# Patient Record
Sex: Male | Born: 1960 | Race: White | Hispanic: No | Smoking: Current every day smoker
Health system: Southern US, Community
[De-identification: ages and names within clinical notes are randomized; demographics above are authoritative.]

## PROBLEM LIST (undated history)

## (undated) DIAGNOSIS — Z923 Personal history of irradiation: Secondary | ICD-10-CM

## (undated) DIAGNOSIS — S62609A Fracture of unspecified phalanx of unspecified finger, initial encounter for closed fracture: Secondary | ICD-10-CM

## (undated) DIAGNOSIS — E785 Hyperlipidemia, unspecified: Secondary | ICD-10-CM

## (undated) DIAGNOSIS — F209 Schizophrenia, unspecified: Secondary | ICD-10-CM

## (undated) DIAGNOSIS — R569 Unspecified convulsions: Secondary | ICD-10-CM

## (undated) DIAGNOSIS — F319 Bipolar disorder, unspecified: Secondary | ICD-10-CM

## (undated) DIAGNOSIS — I2510 Atherosclerotic heart disease of native coronary artery without angina pectoris: Secondary | ICD-10-CM

## (undated) DIAGNOSIS — I10 Essential (primary) hypertension: Secondary | ICD-10-CM

## (undated) DIAGNOSIS — F329 Major depressive disorder, single episode, unspecified: Secondary | ICD-10-CM

## (undated) DIAGNOSIS — F1011 Alcohol abuse, in remission: Secondary | ICD-10-CM

## (undated) DIAGNOSIS — F419 Anxiety disorder, unspecified: Secondary | ICD-10-CM

## (undated) DIAGNOSIS — B1920 Unspecified viral hepatitis C without hepatic coma: Secondary | ICD-10-CM

## (undated) DIAGNOSIS — Z720 Tobacco use: Secondary | ICD-10-CM

## (undated) DIAGNOSIS — I255 Ischemic cardiomyopathy: Secondary | ICD-10-CM

## (undated) DIAGNOSIS — F32A Depression, unspecified: Secondary | ICD-10-CM

## (undated) DIAGNOSIS — I739 Peripheral vascular disease, unspecified: Secondary | ICD-10-CM

## (undated) DIAGNOSIS — R350 Frequency of micturition: Secondary | ICD-10-CM

## (undated) DIAGNOSIS — I219 Acute myocardial infarction, unspecified: Secondary | ICD-10-CM

## (undated) DIAGNOSIS — K219 Gastro-esophageal reflux disease without esophagitis: Secondary | ICD-10-CM

## (undated) HISTORY — PX: BACK SURGERY: SHX140

## (undated) HISTORY — DX: Ischemic cardiomyopathy: I25.5

## (undated) HISTORY — DX: Hyperlipidemia, unspecified: E78.5

## (undated) HISTORY — DX: Atherosclerotic heart disease of native coronary artery without angina pectoris: I25.10

## (undated) HISTORY — PX: CARDIAC CATHETERIZATION: SHX172

## (undated) HISTORY — DX: Fracture of unspecified phalanx of unspecified finger, initial encounter for closed fracture: S62.609A

## (undated) HISTORY — DX: Peripheral vascular disease, unspecified: I73.9

## (undated) HISTORY — DX: Essential (primary) hypertension: I10

## (undated) HISTORY — PX: SUBDURAL HEMATOMA EVACUATION VIA CRANIOTOMY: SUR319

## (undated) HISTORY — PX: HERNIA REPAIR: SHX51

## (undated) NOTE — *Deleted (*Deleted)
[name] presents for follow up of radiation to his Larynx completed on 01/24/2018  Pain issues, if any: *** Using a feeding tube?: N/A Weight changes, if any:  **3 weight** Swallowing issues, if any: *** Smoking or chewing tobacco? *** Using fluoride trays daily? *** Last ENT visit was on: 02/02/2020 Saw Dr. Osborn Coho: "Impression & Plans:  Patient presents for follow-up evaluation and laryngoscopy. 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 Patient has been stable. Flexible laryngoscopy performed today shows normal bilateral vocal cord mobility, no evidence of nodule, mass or tumor. Mild diffuse erythema of the larynx. No evidence of recurrent carcinoma. Patient stable, 2 years out from radiation therapy. Recommend 1 year follow-up for routine recheck. We reviewed symptoms and findings associated with recurrent cancer and the patient will monitor clinically. Plan follow-up with Dr. Basilio Cairo as scheduled."  Other notable issues, if any: ***  **vitals**

---

## 1987-12-05 HISTORY — PX: BRAIN SURGERY: SHX531

## 2004-01-24 ENCOUNTER — Other Ambulatory Visit: Payer: Self-pay

## 2004-02-01 ENCOUNTER — Other Ambulatory Visit: Payer: Self-pay

## 2004-02-13 ENCOUNTER — Other Ambulatory Visit: Payer: Self-pay

## 2004-05-01 ENCOUNTER — Other Ambulatory Visit: Payer: Self-pay

## 2004-05-14 ENCOUNTER — Other Ambulatory Visit: Payer: Self-pay

## 2004-05-23 ENCOUNTER — Other Ambulatory Visit: Payer: Self-pay

## 2004-06-13 ENCOUNTER — Other Ambulatory Visit: Payer: Self-pay

## 2004-07-18 ENCOUNTER — Other Ambulatory Visit: Payer: Self-pay

## 2004-07-29 ENCOUNTER — Other Ambulatory Visit: Payer: Self-pay

## 2004-07-30 ENCOUNTER — Other Ambulatory Visit: Payer: Self-pay

## 2004-07-31 ENCOUNTER — Other Ambulatory Visit: Payer: Self-pay

## 2004-08-01 ENCOUNTER — Other Ambulatory Visit: Payer: Self-pay

## 2006-08-10 ENCOUNTER — Inpatient Hospital Stay (HOSPITAL_COMMUNITY): Admission: RE | Admit: 2006-08-10 | Discharge: 2006-08-14 | Payer: Self-pay | Admitting: Cardiology

## 2006-09-25 ENCOUNTER — Other Ambulatory Visit: Payer: Self-pay

## 2006-09-26 ENCOUNTER — Observation Stay: Payer: Self-pay | Admitting: Internal Medicine

## 2006-11-04 ENCOUNTER — Inpatient Hospital Stay: Payer: Self-pay | Admitting: Internal Medicine

## 2006-11-04 ENCOUNTER — Other Ambulatory Visit: Payer: Self-pay

## 2006-11-18 ENCOUNTER — Other Ambulatory Visit: Payer: Self-pay

## 2006-11-19 ENCOUNTER — Inpatient Hospital Stay: Payer: Self-pay | Admitting: Internal Medicine

## 2007-03-05 ENCOUNTER — Inpatient Hospital Stay (HOSPITAL_COMMUNITY): Admission: RE | Admit: 2007-03-05 | Discharge: 2007-03-06 | Payer: Self-pay | Admitting: Cardiovascular Disease

## 2007-03-05 HISTORY — PX: CORONARY ANGIOPLASTY WITH STENT PLACEMENT: SHX49

## 2007-04-27 ENCOUNTER — Other Ambulatory Visit: Payer: Self-pay

## 2007-04-27 ENCOUNTER — Inpatient Hospital Stay: Payer: Self-pay | Admitting: Internal Medicine

## 2007-04-28 ENCOUNTER — Other Ambulatory Visit: Payer: Self-pay

## 2007-07-17 ENCOUNTER — Ambulatory Visit: Payer: Self-pay

## 2007-07-31 ENCOUNTER — Other Ambulatory Visit: Payer: Self-pay

## 2007-08-01 ENCOUNTER — Observation Stay: Payer: Self-pay | Admitting: Internal Medicine

## 2007-08-16 ENCOUNTER — Ambulatory Visit: Payer: Self-pay | Admitting: Family Medicine

## 2007-09-19 ENCOUNTER — Ambulatory Visit (HOSPITAL_COMMUNITY): Admission: RE | Admit: 2007-09-19 | Discharge: 2007-09-19 | Payer: Self-pay | Admitting: Cardiovascular Disease

## 2007-09-24 ENCOUNTER — Observation Stay (HOSPITAL_COMMUNITY): Admission: RE | Admit: 2007-09-24 | Discharge: 2007-09-25 | Payer: Self-pay | Admitting: Cardiovascular Disease

## 2007-10-18 ENCOUNTER — Emergency Department: Payer: Self-pay | Admitting: Emergency Medicine

## 2007-10-18 ENCOUNTER — Other Ambulatory Visit: Payer: Self-pay

## 2008-01-02 ENCOUNTER — Other Ambulatory Visit: Payer: Self-pay

## 2008-01-02 ENCOUNTER — Emergency Department: Payer: Self-pay | Admitting: Emergency Medicine

## 2008-04-15 ENCOUNTER — Other Ambulatory Visit: Payer: Self-pay

## 2008-04-15 ENCOUNTER — Emergency Department: Payer: Self-pay | Admitting: Emergency Medicine

## 2009-04-14 ENCOUNTER — Encounter: Admission: RE | Admit: 2009-04-14 | Discharge: 2009-04-14 | Payer: Self-pay | Admitting: Cardiovascular Disease

## 2009-04-20 ENCOUNTER — Ambulatory Visit (HOSPITAL_COMMUNITY): Admission: RE | Admit: 2009-04-20 | Discharge: 2009-04-21 | Payer: Self-pay | Admitting: Cardiovascular Disease

## 2009-11-22 ENCOUNTER — Encounter: Payer: Self-pay | Admitting: Internal Medicine

## 2009-12-04 ENCOUNTER — Encounter: Payer: Self-pay | Admitting: Internal Medicine

## 2009-12-13 ENCOUNTER — Encounter: Payer: Self-pay | Admitting: Cardiovascular Disease

## 2010-01-04 ENCOUNTER — Encounter: Payer: Self-pay | Admitting: Internal Medicine

## 2010-03-18 ENCOUNTER — Encounter: Payer: Self-pay | Admitting: Cardiovascular Disease

## 2010-03-28 ENCOUNTER — Encounter: Payer: Self-pay | Admitting: Cardiovascular Disease

## 2010-05-06 ENCOUNTER — Ambulatory Visit: Payer: Self-pay | Admitting: Cardiovascular Disease

## 2010-05-06 ENCOUNTER — Ambulatory Visit: Payer: Self-pay | Admitting: Internal Medicine

## 2010-05-06 DIAGNOSIS — I2510 Atherosclerotic heart disease of native coronary artery without angina pectoris: Secondary | ICD-10-CM | POA: Insufficient documentation

## 2010-05-06 DIAGNOSIS — I658 Occlusion and stenosis of other precerebral arteries: Secondary | ICD-10-CM | POA: Insufficient documentation

## 2010-05-06 DIAGNOSIS — F17200 Nicotine dependence, unspecified, uncomplicated: Secondary | ICD-10-CM | POA: Insufficient documentation

## 2010-05-06 DIAGNOSIS — E785 Hyperlipidemia, unspecified: Secondary | ICD-10-CM | POA: Insufficient documentation

## 2010-05-09 ENCOUNTER — Encounter: Payer: Self-pay | Admitting: Cardiovascular Disease

## 2010-05-11 ENCOUNTER — Ambulatory Visit: Payer: Self-pay | Admitting: Gastroenterology

## 2010-05-13 ENCOUNTER — Ambulatory Visit: Payer: Self-pay | Admitting: Internal Medicine

## 2010-06-03 ENCOUNTER — Ambulatory Visit (HOSPITAL_COMMUNITY): Admission: RE | Admit: 2010-06-03 | Discharge: 2010-06-04 | Payer: Self-pay | Admitting: Neurosurgery

## 2010-06-15 ENCOUNTER — Ambulatory Visit: Payer: Self-pay | Admitting: Cardiovascular Disease

## 2010-06-15 DIAGNOSIS — R0602 Shortness of breath: Secondary | ICD-10-CM | POA: Insufficient documentation

## 2010-06-16 ENCOUNTER — Ambulatory Visit: Payer: Self-pay | Admitting: Vascular Surgery

## 2010-06-16 ENCOUNTER — Encounter: Payer: Self-pay | Admitting: Cardiovascular Disease

## 2010-08-10 ENCOUNTER — Ambulatory Visit: Payer: Self-pay | Admitting: Gastroenterology

## 2010-10-13 ENCOUNTER — Ambulatory Visit: Payer: Self-pay | Admitting: Neurosurgery

## 2010-11-24 ENCOUNTER — Ambulatory Visit: Payer: Self-pay | Admitting: Neurosurgery

## 2010-12-04 HISTORY — PX: POSTERIOR FUSION CERVICAL SPINE: SUR628

## 2010-12-19 ENCOUNTER — Encounter: Payer: Self-pay | Admitting: Cardiovascular Disease

## 2010-12-19 ENCOUNTER — Ambulatory Visit
Admission: RE | Admit: 2010-12-19 | Discharge: 2010-12-19 | Payer: Self-pay | Source: Home / Self Care | Attending: Cardiovascular Disease | Admitting: Cardiovascular Disease

## 2010-12-19 DIAGNOSIS — R0989 Other specified symptoms and signs involving the circulatory and respiratory systems: Secondary | ICD-10-CM | POA: Insufficient documentation

## 2010-12-21 ENCOUNTER — Ambulatory Visit (HOSPITAL_COMMUNITY)
Admission: RE | Admit: 2010-12-21 | Discharge: 2010-12-22 | Payer: Self-pay | Source: Home / Self Care | Attending: Neurosurgery | Admitting: Neurosurgery

## 2010-12-21 LAB — CBC
HCT: 46.4 % (ref 39.0–52.0)
Hemoglobin: 15.8 g/dL (ref 13.0–17.0)
MCH: 31.6 pg (ref 26.0–34.0)
MCHC: 34.1 g/dL (ref 30.0–36.0)
MCV: 92.8 fL (ref 78.0–100.0)
Platelets: 161 10*3/uL (ref 150–400)
RBC: 5 MIL/uL (ref 4.22–5.81)
RDW: 13.8 % (ref 11.5–15.5)
WBC: 8.4 10*3/uL (ref 4.0–10.5)

## 2010-12-21 LAB — COMPREHENSIVE METABOLIC PANEL
ALT: 33 U/L (ref 0–53)
AST: 29 U/L (ref 0–37)
Albumin: 4 g/dL (ref 3.5–5.2)
Alkaline Phosphatase: 53 U/L (ref 39–117)
BUN: 10 mg/dL (ref 6–23)
CO2: 29 mEq/L (ref 19–32)
Calcium: 9.7 mg/dL (ref 8.4–10.5)
Chloride: 105 mEq/L (ref 96–112)
Creatinine, Ser: 0.99 mg/dL (ref 0.4–1.5)
GFR calc Af Amer: >60 mL/min (ref >60)
GFR calc non Af Amer: >60 mL/min (ref >60)
Glucose, Bld: 84 mg/dL (ref 70–99)
Potassium: 4.5 mEq/L (ref 3.5–5.1)
Sodium: 141 mEq/L (ref 135–145)
Total Bilirubin: 0.8 mg/dL (ref 0.3–1.2)
Total Protein: 6.9 g/dL (ref 6.0–8.3)

## 2010-12-21 LAB — SURGICAL PCR SCREEN
MRSA, PCR: NEGATIVE
Staphylococcus aureus: NEGATIVE

## 2010-12-26 LAB — APTT: aPTT: 28 seconds (ref 24–37)

## 2010-12-26 LAB — PROTIME-INR
INR: 0.91 (ref 0.00–1.49)
Prothrombin Time: 12.5 s (ref 11.6–15.2)

## 2011-01-04 HISTORY — PX: ANTERIOR CERVICAL DECOMP/DISCECTOMY FUSION: SHX1161

## 2011-01-05 NOTE — Assessment & Plan Note (Signed)
Summary: F/U 6 MONTH   Visit Type:  Follow-up Primary Provider:  Dr. Darrick Huntsman  CC:  C/o chest pain and SOB. pt having back surgery tomorrow at [facility].Marland Kitchen  History of Present Illness: 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   he reports that he is doing well and his scheduled to have back surgery this week. He has already stopped his aspirin and Plavix. He denies any claudication type symptoms in his legs. He does have rare episodes of chest discomfort that have been chronic, lasting 20 minutes to 30 minutes, 4/10 in intensity, sometimes takes nitroglycerin. There's been no change over the past 2 years in his chest discomfort which has been chronic. Previous stress test showed region of scar but no ischemia.  Notes from  January 2011 suggest ABIs of 0.9 bilaterally suggesting patent stents of his iliac arteries.  EKG shows normal sinus rhythm with a rate 57 beats per minute, T-wave abnormality in V4 through V6, one and aVL  Stress test January 2010 shows moderate to severe perfusion defect consistent with scar with mild peri-infarct ischemia in the inferior wall as well as inferolateral region. This was unchanged from previous stress test in October 2008  Cardiac catheterization May 2010 showing occluded left circumflex at the ostium, right coronary artery was dominant with 30% in-stent restenosis of the mid RCA stent. LAD was normal.  Current Medications (verified): 1)  Depakote 500 Mg Tbec (Divalproex Sodium) .... Take 1 Tablet By Mouth 4 Times A Daily 2)  Lithium Carbonate 450 Mg Cr-Tabs (Lithium  Carbonate) .... Take 1 Tablet By Mouth Two Times A Day 3)  Alprazolam 1 Mg Tabs (Alprazolam) .... Take 1 Tablet By Mouth Four Times Daily 4)  Celexa 40 Mg Tabs (Citalopram Hydrobromide) .... Take 1 Tablet By Mouth Once Daily 5)  Plavix 75 Mg Tabs (Clopidogrel Bisulfate) .... Take 1 Tablet By Mouth Once Daily 6)  Amlodipine Besylate 5 Mg Tabs (Amlodipine Besylate) .... Take 1 Tablet By Mouth Once Daily 7)  Protonix 40 Mg Tbec (Pantoprazole Sodium) .... Take 1 Tablet By Mouth Once Daily 8)  Adderall 30 Mg Tabs (Amphetamine-Dextroamphetamine) .... Once Daily 9)  Crestor 10 Mg Tabs (Rosuvastatin Calcium) .... Take One Tablet By Mouth Daily. 10)  Seroquel 400 Mg Tabs (Quetiapine Fumarate) .Marland Kitchen.. 1 Tablet At Bedtime  Allergies (verified): No Known Drug Allergies  Past History:  Past Medical History: Last updated: 05/06/2010 CAD PAD HTN Hyperlipidemia  Past Surgical History: Last updated: 05/06/2010 stent--12 subdural hematoma  Family History: Last updated: 05/06/2010 emphysema-Father Family History of Alcoholism:  Family History of Diabetes:  Family History of Hyperlipidemia:  Family History of Hypertension:  Family History of Seizure Disorder:   Social History: Last updated: 05/06/2010 Disabled --17 years Bipolar/ADHD Divorced -- Tobacco Use - Yes.  Alcohol Use - no Regular Exercise - yes Drug Use - no  Risk Factors: Exercise: yes (05/06/2010)  Risk Factors: Smoking Status: current (05/06/2010)  Review of Systems       The patient complains of chest pain.  The patient denies fever, weight loss, weight gain, vision loss, decreased hearing, hoarseness, syncope, dyspnea on exertion, peripheral edema,  prolonged cough, abdominal pain, incontinence, muscle weakness, depression, and enlarged lymph nodes.    Vital Signs:  Patient profile:   50 year old male Height:      67 inches Weight:      167 pounds BMI:     26.25 Pulse rate:   57 / minute BP sitting:   134 / 84   (left arm) Cuff size:   regular  Vitals Entered By: Lysbeth Galas CMA (December 19, 2010 11:11 AM)  Physical Exam  General:  Well developed, well nourished, in no acute distress. Head:  normocephalic and atraumatic Neck:  Neck supple, no JVD. No masses, thyromegaly or abnormal cervical nodes. Lungs:  mildly decreased breath sounds throughout bilaterally Heart:  Non-displaced PMI, chest non-tender; regular rate and rhythm, S1, S2 without murmurs, rubs or gallops. Carotid upstroke normal, no bruit.  Pedals pulses were nonpalpable. No edema, no varicosities. Abdomen:  Bowel sounds positive; abdomen soft and non-tender without masses Msk:  Back normal, normal gait. Muscle strength and tone normal. Pulses:  decreased  pulses of the lower extremities, DP and PT Extremities:  No clubbing or cyanosis. Neurologic:  Alert and oriented x 3. Skin:  Intact without lesions or rashes. Psych:  Normal affect. appears to be somewhat shaky.    Impression & Recommendations:  Problem # 1:  CAD, NATIVE VESSEL (ICD-414.01) severe native coronary artery disease with chronic chest pain. We'll continue to treat medically at this time. We've asked him to contact us if his chest pain gets more frequent or more intense.  The following medications were removed from the medication list:    Aspirin 325 Mg Tabs (Aspirin) .Marland Kitchen... Take 1 tablet by mouth once daily His updated medication list for this problem includes:    Plavix 75 Mg Tabs (Clopidogrel bisulfate) .Marland Kitchen... Take 1 tablet by mouth once daily    Amlodipine Besylate 5 Mg Tabs (Amlodipine besylate) .Marland Kitchen... Take 1 tablet by mouth once daily    Aspirin 81 Mg Tbec (Aspirin) .Marland Kitchen... Take one tablet by mouth daily  Orders: EKG w/ Interpretation (93000)  Problem # 2:  TOBACCO USER (ICD-305.1) Continues to smoke one pack per day. We have counseled him on smoking cessation. He'll continue to try  Problem # 3:  HYPERLIPIDEMIA-MIXED (ICD-272.4) continue Crestor 10 mg  daily. We will check his cholesterol after his back surgery in March.  His updated medication list for this problem includes:    Crestor 10 Mg Tabs (Rosuvastatin calcium) .Marland Kitchen... Take one tablet by mouth daily.  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 We'll set him up for a ultrasound to evaluate his stent patency, has moderate renal arterial disease, carotid bruit. Continue aspirin Plavix and cholesterol management. Smoking cessation.  The following medications were removed from the medication list:    Aspirin 325 Mg Tabs (Aspirin) .Marland Kitchen... Take 1 tablet by mouth once daily His updated medication list for this problem includes:    Plavix 75 Mg Tabs (Clopidogrel bisulfate) .Marland Kitchen... Take 1 tablet by mouth once daily    Aspirin 81 Mg Tbec (Aspirin) .Marland Kitchen... Take one tablet by mouth daily  Other Orders: Carotid Duplex (Carotid Duplex) Arterial Duplex Lower Extremity (Arterial Duplex Low) Renal Artery Duplex (Renal Artery Duplex)  Patient Instructions: 1)  Your physician recommends that you schedule a follow-up appointment in: 6 months 2)  Your physician has recommended you make the following change in your medication: START Aspirin 81mg  once  daily. 3)  To be scheduled 02/2011: Your physician has requested that you have a carotid duplex. This test is an ultrasound of the carotid arteries in your neck. It looks at blood flow through these arteries that supply the brain with blood. Allow one hour for this exam. There are no restrictions or special instructions. 4)  To be scheduled 02/2011:  Your physician has requested that you have a lower arterial duplex.  This test is an ultrasound of the arteries in the legs or arms.  It looks at arterial blood flow in the legs and arms.  Allow one hour for Lower and Upper Arterial scans. There are no restrictions or special instructions. 5)  To be scheduled 02/2011:   Your physician has requested that you have a renal artery duplex. During this test, an ultrasound is used to evaluate blood flow to the kidneys. Allow one hour for this exam. Do not eat after midnight the day before and avoid carbonated beverages. Take your medications as you usually do. Prescriptions: PROTONIX 40 MG TBEC (PANTOPRAZOLE SODIUM) Take 1 tablet by mouth once daily  #90 x 3   Entered by:   Lanny Hurst RN   Authorized by:   Dossie Arbour MD   Signed by:   Lanny Hurst RN on 12/19/2010   Method used:   Electronically to        [facility] [phone]* (retail)       [address]       [place], [place]  [address]       Ph: [phone]       Fax: [phone]   RxID:   [id number]

## 2011-01-05 NOTE — Assessment & Plan Note (Signed)
Summary: NP6   Visit Type:  Initial Consult   History of Present Illness: [name] is a 50 year old gentleman with a history of coronary artery disease, stent to his RCA in April 2008 with occluded circumflex, bilateral iliac stenting with last stent to his left iliac in May 2010, mild to moderate renal artery stenosis on the left, history of hepatitis C, long alcohol history currently in remission, lipoma disorder who continues to smoke, hypertension and hyperlipidemia who presents to establish care.  He states that he has had significant right hip and right low back discomfort. It hurts more when he walks. He is worried about a blockage in his leg. Notes from Dr. Allyson Sabal January 2011 suggest ABIs of 0.9 bilaterally suggesting patent stents of his iliac arteries.  EKG dated January 03, 2010 shows normal sinus rhythm, rate 72 beats per minute, nonspecific T wave abnormality in V5, V6.  Stress test January 2010 shows moderate to severe perfusion defect consistent with scar with mild peri-infarct ischemia in the inferior wall as well as inferolateral region. This was unchanged from previous stress test in October 2008  Cardiac catheterization May 2010 showing occluded left circumflex at the ostium, right coronary artery was dominant with 30% in-stent restenosis of the mid RCA stent. LAD was normal.  Preventive Screening-Counseling & Management  Alcohol-Tobacco     Smoking Status: current  Caffeine-Diet-Exercise     Does Patient Exercise: yes      Drug Use:  no.    Current Medications (verified): 1)  Depakote 500 Mg Tbec (Divalproex Sodium) .... Take 1 Tablet By Mouth 4 Times A Daily 2)  Lithium Carbonate 450 Mg Cr-Tabs (Lithium Carbonate) .... Take 1 Tablet By Mouth Two Times A Day 3)  Ambien 10 Mg Tabs (Zolpidem Tartrate) .... Take 1 Tablet By Mouth At Bedtime As Needed 4)  Alprazolam 1 Mg Tabs (Alprazolam) .... Take 1 Tablet By Mouth Four Times Daily 5)  Celexa 40 Mg Tabs  (Citalopram Hydrobromide) .... Take 1 Tablet By Mouth Once Daily 6)  K-Tabs 10 Meq Cr-Tabs (Potassium Chloride) .... Take 1 Tablet By Mouth Once Daily 7)  Plavix 75 Mg Tabs (Clopidogrel Bisulfate) .... Take 1 Tablet By Mouth Once Daily 8)  Aspirin 325 Mg Tabs (Aspirin) .... Take 1 Tablet By Mouth Once Daily 9)  Amlodipine Besylate 5 Mg Tabs (Amlodipine Besylate) .... Take 1 Tablet By Mouth Once Daily 10)  Protonix 40 Mg Tbec (Pantoprazole Sodium) .... Take 1 Tablet By Mouth Once Daily 11)  Adderall 30 Mg Tabs (Amphetamine-Dextroamphetamine) .... Once Daily 12)  Fish Oil 500 Mg Caps (Omega-3 Fatty Acids) .... Three Times A Day 13)  Vitamin D 1000 Unit  Tabs (Cholecalciferol) 14)  Crestor 10 Mg Tabs (Rosuvastatin Calcium) .... Take One Tablet By Mouth Daily.  Allergies (verified): No Known Drug Allergies  Past History:  Past Medical History: CAD PAD HTN Hyperlipidemia  Past Surgical History: stent--12 subdural hematoma  Family History: emphysema-Father Family History of Alcoholism:  Family History of Diabetes:  Family History of Hyperlipidemia:  Family History of Hypertension:  Family History of Seizure Disorder:   Social History: Disabled --17 years Bipolar/ADHD Divorced -- Tobacco Use - Yes.  Alcohol Use - no Regular Exercise - yes Drug Use - no Smoking Status:  current Does Patient Exercise:  yes Drug Use:  no  Review of Systems       The patient complains of difficulty walking.  The patient denies fever, weight loss, weight gain, vision loss, decreased hearing, hoarseness,  chest pain, syncope, dyspnea on exertion, peripheral edema, prolonged cough, abdominal pain, incontinence, muscle weakness, depression, and enlarged lymph nodes.         Right hip and low back pain  Vital Signs:  Patient profile:   50 year old male Height:      67 inches Weight:      161 pounds BMI:     25.31 Pulse rate:   60 / minute BP sitting:   122 / 79  (left arm) Cuff size:    regular  Vitals Entered By: Bishop Dublin, CMA (May 06, 2010 2:42 PM)  Physical Exam  General:  Well developed, well nourished, in no acute distress. Head:  normocephalic and atraumatic Neck:  Neck supple, no JVD. No masses, thyromegaly or abnormal cervical nodes. Chest Wall:  no deformities or breast masses noted Lungs:  Clear bilaterally to auscultation and percussion. Heart:  Non-displaced PMI, chest non-tender; regular rate and rhythm, S1, S2 without murmurs, rubs or gallops. Carotid upstroke normal, no bruit. Normal abdominal aortic size, no bruits. Femorals normal pulses, no bruits. Pedals normal pulses. No edema, no varicosities. Abdomen:  Bowel sounds positive; abdomen soft and non-tender without masses, organomegaly, or hernias noted. No hepatosplenomegaly. Msk:  Back normal, normal gait. Muscle strength and tone normal. Pulses:  pulses normal in all 4 extremities Extremities:  No clubbing or cyanosis. Neurologic:  Alert and oriented x 3. Skin:  Intact without lesions or rashes. Psych:  Normal affect.   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 He also has mild to moderate renal artery stenosis. He is back on Crestor for lipid management as well as aspirin and Plavix.  He does have pain in his right hip though this seems more consistent with arthritic pain or sciatica.  His updated medication list for this problem includes:    Plavix 75 Mg Tabs (Clopidogrel bisulfate) .Marland Kitchen... Take 1 tablet by mouth once daily    Aspirin 325 Mg Tabs (Aspirin) .Marland Kitchen... Take 1 tablet by mouth once daily  His updated medication list for this problem includes:    Plavix 75 Mg Tabs (Clopidogrel bisulfate) .Marland Kitchen... Take 1 tablet by mouth once daily    Aspirin 325 Mg Tabs (Aspirin) .Marland Kitchen... Take 1 tablet by  mouth once daily  Problem # 2:  TOBACCO USER (ICD-305.1) We have counseled him for more than 5 minutes on smoking cessation as this will accelerate his peripheral vascular disease as well as his coronary artery disease. He has tried patches as well as other modalities.  Problem # 3:  CAD, NATIVE VESSEL (ICD-414.01) last stress test in 2010 showing no significant ischemia. He denies any significant chest pain though in the past he has had chronic episodes of chest discomfort.  His updated medication list for this problem includes:    Plavix 75 Mg Tabs (Clopidogrel bisulfate) .Marland Kitchen... Take 1 tablet by mouth once daily    Aspirin 325 Mg Tabs (Aspirin) .Marland Kitchen... Take 1 tablet by mouth once daily    Amlodipine Besylate 5 Mg Tabs (Amlodipine besylate) .Marland Kitchen... Take 1 tablet by mouth once daily  Problem # 4:  HYPERLIPIDEMIA-MIXED (ICD-272.4) He has a history of alcohol abuse, hepatitis C. We'll try to obtain his most recent lipid panel and LFTs. I asked him to talk with Dr. to low about whether he might be a candidate for  treatment for hepatitis C. There is a new medication out this year that he could potentially benefit from with a shorter treatment duration.  His updated medication list for this problem includes:    Crestor 10 Mg Tabs (Rosuvastatin calcium) .Marland Kitchen... Take one tablet by mouth daily.  Patient Instructions: 1)  Your physician wants you to follow-up in:   6 months  You will receive a reminder letter in the mail two months in advance. If you don't receive a letter, please call our office to schedule the follow-up appointment. 2)  Your physician recommends that you continue on your current medications as directed. Please refer to the Current Medication list given to you today.  Appended Document: NP6 labs from April 2011 shows total cholesterol 145, HDL 43, LDL 77 creatinine 1.1

## 2011-01-05 NOTE — Assessment & Plan Note (Signed)
Summary: ROV   Visit Type:  Follow-up Primary Provider:  Dr. Darrick Huntsman  CC:  Had back surg. on July 1 and 2011.  Has a burning sensation in left foot with discoloration in toes. Has some shortness of breath..  History of Present Illness: 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  He reports that 5 days ago, he acutely developed discomfort in 3 of his toes. This was on Saturday. On Sunday, his large toe also developed pain. He noticed discoloration of the distal part of the toe and he believes they felt cold. He also has had some shortness of breath. He had recent neck surgery 12 days ago and he reports that they gave him some IV fluids at the time. Since that time his breathing has been more labored. He has chronic right hip and right low back discomfort. It hurts more when he walks.   Notes from Dr. Allyson Sabal January 2011 suggest ABIs of 0.9 bilaterally suggesting patent stents of his iliac arteries.  EKG dated January 03, 2010 shows normal sinus rhythm, rate 72 beats per minute, nonspecific T wave abnormality in V5, V6.  Stress test January 2010 shows moderate to severe perfusion defect consistent with scar with mild peri-infarct ischemia in the inferior wall as well as inferolateral region. This was unchanged from previous stress test in October 2008  Cardiac catheterization May 2010 showing occluded left circumflex at the ostium, right coronary artery was dominant with 30% in-stent restenosis of the mid RCA stent. LAD was normal.  Current Medications (verified): 1)  Depakote 500 Mg Tbec (Divalproex Sodium)  .... Take 1 Tablet By Mouth 4 Times A Daily 2)  Lithium Carbonate 450 Mg Cr-Tabs (Lithium Carbonate) .... Take 1 Tablet By Mouth Two Times A Day 3)  Ambien 10 Mg Tabs (Zolpidem Tartrate) .... Take 1 Tablet By Mouth At Bedtime As Needed 4)  Alprazolam 1 Mg Tabs (Alprazolam) .... Take 1 Tablet By Mouth Four Times Daily 5)  Celexa 40 Mg Tabs (Citalopram Hydrobromide) .... Take 1 Tablet By Mouth Once Daily 6)  K-Tabs 10 Meq Cr-Tabs (Potassium Chloride) .... Take 1 Tablet By Mouth Once Daily 7)  Plavix 75 Mg Tabs (Clopidogrel Bisulfate) .... Take 1 Tablet By Mouth Once Daily 8)  Aspirin 325 Mg Tabs (Aspirin) .... Take 1 Tablet By Mouth Once Daily 9)  Amlodipine Besylate 5 Mg Tabs (Amlodipine Besylate) .... Take 1 Tablet By Mouth Once Daily 10)  Protonix 40 Mg Tbec (Pantoprazole Sodium) .... Take 1 Tablet By Mouth Once Daily 11)  Adderall 30 Mg Tabs (Amphetamine-Dextroamphetamine) .... Once Daily 12)  Fish Oil 500 Mg Caps (Omega-3 Fatty Acids) .... Three Times A Day 13)  Vitamin D 1000 Unit  Tabs (Cholecalciferol) 14)  Crestor 10 Mg Tabs (Rosuvastatin Calcium) .... Take One Tablet By Mouth Daily. 15)  Valium 5 Mg Tabs (Diazepam) .Marland Kitchen.. 1 Tablet Bid 16)  Hydrocodone-Acetaminophen 7.5-500 Mg Tabs (Hydrocodone-Acetaminophen) .Marland Kitchen.. 1 Tablet Every 4-6 Hours As Needed  Allergies (verified): No Known Drug Allergies  Past History:  Past Medical History: Last updated: 05/06/2010 CAD PAD HTN Hyperlipidemia  Past Surgical History: Last updated: 05/06/2010 stent--12 subdural hematoma  Family History: Last updated: 05/06/2010 emphysema-Father Family History of Alcoholism:  Family History of Diabetes:  Family History of Hyperlipidemia:  Family History of Hypertension:  Family History of Seizure Disorder:   Social History: Last updated: 05/06/2010 Disabled --17 years Bipolar/ADHD Divorced -- Tobacco Use - Yes.  Alcohol Use - no Regular Exercise - yes Drug Use - no  Risk  Factors: Exercise: yes (05/06/2010)  Risk Factors: Smoking Status: current (05/06/2010)  Review of Systems       The patient complains of dyspnea on exertion.  The patient denies fever, weight loss, weight gain, vision loss, decreased hearing, hoarseness, chest pain, syncope, peripheral edema, prolonged cough, abdominal pain, incontinence, muscle weakness, depression, and enlarged lymph nodes.         "purple cold toes on left foot"  Vital Signs:  Patient profile:   50 year old male Height:      67 inches Weight:      157 pounds BMI:     24.68 Pulse rate:   63 / minute BP sitting:   122 / 80  (left arm) Cuff size:   regular  Vitals Entered By: Bishop Dublin, CMA (June 15, 2010 11:28 AM)  Physical Exam  General:  Well developed, well nourished, in no acute distress. Head:  normocephalic and atraumatic Neck:  Neck supple, no JVD. No masses, thyromegaly or abnormal cervical nodes. Lungs:  mildly decreased breath sounds throughout bilaterally Heart:  Non-displaced PMI, chest non-tender; regular rate and rhythm, S1, S2 without murmurs, rubs or gallops. Carotid upstroke normal, no bruit.  Pedals pulses were nonpalpable. No edema, no varicosities. Abdomen:  Bowel sounds positive; abdomen soft and non-tender without masses Msk:  Back normal, normal gait. Muscle strength and tone normal. Pulses:  decreased nonpalpable pulses of the lower extremities, DP and PT Extremities:  No clubbing. cyanosis noted of the first, third fourth fifth toes on the left the distal part of the toes.  Neurologic:  Alert and oriented x 3. Skin:  Intact without lesions or rashes. Psych:  Normal affect.   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 Acute onset 4-5 days ago with worsening pain. He did have neck surgery close to 2 weeks ago by suspect this is unrelated. My  suspicion is he has embolic occlusion of his toes but cannot rule out stent stenosis. He will be seen today at [facility] with an ultrasound followed by evaluation by Dr. Lorretta Harp.  He may require admission to the hospital with IV heparin.  His updated medication list for this problem includes:    Plavix 75 Mg Tabs (Clopidogrel bisulfate) .Marland Kitchen... Take 1 tablet by mouth once daily    Aspirin 325 Mg Tabs (Aspirin) .Marland Kitchen... Take 1 tablet by mouth once daily  Problem # 2:  DYSPNEA (ICD-786.05) I suspect that his shortness of breath is due to some fluid overload from his recent neck surgery. We will start him on Lasix to be taken p.r.n. until his bleeding improves.  His updated medication list for this problem includes:    Aspirin 325 Mg Tabs (Aspirin) .Marland Kitchen... Take 1 tablet by mouth once daily    Amlodipine Besylate 5 Mg Tabs (Amlodipine besylate) .Marland Kitchen... Take 1 tablet by mouth once daily  Problem # 3:  HYPERLIPIDEMIA-MIXED (ICD-272.4) We will continue him on the Crestor, aspirin, plavix  His updated medication list for this  problem includes:    Crestor 10 Mg Tabs (Rosuvastatin calcium) .Marland Kitchen... Take one tablet by mouth daily.  Other Orders: EKG w/ Interpretation (93000)  Patient Instructions: 1)  You are being referred to [facility] today. 2)  Your physician wants you to follow-up in: 6 months.   You will receive a reminder letter in the mail two months in advance. If you don't receive a letter, please call our office to schedule the follow-up appointment.  Appended Document: ROV EKG shows normal sinus rhythm with rate 63 beats per minute, nonspecific T-wave changes in V4 through V6, 2, 3 aVF

## 2011-01-05 NOTE — Miscellaneous (Signed)
Summary: Furosemide  Clinical Lists Changes  Medications: Added new medication of FUROSEMIDE 20 MG TABS (FUROSEMIDE) Take one tablet by mouth daily. - Signed Rx of FUROSEMIDE 20 MG TABS (FUROSEMIDE) Take one tablet by mouth daily.;  #30 x 1;  Signed;  Entered by: Sherri Rad, RN, BSN;  Authorized by: Dossie Arbour MD;  Method used: Electronically to [id number] [address] [place], [place], [place]  [address], Ph: [phone], Fax: [phone]    Prescriptions: FUROSEMIDE 20 MG TABS (FUROSEMIDE) Take one tablet by mouth daily.  #30 x 1   Entered by:   Sherri Rad, RN, BSN   Authorized by:   Dossie Arbour MD   Signed by:   Sherri Rad, RN, BSN on 06/15/2010   Method used:   Electronically to        [facility] [phone]* (retail)       [address]       [place], [place]  [address]       Ph: [phone]       Fax: [phone]   RxID:   [id number]

## 2011-01-05 NOTE — Letter (Signed)
Summary: Internal Correspondence  Internal Correspondence   Imported By: West Carbo 05/09/2010 12:19:34  _____________________________________________________________________  External Attachment:    Type:   Image     Comment:   External Document

## 2011-01-05 NOTE — Letter (Signed)
Summary: Medical Record Release  Medical Record Release   Imported By: Harlon Flor 04/13/2010 14:42:43  _____________________________________________________________________  External Attachment:    Type:   Image     Comment:   External Document

## 2011-01-05 NOTE — Op Note (Signed)
Summary: Operative Report  Operative Report   Imported By: West Carbo 06/20/2010 08:16:31  _____________________________________________________________________  External Attachment:    Type:   Image     Comment:   External Document

## 2011-01-05 NOTE — Letter (Signed)
Summary: MR RELEASE  MR RELEASE   Imported By: Frazier Butt Chriscoe 05/09/2010 09:48:27  _____________________________________________________________________  External Attachment:    Type:   Image     Comment:   External Document

## 2011-01-27 NOTE — Op Note (Signed)
  NAMEMONTELL, LEOPARD             ACCOUNT NO.:  [id number]  MEDICAL RECORD NO.:  [id number]          PATIENT TYPE:  OIB  LOCATION:  3021                         FACILITY:  [facility]  PHYSICIAN:  Coletta Memos, M.D.     DATE OF BIRTH:  1961-08-29  DATE OF PROCEDURE:  12/21/2010 DATE OF DISCHARGE:  12/22/2010                              OPERATIVE REPORT   PREOPERATIVE DIAGNOSES: 1. Cervical spondylosis, C6-7. 2. Left C7 radiculopathy.  POSTOPERATIVE DIAGNOSES: 1. Cervical spondylosis, C6-7. 2. Left C7 radiculopathy.  PROCEDURE:  Left C6-7 medial facetectomy and foraminotomy with microdissection.  COMPLICATIONS:  None.  SURGEON:  Coletta Memos, MD  INDICATIONS:  Ms. Kimball is an individual whom I performed an anterior cervical decompression and arthrodesis at C6-7.  He persisted with left upper extremity pain after that operation.  CT shows there is still some narrowing of the foramen on the left side.  I therefore offered and he agreed to undergo operative decompression.  OPERATIVE NOTE:  I took Ms. Maddy to the operating room.  He was positioned after anesthesia was obtained in a three-pin [place] head holder.  His neck was placed in a military tuck position.  I shaved the back of his head and neck, and then he was prepped and draped in sterile fashion.  I opened the skin with a #10 blade and took this down through the deep fascia to the deep cervical fascia to expose the spinous processes of two lamina.  I was able to confirm that that was the C6-7 space.  I then on the left side used a drill to perform a medial facetectomy.  I used also Kerrison punches and some curettes.  I removed the ligamentum flavum.  I exposed the nerve root and then completed a foraminotomy by removing bone overlying the nerve root until I felt that it was free of any and all pressure.  I then irrigated the wound.  I closed the wound in layered fashion using Vicryl sutures.  I used Dermabond  for sterile dressing.          ______________________________ Coletta Memos, M.D.     KC/MEDQ  D:  12/29/2010  T:  12/30/2010  Job:  [id number]  Electronically Signed by Coletta Memos M.D. on 01/27/2011 09:19:34 AM

## 2011-02-19 LAB — CBC
HCT: 47.6 % (ref 39.0–52.0)
Hemoglobin: 16.1 g/dL (ref 13.0–17.0)
MCH: 33.5 pg (ref 26.0–34.0)
MCHC: 33.9 g/dL (ref 30.0–36.0)
MCV: 98.8 fL (ref 78.0–100.0)
Platelets: 130 10*3/uL — ABNORMAL LOW (ref 150–400)
RBC: 4.82 MIL/uL (ref 4.22–5.81)
RDW: 14.9 % (ref 11.5–15.5)
WBC: 8.1 10*3/uL (ref 4.0–10.5)

## 2011-02-19 LAB — HEPATIC FUNCTION PANEL
ALT: 37 U/L (ref 0–53)
AST: 38 U/L — ABNORMAL HIGH (ref 0–37)
Albumin: 3.6 g/dL (ref 3.5–5.2)
Alkaline Phosphatase: 43 U/L (ref 39–117)
Bilirubin, Direct: 0.3 mg/dL (ref 0.0–0.3)
Indirect Bilirubin: 0.5 mg/dL (ref 0.3–0.9)
Total Bilirubin: 0.8 mg/dL (ref 0.3–1.2)
Total Protein: 6.5 g/dL (ref 6.0–8.3)

## 2011-02-19 LAB — PLATELET FUNCTION ASSAY: Collagen / Epinephrine: 171 seconds (ref 0–184)

## 2011-02-19 LAB — BASIC METABOLIC PANEL
BUN: 7 mg/dL (ref 6–23)
CO2: 29 mEq/L (ref 19–32)
Calcium: 9.6 mg/dL (ref 8.4–10.5)
Chloride: 103 mEq/L (ref 96–112)
Creatinine, Ser: 0.93 mg/dL (ref 0.4–1.5)
GFR calc Af Amer: >60 mL/min (ref >60)
GFR calc non Af Amer: >60 mL/min (ref >60)
Glucose, Bld: 103 mg/dL — ABNORMAL HIGH (ref 70–99)
Potassium: 4.5 mEq/L (ref 3.5–5.1)
Sodium: 136 mEq/L (ref 135–145)

## 2011-02-19 LAB — PLATELET INHIBITION P2Y12
P2Y12 % Inhibition: 16 %
Platelet Function  P2Y12: 221 [PRU] (ref 194–418)
Platelet Function Baseline: 262 [PRU] (ref 194–418)

## 2011-02-19 LAB — PROTIME-INR
INR: 0.97 (ref 0.00–1.49)
INR: 0.92 (ref 0.00–1.49)
Prothrombin Time: 12.8 seconds (ref 11.6–15.2)
Prothrombin Time: 12.3 seconds (ref 11.6–15.2)

## 2011-02-19 LAB — DIFFERENTIAL
Basophils Absolute: 0 10*3/uL (ref 0.0–0.1)
Basophils Relative: 0 % (ref 0–1)
Eosinophils Absolute: 0 10*3/uL (ref 0.0–0.7)
Eosinophils Relative: 1 % (ref 0–5)
Lymphocytes Relative: 22 % (ref 12–46)
Lymphs Abs: 1.8 10*3/uL (ref 0.7–4.0)
Monocytes Absolute: 0.6 10*3/uL (ref 0.1–1.0)
Monocytes Relative: 8 % (ref 3–12)
Neutro Abs: 5.6 10*3/uL (ref 1.7–7.7)
Neutrophils Relative %: 69 % (ref 43–77)

## 2011-02-19 LAB — SURGICAL PCR SCREEN
MRSA, PCR: NEGATIVE
Staphylococcus aureus: NEGATIVE

## 2011-02-19 LAB — APTT
aPTT: 28 seconds (ref 24–37)
aPTT: 26 seconds (ref 24–37)

## 2011-03-14 LAB — CBC
HCT: 42.7 % (ref 39.0–52.0)
Hemoglobin: 14.9 g/dL (ref 13.0–17.0)
MCHC: 34.9 g/dL (ref 30.0–36.0)
MCV: 100.8 fL — ABNORMAL HIGH (ref 78.0–100.0)
Platelets: 117 10*3/uL — ABNORMAL LOW (ref 150–400)
RBC: 4.23 MIL/uL (ref 4.22–5.81)
RDW: 13.4 % (ref 11.5–15.5)
WBC: 6.6 10*3/uL (ref 4.0–10.5)

## 2011-03-14 LAB — BASIC METABOLIC PANEL
BUN: 8 mg/dL (ref 6–23)
CO2: 27 mEq/L (ref 19–32)
Calcium: 9.3 mg/dL (ref 8.4–10.5)
Chloride: 109 mEq/L (ref 96–112)
Creatinine, Ser: 0.83 mg/dL (ref 0.4–1.5)
GFR calc Af Amer: >60 mL/min (ref >60)
GFR calc non Af Amer: >60 mL/min (ref >60)
Glucose, Bld: 102 mg/dL — ABNORMAL HIGH (ref 70–99)
Potassium: 4.2 mEq/L (ref 3.5–5.1)
Sodium: 141 mEq/L (ref 135–145)

## 2011-03-20 ENCOUNTER — Other Ambulatory Visit: Payer: Self-pay | Admitting: Emergency Medicine

## 2011-03-20 MED ORDER — CLOPIDOGREL BISULFATE 75 MG PO TABS: 75.0000 mg | ORAL_TABLET | Freq: Every day | ORAL | Status: DC

## 2011-04-11 ENCOUNTER — Encounter: Payer: Self-pay | Admitting: *Deleted

## 2011-04-18 NOTE — Discharge Summary (Signed)
NAMETAYTE, MCWHERTER             ACCOUNT NO.:  [id number]   MEDICAL RECORD NO.:  [id number]          PATIENT TYPE:  INP   LOCATION:  2508                         FACILITY:  [facility]   PHYSICIAN:  Nanetta Batty, M.D.   DATE OF BIRTH:  06-01-1961   DATE OF ADMISSION:  04/20/2009  DATE OF DISCHARGE:  04/21/2009                               DISCHARGE SUMMARY   DISCHARGE DIAGNOSES:  1. Lifestyle-limiting claudication.  2. Peripheral vascular disease with percutaneous transluminal      angioplasty and stent to the left common iliac artery and left      superficial femoral artery.  3. Thrombocytopenia with drop in platelets from 141 prior to procedure      to 117.  4. Sinus bradycardia with heart rate 49-50 at times, questionable      secondary to his psychiatric medications.  5. 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   DISCHARGE CONDITION:  Improved.   PROCEDURES:  PV angiogram by Dr. Nanetta Batty.   PTA and stent to the left CIA and left SFA on Apr 20, 2009.   Also on Apr 20, 2009 right and left heart cath by Dr. Nanetta Batty.   DISCHARGE MEDICATIONS:  1. Amlodipine 10 mg daily.  2. Ambien/zolpidem 10 mg 2 tablets at bedtime.  3. Lithium 450 mg 2 daily.  4. Plavix 75 mg daily.  5. Aspirin 325 mg daily.  6. Crestor 10 mg at night.  7. Seroquel XR 400 mg 2 tablets daily.  8. Stavzor 500 mg 4 tablets daily.  9. Xanax ER 3 mg daily.  10.Citalopram 30 mg daily.  11.Focalin 30 mg daily.   DISCHARGE INSTRUCTIONS:  1. Low-sodium heart-healthy diet.  2. Wash cath site with soap and water.  Call if any bleeding, swelling      or drainage.  3. Increase activity slowly.  May shower.  No lifting for 2 days.  No      driving for 2 days.  4. Follow with Dr. Allyson Sabal May 25, 2009 at 11:30 a.m.  5. Have lab work done Monday Apr 26, 2009.  6. Lower extremity arterial Dopplers at [facility] on May 20, 2009 at 4 p.m.   HISTORY OF PRESENT ILLNESS:  A 50 year old male with history of coronary  artery disease and peripheral vascular disease.  He has had MIs in the  past and stents at [facility] in 1997.  He also has bilateral iliac disease  and status post stent in the past with re-stenting of his left lower  extremity 2 years ago by Dr. Allyson Sabal.  Also at that time Dr. Allyson Sabal re-  stented his mid RCA.  He has a known occluded circumflex.  EF was 50%.  He does have inferior hypokinesia.   Over the last 6 months, the patient had increasing claudication.  He  continues with tobacco, has hypertension and hyperlipidemia.  His recent  Dopplers revealed increased velocities in the left common iliac from  361, now 468 and ABI that  went down from 0.86-0.73.  He also has  moderate left renal artery stenosis as well with renal aortic ratio of  3.6 on the left.   ALLERGIES:  LIPITOR.   Dr. Allyson Sabal brought him in and did a combined left heart cath which  revealed 30% in-stent restenosis in mid RCA and a 30% proximal stenosis.  His circumflex as we knew was occluded otherwise stable and coronary  anatomy with normal EF.  He proceeded on to do PV angiogram and the  patient had stenosis and received PTA and stent to the left CIA and left  SFA.  The patient tolerated the procedure well.   The next morning, platelets were down to 117, questionably due to  heparin during the procedure.  We will follow this as an outpatient.  His blood pressures borderline 93/55 to 116/61, pulse as well as  borderline at 57, at discharge it had dropped to 49 during the night. H  he had occasional PVCs, respirations 20, temperature 98.1, oxygen  saturation was 93% room air.  Heart regular rate and rhythm.  Lungs were  clear and groin was stable without hematoma.   The patient ambulated without problems.   We were unable to add a beta blocker for his coronary artery disease nor  ACE inhibitor due to his hypotension as well as his  sinus bradycardia.  We will have his cardiologist, Dr. Mariah Milling address that on office visit  perhaps to adjust the Norvasc to make way for  ACE inhibitor as well as  we will check liver studies as he had been an alcohol abuser in the past  to ensure that he does not have a liver cirrhosis or other issue causing  a drop in platelets.      Darcella Gasman. Annie Paras, N.P.      Nanetta Batty, M.D.  Electronically Signed    LRI/MEDQ  D:  04/21/2009  T:  04/22/2009  Job:  [id number]   cc:   [name], MD

## 2011-04-18 NOTE — Cardiovascular Report (Signed)
NAMERYNELL, CIOTTI NO.:  [id number]   MEDICAL RECORD NO.:  [id number]          PATIENT TYPE:  OBV   LOCATION:  6533                         FACILITY:  [facility]   PHYSICIAN:  Nanetta Batty, M.D.   DATE OF BIRTH:  1961-05-15   DATE OF PROCEDURE:  09/24/2007  DATE OF DISCHARGE:                            CARDIAC CATHETERIZATION   [name] is a 50 year old gentleman with a history of tobacco  abuse, hypertension and hyperlipidemia.  He has had circumflex and RCA  stenting in the past as well as bilateral iliac stenting.  He has  recently complained of increasing shortness of breath and chest pain and  had a positive Myoview.  He has also complained of increasing left lower  extremity claudication and follow-up Doppler suggested in-stent  restenosis within the left iliac stent.  He underwent a cardiac  catheterization this morning and presents now for peripheral angiography  and potential intervention.   DESCRIPTION OF PROCEDURE:  A 6-French pigtail catheter was used for  midstream and distal abdominal aortography.  Visipaque dye was used  entirety of the case.  Retrograde aortic pressure was monitored during  the case.   ANGIOGRAPHIC RESULTS:  1. Abdominal aorta      a.     Renal arteries:      b.     Infrarenal abdominal aorta:  Normal.  2. Left lower extremity:  A 70% in-stent restenosis within the      proximal portion of the left iliac stent with a 40 mm trans-      stenotic gradient noted after pullback was performed and intra-      arterial nitroglycerin was administered.  3. Right lower extremity:  The right iliac stent was widely patent.   The existing 6-French sheath was exchanged for a long Cordis bright-tip  6-French sheath over the wire.  The patient received 2500 units of  heparin intravenously.  A 7 x 18 Genesis on Opta balloon stent pre-mount  was then used to stent the proximal left common iliac artery in-stent  restenotic region.  This  was deployed at 8-10 atmospheres, resulting in  reduction of 70% lesion to 0% residual.  The patient tolerated the  procedure well.  The guidewire was removed.  The sheath was sewn  securely in place.  The patient left the lab in stable condition.  The  sheath will be  removed once the ACT falls below 200.  The patient was gently hydrated,  discharged home in the morning, and will get follow-up Dopplers and  ABIs, after which he will be seen back at the office in follow-up.  He  left the lab in stable condition.      Nanetta Batty, M.D.  Electronically Signed     JB/MEDQ  D:  09/24/2007  T:  09/24/2007  Job:  [id number]   cc:   Second Floor PV Angiographic Suite  [facility]  [facility]

## 2011-04-18 NOTE — Discharge Summary (Signed)
[name], [name]             ACCOUNT NO.:  [id number]   MEDICAL RECORD NO.:  [id number]          PATIENT TYPE:  OBV   LOCATION:  6533                         FACILITY:  [facility]   PHYSICIAN:  [name], MD   DATE OF BIRTH:  January 18, 1961   DATE OF ADMISSION:  09/24/2007  DATE OF DISCHARGE:  09/25/2007                               DISCHARGE SUMMARY   DISCHARGE DIAGNOSES:  1. Coronary disease with chest pain, patent right coronary artery      stent placed from September of 2007 and known occluded circumflex      with normal left anterior descending at catheterization, plan is to      continue medical therapy.  2. Claudication with peripheral vascular disease history.  Patient has      had bilateral iliac stents in September of 2007 with in-stent      restenosis on the left in April of 2008 and again this admission      treated with angioplasty.  3. Left ventricular dysfunction with ejection fraction of 35 to 40%.  4. Chronic obstructive pulmonary disease and smoking.  5. Bipolar disorder, patient is on lithium and Seroquel.  6. Treated hypertension.  7. Treated dyslipidemia.   HOSPITAL COURSE:  Patient is a 50 year old male with coronary disease  and vascular disease.  He has had several interventions in the past.  His last catheterization was in April of 2004.  At that time, he had  left iliac in-stent restenosis with PTA.  He presented to the office in  [place] with chest pain, dyspnea and claudication.  He was setup for  outpatient catheterization and peripheral angiogram.  This was done  September 24, 2007 by Dr. Allyson Sabal.  Catheterization revealed normal left  main, normal LAD, a large normal OM with totaled circumflex which is  old, the RCA had a 30% proximal narrowing and a patent mid-RCA stent.  EF was 35 to 40%.  Peripheral angiogram revealed a patent right iliac  stent and 70% in-stent restenosis on the left.  This was treated with  PTA and stent.  He tolerated this  well.  We feel he can be discharged  September 25, 2007.  At discharge, the patient did complain of recent  problems with erectile dysfunction.  We changed his Toprol to Bystolic.  He is to come to the office to get samples.   DISCHARGE MEDICATIONS:  1. Dilantin 400 mg in the morning.  2. Omeprazole 40 mg a day.  3. Lithium 300 mg three times a day.  4. Plavix 75 mg a day.  5. Metoprolol ER 50 mg a day has been stopped, he will now take      Bystolic 5 mg a day.  6. Nitroglycerin sublingual p.r.n.  7. Benicar 20 mg a day.  8. Seroquel 200 mg h.s.  9. Crestor 10 mg a day.  10.Bystolic 10mg  po qd   LABS:  Sodium 138, potassium 4.0, BUN 7, creatinine 0.7, white count  5.6, hemoglobin 11.2, hematocrit 35.1, platelets 164.   DISPOSITION:  Patient is discharged in stable condition.  He will follow  up  with Dr. Dossie Arbour in a couple weeks in [place].      [name], P.A.      [name], MD  Electronically Signed    LKK/MEDQ  D:  09/25/2007  T:  09/25/2007  Job:  [id number]

## 2011-04-18 NOTE — Cardiovascular Report (Signed)
NAMEDEMARUS, [name] NO.:  [id number]   MEDICAL RECORD NO.:  [id number]          PATIENT TYPE:  INP   LOCATION:  2508                         FACILITY:  [facility]   PHYSICIAN:  Nanetta Batty, M.D.   DATE OF BIRTH:  [birth year]   DATE OF PROCEDURE:  04/20/2009  DATE OF DISCHARGE:                            CARDIAC CATHETERIZATION   [name] is a 50 year old thin-appearing Caucasian male, history of  CAD and PVOD.  He has had MIs in the past and stents at [facility] in 1997.  He has bilateral iliac artery stenting in the past as well as restenting  in his lower extremity 2 years ago at which time I restented his mid  RCA.  He has a known occluded circumflex with a 50% EF and inferior  hypokinesia.  He has had progressively worsening left lower extremity  claudication as well as continued tobacco abuse with smoking 3 packs a  day down to 1 pack a day.  He has hypertension and hyperlipidemia as  well.  Dopplers performed recently showed decrease in his left ABI from  0.86-0.73 with increase in his velocities.  He has also complained of  left neck and shoulder pain similar to his prior heart symptoms.  He  presents now for diagnostic coronary arteriography with staged lower  extremity intervention.   DESCRIPTION OF PROCEDURE:  The patient was brought to the second floor  [name] lab in postabsorptive state.  He was  premedicated with p.o. Valium, IV Versed and fentanyl.  His left groin  was prepped and shaved in the usual sterile fashion.  Xylocaine 1% was  used for local anesthesia.  A 6-French sheath was inserted into the left  femoral artery using standard Seldinger technique.  A 6-French left  Judkins diagnostic catheter was used for selective cholangiography.  Left ventriculography was not performed.  Visipaque dye was used for the  entirety of the case.  Retrograde aortic pressures were monitored during  the case.   ANGIOGRAPHIC RESULTS:  1. Left  main normal.  2. LAD normal.  3. Left circumflex occluded at the ostium.  4. Right coronary artery was dominant with at most 30% in-stent      restenosis within the mid RCA stent.   IMPRESSION:  [name] has patent right coronary artery stent with  anatomy essentially unchanged for what has been described before.  I do  not think that his shoulder pain is related to his heart, especially  given recent Myoview performed 4 months ago that showed inferolateral  scar with very mild peri-infarct ischemia.  Continued medical therapy  will be recommended.      Nanetta Batty, M.D.  Electronically Signed    JB/MEDQ  D:  04/20/2009  T:  04/20/2009  Job:  [id number]   cc:   Second Floor [facility]  [facility]  [name], MD  [name], MD

## 2011-04-18 NOTE — Cardiovascular Report (Signed)
[name], [name]             ACCOUNT NO.:  [id number]   MEDICAL RECORD NO.:  [id number]          PATIENT TYPE:  AMB   LOCATION:  SDS                          FACILITY:  [facility]   PHYSICIAN:  Nanetta Batty, M.D.   DATE OF BIRTH:  09-24-1961   DATE OF PROCEDURE:  04/20/2009  DATE OF DISCHARGE:                            CARDIAC CATHETERIZATION   [name] is a 50 year old thin-appearing Caucasian male, history of  CAD and PVOD.  He has had the MIs in the past and stenting at [facility] in  1997.  He also had bilateral iliac artery stenting with restenting 2  years ago for in-stent restenosis.  I have also restented his RCA in  the past.  He has ongoing tobacco abuse, hypertension, and  hyperlipidemia.  He has had progressive left lower extremity  claudication with decrease in his left ABI and increase in his  velocities across the previously placed stents.  He presents now for  angiography and potential intervention.   DESCRIPTION OF PROCEDURE:  The patient was brought to the second floor  [facility] in postabsorptive state.  He was  premedicated with p.o. Valium, IV Versed and fentanyl.  His left groin  was prepped and shaved in the usual sterile fashion.  Xylocaine 1% was  used for local anesthesia.  A long 6-French sheath was inserted into  left femoral artery using standard Seldinger technique.  The patient had  already undergone coronary angiography.  A 5-French Tennis Racquet  catheter was used for midstream and distal abdominal aortography.  Visipaque dye was used for the entirety of the case.  Retrograde aortic  pressure was monitored during the case.   ANGIOGRAPHIC RESULTS:  1. Abdominal aorta:      a.     Renal artery - 40% left renal artery stenosis.      b.     Infrarenal abdominal aorta - normal.  2. Left lower extremity:      a.     70% in-stent restenosis within the left common iliac       artery stent with a significant pullback gradient  after       administration of intra-arterial nitroglycerin.      b.     60% left common femoral artery stenosis just after the       previously stented segment.  3. Right lower extremity.      a.     Patent right common and external iliac artery stents with at       most 40-50% in-stent restenosis at the junction with no pullback       gradient.   DESCRIPTION OF PROCEDURE:  The patient received 2500 units of heparin  intravenously.  The in-stent restenosis within the left common iliac  artery stent was stented with a 7 x 23 Genesis and OPTA premount at 10  atmospheres resulting reduction of 70% stenosis to 0% residual.  The  left common femoral artery was then stented with an 8 x 3 Absolute stent  and postdilated with 6 x 3 balloon resulting reduction of 60% stenosis  to  0% residual.  The patient tolerated the procedure well.  ACT was  measured and sheath was removed.  Pressure was held on the groin to  achieve hemostasis.  The patient left the lab in stable condition.   IMPRESSION:  Successful left common iliac and common femoral artery  stenting for functional-limiting claudication with hemodynamically  significant lesions.  The patient will be treated with aspirin and  Plavix, hydrated  overnight, and discharged home in the morning.  He will get followup  Dopplers and ABIs.  We will see him back in the office after that for  further evaluation.  Cardiac risk factor modification was stressed  including smoking cessation.      Nanetta Batty, M.D.  Electronically Signed     JB/MEDQ  D:  04/20/2009  T:  04/20/2009  Job:  [id number]   cc:   Second Floor [facility]  [facility]  [name], MD  [name], MD

## 2011-04-18 NOTE — Cardiovascular Report (Signed)
NAMEDOMANIK, RAINVILLE NO.:  [id number]   MEDICAL RECORD NO.:  [id number]          PATIENT TYPE:  OBV   LOCATION:  6533                         FACILITY:  [facility]   PHYSICIAN:  Nanetta Batty, M.D.   DATE OF BIRTH:  [birth year]   DATE OF PROCEDURE:  09/24/2007  DATE OF DISCHARGE:                            CARDIAC CATHETERIZATION   [name] is a 50 year old gentleman with history of tobacco abuse,  hypertension, hyperlipidemia.  He has CAD status post circumflex  stenting in the past as well as RCA stenting.  He also has PVOD with  stenting of both iliacs.  He was seen in the office on October 14  complaining of shortness of breath, chest pain and claudication.  A  Myoview stress test showed a small area of inferior septal ischemia with  moderate-size fixed defect in the basal and mid inferior walls as well  as inferolateral region.  He presents now for cardiac catheterization to  define his anatomy and rule out ischemic etiology.   DESCRIPTION OF PROCEDURE:  The patient was brought to the second floor  [facility] cardiac catheterization lab in the postabsorptive state.  He  was premedicated with p.o. Valium, IV Versed and fentanyl.  His left  groin was prepped and draped in the usual sterile fashion; 1% Xylocaine  was used for local anesthesia.  A 6-French sheath was inserted into the  left femoral artery using standard Seldinger technique.  The 6-French  right and left Judkins diagnostic catheters as well as French pigtail  catheter were used for selective cholangiography, left ventriculography,  respectively.  Visipaque dye was used for the entirety of the case.  Retrograde aorta, left ventricular, and pullback pressures were  recorded.   HEMODYNAMIC RESULTS:  1. Aortic systolic pressure 141, diastolic pressure 81.  2. Left ventricular systolic pressure 149, end-diastolic pressure 18.   SELECTIVE CHOLANGIOGRAPHY:  1. Left main normal.  2. LAD normal.  3. Left circumflex: The left circumflex was occluded proximally after      the first marginal branch at the site of stenting.  This was      similar to findings back in April.  4. Right coronary artery:  The right coronary artery is a dominant      vessel was 30% smooth proximal stenosis.  The stented segment in      the mid to distal portion was widely patent with diffuse mild (20%)      in-stent restenosis.  5. Ventriculography:  RAO left ventriculogram was performed using 20      mL of Visipaque dye at 10 mL per second.  Overall LVEF estimated      between 35 and 40% with moderate to severe inferobasal hypokinesia.   IMPRESSION:  [name] has unchanged anatomy since his previous  angiogram 6 months ago. I am not sure of the etiology of his chest pain  and shortness of breath, although his ejection fraction has diminished  form low-normal in the 50% range to 35-40% for unclear reasons.  We will  plan on continuing medical therapy.      Nanetta Batty,  M.D.  Electronically Signed     JB/MEDQ  D:  09/24/2007  T:  09/24/2007  Job:  [id number]   cc:   [facility]  [facility]  Dr. Hazle Coca office in [place]

## 2011-04-21 NOTE — Discharge Summary (Signed)
[name], [name]             ACCOUNT NO.:  [id number]   MEDICAL RECORD NO.:  [id number]          PATIENT TYPE:  INP   LOCATION:  6525                         FACILITY:  [facility]   PHYSICIAN:  Madaline Savage, M.D.DATE OF BIRTH:  10/08/1961   DATE OF ADMISSION:  08/10/2006  DATE OF DISCHARGE:  08/14/2006                                 DISCHARGE SUMMARY   DISCHARGE DIAGNOSIS:  1. The unstable angina.  2. Claudication.  3. Coronary disease, previous circumflex stenting at [facility] September 2005      with cardiac catheterization this admission and staged intervention to      the right iliac followed by left iliac and the right coronary artery      with Cypher stents.  4. Moderate left ventricular dysfunction with ejection fraction of 40-45%  5. Depression, recovering alcoholic  6. Dyslipidemia with an LDL of 148 and with a history of statin      intolerance.  7. History of smoking.  8. History of seizure disorder   HOSPITAL COURSE:  The patient is a 50 year old male followed by Dr. Lelon Frohlich in  [place].  He has history of coronary disease and previous circumflex  stenting as noted.  He presented to Dr. Elsie Lincoln on August 09, 2006, with  complaints of chest pain and bilateral lower extremity pain consistent with  claudication.  He was admitted for outpatient catheterization.  This was  done August 10, 2006.  The patient did have an essentially totally  occluded right iliac which was dilated and stented by Dr. Jacinto Halim.  He had a  residual 70-80% left iliac, 60% left renal artery stenosis with 48 mm  gradient, normal LAD, normal ramus intermedius and occluded circumflex with  the previous stent, and a 70-80% proximal RCA stenosis and a 70% in-stent  RCA stenosis.  EF was 40-45%.  The patient tolerated the procedure well.  He  did have some recurrent chest pain, and it was decided to proceed with an  intervention to the RCA during this admission.  This was done on August 13, 2006, by Dr. Allyson Sabal.  Dr. Allyson Sabal also went ahead and did an angioplasty  and stent to the left common iliac artery.  The patient tolerated this  procedure well.  He will need lower extremity arterial Dopplers and a  followup with Dr. Elsie Lincoln in the [place] office.  We feel he can be  discharged August 14, 2006.   LABORATORY DATA:  White count 6.8, hemoglobin 10.7, hematocrit 33.1,  platelets 198.  Sodium 138, potassium 4.5, BUN 3, creatinine 0.9.   An EKG shows sinus rhythm with inferior Qs, incomplete right bundle branch  block.   DISCHARGE MEDICATIONS:  1. Dilantin 400 mg before bed.  2. Neurontin 300 mg 3 times a day.  3. Omeprazole 20 mg 1-2 tablets a day/  4. Lithium 300 mg 3 times a day.  5. Plavix 75 mg a day.  6. Metoprolol XL 50 mg a day.  7. BuSpar 15 mg 1/2 tablet twice a day.  8. Coated aspirin 325 mg a day.  9. Nitroglycerin sublingual p.r.n.  10.Will try him on Chantix as directed.   DISPOSITION:  The patient is discharged in stable condition.  He will follow  up with Dr. Elsie Lincoln in [place] after his Dopplers.      [name], P.A.    ______________________________  Madaline Savage, M.D.    [name]  D:  08/14/2006  T:  08/14/2006  Job:  [id number]   cc:   Kathlee Nations

## 2011-04-21 NOTE — Cardiovascular Report (Signed)
NAMEAMAIR, SHROUT NO.:  [id number]   MEDICAL RECORD NO.:  [id number]          PATIENT TYPE:  INP   LOCATION:  3737                         FACILITY:  [facility]   PHYSICIAN:  Nanetta Batty, M.D.   DATE OF BIRTH:  [birth year]   DATE OF PROCEDURE:  08/13/2006  DATE OF DISCHARGE:                              CARDIAC CATHETERIZATION   [name] is a 50 year old gentleman admitted with unstable angina on  August 10, 2006.  He was catheterized by Dr. Jacinto Halim and found to have a  total circumflex and high-grade segmental RCA disease with in-stent  restenosis.  He has a totally occluded right common iliac which was stented,  and high-grade symptomatic left common iliac artery stenosis.  He presents  now for intervention of his right coronary artery and his left iliac.   PROCEDURE DESCRIPTION:  The patient was brought to the second floor [facility] cardiac catheterization laboratory in the postabsorptive state.  He was  premedicated with p.o. Valium.  His left groin was prepped and shaved in the  usual sterile fashion.  Xylocaine 1% was used for local anesthesia.  A 6-  French sheath was inserted in the left femoral artery using standard  Seldinger technique.  A 6-French sheath was inserted into the left femoral  vein.  The patient was on aspirin and Plavix received Angiomax bolus with an  ACT documented at 312.  Visipaque dye was used for the entirety of the case.  Retrograde aortic pressures monitored during the case.  The patient did  receive 200 mcg of intracoronary nitroglycerin.   Using a 6-French JR-4 guide catheter with side holes, an 0.014/190 Asahi  Soft wire, and a 2.5/10 cutting balloon, atherectomy was performed on the  mid RCA stent in the lesion just proximal to this at nominal pressures using  overlapping inflations.  Following this, a 2.5/13 Cypher stent was then  deployed in overlapping fashion in the proximal part of the RCA and the  entire stented  segment was then postdilated with 2.75/20 Quantum Maverick at  16 atmospheres (2.81 mm).  The patient tolerated the procedure well without  hemodynamic loss or cardiographic sequelae.   IMPRESSION:  Successful right coronary artery percutaneous coronary  intervention and stenting using cutting balloon atherectomy and drug-eluting  stent along with Angiomax.   Following this, the 6-French sheath in the left femoral artery was exchanged  for a long 6-French sheath and the patient underwent direct PTA and stenting  of a 75-80% left common iliac artery stenosis which had a 40 mm  transstenotic gradient after administration of 200 mcg of intra-arterial  nitroglycerin with a 7 x 24 Genesis __________ premount at nominal pressures  resulting in a 0% residual stenosis.   IMPRESSION:  Successful percutaneous transluminal angioplasty and stenting  of left common iliac artery.   The guidewire and catheter were removed.  The sheath were sutured securely  in place.  The patient left the laboratory in stable condition.  The sheaths  will be removed in 2 hours.  The patient will be treated with aspirin and  Plavix, hydrated overnight, and  discharged home in the morning if he remains  clinically stable.  He left the laboratory in stable condition.  He will  need lower extremity Dopplers and ABIs after discharge prior to followup.      Nanetta Batty, M.D.  Electronically Signed     JB/MEDQ  D:  08/13/2006  T:  08/14/2006  Job:  [id number]   cc:   Second Floor [facility]

## 2011-04-21 NOTE — Cardiovascular Report (Signed)
NAMECAMILLO, QUADROS NO.:  [id number]   MEDICAL RECORD NO.:  [id number]          PATIENT TYPE:  OIB   LOCATION:  6531                         FACILITY:  [facility]   PHYSICIAN:  Nanetta Batty, M.D.   DATE OF BIRTH:  09-25-1961   DATE OF PROCEDURE:  03/05/2007  DATE OF DISCHARGE:                            CARDIAC CATHETERIZATION   INDICATIONS:  Mr. Dambrosia is a 50 year old gentleman who has had PTA  and stenting of occluded right common iliac artery as well as stenting  of left common iliac artery.  He has no coronary disease.  He has had  return of claudication and presents now for angiography and potential  intervention for lifestyle limiting claudication.   A 6-French sheath was inserted into the right femoral artery using  standard Seldinger technique.  A long 7-French, right tip Cordis sheath  was then inserted into the left femoral artery.  A 6-French pigtail  catheter was used for midstream and distal abdominal aortography with  bifemoral runoff using bolus chase digital subtraction step-table  technique.  Visipaque dye was used throughout the entirety of the case.  Retrograde aortic pressures monitored at end of case.   ANGIOGRAPHIC RESULTS:  1. Abdominal aorta.      a.     Renal arteries 40%, left renal artery stenosis.      b.     Infrarenal abdominal aorta normal.  2. Left lower extremity.      a.     Mild in-stent restenosis within the distal left common iliac       artery stent to be 80% lesion within the proximal left external       iliac artery and 56% within the distal left external iliac artery.      b.     Normal SFA with three-vessel runoff.  3. Right lower extremity.      a.     Widely patent right common and external iliac artery stent.      b.     Normal SFA with three-vessel runoff.   PROCEDURE:  The patient was already on Angiomax and received aspirin and  Plavix.  The in-stent restenosis and the 80% external iliac artery  lesion  were dilated with a Powerflex.  The entire distal common and  external iliac artery were then stented with an 8 x 6 Smart stent and  post dilated with a 7 x 6 Powerflex resulting in reduction of 80% and  60% lesions with 0% residual with excellent flow and no dissection.  The  patient tolerated the procedure well.  The long sheath was then  exchanged for a short seven and the sheath was sewn secure in place.  The patient  was in stable condition.  We will have his sheath removed in 2 hours.  He will remain recumbent for 6 hours.  He will be discharged home in the  morning.  Will get followup lower extremity Dopplers and ABIs after  which time I will see him back in the office.  He left the lab in stable  condition.      Nanetta Batty,  M.D.  Electronically Signed     JB/MEDQ  D:  03/05/2007  T:  03/05/2007  Job:  [id number]   cc:   [facility]  Rudi Coco, P.A.-C  Leary Roca, M.D.

## 2011-04-21 NOTE — Discharge Summary (Signed)
NAMESERAPHIM, TROW             ACCOUNT NO.:  [id number]   MEDICAL RECORD NO.:  [id number]          PATIENT TYPE:  OIB   LOCATION:  6531                         FACILITY:  [facility]   PHYSICIAN:  Nanetta Batty, M.D.   DATE OF BIRTH:  February 08, 1961   DATE OF ADMISSION:  03/05/2007  DATE OF DISCHARGE:  03/06/2007                               DISCHARGE SUMMARY   DISCHARGE DIAGNOSES:  1. Angina.  2. Claudication.  3. Coronary artery disease with percutaneous transluminal coronary      angioplasty and stent deployment to the right coronary artery      secondary to in-stent restenosis.  4. Residual disease of the circumflex.  5. Peripheral vascular disease with 80% proximal, undergoing      percutaneous transluminal angioplasty and stent to the left,      reducing 80% and 60% stenosis to 0%.  6. History of stent to the right that is patent.  7. Tobacco abuse.  Counseled on stopping.  8. Previous history of coronary disease with multiple catheterizations      over the years at [facility]  9. Depression, possible bipolar disorder, on lithium.  10.Hypertension.  11.Hyperlipidemia.   DISCHARGE CONDITION:  Improved.   PROCEDURES:  1. March 05, 2007, left heart catheterization by Dr. Nanetta Batty.  2. March 05, 2007, PTCA and stent deployment after use of IVUS into      the right coronary artery by Dr. Allyson Sabal.  3. March 05, 2007, PV angiogram with PTA and stent to the left      __________ for claudication.   DISCHARGE MEDICATIONS:  1. Aspirin 325 daily.  2. Omeprazole 20 mg two daily.  3. Dilantin 100 mg four at bedtime.  4. Neurontin 300 mg one three times a day.  5. Lithium 300 mg three times a day.  6. Plavix 75 mg daily.  7. Metoprolol ER 50 mg daily.  8. BuSpar 15 mg half a tab twice a day.  9. Exforge 5/160 one daily.  10.Crestor 10 mg daily.  11.Nitroglycerin sublingual as needed for chest pain.   DISCHARGE INSTRUCTIONS:  1. Increase activity slowly.  May shower and bathe.   No lifting for      two days.  No driving for two days.  2. Low sodium heart healthy diet.  3. Wash cath site with soap and water.  Call if any bleeding,      swelling, or drainage.  4. Stop smoking.  5. Follow with Dr. Allyson Sabal in [facility] March 22, 2007, at 10:15 a.m.  6. He is scheduled for lower extremity arterial Dopplers in [place]      at [place], [address].  They will call you with the date      and time.   HISTORY OF PRESENT ILLNESS:  This 50 year old gentleman was seen in the  office by Dr. Allyson Sabal secondary to angina, as well as claudication.  He  has a history of tobacco abuse, coronary disease, peripheral vascular  disease.  He, at times, is noncompliant with medications.  History of  cardiac catheterization in December of 2007 with a patent right coronary  artery stent and 100% stenosed circumflex, which is old.  He had been  having chest pain for minutes to hours at a time and claudication with  his left hip, left thigh, and cramping when he walks any distance at  all.  He also has a history of stent to his right common and external  iliac.  The patient has a previous history of alcohol abuse, but has not  had any alcohol in two years.   PAST MEDICAL HISTORY:  1. Tobacco use.  2. Alcohol use.  3. Coronary disease.  4. Chronic angina, as well as unstable angina.  5. Depression, possible bipolar disease.   FAMILY HISTORY, SOCIAL HISTORY, REVIEW OF SYMPTOMS:  See H&P.   ALLERGIES:  LIPITOR INTOLERANCE AND CONTRAST DYE ALLERGY.   PHYSICAL EXAM AT DISCHARGE:  Blood pressure 116/48, sinus rhythm.  Sinus  bradycardia with heart rate of 55.  Both groins are stable.   Dr. Allyson Sabal saw him and discharged him home.  He will follow up as an  outpatient.      Darcella Gasman. Annie Paras, N.P.      Nanetta Batty, M.D.  Electronically Signed    LRI/MEDQ  D:  03/06/2007  T:  03/06/2007  Job:  [id number]   cc:   Tim Lair, MD

## 2011-04-21 NOTE — Cardiovascular Report (Signed)
NAMEALOYSUIS, RIBAUDO NO.:  [id number]   MEDICAL RECORD NO.:  [id number]          PATIENT TYPE:  OIB   LOCATION:  6531                         FACILITY:  [facility]   PHYSICIAN:  Nanetta Batty, M.D.   DATE OF BIRTH:  1961/10/26   DATE OF PROCEDURE:  03/05/2007  DATE OF DISCHARGE:                            CARDIAC CATHETERIZATION   INDICATIONS:  Mr. Eichenberger is a 50 year old gentleman with multiple  cardiac risk factors who has undergone circumflex and RCA staged  intervention back in September along with PTA stenting of an occluded  right common iliac as well as stenting of a highly stenosed left common  iliac.  He does continue to smoke.  He was seen by Rudi Coco, P.A.-C  on March 31, with recurrent chest pain and claudication and was set up  for cardiac catheterization and peripheral angiography.   DESCRIPTION OF PROCEDURE:  The patient was brought to the second floor  [facility].  He was premedicated with p.o.  Valium.  His right groin and left groins were prepped and shaved in  usual sterile fashion.  Xylocaine 1% was used for local anesthesia.  A 6-  French sheath was inserted into the right femoral artery using standard  Seldinger technique.  A 6-French, right and left Judkins diagnostic  catheter as well as [place] pigtail catheter was used for selective  coronary angiography and left ventriculography respectively.  Visipaque  dye was used for entirety of the case.  Aortic, ventricular and pulmonic  pressures were recorded.   HEMODYNAMICS:  Aortic systolic pressure was 143, diastolic blood  pressure 74.  Left ventricular systolic pressure 132, end-diastolic  pressure 18.  There was no obvious pullback gradient noted.   SELECTIVE CHOLANGIOGRAPHY:  1. Left main normal.  2. LAD normal.  3. Left circumflex was occluded proximally just distal to a large OM      branch which was in the ramus distribution.  4. Right coronary  artery was dominant with 30-40% segmental proximal      followed by 40-50% in-stent restenosis within the mid RCA stent.   VENTRICULOGRAPHY:  RAO left ventriculogram was performed using 25 mL of  Visipaque dye at 12 mL per second.  There was mild inferobasal  hypokinesia.  EF was estimated visually at about 50%.   IMPRESSION:  Mr. Teel has moderate in-stent restenosis, but he does  have daily angina.  We will proceed with intravascular ultrasound-guided  percutaneous coronary intervention stenting using drug-eluting stent and  Angiomax.   The procedures were performed via the 7-French sheath in the left  femoral artery.  He received four baby aspirin this morning and was on  75 mg of Plavix.  He received an additional 300 mg of Plavix at the end  of the case.  He received Angiomax bolus with an ACT of 268.   Using a 7-French, JR-4 guide catheter with sideholes with Asahi soft  wire and a 3.2 French Atlantis IVUS catheter intravascular  ultrasound  was performed.  This segment measured 2.8 mm.  IVUS within the distal  stented segment revealed a lumen  of 1.9 x 2.2 (3.43 sq mm).  Because of  this, stenting was performed with a Cypher stent, the partial stent  sandwich, descending beyond the distal edge of the stent.  This was  deployed at 16 atmospheres.  The entire stented segment was post dilated  with a Quantum Maverick at 18 atmospheres (2.84 mm) resulting in 0%  residual without dissection with excellent flow.   IMPRESSION:  Successful intravascular ultrasound-guided percutaneous  coronary intervention stenting of native right coronary artery for in-  stent restenosis and recurrent angina with Cypher drug-eluting stent.  The patient will be treated with aspirin and Plavix.  Risk modification  including smoking cessation will be urged.      Nanetta Batty, M.D.  Electronically Signed     JB/MEDQ  D:  03/05/2007  T:  03/05/2007  Job:  [id number]   cc:   [facility]  [facility]  Rudi Coco, P.A.-C  Leary Roca, M.D.

## 2011-04-21 NOTE — Cardiovascular Report (Signed)
[name], [name]             ACCOUNT NO.:  [id number]   MEDICAL RECORD NO.:  [id number]          PATIENT TYPE:  OIB   LOCATION:  6526                         FACILITY:  [facility]   PHYSICIAN:  Cristy Hilts. Jacinto Halim, MD       DATE OF BIRTH:  1961/01/17   DATE OF PROCEDURE:  08/10/2006  DATE OF DISCHARGE:                              CARDIAC CATHETERIZATION   .   REFERRING PHYSICIAN:  [name], M.D.  [name], M.D.   OPERATION/PROCEDURE:  Left heart catheterization including:  1. Left ventriculography.  2. Selective right and left coronary arteriography.  3. Augmentation of 4 mcg Natrecor and nitroglycerin.Marland Kitchen  4. Abdominal aortogram.  5. Selective left renal arteriography.  6. Right iliac arteriography.  7. Percutaneous transluminal angioplasty of the right iliac artery.  8. Stenting of the right common iliac and external iliac artery.   INDICATIONS:  [name] is a 50 year old gentleman with history of  smoking, history of known coronary artery disease, who had been complaining  of chest pain suggestive of angina pectoris.  He had previously undergone  stent implantation x2 to the circumflex with Cypher stents at [facility] and also a nondrug-eluting stent to the right  coronary artery sometime in the past.  Because of his classic symptoms of  angina pectoris, he was admitted to the hospital for evaluation of his  coronary anatomy.  Abdominal aortogram was performed because of his severe  symptoms of claudication and abdominal physical exam.  He had severe right  hip and right leg claudication and also left hip and left leg claudication.  This claudication was worse on the right than on the left.  He is undergoing  abdominal aortogram and iliac arteriogram.  The left renal arteriography is  performed because of presence of stenosis on the left renal artery.  Because  of acute onset of pain in his right leg during cardiac catheterization, we  decided that we should proceed with __________ intervention to his right leg  to establish flow in this completely occluded common iliac and external  iliac artery.   HEMODYNAMIC DATA:  Left ventricular pressure was 102/2 with an end diastolic  pressure was 8 mmHg.  The aortic pressure was 110/56  with a mean of 78  mmHg.  There was no pressure gradient across the aorta.   ANGIOGRAPHIC DATA:  Left ventricle:  Left ventricular systolic function was  mildly depressed with ejection fraction of 45% with inferior, inferolateral  and lateral walls akinesis.  There was no significant mitral regurgitation.   Right coronary artery:  The right coronary artery is a moderate to large  caliber vessel.  This side has 70 to 80% stenosis proximal to the previously  placed stent.  The stent also in the mid RCA shows a in-stent 70% stenosis.  Otherwise the vessel is smooth.   Left main:  The left main is a large vessel.  It is smooth and normal.   Left circumflex:  Circumflex is occluded in its proximal segment.  A small  nipple is noted at its origin.  But  there is no flow, no collateralization  of the circumflex.   Ramus:  The ramus is a small vessel which bifurcates.  It is smooth and  normal.   Left anterior descending:  The LAD is a large caliber vessel.  Gives origin  to large diagonal 1 and several small diagonals.  It has mild luminal  irregularity.   PERIPHERAL ANGIOGRAPHY:  Abdominal aortogram reveals the presence two renal  arteries, one on either side.  The left renal artery appeared to have a 60-  70% stenosis.  The right renal artery was normal.   Selective left renal arteriography revealed a 60% stenosis of the left renal  artery.  There was a 40 mm pressure gradient across the stenotic distal  renal artery to the proximal renal artery.  This stenosis is complex which  involves two large bifurcation vessels of the left renal artery.   Distal abdominal aortogram revealed completely  occluded right iliac artery.  The left iliac artery showed a 70-80% calcific stenosis, both involving the  common iliac and external iliac artery.   Temporal pattern in the cardiac catheterization laboratory:  I had right  femoral arterial access and I was able to cross the completely occluded  right iliac artery and able to place catheters in and out without any  complications.  However, I felt that the secluded coronary arteries were  coming out.  We will go  go ahead and  fix the iliac arterial system.  I did  have a 7-French A1 guide with side holes into the right coronary artery and  giving  nitroglycerin and taking angiography.  However, the patient  complained of pain in his right leg although his legs were very warm and  without any acute tenderness.  Because of the presence of pain, I decided to  gently dilate the right common and external iliac arteries with a 6 mm  balloon.  We established increasing flow.  The duration of this intervention  will follow.   PERIPHERAL ANGIOGRAPHY AND ANGIOPLASTY:  After I felt that the leg pain, although may not be ischemia related because  of complete occlusion of the right common iliac artery, he had collaterals  to the distal flow.  I still felt that because I could be occlusive with the  catheter, I decided to gently dilate with a 6 x 10 mm Perflex long balloon.  I performed the balloon angioplasty at six atmospheres of pressure for 30  seconds having used Angiomax for anticoagulation.  After I performed this, I  noted that in the external iliac artery, there was a haziness with  appearance of a thrombus.  Hence, I decided that it was prudent to tackle  the iliac arterial system first before doing anything to the coronaries as  the coronaries appeared to be stable.   FINAL INTERVENTION DATA:  On the right external and common iliac arteries, I was able to successfully stent with a 9 x 60 mm Smart self-expanding stent  to the right common  iliac artery, extending to the next iliac artery .  A  second Smart stent, 8 x 80 mm Smart stent was overlapped through the  previously placed stent and deployed just above the common femoral artery.  This stent was post dilated with an 8 x 80 mm Perflex balloon for five  atmospheres of pressure for 45 seconds, four atmospheres of pressure for 40  seconds and then three atmospheres of pressure for 51 seconds.  Post balloon  angioplasty,  excellent, brisk flow was noted.   Femoral follow-through:  Right iliac arteriogram with femoral follow-through  revealed excellent, brisk flow throughout the right common femoral artery up  to the knee.  Below the knee, I was unable to visualize because of inability  to swig the the catheterization table below the knee.   Post procedure he had bounding pulses in the lower extremities without any  evidence of any ischemic changes or any ischemic compromise.   RECOMMENDATIONS:  1. The patient will need elective PCI of the right coronary artery.  He      also needs PTA of the left iliac artery.  He will also need a formal      evaluation of his lower extremities in the form of lower extremity      arterial run-off.  2. The patient will be admitted to the hospital and observed.  I suspect      the circumflex is completely occluded.  We could potentially try to      reopen this vessel.  However, I am not sure of long term benefits of      the same. However, the right coronary artery does need stent      implantation and I will also try to obtain the previous catheterization      reports.   TECHNIQUE OF PROCEDURE:  Diagnostic cardiac catheterization in the usual  sterile prep, was using a 6-French, right femoral artery access, I attempted  to pass a 0.07 [place] J-wire through the iliac artery.  I performed right  femoral angiography and visualized the iliac artery was completely occluded.  However, I was able to manipulate the wire into a prolapse position  and I  was able to pass through the occluded right common iliac and external iliac  artery and I was able to position the J-wire distally to the distal  abdominal aorta.  Then I used a 6-French multipurpose A2 catheter to advance  into the ascending aorta with 0.07 [place] JR.Marland Kitchen  The catheter was gently  advanced to the left ventricle and hemodynamisc  were monitored.  Hand  contrast injection of left ventricle was performed, both in LAO and RAO .  Right coronary artery was The right coronary artery was selectively engaged,  and angiography was performed.  Then the catheter was pulled back into the  abdominal aorta and the abdominal aortogram was performed.  Selective left  renal arteriography was performed.  The catheter was pulled back into the  distal abdominal aorta and distal abdominal aortogram was performed.   TECHNIQUE OF INTERVENTION:  Initially I attempted to do coronary intervention and a 6-French sheath in the right femoral artery was exchanged  for a 7-French sheath.  I used a 35 mm long sheath and placed the distal end  of the sheath into the distal abdominal aorta to avoid trauma to the common  and external iliac artery which is occluded.  At this time, I took a 48-  [place] JR-1 with side holes catheter to engage the right coronary artery.  Intra arterial nitroglycerin was adminsitered and angiography was performed.  In the interim, the patient developed lower extremity pain which I feel post  procedure that it was probably a musculoskeletal pain which was very focal  on the lateral aspect of his leg.   TECHNIQUE OF PERIPHERAL ANGIOGRAPHY:  The long sheath was pulled back into  the common femoral artery tracing, leaving a 0.03 fine J-wire into the  abdominal aorta to maintain  the access of the occluded common and external  iliac artery on the right.  Then using a 6 x 10 mm Powerflex balloon,  balloon angioplasty was performed for 30 seconds at six atmospheres of  pressure.   Post balloon angioplasty I noted there was presence of thrombus  in the external iliac artery.  Hence, I decided that we should proceed with  complete revascularization of his iliac arterial system with self-expanding  stent implantation.  Hence, I proceeded with implantation of a 9 mm x 60 mm  self-expanding stent into the right common iliac artery ostium and the  external iliac artery was stented with a 8 x 80 mm Smart stent overlapping  each other.  Post stent implantation excellent, brisk flow was established.  Then I used an 8 x 60 mm Perflex balloon, multiple inflations at common and  external iliac artery was performed for 45  seconds.  Post balloon angioplasty, angiography was repeated which showed  excellent brisk flow into the common iliac artery, external iliac artery,  all the way up to the superficial femoral artery distally.  The patient  tolerated the procedure.  Post procedure, brisk, bounding pulses were  evidence.  No immediate complications.      Cristy Hilts. Jacinto Halim, MD  Electronically Signed     JRG/MEDQ  D:  08/10/2006  T:  08/11/2006  Job:  [id number]   cc:   [name], M.D.  [name]

## 2011-05-08 ENCOUNTER — Other Ambulatory Visit: Payer: Self-pay | Admitting: Cardiovascular Disease

## 2011-05-08 ENCOUNTER — Other Ambulatory Visit: Payer: Self-pay | Admitting: Cardiology

## 2011-05-08 DIAGNOSIS — R0989 Other specified symptoms and signs involving the circulatory and respiratory systems: Secondary | ICD-10-CM

## 2011-05-08 DIAGNOSIS — I658 Occlusion and stenosis of other precerebral arteries: Secondary | ICD-10-CM

## 2011-05-09 ENCOUNTER — Encounter: Payer: Self-pay | Admitting: *Deleted

## 2011-05-09 ENCOUNTER — Ambulatory Visit (INDEPENDENT_AMBULATORY_CARE_PROVIDER_SITE_OTHER): Payer: Medicaid Other | Admitting: *Deleted

## 2011-05-09 ENCOUNTER — Encounter (INDEPENDENT_AMBULATORY_CARE_PROVIDER_SITE_OTHER): Payer: Medicaid Other | Admitting: *Deleted

## 2011-05-09 DIAGNOSIS — I6529 Occlusion and stenosis of unspecified carotid artery: Secondary | ICD-10-CM

## 2011-05-09 DIAGNOSIS — I658 Occlusion and stenosis of other precerebral arteries: Secondary | ICD-10-CM

## 2011-05-09 DIAGNOSIS — R0989 Other specified symptoms and signs involving the circulatory and respiratory systems: Secondary | ICD-10-CM

## 2011-05-09 DIAGNOSIS — R079 Chest pain, unspecified: Secondary | ICD-10-CM

## 2011-05-09 DIAGNOSIS — I739 Peripheral vascular disease, unspecified: Secondary | ICD-10-CM

## 2011-05-09 DIAGNOSIS — I2510 Atherosclerotic heart disease of native coronary artery without angina pectoris: Secondary | ICD-10-CM

## 2011-05-09 DIAGNOSIS — I700 Atherosclerosis of aorta: Secondary | ICD-10-CM

## 2011-05-09 DIAGNOSIS — I701 Atherosclerosis of renal artery: Secondary | ICD-10-CM

## 2011-05-09 MED ORDER — RANOLAZINE ER 500 MG PO TB12: ORAL_TABLET | ORAL | Status: DC

## 2011-05-09 NOTE — Patient Instructions (Addendum)
Start on Ranexa 500 mg take one tablet twice a day for one week then increase to 1000 mg one tablet twice a day for one week. Call the office with how the medication is working.

## 2011-05-12 ENCOUNTER — Encounter: Payer: Self-pay | Admitting: Cardiovascular Disease

## 2011-05-22 ENCOUNTER — Encounter: Payer: Self-pay | Admitting: Cardiovascular Disease

## 2011-06-20 ENCOUNTER — Encounter: Payer: Self-pay | Admitting: Cardiovascular Disease

## 2011-09-13 LAB — BASIC METABOLIC PANEL
BUN: 7
CO2: 29
Calcium: 8.7
Chloride: 102
Creatinine, Ser: 0.7
GFR calc Af Amer: >60
GFR calc non Af Amer: >60
Glucose, Bld: 92
Potassium: 4
Sodium: 138

## 2011-09-13 LAB — CBC
HCT: 35.1 — ABNORMAL LOW
Hemoglobin: 11.2 — ABNORMAL LOW
MCHC: 32
MCV: 88.1
Platelets: 164
RBC: 3.98 — ABNORMAL LOW
RDW: 17.3 — ABNORMAL HIGH
WBC: 5.6

## 2011-09-26 ENCOUNTER — Encounter: Payer: Self-pay | Admitting: *Deleted

## 2011-09-26 ENCOUNTER — Ambulatory Visit (INDEPENDENT_AMBULATORY_CARE_PROVIDER_SITE_OTHER): Payer: Medicaid Other | Admitting: *Deleted

## 2011-09-26 VITALS — BP 160/98 | Ht 67.0 in | Wt 167.0 lb

## 2011-09-26 DIAGNOSIS — I10 Essential (primary) hypertension: Secondary | ICD-10-CM

## 2011-09-26 NOTE — Progress Notes (Signed)
Pt was at psychiatrist's office today and they recommended pt go to cardiologist's office since BP was 146/100 and rechecked at 170/102 in their office today. In our office BP of 160/98. Pt last in office 12/2010 and has not had f/u since. Discussed with Dr. Mariah Milling, he knows pt well from [place], advised he incr amlodpine 5mg  to BID and monitor BP. Pt will f/u in 1-2 weeks.

## 2011-09-26 NOTE — Patient Instructions (Signed)
You may want to get a BP cuff for home if you don't already have one: Your physician has requested that you regularly monitor and record your blood pressure readings at home. Please use the same machine at the same time of day to check your readings and record them to bring to your follow-up visit.  Your physician has recommended you make the following change in your medication: Change Amlodipine 5mg  to TWICE daily. One tablet in AM, and one in PM.  Your physician recommends that you schedule a follow-up appointment in: 1-2 weeks with Dr. Mariah Milling

## 2011-09-30 ENCOUNTER — Encounter: Payer: Self-pay | Admitting: *Deleted

## 2011-10-05 ENCOUNTER — Encounter: Payer: Self-pay | Admitting: Cardiovascular Disease

## 2011-10-09 ENCOUNTER — Ambulatory Visit (INDEPENDENT_AMBULATORY_CARE_PROVIDER_SITE_OTHER): Payer: Medicaid Other | Admitting: Cardiovascular Disease

## 2011-10-09 ENCOUNTER — Encounter: Payer: Self-pay | Admitting: Cardiovascular Disease

## 2011-10-09 VITALS — BP 110/82 | HR 58 | Ht 67.0 in | Wt 164.0 lb

## 2011-10-09 DIAGNOSIS — F17200 Nicotine dependence, unspecified, uncomplicated: Secondary | ICD-10-CM

## 2011-10-09 DIAGNOSIS — I2510 Atherosclerotic heart disease of native coronary artery without angina pectoris: Secondary | ICD-10-CM

## 2011-10-09 DIAGNOSIS — I10 Essential (primary) hypertension: Secondary | ICD-10-CM

## 2011-10-09 DIAGNOSIS — I658 Occlusion and stenosis of other precerebral arteries: Secondary | ICD-10-CM

## 2011-10-09 DIAGNOSIS — E785 Hyperlipidemia, unspecified: Secondary | ICD-10-CM

## 2011-10-09 NOTE — Patient Instructions (Addendum)
You are doing well. No medication changes were made.  Please call us if you are taking amlodipine twice a day We have you as taking this once a day based on your visit today  Please call us if you have new issues that need to be addressed before your next appt.  The office will contact you for a follow up Appt. In 6 months

## 2011-10-09 NOTE — Assessment & Plan Note (Signed)
Blood pressure today is well controlled. He is uncertain as to whether and how much amlodipine he is taking. He believes he takes this once a day not b.i.d.. I've asked him to look at his medication at home and call our office if he is taking this b.i.d.. No further changes were made.

## 2011-10-09 NOTE — Assessment & Plan Note (Signed)
Currently with no symptoms of angina. No further workup at this time. Continue current medication regimen. 

## 2011-10-09 NOTE — Progress Notes (Signed)
Patient ID: [name], male    DOB: 03-Apr-1961, 50 y.o.   MRN: [id number]  HPI Comments: Mr. 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    Notes indicate that he has recently been seen by psychiatry and was noted to have elevated blood pressure. His amlodipine was increased to 5 mg b.i.d. Today he does not know if he is taking this medication at all. On further questioning, he believes he might be taking this once a day despite previous instructions that he take this b.i.d.Marland Kitchen  Blood pressure today is adequate. He has not been checking his blood pressure at home. He denies any claudication type symptoms in his legs. He denies any significant chest pain.     Notes from  June 2012 shows mild carotid arterial disease, patent stents in the lower extremities   Stress test January 2010 shows moderate to severe perfusion defect consistent with scar with mild peri-infarct ischemia in the inferior wall as well as inferolateral region. This was unchanged from previous stress test in October 2008   Cardiac catheterization May 2010 showing occluded left circumflex at the ostium, right coronary artery was dominant with 30% in-stent restenosis of the mid RCA stent. LAD was normal.  EKG shows normal sinus rhythm with a rate 58 beats per minute, T-wave abnormality in  V6, one and aVL (old changes)   Outpatient Encounter Prescriptions as of 10/09/2011  Medication Sig Dispense Refill  . ALPRAZolam (XANAX) 1 MG tablet Take 1 mg by mouth. Take one tablet by mouth four times a day as needed for anxiety.       Marland Kitchen amLODipine  (NORVASC) 5 MG tablet Take 5 mg by mouth daily.   30 tablet  6  . amphetamine-dextroamphetamine (ADDERALL) 20 MG tablet Take 20 mg by mouth daily.        Marland Kitchen aspirin 81 MG EC tablet Take 81 mg by mouth daily.        Marland Kitchen buPROPion (ZYBAN) 150 MG 12 hr tablet Take 150 mg by mouth daily.        . citalopram (CELEXA) 40 MG tablet Take 40 mg by mouth daily.        . clopidogrel (PLAVIX) 75 MG tablet Take 1 tablet (75 mg total) by mouth daily.  90 tablet  3  . divalproex (DEPAKOTE) 500 MG EC tablet 500 mg. Take four tablets by mouth daily.       Marland Kitchen lithium 300 MG tablet Take 300 mg by mouth 2 (two) times daily.        . pantoprazole (PROTONIX) 40 MG tablet Take 40 mg by mouth daily.        . QUEtiapine (SEROQUEL) 400 MG tablet Take 400 mg by mouth at bedtime.        . tadalafil (CIALIS) 10 MG tablet Take 10 mg by mouth daily as needed.        . zolpidem (AMBIEN) 10 MG tablet Take 10 mg by mouth at bedtime as needed.        Marland Kitchen DISCONTD: rosuvastatin (CRESTOR) 10 MG tablet Take 10 mg by mouth daily.  Review of Systems  Constitutional: Negative.   HENT: Negative.   Eyes: Negative.   Respiratory: Negative.   Cardiovascular: Negative.   Gastrointestinal: Negative.   Musculoskeletal: Negative.   Skin: Negative.   Neurological: Negative.   Hematological: Negative.   Psychiatric/Behavioral: Negative.   All other systems reviewed and are negative.    BP 110/82  Pulse 58  Ht 5\' 7"  (1.702 m)  Wt 164 lb (74.39 kg)  BMI 25.69 kg/m2 Blood pressure was confirmed by myself  Physical Exam  Nursing note and vitals reviewed. Constitutional: He is oriented to person, place, and time. He appears well-developed and well-nourished.  HENT:  Head: Normocephalic.  Nose: Nose normal.  Mouth/Throat: Oropharynx is clear and moist.  Eyes: Conjunctivae are normal. Pupils are equal, round, and reactive to light.  Neck: Normal range of motion. Neck supple. No JVD present.  Cardiovascular: Normal rate,  regular rhythm, S1 normal, S2 normal, normal heart sounds and intact distal pulses.  Exam reveals no gallop and no friction rub.   No murmur heard. Pulmonary/Chest: Effort normal and breath sounds normal. No respiratory distress. He has no wheezes. He has no rales. He exhibits no tenderness.  Abdominal: Soft. Bowel sounds are normal. He exhibits no distension. There is no tenderness.  Musculoskeletal: Normal range of motion. He exhibits no edema and no tenderness.  Lymphadenopathy:    He has no cervical adenopathy.  Neurological: He is alert and oriented to person, place, and time. Coordination normal.  Skin: Skin is warm and dry. No rash noted. No erythema.  Psychiatric: He has a normal mood and affect. His behavior is normal.           Assessment and Plan

## 2011-10-09 NOTE — Assessment & Plan Note (Signed)
No claudication type symptoms at this time. No further testing.

## 2011-10-09 NOTE — Assessment & Plan Note (Signed)
He was previously on Crestor 10 mg daily. He believes that this was discontinued but does not know why. He wonders if Dr. Darrick Huntsman stop this. We'll try to find out if this can be restarted.

## 2011-10-09 NOTE — Assessment & Plan Note (Signed)
He continues to smoke. We have counseled him on smoking cessation.

## 2011-10-17 ENCOUNTER — Telehealth: Payer: Self-pay

## 2011-10-17 MED ORDER — AMLODIPINE BESYLATE 5 MG PO TABS: 5.0000 mg | ORAL_TABLET | Freq: Every day | ORAL | Status: DC

## 2011-10-17 NOTE — Telephone Encounter (Signed)
Refill sent for amlodipine 5 mg take one tablet daily.  

## 2011-11-27 ENCOUNTER — Telehealth: Payer: Self-pay

## 2011-11-27 MED ORDER — PANTOPRAZOLE SODIUM 40 MG PO TBEC: 40.0000 mg | DELAYED_RELEASE_TABLET | Freq: Every day | ORAL | Status: DC

## 2011-11-27 NOTE — Telephone Encounter (Signed)
Refill sent for pantoprazole.  

## 2012-01-19 ENCOUNTER — Other Ambulatory Visit: Payer: Self-pay | Admitting: Cardiovascular Disease

## 2012-01-19 MED ORDER — PANTOPRAZOLE SODIUM 40 MG PO TBEC: 40.0000 mg | DELAYED_RELEASE_TABLET | Freq: Every day | ORAL | Status: DC

## 2012-01-19 NOTE — Telephone Encounter (Signed)
Refill sent for protonix

## 2012-03-20 ENCOUNTER — Other Ambulatory Visit: Payer: Self-pay | Admitting: Neurosurgery

## 2012-03-20 DIAGNOSIS — M5020 Other cervical disc displacement, unspecified cervical region: Secondary | ICD-10-CM

## 2012-03-23 ENCOUNTER — Inpatient Hospital Stay: Admission: RE | Admit: 2012-03-23 | Payer: Medicaid Other | Source: Ambulatory Visit

## 2012-04-04 ENCOUNTER — Other Ambulatory Visit: Payer: Self-pay | Admitting: Cardiovascular Disease

## 2012-04-05 ENCOUNTER — Telehealth: Payer: Self-pay

## 2012-04-05 NOTE — Telephone Encounter (Signed)
LMOM to have patient call regarding an appointment with Dr. Mariah Milling.

## 2012-04-09 ENCOUNTER — Encounter: Payer: Self-pay | Admitting: Cardiovascular Disease

## 2012-04-09 ENCOUNTER — Ambulatory Visit (INDEPENDENT_AMBULATORY_CARE_PROVIDER_SITE_OTHER): Payer: Medicaid Other | Admitting: Cardiovascular Disease

## 2012-04-09 VITALS — BP 138/88 | HR 79 | Ht 67.0 in | Wt 163.0 lb

## 2012-04-09 DIAGNOSIS — I10 Essential (primary) hypertension: Secondary | ICD-10-CM

## 2012-04-09 DIAGNOSIS — Z9889 Other specified postprocedural states: Secondary | ICD-10-CM

## 2012-04-09 DIAGNOSIS — F17200 Nicotine dependence, unspecified, uncomplicated: Secondary | ICD-10-CM

## 2012-04-09 DIAGNOSIS — R0989 Other specified symptoms and signs involving the circulatory and respiratory systems: Secondary | ICD-10-CM

## 2012-04-09 DIAGNOSIS — I658 Occlusion and stenosis of other precerebral arteries: Secondary | ICD-10-CM

## 2012-04-09 DIAGNOSIS — Z95828 Presence of other vascular implants and grafts: Secondary | ICD-10-CM

## 2012-04-09 DIAGNOSIS — I701 Atherosclerosis of renal artery: Secondary | ICD-10-CM

## 2012-04-09 DIAGNOSIS — R0602 Shortness of breath: Secondary | ICD-10-CM

## 2012-04-09 DIAGNOSIS — I2510 Atherosclerotic heart disease of native coronary artery without angina pectoris: Secondary | ICD-10-CM

## 2012-04-09 DIAGNOSIS — E785 Hyperlipidemia, unspecified: Secondary | ICD-10-CM

## 2012-04-09 MED ORDER — ROSUVASTATIN CALCIUM 5 MG PO TABS: 5.0000 mg | ORAL_TABLET | Freq: Every day | ORAL | Status: DC

## 2012-04-09 NOTE — Assessment & Plan Note (Signed)
Chest symptoms are vague. We have suggested he call us if symptoms get worse particularly with exertion. We will repeat a stress test, possibly a catheterization.

## 2012-04-09 NOTE — Assessment & Plan Note (Signed)
We will restart Crestor 5 mg daily. Recheck lab work in several months time.

## 2012-04-09 NOTE — Assessment & Plan Note (Signed)
We have encouraged him to continue to work on weaning his cigarettes and smoking cessation. He will continue to work on this and does not want any assistance with chantix.  

## 2012-04-09 NOTE — Progress Notes (Signed)
Patient ID: [name], male    DOB: Sep 16, 1961, 51 y.o.   MRN: [id number]  HPI Comments: [name] is a 51 year old gentleman with a history of coronary artery disease, stent to his RCA in April 2008 with occluded circumflex, bilateral iliac stenting with last stent to his left iliac in May 2010, mild to moderate renal artery stenosis on the left, history of hepatitis C, long alcohol history currently in remission, long history of smoking who continues to smoke, hypertension and hyperlipidemia who presents for routine follow up.   seen by psychiatry  He reports having pains in his legs when he walks. Occasional chest pain though he was able to walk around [employer] and do most of his ADLs. He does have a history of chronic pain in his legs and chest.   Previous ultrasound of his lower extremities 2012 showed moderate disease of the stent in the left iliac. Aorta showed mild disease. No significant renal artery stenosis. He was taken off Crestor 10 mg over one year ago. He has not been on a statin since that time. He has not had followup with Dr. Darrick Huntsman.  Stress test January 2010 shows moderate to severe perfusion defect consistent with scar with mild peri-infarct ischemia in the inferior wall as well as inferolateral region. This was unchanged from previous stress test in October 2008   Cardiac catheterization May 2010 showing occluded left circumflex at the ostium, right coronary artery was dominant with 30% in-stent restenosis of the mid RCA stent. LAD was normal.  EKG shows normal sinus rhythm with a rate 79 beats per minute, T-wave abnormality in  V6, one and aVL (old changes)   Outpatient Encounter Prescriptions as of 04/09/2012  Medication Sig Dispense Refill  . ALPRAZolam (XANAX) 1 MG tablet Take 1 mg by mouth. Take one tablet by mouth four times a day as needed for anxiety.       Marland Kitchen amLODipine (NORVASC) 5 MG tablet Take 1 tablet (5 mg total) by mouth daily.  30 tablet  6  .  amphetamine-dextroamphetamine (ADDERALL) 20 MG tablet Take 20 mg by mouth daily.        Marland Kitchen aspirin 81 MG EC tablet Take 81 mg by mouth daily.        Marland Kitchen buPROPion (ZYBAN) 150 MG 12 hr tablet Take 150 mg by mouth daily.        . citalopram (CELEXA) 40 MG tablet Take 40 mg by mouth daily.        . clopidogrel (PLAVIX) 75 MG tablet TAKE 1 TABLET (75 MG TOTAL) BY MOUTH DAILY.  90 tablet  2  . diazepam (VALIUM) 5 MG tablet Take 5 mg by mouth 2 (two) times daily.      . divalproex (DEPAKOTE) 500 MG EC tablet 500 mg. Take four tablets by mouth daily.       Marland Kitchen lithium 300 MG tablet Take 300 mg by mouth 2 (two) times daily.        . pantoprazole (PROTONIX) 40 MG tablet Take 1 tablet (40 mg total) by mouth daily.  90 tablet  3  . promethazine (PHENERGAN) 25 MG tablet Take 12.5 mg by mouth 2 (two) times daily.      . tadalafil (CIALIS) 10 MG tablet Take 10 mg by mouth daily as needed.        . zolpidem (AMBIEN) 10 MG tablet Take 10 mg by mouth at bedtime as needed.        . rosuvastatin (CRESTOR)  5 MG tablet Take 1 tablet (5 mg total) by mouth at bedtime.  30 tablet  11  . DISCONTD: QUEtiapine (SEROQUEL) 400 MG tablet Take 400 mg by mouth at bedtime.           Review of Systems  Constitutional: Negative.   HENT: Negative.   Eyes: Negative.   Respiratory: Positive for chest tightness.   Cardiovascular: Negative.   Gastrointestinal: Negative.   Musculoskeletal: Positive for myalgias.       Leg pain with walking  Skin: Negative.   Neurological: Negative.   Hematological: Negative.   Psychiatric/Behavioral: Negative.   All other systems reviewed and are negative.    BP 138/88  Pulse 79  Ht 5\' 7"  (1.702 m)  Wt 163 lb (73.936 kg)  BMI 25.53 kg/m2  Physical Exam  Nursing note and vitals reviewed. Constitutional: He is oriented to person, place, and time. He appears well-developed and well-nourished.  HENT:  Head: Normocephalic.  Nose: Nose normal.  Mouth/Throat: Oropharynx is clear and moist.   Eyes: Conjunctivae are normal. Pupils are equal, round, and reactive to light.  Neck: Normal range of motion. Neck supple. No JVD present.  Cardiovascular: Normal rate, regular rhythm, S1 normal, S2 normal and normal heart sounds.  Exam reveals no gallop and no friction rub.   No murmur heard. Pulses:      Carotid pulses are 2+ on the right side, and 2+ on the left side.      Radial pulses are 2+ on the right side, and 2+ on the left side.       Dorsalis pedis pulses are 0 on the right side, and 0 on the left side.       Posterior tibial pulses are 0 on the right side, and 0 on the left side.  Pulmonary/Chest: Effort normal and breath sounds normal. No respiratory distress. He has no wheezes. He has no rales. He exhibits no tenderness.  Abdominal: Soft. Bowel sounds are normal. He exhibits no distension. There is no tenderness.  Musculoskeletal: Normal range of motion. He exhibits no edema and no tenderness.  Lymphadenopathy:    He has no cervical adenopathy.  Neurological: He is alert and oriented to person, place, and time. Coordination normal.  Skin: Skin is warm and dry. No rash noted. No erythema.  Psychiatric: He has a normal mood and affect. His behavior is normal. Judgment and thought content normal.           Assessment and Plan

## 2012-04-09 NOTE — Patient Instructions (Addendum)
You are doing well. Please restart crestor 5 mg daily for cholesterol  We will schedule you for an ultrasound of the legs to look for a reason for your leg pain  Please call us if you have new issues that need to be addressed before your next appt.  Your physician wants you to follow-up in: 6 months.  You will receive a reminder letter in the mail two months in advance. If you don't receive a letter, please call our office to schedule the follow-up appointment.

## 2012-04-09 NOTE — Assessment & Plan Note (Signed)
Repeat ultrasound of the lower extremities ordered. Moderate left iliac stent disease seen last year.

## 2012-04-10 ENCOUNTER — Other Ambulatory Visit: Payer: Self-pay | Admitting: *Deleted

## 2012-04-10 MED ORDER — ATORVASTATIN CALCIUM 10 MG PO TABS: 10.0000 mg | ORAL_TABLET | Freq: Every day | ORAL | Status: DC

## 2012-04-10 NOTE — Telephone Encounter (Signed)
Prior Authorization needed for Crestor was denied pt would need to be on two or more statins before Crestor is approved. Dr. Mariah Milling requested pt start on atorvastatin 10mg  1 tablet daily at bedtime.

## 2012-05-02 ENCOUNTER — Encounter (INDEPENDENT_AMBULATORY_CARE_PROVIDER_SITE_OTHER): Payer: Medicaid Other

## 2012-05-02 DIAGNOSIS — Z95828 Presence of other vascular implants and grafts: Secondary | ICD-10-CM

## 2012-05-02 DIAGNOSIS — I739 Peripheral vascular disease, unspecified: Secondary | ICD-10-CM

## 2012-05-15 ENCOUNTER — Other Ambulatory Visit: Payer: Self-pay | Admitting: Cardiology

## 2012-05-15 DIAGNOSIS — I700 Atherosclerosis of aorta: Secondary | ICD-10-CM

## 2012-05-16 ENCOUNTER — Encounter (INDEPENDENT_AMBULATORY_CARE_PROVIDER_SITE_OTHER): Payer: Medicaid Other

## 2012-05-16 DIAGNOSIS — I739 Peripheral vascular disease, unspecified: Secondary | ICD-10-CM

## 2012-05-16 DIAGNOSIS — I771 Stricture of artery: Secondary | ICD-10-CM

## 2012-05-16 DIAGNOSIS — I700 Atherosclerosis of aorta: Secondary | ICD-10-CM

## 2012-05-23 ENCOUNTER — Ambulatory Visit (INDEPENDENT_AMBULATORY_CARE_PROVIDER_SITE_OTHER): Payer: Medicaid Other | Admitting: Cardiovascular Disease

## 2012-05-23 ENCOUNTER — Encounter: Payer: Self-pay | Admitting: Cardiovascular Disease

## 2012-05-23 ENCOUNTER — Other Ambulatory Visit: Payer: Medicaid Other

## 2012-05-23 VITALS — BP 162/98 | HR 76 | Ht 67.0 in | Wt 163.0 lb

## 2012-05-23 DIAGNOSIS — F17200 Nicotine dependence, unspecified, uncomplicated: Secondary | ICD-10-CM

## 2012-05-23 DIAGNOSIS — Z79899 Other long term (current) drug therapy: Secondary | ICD-10-CM

## 2012-05-23 DIAGNOSIS — I2510 Atherosclerotic heart disease of native coronary artery without angina pectoris: Secondary | ICD-10-CM

## 2012-05-23 DIAGNOSIS — I739 Peripheral vascular disease, unspecified: Secondary | ICD-10-CM

## 2012-05-23 NOTE — Assessment & Plan Note (Signed)
The patient has severe lifestyle limiting claudication on the left side. I could not feel his left femoral artery pulse. He had an aortoiliac duplex which showed significant stenosis in the left external iliac artery. No significant obstructive disease noted on the right side. I suspect that he has a total or subtotal occlusion.  Due to severity of his symptoms, I recommend proceeding with abdominal aortogram, lower extremity runoff and possible angioplasty. He is to continue aspirin and Plavix I strongly recommended to him smoking cessation but he does not seem to be able to do that at this time.

## 2012-05-23 NOTE — Progress Notes (Signed)
  HPI  [name] is a 51-year-old gentleman who was referred by Dr. Gollan for evaluation and management of peripheral arterial disease. He has a history of coronary artery disease, stent to his RCA in April 2008 with occluded circumflex, bilateral iliac stenting with last stent to his left iliac in May 2010, mild to moderate renal artery stenosis on the left, history of hepatitis C, long alcohol history currently in remission, long history of smoking who continues to smoke, hypertension and hyperlipidemia .  The patient reports significant left lower extremity claudication with minimal activities. It's mainly in the left thigh in the buttock area. It happens after walking a few steps and forces him to stop. Not much symptoms on the right side. He also reports frequent substernal chest discomfort at rest and with activities. Stress test January 2010 shows moderate to severe perfusion defect consistent with scar with mild peri-infarct ischemia in the inferior wall as well as inferolateral region. This was unchanged from previous stress test in October 2008  Cardiac catheterization May 2010 showing occluded left circumflex at the ostium, right coronary artery was dominant with 30% in-stent restenosis of the mid RCA stent. LAD was normal.     No Known Allergies   Current Outpatient Prescriptions on File Prior to Visit  Medication Sig Dispense Refill  . ALPRAZolam (XANAX) 1 MG tablet Take 1 mg by mouth. Take one tablet by mouth four times a day as needed for anxiety.       . amLODipine (NORVASC) 5 MG tablet Take 1 tablet (5 mg total) by mouth daily.  30 tablet  6  . aspirin 81 MG EC tablet Take 81 mg by mouth daily.        . atorvastatin (LIPITOR) 10 MG tablet Take 1 tablet (10 mg total) by mouth daily.  30 tablet  3  . buPROPion (ZYBAN) 150 MG 12 hr tablet Take 150 mg by mouth daily.        . clopidogrel (PLAVIX) 75 MG tablet TAKE 1 TABLET (75 MG TOTAL) BY MOUTH DAILY.  90 tablet  2  .  diazepam (VALIUM) 5 MG tablet Take 5 mg by mouth 2 (two) times daily.      . divalproex (DEPAKOTE) 500 MG EC tablet 500 mg. Takes 3 tablets daily.      . lithium 300 MG tablet Take 300 mg by mouth 2 (two) times daily.        . pantoprazole (PROTONIX) 40 MG tablet Take 1 tablet (40 mg total) by mouth daily.  90 tablet  3  . promethazine (PHENERGAN) 25 MG tablet Take 25 mg by mouth daily.       . zolpidem (AMBIEN) 10 MG tablet Take 10 mg by mouth at bedtime as needed.           Past Medical History  Diagnosis Date  . PAD (peripheral artery disease)   . Hypertension   . Hyperlipidemia   . Coronary artery disease      Past Surgical History  Procedure Date  . Subdural hematoma evacuation via craniotomy   . Neck surgery 01/2011    plate & screw placement   . Cardiac catheterization     Stent - 11     Family History  Problem Relation Age of Onset  . Emphysema Father   . Diabetes Other   . Alcohol abuse Other   . Hypertension Other   . Hyperlipidemia Other   . Seizures Other        History   Social History  . Marital Status: Divorced    Spouse Name: N/A    Number of Children: N/A  . Years of Education: N/A   Occupational History  . Disabled    Social History Main Topics  . Smoking status: Current Everyday Smoker -- 1.0 packs/day    Types: Cigarettes  . Smokeless tobacco: Not on file  . Alcohol Use: No  . Drug Use: No  . Sexually Active: Not on file   Other Topics Concern  . Not on file   Social History Narrative   17 years Bipolar/ADHD.Pt gets regular exercise.     ROS Constitutional: Negative for fever, chills, diaphoresis, activity change, appetite change and fatigue.  HENT: Negative for hearing loss, nosebleeds, congestion, sore throat, facial swelling, drooling, trouble swallowing, neck pain, voice change, sinus pressure and tinnitus.  Eyes: Negative for photophobia, pain, discharge and visual disturbance.  Respiratory: Negative for apnea, cough and  wheezing.  Cardiovascular: Negative for  palpitations and leg swelling.  Gastrointestinal: Negative for nausea, vomiting, abdominal pain, diarrhea, constipation, blood in stool and abdominal distention.  Genitourinary: Negative for dysuria, urgency, frequency, hematuria and decreased urine volume.  Skin: Negative for color change, pallor, rash and wound.  Neurological: Negative for dizziness, tremors, seizures, syncope, speech difficulty, weakness, light-headedness, numbness and headaches.      PHYSICAL EXAM   BP 162/98  Pulse 76  Ht 5' 7" (1.702 m)  Wt 163 lb (73.936 kg)  BMI 25.53 kg/m2 Constitutional: He is oriented to person, place, and time. He appears well-developed and well-nourished. No distress.  HENT: No nasal discharge.  Head: Normocephalic and atraumatic.  Eyes: Pupils are equal and round. Right eye exhibits no discharge. Left eye exhibits no discharge.  Neck: Normal range of motion. Neck supple. No JVD present. No thyromegaly present.  Cardiovascular: Normal rate, regular rhythm, normal heart sounds. Exam reveals no gallop and no friction rub. No murmur heard.  Pulmonary/Chest: Effort normal and breath sounds normal. No stridor. No respiratory distress. He has no wheezes. He has no rales. He exhibits no tenderness.  Abdominal: Soft. Bowel sounds are normal. He exhibits no distension. There is no tenderness. There is no rebound and no guarding.  Musculoskeletal: Normal range of motion. He exhibits no edema and no tenderness.  Neurological: He is alert and oriented to person, place, and time. Coordination normal.  Skin: Skin is warm and dry. No rash noted. He is not diaphoretic. No erythema. No pallor.  Psychiatric: He has a normal mood and affect. His behavior is normal. Judgment and thought content normal.   Vascular: Femoral pulse: Slightly diminished on the right side and absent on the left side with a faint bruit. . PT/DP: Faint on the right side and absent on the left  side.      ASSESSMENT AND PLAN   

## 2012-05-23 NOTE — Assessment & Plan Note (Signed)
I strongly advised him to quit smoking. 

## 2012-05-23 NOTE — Patient Instructions (Addendum)
Your physician has requested that you have a cardiac catheterization. Cardiac catheterization is used to diagnose and/or treat various heart conditions. Doctors may recommend this procedure for a number of different reasons. The most common reason is to evaluate chest pain. Chest pain can be a symptom of coronary artery disease (CAD), and cardiac catheterization can show whether plaque is narrowing or blocking your heart's arteries. This procedure is also used to evaluate the valves, as well as measure the blood flow and oxygen levels in different parts of your heart. For further information please visit [email or web address] Please follow instruction sheet, as given.  Your physician has requested that you have a peripheral vascular angiogram. This exam is performed at the hospital. During this exam IV contrast is used to look at arterial blood flow. Please review the information sheet given for details.

## 2012-05-23 NOTE — Assessment & Plan Note (Signed)
The patient has known history of coronary artery disease and continues to have exertional and nonexertional chest pain. I recommend cardiac catheterization at the same time of his PV angiography.

## 2012-05-24 LAB — PROTIME-INR: INR: 1 (ref 0.9–1.1)

## 2012-05-27 ENCOUNTER — Other Ambulatory Visit: Payer: Self-pay | Admitting: Cardiovascular Disease

## 2012-05-27 DIAGNOSIS — R079 Chest pain, unspecified: Secondary | ICD-10-CM

## 2012-05-27 DIAGNOSIS — I739 Peripheral vascular disease, unspecified: Secondary | ICD-10-CM

## 2012-05-29 ENCOUNTER — Encounter (HOSPITAL_BASED_OUTPATIENT_CLINIC_OR_DEPARTMENT_OTHER): Payer: Self-pay

## 2012-05-29 ENCOUNTER — Ambulatory Visit (HOSPITAL_COMMUNITY)
Admission: RE | Admit: 2012-05-29 | Discharge: 2012-05-29 | Disposition: A | Discharge status: Home / Self Care | Payer: Medicaid Other | Source: Ambulatory Visit | Attending: Cardiovascular Disease | Admitting: Cardiovascular Disease

## 2012-05-29 ENCOUNTER — Inpatient Hospital Stay (HOSPITAL_BASED_OUTPATIENT_CLINIC_OR_DEPARTMENT_OTHER): Admit: 2012-05-29 | Payer: Medicaid Other | Admitting: Cardiovascular Disease

## 2012-05-29 ENCOUNTER — Encounter (HOSPITAL_COMMUNITY)
Admission: RE | Disposition: A | Discharge status: Home / Self Care | Payer: Self-pay | Source: Ambulatory Visit | Attending: Cardiovascular Disease

## 2012-05-29 DIAGNOSIS — I70219 Atherosclerosis of native arteries of extremities with intermittent claudication, unspecified extremity: Secondary | ICD-10-CM | POA: Insufficient documentation

## 2012-05-29 DIAGNOSIS — I10 Essential (primary) hypertension: Secondary | ICD-10-CM | POA: Insufficient documentation

## 2012-05-29 DIAGNOSIS — I2510 Atherosclerotic heart disease of native coronary artery without angina pectoris: Secondary | ICD-10-CM

## 2012-05-29 DIAGNOSIS — F17200 Nicotine dependence, unspecified, uncomplicated: Secondary | ICD-10-CM | POA: Insufficient documentation

## 2012-05-29 DIAGNOSIS — I739 Peripheral vascular disease, unspecified: Secondary | ICD-10-CM

## 2012-05-29 DIAGNOSIS — I209 Angina pectoris, unspecified: Secondary | ICD-10-CM | POA: Insufficient documentation

## 2012-05-29 DIAGNOSIS — R079 Chest pain, unspecified: Secondary | ICD-10-CM

## 2012-05-29 DIAGNOSIS — E785 Hyperlipidemia, unspecified: Secondary | ICD-10-CM | POA: Insufficient documentation

## 2012-05-29 HISTORY — PX: PERCUTANEOUS STENT INTERVENTION: SHX5500

## 2012-05-29 HISTORY — PX: LEFT HEART CATHETERIZATION WITH CORONARY ANGIOGRAM: SHX5451

## 2012-05-29 HISTORY — PX: LOWER EXTREMITY ANGIOGRAM: SHX5508

## 2012-05-29 LAB — POCT ACTIVATED CLOTTING TIME
Activated Clotting Time: 334 seconds
Activated Clotting Time: 194 seconds

## 2012-05-29 LAB — CBC
HCT: 46.9 % (ref 39.0–52.0)
Hemoglobin: 16.2 g/dL (ref 13.0–17.0)
MCH: 33.3 pg (ref 26.0–34.0)
MCHC: 34.5 g/dL (ref 30.0–36.0)
MCV: 96.3 fL (ref 78.0–100.0)
Platelets: 128 10*3/uL — ABNORMAL LOW (ref 150–400)
RBC: 4.87 MIL/uL (ref 4.22–5.81)
RDW: 13 % (ref 11.5–15.5)
WBC: 6.6 10*3/uL (ref 4.0–10.5)

## 2012-05-29 LAB — PROTIME-INR
INR: 1.02 (ref 0.00–1.49)
Prothrombin Time: 13.6 seconds (ref 11.6–15.2)

## 2012-05-29 SURGERY — LEFT HEART CATHETERIZATION WITH CORONARY ANGIOGRAM
Anesthesia: LOCAL

## 2012-05-29 SURGERY — JV LEFT HEART CATHETERIZATION WITH CORONARY ANGIOGRAM
Anesthesia: Moderate Sedation

## 2012-05-29 MED ORDER — ALPRAZOLAM 0.25 MG PO TABS
ORAL_TABLET | ORAL | Status: AC
  Filled 2012-05-29: qty 4

## 2012-05-29 MED ORDER — HEPARIN SODIUM (PORCINE) 1000 UNIT/ML IJ SOLN
INTRAMUSCULAR | Status: AC
  Filled 2012-05-29: qty 1

## 2012-05-29 MED ORDER — SODIUM CHLORIDE 0.9 % IV SOLN: INTRAVENOUS | Status: DC

## 2012-05-29 MED ORDER — ASPIRIN 81 MG PO CHEW
324.0000 mg | CHEWABLE_TABLET | ORAL | Status: AC
  Administered 2012-05-29: 324 mg via ORAL
  Filled 2012-05-29: qty 4

## 2012-05-29 MED ORDER — MORPHINE SULFATE 4 MG/ML IJ SOLN: 2.0000 mg | INTRAMUSCULAR | Status: DC | PRN

## 2012-05-29 MED ORDER — SODIUM CHLORIDE 0.9 % IV SOLN: 250.0000 mL | INTRAVENOUS | Status: DC | PRN

## 2012-05-29 MED ORDER — SODIUM CHLORIDE 0.9 % IV SOLN: Freq: Once | INTRAVENOUS | Status: DC

## 2012-05-29 MED ORDER — SODIUM CHLORIDE 0.9 % IJ SOLN: 3.0000 mL | INTRAMUSCULAR | Status: DC | PRN

## 2012-05-29 MED ORDER — ACETAMINOPHEN 325 MG PO TABS: 650.0000 mg | ORAL_TABLET | ORAL | Status: DC | PRN

## 2012-05-29 MED ORDER — NITROGLYCERIN 0.2 MG/ML ON CALL CATH LAB
INTRAVENOUS | Status: AC
  Filled 2012-05-29: qty 1

## 2012-05-29 MED ORDER — LIDOCAINE HCL (PF) 1 % IJ SOLN
INTRAMUSCULAR | Status: AC
  Filled 2012-05-29: qty 30

## 2012-05-29 MED ORDER — FENTANYL CITRATE 0.05 MG/ML IJ SOLN
INTRAMUSCULAR | Status: AC
  Filled 2012-05-29: qty 2

## 2012-05-29 MED ORDER — HEPARIN (PORCINE) IN NACL 2-0.9 UNIT/ML-% IJ SOLN
INTRAMUSCULAR | Status: AC
  Filled 2012-05-29: qty 1000

## 2012-05-29 MED ORDER — ALPRAZOLAM 0.25 MG PO TABS
1.0000 mg | ORAL_TABLET | Freq: Once | ORAL | Status: AC
  Administered 2012-05-29: 1 mg via ORAL

## 2012-05-29 MED ORDER — SODIUM CHLORIDE 0.9 % IJ SOLN: 3.0000 mL | Freq: Two times a day (BID) | INTRAMUSCULAR | Status: DC

## 2012-05-29 MED ORDER — ONDANSETRON HCL 4 MG/2ML IJ SOLN: 4.0000 mg | Freq: Four times a day (QID) | INTRAMUSCULAR | Status: DC | PRN

## 2012-05-29 MED ORDER — MIDAZOLAM HCL 2 MG/2ML IJ SOLN
INTRAMUSCULAR | Status: AC
  Filled 2012-05-29: qty 2

## 2012-05-29 NOTE — CV Procedure (Signed)
Cardiac Catheterization Procedure Note  Name: [name] MRN: [id number] DOB: [birth year]  Procedure: Left Heart Cath, Selective Coronary Angiography, LV angiography Abdominal aortogram, lower extremity runoff, selective left iliac angiography, angioplasty of the distal left common iliac artery and self-expanding stent placement.  Indication: Angina with known history of coronary artery disease. Lifestyle limiting claudication with known history of PAD.   Medications:  Sedation:  2 mg IV Versed, 75 mcg IV Fentanyl  Contrast:  170 mL Omnipaque  Cardiac cath Procedural details: The right groin was prepped, draped, and anesthetized with 1% lidocaine. There was no peripheral venous access. Thus, the right common femoral vein was accessed and a 5 French sheath was placed. Using modified Seldinger technique, a 5 French sheath was introduced into the right femoral artery. Standard Judkins catheters were used for coronary angiography and left ventriculography. Catheter exchanges were performed over a guidewire. There were no immediate procedural complications.    Procedural Findings:  Hemodynamics: AO:  161/91   mmHg LV:  166/15    mmHg LVEDP: 22  mmHg  Coronary angiography: Coronary dominance: Right   Left Main:  Normal in size and free of significant disease.  Left Anterior Descending (LAD):  Normal in size and mildly tortuous throughout its course. It has minor irregularities without evidence of obstructive disease.  1st diagonal (D1):  Normal in size and free of significant disease.  2nd diagonal (D2):  Medium size and free of significant disease.  3rd diagonal (D3):  Medium size and free of significant disease.  Circumflex (LCx):  The vessel is occluded proximally right after giving 2 medium size OM branches. The occlusion appears to be just before the previously placed stent.  1st obtuse marginal:  Normal in size and free of significant disease.  2nd obtuse marginal:   Normal in size with no significant disease.    Right Coronary Artery: Normal in size. There is a 20% tubular stenosis proximally. The stent is noted in the midsegment with 30-40% in-stent restenosis. The rest of the vessel is free of significant disease.  posterior descending artery: Normal in size with minor irregularities.  posterior lateral branch:  Small size branch with no significant disease.  Left ventriculography: Left ventricular systolic function is moderately reduced , LVEF is estimated at 35 %, there is no significant mitral regurgitation   Final Conclusions:  1. Significant 2 vessel coronary artery disease with known occluded left circumflex. Patent stent in the RCA with mild instent restenosis. No significant change in anatomy since most recent cardiac catheterization. 2. Moderately reduced LV systolic function with an estimated ejection fraction of 35%.  Recommendations:  Continue medical therapy for coronary artery disease.    PV Procedural details: The pigtail catheter was withdrawn to the abdominal aorta just above the renal arteries. A power injection of 72ml/sec contrast over 1 sec was performed for Abdominal Aortic Angiography.  The catheter was then pulled back to a level just above the Aortic bifurcation, and a second power injection was performed to evaluate bi-ileiofemoral arteries with runnoff.  Interventional PV Procedure:  I then decided to proceed with treatment of the distal left common iliac artery. The pigtail catheter was removed. A crossover catheter was advanced which engage the left common iliac artery. The woolly wire was then advanced. The 5 French sheath was removed and a 6 [place] crossover destination sheath was advanced carefully and placed in the proximal right left common iliac. Angiography was then performed of the left common iliac artery. Unfractionated  heparin was given with therapeutic ACT. The site of stenosis was then predilated with an 8 x 30  mm Fox cross balloon to 8 atmospheres. The initial plan was to try to achieve reasonable results with balloon angioplasty given that all the stenosis was in-stent. However, after the inflation, angiography showed plaque protrusion into the artery with risk of distal embolization. Thus, I decided to proceed with self-expanding stent placement to cover that area. And 9 x 40 Smart self-expanding stent was used to cover the whole area. This was post dilated with an 8 x 30 mm balloon with excellent results. The sheath was then pulled back over the dilator and the wire. The sheath was then removed and the site was closed with a Perclose device. There was no immediate complications.     Findings:  Abdominal aorta: Mild atherosclerosis without significant disease. No evidence of aneurysm.  Left renal artery: Patent with mild ostial stenosis.  Right renal artery: Normal  Celiac artery: Patent without significant disease.  Superior mesenteric artery: No significant disease.  Right common iliac artery: Patent stent with minimal restenosis  Right internal iliac artery: Occluded  Right external iliac artery: Patent stent with minimal restenosis.  Right common femoral artery: Normal  Right profunda femoral artery: Normal  Right superficial femoral artery: Normal  Right popliteal artery: Normal  Right tibial peroneal trunk: Minor irregularities.  Right anterior tibial artery: Patent  Right peroneal artery: Patent  Right posterior tibial artery: Patent  Left common iliac artery:  Patent stent proximally. However, distally at the junction of the left external iliac there is an 85% in-stent restenosis.  Left internal iliac artery: Occluded.  Left external iliac artery: Patent stent with minimal restenosis.  Left common femoral artery: Normal  Left profunda femoral artery: Normal  Left superficial femoral artery:  Minor irregularities.  Left popliteal artery: Normal  Left tibial  peroneal trunk: Minor irregularities  Left anterior tibial artery: Minor irregularities  Left peroneal artery: Minor irregularities  Left posterior tibial artery: Minor irregularities.  Conclusions: 1. Severe in-stent restenosis in the distal left common iliac artery. Patent stents on the right side with no significant infrainguinal disease. 2. Successful angioplasty and self-expanding stent placement to the distal left common iliac artery extending into the left external iliac artery.  Recommendations:  Continue aggressive medical therapy. smoking cessation is strongly advised.   Lorine Bears, MD, [facility] 05/29/2012 1:41 PM

## 2012-05-29 NOTE — Interval H&P Note (Signed)
History and Physical Interval Note:  05/29/2012 12:01 PM  [name]  has presented today for surgery, with the diagnosis of chest pain  The various methods of treatment have been discussed with the patient and family. After consideration of risks, benefits and other options for treatment, the patient has consented to  Procedure(s) (LRB): LEFT HEART CATHETERIZATION WITH CORONARY ANGIOGRAM (N/A) LOWER EXTREMITY ANGIOGRAM (N/A) as a surgical intervention .  The patient's history has been reviewed, patient examined, no change in status, stable for surgery.  I have reviewed the patients' chart and labs.  Questions were answered to the patient's satisfaction.     Lorine Bears

## 2012-05-29 NOTE — H&P (View-Only) (Signed)
HPI  [name] is a 51 year old gentleman who was referred by Dr. Mariah Milling for evaluation and management of peripheral arterial disease. He has a history of coronary artery disease, stent to his RCA in April 2008 with occluded circumflex, bilateral iliac stenting with last stent to his left iliac in May 2010, mild to moderate renal artery stenosis on the left, history of hepatitis C, long alcohol history currently in remission, long history of smoking who continues to smoke, hypertension and hyperlipidemia .  The patient reports significant left lower extremity claudication with minimal activities. It's mainly in the left thigh in the buttock area. It happens after walking a few steps and forces him to stop. Not much symptoms on the right side. He also reports frequent substernal chest discomfort at rest and with activities. Stress test January 2010 shows moderate to severe perfusion defect consistent with scar with mild peri-infarct ischemia in the inferior wall as well as inferolateral region. This was unchanged from previous stress test in October 2008  Cardiac catheterization May 2010 showing occluded left circumflex at the ostium, right coronary artery was dominant with 30% in-stent restenosis of the mid RCA stent. LAD was normal.     No Known Allergies   Current Outpatient Prescriptions on File Prior to Visit  Medication Sig Dispense Refill  . ALPRAZolam (XANAX) 1 MG tablet Take 1 mg by mouth. Take one tablet by mouth four times a day as needed for anxiety.       Marland Kitchen amLODipine (NORVASC) 5 MG tablet Take 1 tablet (5 mg total) by mouth daily.  30 tablet  6  . aspirin 81 MG EC tablet Take 81 mg by mouth daily.        Marland Kitchen atorvastatin (LIPITOR) 10 MG tablet Take 1 tablet (10 mg total) by mouth daily.  30 tablet  3  . buPROPion (ZYBAN) 150 MG 12 hr tablet Take 150 mg by mouth daily.        . clopidogrel (PLAVIX) 75 MG tablet TAKE 1 TABLET (75 MG TOTAL) BY MOUTH DAILY.  90 tablet  2  .  diazepam (VALIUM) 5 MG tablet Take 5 mg by mouth 2 (two) times daily.      . divalproex (DEPAKOTE) 500 MG EC tablet 500 mg. Takes 3 tablets daily.      Marland Kitchen lithium 300 MG tablet Take 300 mg by mouth 2 (two) times daily.        . pantoprazole (PROTONIX) 40 MG tablet Take 1 tablet (40 mg total) by mouth daily.  90 tablet  3  . promethazine (PHENERGAN) 25 MG tablet Take 25 mg by mouth daily.       Marland Kitchen zolpidem (AMBIEN) 10 MG tablet Take 10 mg by mouth at bedtime as needed.           Past Medical History  Diagnosis Date  . PAD (peripheral artery disease)   . Hypertension   . Hyperlipidemia   . Coronary artery disease      Past Surgical History  Procedure Date  . Subdural hematoma evacuation via craniotomy   . Neck surgery 01/2011    plate & screw placement   . Cardiac catheterization     Stent - 11     Family History  Problem Relation Age of Onset  . Emphysema Father   . Diabetes Other   . Alcohol abuse Other   . Hypertension Other   . Hyperlipidemia Other   . Seizures Other  History   Social History  . Marital Status: Divorced    Spouse Name: N/A    Number of Children: N/A  . Years of Education: N/A   Occupational History  . Disabled    Social History Main Topics  . Smoking status: Current Everyday Smoker -- 1.0 packs/day    Types: Cigarettes  . Smokeless tobacco: Not on file  . Alcohol Use: No  . Drug Use: No  . Sexually Active: Not on file   Other Topics Concern  . Not on file   Social History Narrative   17 years Bipolar/ADHD.Pt gets regular exercise.     ROS Constitutional: Negative for fever, chills, diaphoresis, activity change, appetite change and fatigue.  HENT: Negative for hearing loss, nosebleeds, congestion, sore throat, facial swelling, drooling, trouble swallowing, neck pain, voice change, sinus pressure and tinnitus.  Eyes: Negative for photophobia, pain, discharge and visual disturbance.  Respiratory: Negative for apnea, cough and  wheezing.  Cardiovascular: Negative for  palpitations and leg swelling.  Gastrointestinal: Negative for nausea, vomiting, abdominal pain, diarrhea, constipation, blood in stool and abdominal distention.  Genitourinary: Negative for dysuria, urgency, frequency, hematuria and decreased urine volume.  Skin: Negative for color change, pallor, rash and wound.  Neurological: Negative for dizziness, tremors, seizures, syncope, speech difficulty, weakness, light-headedness, numbness and headaches.      PHYSICAL EXAM   BP 162/98  Pulse 76  Ht 5\' 7"  (1.702 m)  Wt 163 lb (73.936 kg)  BMI 25.53 kg/m2 Constitutional: He is oriented to person, place, and time. He appears well-developed and well-nourished. No distress.  HENT: No nasal discharge.  Head: Normocephalic and atraumatic.  Eyes: Pupils are equal and round. Right eye exhibits no discharge. Left eye exhibits no discharge.  Neck: Normal range of motion. Neck supple. No JVD present. No thyromegaly present.  Cardiovascular: Normal rate, regular rhythm, normal heart sounds. Exam reveals no gallop and no friction rub. No murmur heard.  Pulmonary/Chest: Effort normal and breath sounds normal. No stridor. No respiratory distress. He has no wheezes. He has no rales. He exhibits no tenderness.  Abdominal: Soft. Bowel sounds are normal. He exhibits no distension. There is no tenderness. There is no rebound and no guarding.  Musculoskeletal: Normal range of motion. He exhibits no edema and no tenderness.  Neurological: He is alert and oriented to person, place, and time. Coordination normal.  Skin: Skin is warm and dry. No rash noted. He is not diaphoretic. No erythema. No pallor.  Psychiatric: He has a normal mood and affect. His behavior is normal. Judgment and thought content normal.   Vascular: Femoral pulse: Slightly diminished on the right side and absent on the left side with a faint bruit. Marland Kitchen PT/DP: Faint on the right side and absent on the left  side.      ASSESSMENT AND PLAN

## 2012-05-29 NOTE — Discharge Instructions (Signed)
Peripheral Vascular Disease Peripheral Vascular Disease (PVD), also called Peripheral Arterial Disease (PAD), is a circulation problem caused by cholesterol (atherosclerotic plaque) deposits in the arteries. PVD commonly occurs in the lower extremities (legs) but it can occur in other areas of the body, such as your arms. The cholesterol buildup in the arteries reduces blood flow which can cause pain and other serious problems. The presence of PVD can place a person at risk for Coronary Artery Disease (CAD).  CAUSES  Causes of PVD can be many. It is usually associated with more than one risk factor such as:   High Cholesterol.   Smoking.   Diabetes.   Lack of exercise or inactivity.   High blood pressure (hypertension).   Obesity.   Family history.  SYMPTOMS   When the lower extremities are affected, patients with PVD may experience:   Leg pain with exertion or physical activity. This is called INTERMITTENT CLAUDICATION. This may present as cramping or numbness with physical activity. The location of the pain is associated with the level of blockage. For example, blockage at the abdominal level (distal abdominal aorta) may result in buttock or hip pain. Lower leg arterial blockage may result in calf pain.   As PVD becomes more severe, pain can develop with less physical activity.   In people with severe PVD, leg pain may occur at rest.   Other PVD signs and symptoms:   Leg numbness or weakness.   Coldness in the affected leg or foot, especially when compared to the other leg.   A change in leg color.   Patients with significant PVD are more prone to ulcers or sores on toes, feet or legs. These may take longer to heal or may reoccur. The ulcers or sores can become infected.   If signs and symptoms of PVD are ignored, gangrene may occur. This can result in the loss of toes or loss of an entire limb.   Not all leg pain is related to PVD. Other medical conditions can cause leg  pain such as:   Blood clots (embolism) or Deep Vein Thrombosis.   Inflammation of the blood vessels (vasculitis).   Spinal stenosis.  DIAGNOSIS  Diagnosis of PVD can involve several different types of tests. These can include:  Pulse Volume Recording Method (PVR). This test is simple, painless and does not involve the use of X-rays. PVR involves measuring and comparing the blood pressure in the arms and legs. An ABI (Ankle-Brachial Index) is calculated. The normal ratio of blood pressures is 1. As this number becomes smaller, it indicates more severe disease.   < 0.95 - indicates significant narrowing in one or more leg vessels.   <0.8 - there will usually be pain in the foot, leg or buttock with exercise.   <0.4 - will usually have pain in the legs at rest.   <0.25 - usually indicates limb threatening PVD.   Doppler detection of pulses in the legs. This test is painless and checks to see if you have a pulses in your legs/feet.   A dye or contrast material (a substance that highlights the blood vessels so they show up on x-ray) may be given to help your caregiver better see the arteries for the following tests. The dye is eliminated from your body by the kidney's. Your caregiver may order blood work to check your kidney function and other laboratory values before the following tests are performed:   Magnetic Resonance Angiography (MRA). An MRA is a  picture study of the blood vessels and arteries. The MRA machine uses a large magnet to produce images of the blood vessels.   Computed Tomography Angiography (CTA). A CTA is a specialized x-ray that looks at how the blood flows in your blood vessels. An IV may be inserted into your arm so contrast dye can be injected.   Angiogram. Is a procedure that uses x-rays to look at your blood vessels. This procedure is minimally invasive, meaning a small incision (cut) is made in your groin. A small tube (catheter) is then inserted into the artery of  your groin. The catheter is guided to the blood vessel or artery your caregiver wants to examine. Contrast dye is injected into the catheter. X-rays are then taken of the blood vessel or artery. After the images are obtained, the catheter is taken out.  TREATMENT  Treatment of PVD involves many interventions which may include:  Lifestyle changes:   Quitting smoking.   Exercise.   Following a low fat, low cholesterol diet.   Control of diabetes.   Foot care is very important to the PVD patient. Good foot care can help prevent infection.   Medication:   Cholesterol-lowering medicine.   Blood pressure medicine.   Anti-platelet drugs.   Certain medicines may reduce symptoms of Intermittent Claudication.   Interventional/Surgical options:   Angioplasty. An Angioplasty is a procedure that inflates a balloon in the blocked artery. This opens the blocked artery to improve blood flow.   Stent Implant. A wire mesh tube (stent) is placed in the artery. The stent expands and stays in place, allowing the artery to remain open.   Peripheral Bypass Surgery. This is a surgical procedure that reroutes the blood around a blocked artery to help improve blood flow. This type of procedure may be performed if Angioplasty or stent implants are not an option.  SEEK IMMEDIATE MEDICAL CARE IF:   You develop pain or numbness in your arms or legs.   Your arm or leg turns cold, becomes blue in color.   You develop redness, warmth, swelling and pain in your arms or legs.  MAKE SURE YOU:   Understand these instructions.   Will watch your condition.   Will get help right away if you are not doing well or get worse.  Document Released: 12/28/2004 Document Revised: 11/09/2011 Document Reviewed: 11/24/2008 [facility] Patient Information 2012 [place], [place].  Groin Site Care Refer to this sheet in the next few weeks. These instructions provide you with information on caring for yourself after your  procedure. Your caregiver may also give you more specific instructions. Your treatment has been planned according to current medical practices, but problems sometimes occur. Call your caregiver if you have any problems or questions after your procedure. HOME CARE INSTRUCTIONS  You may shower 24 hours after the procedure. Remove the bandage (dressing) and gently wash the site with plain soap and water. Gently pat the site dry.   Do not apply powder or lotion to the site.   Do not sit in a bathtub, swimming pool, or whirlpool for 5 to 7 days.   No bending, squatting, or lifting anything over 10 pounds (4.5 kg) as directed by your caregiver.   Inspect the site at least twice daily.   Do not drive home if you are discharged the same day of the procedure. Have someone else drive you.   You may drive 24 hours after the procedure unless otherwise instructed by your caregiver.  What to  expect:  Any bruising will usually fade within 1 to 2 weeks.   Blood that collects in the tissue (hematoma) may be painful to the touch. It should usually decrease in size and tenderness within 1 to 2 weeks.  SEEK IMMEDIATE MEDICAL CARE IF:  You have unusual pain at the groin site or down the affected leg.   You have redness, warmth, swelling, or pain at the groin site.   You have drainage (other than a small amount of blood on the dressing).   You have chills.   You have a fever or persistent symptoms for more than 72 hours.   You have a fever and your symptoms suddenly get worse.   Your leg becomes pale, cool, tingly, or numb.   You have heavy bleeding from the site. Hold pressure on the site.  Document Released: 12/23/2010 Document Revised: 11/09/2011 Document Reviewed: 12/23/2010 [facility] Patient Information 2012 [place], [place].

## 2012-05-29 NOTE — Progress Notes (Signed)
Dr. Kirke Corin placed post PCI orders in for a post PV intervention. Talked with Dr. Kirke Corin and will d/c non-applicable orders per his verbal order.

## 2012-06-03 HISTORY — PX: ILIAC ARTERY STENT: SHX1786

## 2012-06-13 ENCOUNTER — Other Ambulatory Visit: Payer: Self-pay | Admitting: Cardiology

## 2012-06-13 ENCOUNTER — Encounter (INDEPENDENT_AMBULATORY_CARE_PROVIDER_SITE_OTHER): Payer: Medicaid Other

## 2012-06-13 DIAGNOSIS — I739 Peripheral vascular disease, unspecified: Secondary | ICD-10-CM

## 2012-06-14 ENCOUNTER — Encounter: Payer: Self-pay | Admitting: Cardiovascular Disease

## 2012-06-14 ENCOUNTER — Ambulatory Visit (INDEPENDENT_AMBULATORY_CARE_PROVIDER_SITE_OTHER): Payer: Medicaid Other | Admitting: Cardiovascular Disease

## 2012-06-14 VITALS — BP 124/72 | HR 68 | Ht 67.0 in | Wt 169.0 lb

## 2012-06-14 DIAGNOSIS — I2510 Atherosclerotic heart disease of native coronary artery without angina pectoris: Secondary | ICD-10-CM

## 2012-06-14 DIAGNOSIS — I739 Peripheral vascular disease, unspecified: Secondary | ICD-10-CM

## 2012-06-14 NOTE — Assessment & Plan Note (Signed)
His cardiac catheterization showed no change in coronary anatomy since most recent cardiac catheterization. Continue aggressive medical therapy. His ejection fraction was noted to be low at 35%. He would likely benefit from treatment with an ACE inhibitor and beta blocker as well as a small dose diuretic.

## 2012-06-14 NOTE — Assessment & Plan Note (Addendum)
The patient's claudication has resolved completely after recent angioplasty and stent placement to the left common iliac artery. He had an ABI performed which was greater than 1 on the right side and 0.96 on the left side which is in the normal range. I had a prolonged discussion with him about the importance of smoking cessation. Obviously, the patient had multiple interventions in that area and he is at very high risk for restenosis and future limb loss if he continues to smoke. He is on long-term dual antiplatelet therapy. I will obtain an aortoiliac duplex in 6 months from now with followup with me at that time.

## 2012-06-14 NOTE — Patient Instructions (Addendum)
Schedule aortoiliac Duplex in 6 months.  Continue same medications.  Follow up in 6 months after Duplex.

## 2012-06-14 NOTE — Progress Notes (Signed)
HPI  [name] is a 51 year old gentleman who is here today for a followup visit.  He was seen for peripheral arterial disease. He has a history of coronary artery disease, stent to his RCA in April 2008 with occluded circumflex, bilateral iliac stenting with last stent to his left iliac in May 2010, mild to moderate renal artery stenosis on the left, history of hepatitis C, long alcohol history currently in remission, long history of smoking who continues to smoke, hypertension and hyperlipidemia .  He had significant claudication in left lower extremity with evidence of high-grade restenosis in the common iliac arteries with an ABI of 0.76. He also complained of increased exertional dyspnea. Thus, I performed simultaneous cardiac catheterization as well as aortoiliac and lower extremity angiography. Cardiac catheterization showed known occluded left circumflex with patent stent in the RCA with only mild restenosis. The LAD had no significant disease. Ejection fraction was 35% and left ventricular end-diastolic pressure was 22 mm mercury. He was found to have severe in-stent restenosis in the left common iliac artery without significant infrainguinal disease. He underwent successful angioplasty and self-expanding stent placement without complications and was discharged home the same day. His internal iliac arteries were noted to be occluded. The patient reports complete resolution of his claudication. His dyspnea is unchanged. He denies chest pain.  No Known Allergies   Current Outpatient Prescriptions on File Prior to Visit  Medication Sig Dispense Refill  . ALPRAZolam (XANAX) 1 MG tablet Take 1 mg by mouth. Take one tablet by mouth four times a day as needed for anxiety.       Marland Kitchen amLODipine (NORVASC) 5 MG tablet Take 1 tablet (5 mg total) by mouth daily.  30 tablet  6  . amphetamine-dextroamphetamine (ADDERALL XR) 30 MG 24 hr capsule Take 30 mg by mouth every morning.      Marland Kitchen aspirin 81 MG  EC tablet Take 81 mg by mouth daily.        Marland Kitchen atorvastatin (LIPITOR) 10 MG tablet Take 1 tablet (10 mg total) by mouth daily.  30 tablet  3  . buPROPion (WELLBUTRIN XL) 150 MG 24 hr tablet Take 150 mg by mouth daily.      . clopidogrel (PLAVIX) 75 MG tablet Take 75 mg by mouth daily.      . diazepam (VALIUM) 5 MG tablet Take 5 mg by mouth 2 (two) times daily.      . divalproex (DEPAKOTE) 500 MG EC tablet 500 mg. Takes 3 tablets daily.      Marland Kitchen lithium 300 MG tablet Take 300 mg by mouth 2 (two) times daily.        . pantoprazole (PROTONIX) 40 MG tablet Take 1 tablet (40 mg total) by mouth daily.  90 tablet  3  . promethazine (PHENERGAN) 25 MG tablet Take 25 mg by mouth daily.          Past Medical History  Diagnosis Date  . PAD (peripheral artery disease)   . Hypertension   . Hyperlipidemia   . Coronary artery disease      Past Surgical History  Procedure Date  . Subdural hematoma evacuation via craniotomy   . Neck surgery 01/2011    plate & screw placement   . Cardiac catheterization     Stent - 11     Family History  Problem Relation Age of Onset  . Emphysema Father   . Diabetes Other   . Alcohol abuse Other   . Hypertension  Other   . Hyperlipidemia Other   . Seizures Other      History   Social History  . Marital Status: Divorced    Spouse Name: N/A    Number of Children: N/A  . Years of Education: N/A   Occupational History  . Disabled    Social History Main Topics  . Smoking status: Current Everyday Smoker -- 1.0 packs/day    Types: Cigarettes  . Smokeless tobacco: Not on file  . Alcohol Use: No  . Drug Use: No  . Sexually Active: Not on file   Other Topics Concern  . Not on file   Social History Narrative   16 years Bipolar/ADHD.Pt gets regular exercise.     PHYSICAL EXAM   BP 124/72  Pulse 68  Ht 5\' 7"  (1.702 m)  Wt 169 lb (76.658 kg)  BMI 26.47 kg/m2  Constitutional: He is oriented to person, place, and time. He appears well-developed  and well-nourished. No distress.  HENT: No nasal discharge.  Head: Normocephalic and atraumatic.  Eyes: Pupils are equal and round. Right eye exhibits no discharge. Left eye exhibits no discharge.  Neck: Normal range of motion. Neck supple. No JVD present. No thyromegaly present.  Cardiovascular: Normal rate, regular rhythm, normal heart sounds and. Exam reveals no gallop and no friction rub. No murmur heard.  Pulmonary/Chest: Effort normal and breath sounds normal. No stridor. No respiratory distress. He has no wheezes. He has no rales. He exhibits no tenderness.  Abdominal: Soft. Bowel sounds are normal. He exhibits no distension. There is no tenderness. There is no rebound and no guarding.  Musculoskeletal: Normal range of motion. He exhibits no edema and no tenderness.  Neurological: He is alert and oriented to person, place, and time. Coordination normal.  Skin: Skin is warm and dry. No rash noted. He is not diaphoretic. No erythema. No pallor.  Psychiatric: He has a normal mood and affect. His behavior is normal. Judgment and thought content normal.  Vascular: Femoral pulses is only slightly diminished bilaterally. It was not palpable before on the left side. Distal pulses are palpable also on both sides.  No groin hematoma.      ASSESSMENT AND PLAN

## 2012-06-18 ENCOUNTER — Other Ambulatory Visit: Payer: Self-pay | Admitting: Cardiovascular Disease

## 2012-06-18 MED ORDER — AMLODIPINE BESYLATE 5 MG PO TABS: 5.0000 mg | ORAL_TABLET | Freq: Every day | ORAL | Status: DC

## 2012-06-18 NOTE — Telephone Encounter (Signed)
Refilled Amlodipine.

## 2012-06-28 ENCOUNTER — Telehealth: Payer: Self-pay | Admitting: Cardiovascular Disease

## 2012-06-28 MED ORDER — ATORVASTATIN CALCIUM 10 MG PO TABS: 10.0000 mg | ORAL_TABLET | Freq: Every day | ORAL | Status: DC

## 2012-06-28 NOTE — Telephone Encounter (Signed)
Can we make sure pt is taking his atorvastatin?

## 2012-06-28 NOTE — Telephone Encounter (Signed)
Spoke with pt and he mentioned that he has not been taking the crestor or atorvastatin due to not having any refills. He had not requested a refill and did not know that he needed to be taking the medications. Sent in refill for Atorvastatin.

## 2012-06-28 NOTE — Telephone Encounter (Signed)
Pt called stating that when he was here in May that according to his ppwk that Dr Mariah Milling wanted him to restart Crestor but he has not been taking it that he didn't know he was to take it. If he does want him to take it again please call to [facility] [address] [name]

## 2012-10-04 HISTORY — PX: LIVER BIOPSY: SHX301

## 2012-10-08 ENCOUNTER — Ambulatory Visit: Payer: Medicaid Other | Admitting: Cardiovascular Disease

## 2012-10-09 ENCOUNTER — Ambulatory Visit: Payer: Self-pay | Admitting: Specialist

## 2012-10-09 LAB — PLATELET COUNT: Platelet: 149 10*3/uL — ABNORMAL LOW (ref 150–440)

## 2012-10-09 LAB — PROTIME-INR
INR: 0.9
Prothrombin Time: 12.5 secs (ref 11.5–14.7)

## 2012-10-09 LAB — APTT: Activated PTT: 31.5 secs (ref 23.6–35.9)

## 2012-10-10 LAB — PATHOLOGY REPORT

## 2012-10-14 ENCOUNTER — Encounter: Payer: Self-pay | Admitting: Cardiovascular Disease

## 2012-10-14 ENCOUNTER — Ambulatory Visit (INDEPENDENT_AMBULATORY_CARE_PROVIDER_SITE_OTHER): Payer: Medicaid Other | Admitting: Cardiovascular Disease

## 2012-10-14 VITALS — BP 138/90 | HR 83 | Ht 67.0 in | Wt 170.8 lb

## 2012-10-14 DIAGNOSIS — I739 Peripheral vascular disease, unspecified: Secondary | ICD-10-CM

## 2012-10-14 DIAGNOSIS — I10 Essential (primary) hypertension: Secondary | ICD-10-CM

## 2012-10-14 DIAGNOSIS — I2510 Atherosclerotic heart disease of native coronary artery without angina pectoris: Secondary | ICD-10-CM

## 2012-10-14 NOTE — Progress Notes (Signed)
HPI  [name] is a 51 year old gentleman who is here today for urgent evaluation due to recurrent L LE caludication.   He has a history of coronary artery disease, stent to his RCA in April 2008 with occluded circumflex, bilateral iliac stenting with last stent to his left iliac in July of this year due to severe restenosis,  mild to moderate renal artery stenosis on the left, history of hepatitis C, long alcohol history currently in remission, long history of smoking who continues to smoke, hypertension and hyperlipidemia .  He has recurrent claudication in left lower extremity similar to prior symptoms. He stopped Plavix recently for a week for a liver biopsy.   No Known Allergies   Current Outpatient Prescriptions on File Prior to Visit  Medication Sig Dispense Refill  . amLODipine (NORVASC) 5 MG tablet Take 1 tablet (5 mg total) by mouth daily.  30 tablet  5  . aspirin 81 MG EC tablet Take 81 mg by mouth daily.        Marland Kitchen atorvastatin (LIPITOR) 10 MG tablet Take 1 tablet (10 mg total) by mouth daily.  90 tablet  3  . buPROPion (WELLBUTRIN XL) 150 MG 24 hr tablet Take 150 mg by mouth daily.      . clopidogrel (PLAVIX) 75 MG tablet Take 75 mg by mouth daily.      . divalproex (DEPAKOTE) 500 MG EC tablet 500 mg. Takes 3 tablets daily.      Marland Kitchen lisdexamfetamine (VYVANSE) 70 MG capsule Take 70 mg by mouth every morning.      . lithium 300 MG tablet Take 300 mg by mouth 2 (two) times daily.        . pantoprazole (PROTONIX) 40 MG tablet Take 1 tablet (40 mg total) by mouth daily.  90 tablet  3     Past Medical History  Diagnosis Date  . PAD (peripheral artery disease)   . Hypertension   . Hyperlipidemia   . Coronary artery disease      Past Surgical History  Procedure Date  . Subdural hematoma evacuation via craniotomy   . Neck surgery 01/2011    plate & screw placement   . Cardiac catheterization     Stent - 11     Family History  Problem Relation Age of Onset  .  Emphysema Father   . Diabetes Other   . Alcohol abuse Other   . Hypertension Other   . Hyperlipidemia Other   . Seizures Other      History   Social History  . Marital Status: Divorced    Spouse Name: N/A    Number of Children: N/A  . Years of Education: N/A   Occupational History  . Disabled    Social History Main Topics  . Smoking status: Current Every Day Smoker -- 1.0 packs/day    Types: Cigarettes  . Smokeless tobacco: Not on file  . Alcohol Use: No  . Drug Use: No  . Sexually Active: Not on file   Other Topics Concern  . Not on file   Social History Narrative   17 years Bipolar/ADHD.Pt gets regular exercise.     PHYSICAL EXAM   BP 138/90  Pulse 83  Ht 5\' 7"  (1.702 m)  Wt 170 lb 12 oz (77.452 kg)  BMI 26.74 kg/m2  Constitutional: He is oriented to person, place, and time. He appears well-developed and well-nourished. No distress.  HENT: No nasal discharge.  Head: Normocephalic and atraumatic.  Eyes: Pupils are equal and round. Right eye exhibits no discharge. Left eye exhibits no discharge.  Neck: Normal range of motion. Neck supple. No JVD present. No thyromegaly present.  Cardiovascular: Normal rate, regular rhythm, normal heart sounds and. Exam reveals no gallop and no friction rub. No murmur heard.  Pulmonary/Chest: Effort normal and breath sounds normal. No stridor. No respiratory distress. He has no wheezes. He has no rales. He exhibits no tenderness.  Abdominal: Soft. Bowel sounds are normal. He exhibits no distension. There is no tenderness. There is no rebound and no guarding.  Musculoskeletal: Normal range of motion. He exhibits no edema and no tenderness.  Neurological: He is alert and oriented to person, place, and time. Coordination normal.  Skin: Skin is warm and dry. No rash noted. He is not diaphoretic. No erythema. No pallor.  Psychiatric: He has a normal mood and affect. His behavior is normal. Judgment and thought content normal.    Vascular: Femoral pulses is only slightly diminished on right, not palpable on left.     ASSESSMENT AND PLAN

## 2012-10-14 NOTE — Assessment & Plan Note (Signed)
Recurrent LLE claudication with minimal walking. Unable to feel left femoral pulse. Likely with significant restenosis.  Will get aortoiliac duplex this week and likely proceed with angiography. Continue Aspirin and Plavix.  Will need to consider a covered stent or bypass .

## 2012-10-14 NOTE — Assessment & Plan Note (Signed)
No chest pain. Slightly worsening dyspnea but he continues to smoke.

## 2012-10-14 NOTE — Patient Instructions (Addendum)
Continue same medications.  Keep your appointment for aortoiliac duplex.  I will call you once I review results.

## 2012-10-17 ENCOUNTER — Encounter (INDEPENDENT_AMBULATORY_CARE_PROVIDER_SITE_OTHER): Payer: Medicaid Other

## 2012-10-17 ENCOUNTER — Other Ambulatory Visit: Payer: Self-pay | Admitting: Cardiology

## 2012-10-17 DIAGNOSIS — I739 Peripheral vascular disease, unspecified: Secondary | ICD-10-CM

## 2012-10-17 DIAGNOSIS — I70219 Atherosclerosis of native arteries of extremities with intermittent claudication, unspecified extremity: Secondary | ICD-10-CM

## 2012-10-18 ENCOUNTER — Encounter (HOSPITAL_COMMUNITY): Payer: Self-pay | Admitting: Pharmacy Technician

## 2012-10-18 ENCOUNTER — Telehealth: Payer: Self-pay

## 2012-10-18 ENCOUNTER — Other Ambulatory Visit: Payer: Self-pay

## 2012-10-18 DIAGNOSIS — I739 Peripheral vascular disease, unspecified: Secondary | ICD-10-CM

## 2012-10-18 NOTE — Telephone Encounter (Signed)
Verbal instructions given to pt re: PV procedure scheduled for 10/22/12 Understanding verb Coming in for labs 11/18 at 0900

## 2012-10-18 NOTE — Telephone Encounter (Signed)
Message copied by Marcelle Overlie on Fri Oct 18, 2012  2:02 PM ------      Message from: Lorine Bears A      Created: Fri Oct 18, 2012  1:53 PM      Regarding: RE: PV results       I called the patient and notified him. I scheduled him for Abdominal aortogram and iliac stenting on Tuesday the 19th at 11 am. I already called PV lab.  Please arrange for blood work and give the instructions. No need for chest X ray.             ----- Message -----         From: Marcelle Overlie, RN         Sent: 10/17/2012  10:35 AM           To: Iran Ouch, MD      Subject: FW: PV results                                                       ----- Message -----         From: Milbert Coulter         Sent: 10/17/2012  10:13 AM           To: Iran Ouch, MD, Marcelle Overlie, RN      Subject: PV results                                               Patient has an interval occlusion of left common and external iliac artery stents.  ABI has dropped from .96 to .52.

## 2012-10-21 ENCOUNTER — Ambulatory Visit (INDEPENDENT_AMBULATORY_CARE_PROVIDER_SITE_OTHER): Payer: Medicaid Other

## 2012-10-21 ENCOUNTER — Other Ambulatory Visit: Payer: Self-pay | Admitting: Cardiovascular Disease

## 2012-10-21 DIAGNOSIS — I739 Peripheral vascular disease, unspecified: Secondary | ICD-10-CM

## 2012-10-22 ENCOUNTER — Ambulatory Visit (HOSPITAL_COMMUNITY)
Admission: RE | Admit: 2012-10-22 | Discharge: 2012-10-23 | Disposition: A | Discharge status: Home / Self Care | Payer: Medicaid Other | Source: Ambulatory Visit | Attending: Cardiovascular Disease | Admitting: Cardiovascular Disease

## 2012-10-22 ENCOUNTER — Encounter (HOSPITAL_COMMUNITY)
Admission: RE | Disposition: A | Discharge status: Home / Self Care | Payer: Self-pay | Source: Ambulatory Visit | Attending: Cardiovascular Disease

## 2012-10-22 ENCOUNTER — Encounter (HOSPITAL_COMMUNITY): Payer: Self-pay | Admitting: General Practice

## 2012-10-22 DIAGNOSIS — I70219 Atherosclerosis of native arteries of extremities with intermittent claudication, unspecified extremity: Secondary | ICD-10-CM | POA: Insufficient documentation

## 2012-10-22 DIAGNOSIS — B1920 Unspecified viral hepatitis C without hepatic coma: Secondary | ICD-10-CM | POA: Insufficient documentation

## 2012-10-22 DIAGNOSIS — E785 Hyperlipidemia, unspecified: Secondary | ICD-10-CM | POA: Insufficient documentation

## 2012-10-22 DIAGNOSIS — I743 Embolism and thrombosis of arteries of the lower extremities: Secondary | ICD-10-CM

## 2012-10-22 DIAGNOSIS — I739 Peripheral vascular disease, unspecified: Secondary | ICD-10-CM

## 2012-10-22 DIAGNOSIS — F17200 Nicotine dependence, unspecified, uncomplicated: Secondary | ICD-10-CM | POA: Insufficient documentation

## 2012-10-22 DIAGNOSIS — I745 Embolism and thrombosis of iliac artery: Secondary | ICD-10-CM | POA: Insufficient documentation

## 2012-10-22 DIAGNOSIS — I701 Atherosclerosis of renal artery: Secondary | ICD-10-CM | POA: Insufficient documentation

## 2012-10-22 DIAGNOSIS — I708 Atherosclerosis of other arteries: Secondary | ICD-10-CM | POA: Insufficient documentation

## 2012-10-22 DIAGNOSIS — I2510 Atherosclerotic heart disease of native coronary artery without angina pectoris: Secondary | ICD-10-CM | POA: Insufficient documentation

## 2012-10-22 DIAGNOSIS — I10 Essential (primary) hypertension: Secondary | ICD-10-CM | POA: Insufficient documentation

## 2012-10-22 HISTORY — DX: Anxiety disorder, unspecified: F41.9

## 2012-10-22 HISTORY — DX: Unspecified convulsions: R56.9

## 2012-10-22 HISTORY — DX: Depression, unspecified: F32.A

## 2012-10-22 HISTORY — PX: ILIAC ARTERY STENT: SHX1786

## 2012-10-22 HISTORY — DX: Bipolar disorder, unspecified: F31.9

## 2012-10-22 HISTORY — DX: Alcohol abuse, in remission: F10.11

## 2012-10-22 HISTORY — DX: Acute myocardial infarction, unspecified: I21.9

## 2012-10-22 HISTORY — PX: ABDOMINAL AORTAGRAM: SHX5454

## 2012-10-22 HISTORY — DX: Unspecified viral hepatitis C without hepatic coma: B19.20

## 2012-10-22 HISTORY — DX: Major depressive disorder, single episode, unspecified: F32.9

## 2012-10-22 HISTORY — DX: Schizophrenia, unspecified: F20.9

## 2012-10-22 HISTORY — DX: Tobacco use: Z72.0

## 2012-10-22 LAB — BASIC METABOLIC PANEL
BUN/Creatinine Ratio: 13 (ref 9–20)
BUN: 12 mg/dL (ref 6–24)
CO2: 25 mmol/L (ref 19–28)
Calcium: 9.6 mg/dL (ref 8.7–10.2)
Chloride: 103 mmol/L (ref 97–108)
Creatinine, Ser: 0.92 mg/dL (ref 0.76–1.27)
GFR calc Af Amer: 110 mL/min/{1.73_m2} (ref >59)
GFR calc non Af Amer: 95 mL/min/{1.73_m2} (ref >59)
Glucose: 97 mg/dL (ref 65–99)
Potassium: 5.4 mmol/L — ABNORMAL HIGH (ref 3.5–5.2)
Sodium: 140 mmol/L (ref 134–144)

## 2012-10-22 LAB — CBC WITH DIFFERENTIAL
Basophils Absolute: 0 10*3/uL (ref 0.0–0.2)
Basos: 0 % (ref 0–3)
Eos: 3 % (ref 0–5)
Eosinophils Absolute: 0.2 10*3/uL (ref 0.0–0.4)
HCT: 44.4 % (ref 37.5–51.0)
Hemoglobin: 15.4 g/dL (ref 12.6–17.7)
Immature Grans (Abs): 0 10*3/uL (ref 0.0–0.1)
Immature Granulocytes: 0 % (ref 0–2)
Lymphocytes Absolute: 1.9 10*3/uL (ref 0.7–3.1)
Lymphs: 28 % (ref 14–46)
MCH: 32 pg (ref 26.6–33.0)
MCHC: 34.7 g/dL (ref 31.5–35.7)
MCV: 92 fL (ref 79–97)
Monocytes Absolute: 0.9 10*3/uL (ref 0.1–0.9)
Monocytes: 13 % — ABNORMAL HIGH (ref 4–12)
Neutrophils Absolute: 3.8 10*3/uL (ref 1.4–7.0)
Neutrophils Relative %: 56 % (ref 40–74)
Platelets: 233 10*3/uL (ref 155–379)
RBC: 4.82 x10E6/uL (ref 4.14–5.80)
RDW: 13.3 % (ref 12.3–15.4)
WBC: 6.9 10*3/uL (ref 3.4–10.8)

## 2012-10-22 LAB — POCT ACTIVATED CLOTTING TIME
Activated Clotting Time: 234 seconds
Activated Clotting Time: 274 seconds

## 2012-10-22 LAB — PROTIME-INR
INR: 1 (ref 0.8–1.2)
Prothrombin Time: 10.5 s (ref 9.1–12.0)

## 2012-10-22 SURGERY — ABDOMINAL AORTAGRAM
Anesthesia: LOCAL

## 2012-10-22 MED ORDER — SODIUM CHLORIDE 0.9 % IV SOLN
INTRAVENOUS | Status: DC
  Administered 2012-10-22: 100 mL/h via INTRAVENOUS

## 2012-10-22 MED ORDER — SODIUM CHLORIDE 0.9 % IV SOLN: 250.0000 mL | INTRAVENOUS | Status: DC | PRN

## 2012-10-22 MED ORDER — AMLODIPINE BESYLATE 5 MG PO TABS
5.0000 mg | ORAL_TABLET | Freq: Every day | ORAL | Status: DC
  Administered 2012-10-23: 5 mg via ORAL
  Filled 2012-10-22 (×2): qty 1

## 2012-10-22 MED ORDER — VILAZODONE HCL 40 MG PO TABS
40.0000 mg | ORAL_TABLET | Freq: Every day | ORAL | Status: DC
  Administered 2012-10-23: 40 mg via ORAL
  Filled 2012-10-22 (×2): qty 1

## 2012-10-22 MED ORDER — LISDEXAMFETAMINE DIMESYLATE 70 MG PO CAPS: 70.0000 mg | ORAL_CAPSULE | ORAL | Status: DC

## 2012-10-22 MED ORDER — ASPIRIN 81 MG PO CHEW
CHEWABLE_TABLET | ORAL | Status: AC
  Filled 2012-10-22: qty 4

## 2012-10-22 MED ORDER — ATORVASTATIN CALCIUM 10 MG PO TABS
10.0000 mg | ORAL_TABLET | Freq: Every day | ORAL | Status: DC
  Administered 2012-10-22 – 2012-10-23 (×2): 10 mg via ORAL
  Filled 2012-10-22 (×2): qty 1

## 2012-10-22 MED ORDER — SODIUM CHLORIDE 0.9 % IJ SOLN: 3.0000 mL | Freq: Two times a day (BID) | INTRAMUSCULAR | Status: DC

## 2012-10-22 MED ORDER — MORPHINE SULFATE 4 MG/ML IJ SOLN
4.0000 mg | INTRAMUSCULAR | Status: DC | PRN
  Administered 2012-10-22 – 2012-10-23 (×2): 4 mg via INTRAVENOUS
  Filled 2012-10-22 (×2): qty 1

## 2012-10-22 MED ORDER — LITHIUM CARBONATE 300 MG PO CAPS
300.0000 mg | ORAL_CAPSULE | Freq: Two times a day (BID) | ORAL | Status: DC
  Administered 2012-10-22 – 2012-10-23 (×2): 300 mg via ORAL
  Filled 2012-10-22 (×3): qty 1

## 2012-10-22 MED ORDER — LITHIUM CARBONATE 300 MG PO TABS
300.0000 mg | ORAL_TABLET | Freq: Two times a day (BID) | ORAL | Status: DC
  Filled 2012-10-22 (×2): qty 1

## 2012-10-22 MED ORDER — HEPARIN SODIUM (PORCINE) 1000 UNIT/ML IJ SOLN
INTRAMUSCULAR | Status: AC
  Filled 2012-10-22: qty 1

## 2012-10-22 MED ORDER — ONDANSETRON HCL 4 MG/2ML IJ SOLN
4.0000 mg | Freq: Four times a day (QID) | INTRAMUSCULAR | Status: DC | PRN
  Administered 2012-10-22: 4 mg via INTRAVENOUS
  Filled 2012-10-22: qty 2

## 2012-10-22 MED ORDER — SODIUM CHLORIDE 0.9 % IJ SOLN: 3.0000 mL | INTRAMUSCULAR | Status: DC | PRN

## 2012-10-22 MED ORDER — SODIUM CHLORIDE 0.9 % IV SOLN
INTRAVENOUS | Status: AC
  Administered 2012-10-22: 20:00:00 via INTRAVENOUS

## 2012-10-22 MED ORDER — PANTOPRAZOLE SODIUM 40 MG PO TBEC
40.0000 mg | DELAYED_RELEASE_TABLET | Freq: Every day | ORAL | Status: DC
  Administered 2012-10-22 – 2012-10-23 (×2): 40 mg via ORAL
  Filled 2012-10-22 (×2): qty 1

## 2012-10-22 MED ORDER — CLONAZEPAM 0.5 MG PO TABS
0.5000 mg | ORAL_TABLET | Freq: Four times a day (QID) | ORAL | Status: DC
  Administered 2012-10-22 – 2012-10-23 (×3): 0.5 mg via ORAL
  Filled 2012-10-22 (×3): qty 1

## 2012-10-22 MED ORDER — DIVALPROEX SODIUM 500 MG PO DR TAB
500.0000 mg | DELAYED_RELEASE_TABLET | Freq: Three times a day (TID) | ORAL | Status: DC
  Administered 2012-10-22 – 2012-10-23 (×2): 500 mg via ORAL
  Filled 2012-10-22 (×5): qty 1

## 2012-10-22 MED ORDER — ASPIRIN 81 MG PO CHEW: 324.0000 mg | CHEWABLE_TABLET | ORAL | Status: DC

## 2012-10-22 MED ORDER — ASPIRIN EC 81 MG PO TBEC
81.0000 mg | DELAYED_RELEASE_TABLET | Freq: Every day | ORAL | Status: DC
  Administered 2012-10-23: 81 mg via ORAL
  Filled 2012-10-22 (×2): qty 1

## 2012-10-22 MED ORDER — LISDEXAMFETAMINE DIMESYLATE 70 MG PO CAPS
70.0000 mg | ORAL_CAPSULE | Freq: Every day | ORAL | Status: DC
  Administered 2012-10-23: 10:00:00 70 mg via ORAL
  Filled 2012-10-22: qty 1

## 2012-10-22 MED ORDER — CLOPIDOGREL BISULFATE 300 MG PO TABS
ORAL_TABLET | ORAL | Status: AC
  Filled 2012-10-22: qty 2

## 2012-10-22 MED ORDER — LIDOCAINE HCL (PF) 1 % IJ SOLN
INTRAMUSCULAR | Status: AC
  Filled 2012-10-22: qty 30

## 2012-10-22 MED ORDER — MIDAZOLAM HCL 2 MG/2ML IJ SOLN
INTRAMUSCULAR | Status: AC
  Filled 2012-10-22: qty 2

## 2012-10-22 MED ORDER — BUPROPION HCL ER (XL) 150 MG PO TB24
150.0000 mg | ORAL_TABLET | Freq: Every morning | ORAL | Status: DC
  Administered 2012-10-23: 10:00:00 150 mg via ORAL
  Filled 2012-10-22: qty 1

## 2012-10-22 MED ORDER — CLOPIDOGREL BISULFATE 75 MG PO TABS
75.0000 mg | ORAL_TABLET | Freq: Every day | ORAL | Status: DC
  Administered 2012-10-23: 10:00:00 75 mg via ORAL
  Filled 2012-10-22: qty 1

## 2012-10-22 MED ORDER — FENTANYL CITRATE 0.05 MG/ML IJ SOLN
INTRAMUSCULAR | Status: AC
  Filled 2012-10-22: qty 2

## 2012-10-22 MED ORDER — HEPARIN (PORCINE) IN NACL 2-0.9 UNIT/ML-% IJ SOLN
INTRAMUSCULAR | Status: AC
  Filled 2012-10-22: qty 1000

## 2012-10-22 MED ORDER — ACETAMINOPHEN 325 MG PO TABS
650.0000 mg | ORAL_TABLET | ORAL | Status: DC | PRN
  Filled 2012-10-22: qty 2

## 2012-10-22 NOTE — Progress Notes (Signed)
CLIENT C/O CUT ON RIGHT THIGH AND STATES FELL ON GLASS COFFEE TABLE AND DOES NOT KNOW IF WOUND HAS GLASS IN IT OR NOT; DR ARIDA NOTIFIED  AND PER DR ARIDA WILL HAVE PA SEE CLIENT

## 2012-10-22 NOTE — Interval H&P Note (Signed)
History and Physical Interval Note:  10/22/2012 1:31 PM  [name]  has presented today for surgery, with the diagnosis of claudication  The various methods of treatment have been discussed with the patient and family. After consideration of risks, benefits and other options for treatment, the patient has consented to  Procedure(s) (LRB) with comments: ABDOMINAL AORTAGRAM (N/A) as a surgical intervention .  The patient's history has been reviewed, patient examined, no change in status, stable for surgery.  I have reviewed the patient's chart and labs.  Questions were answered to the patient's satisfaction.     Lorine Bears

## 2012-10-22 NOTE — Progress Notes (Signed)
   S: CTSP 2/2 right thigh laceration.  Pt says that he fell onto a glass coffee table about 4 days ago and has had some discomfort since.  He has had no fevers/chills.  He's not sure if he has glass in it or not. O:   Filed Vitals:   10/22/12 0909  BP: 139/88  Pulse: 71  Temp: 96.8 F (36 C)  Resp: 18   Pleasant, nad, aaox3.  Mid Right lateral thigh has a 1/4" x 1" laceration without significant erythema/heat.  Appears to be healing well. No evidence of foreign body upon external evaluation.  A/P: R Thigh laceration:  Appears to be healing well.  No active bleeding, heat, erythema, fevers.  Continue conservative measures.  Will not interfere with scheduled procedure.  Nicolasa Ducking, NP

## 2012-10-22 NOTE — CV Procedure (Signed)
PERIPHERAL VASCULAR PROCEDURE  NAME:  [name]   MRN: [id number] DOB:  12/29/1960   ADMIT DATE: 10/22/2012  Performing Cardiologist: Lorine Bears Primary Physician: Veneda Melter, MD Primary Cardiologist:  Dossie Arbour, MD   Procedures Performed:  Distal Abdominal Aortic Angiogram with bilateral iliac angiography.  Aspiration Thrombectomy of the left common iliac and external iliac arteries.  Covered stent placement to the left common iliac artery.  Self expanding stent placement to the distal left external iliac artery.  Mynx closure device.  Indication(s):   Claudication  This is a 51 year old male with known history of PAD. He had multiple interventions and stent placement to the iliac arteries bilaterally. Most recently in June of 2013 where he was found to have severe in-stent restenosis. He had balloon angioplasty at that time followed by self-expanding stent placement. The patient recently stopped taking aspirin and Plavix for a biopsy. He was supposed to resume after one week but he has not been taking these 2 medications for a few weeks. Around the same time he had recurrent left lower extremity claudication. He came to see me last week. Duplex ultrasound showed occluded left iliac artery with significant drop in on his ABI. He is known to have occluded internal iliac arteries bilaterally.   Consent: The procedure with Risks/Benefits/Alternatives and Indications was reviewed with the patient .  All questions were answered.  Medications:  Sedation:  2 mg IV Versed, 200 mcg IV Fentanyl  Contrast:  140 mL Visipaque  Procedural details: The left groin was prepped, draped, and anesthetized with 1% lidocaine. Using modified Seldinger technique, a micropuncture sheath was used with a micropuncture needle. 5 French sheath was introduced into the left common femoral artery.  A 5 Fr Short Pigtail Catheter was advanced of over a  Versicore wire into the descending  Aorta to a level below the renal arteries. A power injection of 60ml/sec contrast over 1 sec was performed for Abdominal Aortic Angiography and iliac angiography.   This showed occluded left common iliac and external iliac arteries. Flushing the sheath was difficult with suspected thrombosis. Aspiration was performed and the sheath and large amount of thrombus was retrieved.    Interventional Procedure:   the 5 French sheath was exchanged to a 7 [place] sheath. He was given a total of 8000 units of IV heparin with an ACT maintained above 200.  An 014 wire was used to cross the lesion followed by advancement of the Pronto thrombectomy catheter with multiple runs performed . This resulted in retrieval of large amount of dark and then later white thrombus. Angiography showed establishing some flow within the stents. I then used a 6 mm balloon with low-inflation throughout the stents. This was then upgraded to a 7 mm balloon with multiple inflations. Angiography showed significant residual stenosis and at the ostium of the left common iliac artery as well as severe stenosis in the left external iliac artery distal to the previously placed stents. I thus decided to use a covered stent to treat the lesion in the left common iliac artery all the way back to the ostium. I used an 8 x 59 mm i cast covered stent which was deployed to 10 atmosphere. I then used an 8 x 40 mm EPIC self-expanding stent which was placed in the distal left external iliac artery. This was post dilated with a 7 mm balloon. Final angiography showed excellent results.  I did angiography of the TP trunk distally which showed no evidence  of distal embolization. The sheath was then removed and the site was closed with a Mynx device.   Hemodynamics:  Central Aortic Pressure / Mean Aortic Pressure: 122/667   Findings:  Abdominal aorta:  no significant disease in the distal aorta.   Right common iliac artery: Patent with no significant  restenosis.   Right internal iliac artery: Occluded.   Right external iliac artery: Minor restenosis.   Right common femoral artery:  Minor irregularities.  Left common iliac artery:   occluded at the ostium all the way to the external iliac.  Left internal iliac artery:  known chronically occluded  Left external iliac artery:  occluded.  Left common femoral artery: Minor irregularities.   Conclusions:  1. Occluded left common iliac and left external iliac stents with large amount of old organized thrombus. This is at least a month old. The patient stopped due antiplatelet therapy around that time.   2. Successful aspiration thrombectomy of left common iliac and external iliac arteries. 3. Covered stent placement to the left common iliac artery and self-expanding stent placement to the left external iliac artery. No evidence of distal embolization.  Recommendations:  recommend dual antiplatelet therapy indefinitely if possible. If the patient develops recurrent restenosis or thrombosis in this area, he will need surgical bypass due to multiple previous interventions. He will be observed overnight due to length of procedure.    Lorine Bears, MD, [facility] 10/22/2012 3:55 PM

## 2012-10-22 NOTE — H&P (View-Only) (Signed)
  HPI  [name] is a 51-year-old gentleman who is here today for urgent evaluation due to recurrent L LE caludication.   He has a history of coronary artery disease, stent to his RCA in April 2008 with occluded circumflex, bilateral iliac stenting with last stent to his left iliac in July of this year due to severe restenosis,  mild to moderate renal artery stenosis on the left, history of hepatitis C, long alcohol history currently in remission, long history of smoking who continues to smoke, hypertension and hyperlipidemia .  He has recurrent claudication in left lower extremity similar to prior symptoms. He stopped Plavix recently for a week for a liver biopsy.   No Known Allergies   Current Outpatient Prescriptions on File Prior to Visit  Medication Sig Dispense Refill  . amLODipine (NORVASC) 5 MG tablet Take 1 tablet (5 mg total) by mouth daily.  30 tablet  5  . aspirin 81 MG EC tablet Take 81 mg by mouth daily.        . atorvastatin (LIPITOR) 10 MG tablet Take 1 tablet (10 mg total) by mouth daily.  90 tablet  3  . buPROPion (WELLBUTRIN XL) 150 MG 24 hr tablet Take 150 mg by mouth daily.      . clopidogrel (PLAVIX) 75 MG tablet Take 75 mg by mouth daily.      . divalproex (DEPAKOTE) 500 MG EC tablet 500 mg. Takes 3 tablets daily.      . lisdexamfetamine (VYVANSE) 70 MG capsule Take 70 mg by mouth every morning.      . lithium 300 MG tablet Take 300 mg by mouth 2 (two) times daily.        . pantoprazole (PROTONIX) 40 MG tablet Take 1 tablet (40 mg total) by mouth daily.  90 tablet  3     Past Medical History  Diagnosis Date  . PAD (peripheral artery disease)   . Hypertension   . Hyperlipidemia   . Coronary artery disease      Past Surgical History  Procedure Date  . Subdural hematoma evacuation via craniotomy   . Neck surgery 01/2011    plate & screw placement   . Cardiac catheterization     Stent - 12     Family History  Problem Relation Age of Onset  .  Emphysema Father   . Diabetes Other   . Alcohol abuse Other   . Hypertension Other   . Hyperlipidemia Other   . Seizures Other      History   Social History  . Marital Status: Divorced    Spouse Name: N/A    Number of Children: N/A  . Years of Education: N/A   Occupational History  . Disabled    Social History Main Topics  . Smoking status: Current Every Day Smoker -- 1.0 packs/day    Types: Cigarettes  . Smokeless tobacco: Not on file  . Alcohol Use: No  . Drug Use: No  . Sexually Active: Not on file   Other Topics Concern  . Not on file   Social History Narrative   17 years Bipolar/ADHD.Pt gets regular exercise.     PHYSICAL EXAM   BP 138/90  Pulse 83  Ht 5' 7" (1.702 m)  Wt 170 lb 12 oz (77.452 kg)  BMI 26.74 kg/m2  Constitutional: He is oriented to person, place, and time. He appears well-developed and well-nourished. No distress.  HENT: No nasal discharge.  Head: Normocephalic and atraumatic.    Eyes: Pupils are equal and round. Right eye exhibits no discharge. Left eye exhibits no discharge.  Neck: Normal range of motion. Neck supple. No JVD present. No thyromegaly present.  Cardiovascular: Normal rate, regular rhythm, normal heart sounds and. Exam reveals no gallop and no friction rub. No murmur heard.  Pulmonary/Chest: Effort normal and breath sounds normal. No stridor. No respiratory distress. He has no wheezes. He has no rales. He exhibits no tenderness.  Abdominal: Soft. Bowel sounds are normal. He exhibits no distension. There is no tenderness. There is no rebound and no guarding.  Musculoskeletal: Normal range of motion. He exhibits no edema and no tenderness.  Neurological: He is alert and oriented to person, place, and time. Coordination normal.  Skin: Skin is warm and dry. No rash noted. He is not diaphoretic. No erythema. No pallor.  Psychiatric: He has a normal mood and affect. His behavior is normal. Judgment and thought content normal.    Vascular: Femoral pulses is only slightly diminished on right, not palpable on left.     ASSESSMENT AND PLAN   

## 2012-10-23 ENCOUNTER — Encounter (HOSPITAL_COMMUNITY): Payer: Self-pay | Admitting: Cardiology

## 2012-10-23 DIAGNOSIS — I739 Peripheral vascular disease, unspecified: Secondary | ICD-10-CM

## 2012-10-23 LAB — BASIC METABOLIC PANEL
BUN: 11 mg/dL (ref 6–23)
CO2: 27 mEq/L (ref 19–32)
Calcium: 8.9 mg/dL (ref 8.4–10.5)
Chloride: 101 mEq/L (ref 96–112)
Creatinine, Ser: 0.9 mg/dL (ref 0.50–1.35)
GFR calc Af Amer: >90 mL/min (ref >90)
GFR calc non Af Amer: >90 mL/min (ref >90)
Glucose, Bld: 95 mg/dL (ref 70–99)
Potassium: 3.8 mEq/L (ref 3.5–5.1)
Sodium: 134 mEq/L — ABNORMAL LOW (ref 135–145)

## 2012-10-23 MED ORDER — OXYCODONE-ACETAMINOPHEN 5-325 MG PO TABS: 1.0000 | ORAL_TABLET | ORAL | Status: DC | PRN

## 2012-10-23 MED ORDER — OXYCODONE-ACETAMINOPHEN 5-325 MG PO TABS
1.0000 | ORAL_TABLET | Freq: Once | ORAL | Status: AC
  Administered 2012-10-23: 10:00:00 1 via ORAL
  Filled 2012-10-23: qty 1

## 2012-10-23 MED ORDER — CLOPIDOGREL BISULFATE 75 MG PO TABS: 75.0000 mg | ORAL_TABLET | Freq: Every day | ORAL | Status: DC

## 2012-10-23 NOTE — Progress Notes (Signed)
    I have seen and examined the patient. I agree with the above note with the addition of : no groin hematoma. He is able to walk. He is to continue Aspirin and Plavix longterm. Discharge home today.   Lorine Bears MD, [facility] 10/23/2012 5:24 PM

## 2012-10-23 NOTE — Progress Notes (Signed)
Assessed for utilization review 

## 2012-10-23 NOTE — Progress Notes (Signed)
Cardiology Progress Note Patient Name: [name] Date of Encounter: 10/23/2012, 7:51 AM     Subjective  Patient had on episode of emesis last night after dinner. ? R/t meal vs morphine administration. Feels better this morning. Still with soreness to groin site.  Able to ambulate without claudication   Objective   Telemetry: Sinus rhythm 50-70s  Medications: . amLODipine  5 mg Oral Daily  . [COMPLETED] aspirin      . aspirin EC  81 mg Oral Daily  . atorvastatin  10 mg Oral Daily  . buPROPion  150 mg Oral q morning - 10a  . clonazePAM  0.5 mg Oral QID  . [COMPLETED] clopidogrel      . clopidogrel  75 mg Oral Daily  . divalproex  500 mg Oral TID  . [COMPLETED] fentaNYL      . [COMPLETED] fentaNYL      . [COMPLETED] heparin      . [COMPLETED] heparin      . [COMPLETED] lidocaine      . lisdexamfetamine  70 mg Oral Daily  . lithium carbonate  300 mg Oral BID  . [COMPLETED] midazolam      . pantoprazole  40 mg Oral Daily  . Vilazodone HCl  40 mg Oral Daily  . [DISCONTINUED] aspirin  324 mg Oral Pre-Cath  . [DISCONTINUED] lisdexamfetamine  70 mg Oral BH-q7a  . [DISCONTINUED] lithium  300 mg Oral BID  . [DISCONTINUED] sodium chloride  3 mL Intravenous Q12H    Physical Exam: Temp:  [96.8 F (36 C)-97.5 F (36.4 C)] 97.5 F (36.4 C) (11/20 0518) Pulse Rate:  [60-71] 64  (11/20 0518) Resp:  [11-22] 15  (11/20 0518) BP: (101-139)/(60-88) 101/60 mmHg (11/20 0518) SpO2:  [93 %-99 %] 93 % (11/20 0518) Weight:  [166 lb 10.7 oz (75.6 kg)-170 lb (77.111 kg)] 166 lb 10.7 oz (75.6 kg) (11/20 0041)  General: Middle-aged white male, in no acute distress. Head: Normocephalic, atraumatic, sclera non-icteric, nares are without discharge.  Neck: Supple. Negative for carotid bruits or JVD Lungs: Clear bilaterally to auscultation without wheezes, rales, or rhonchi. Breathing is unlabored. Heart: RRR S1 S2 without murmurs, rubs, or gallops.  Abdomen: Soft, non-tender,  non-distended with normoactive bowel sounds. No rebound/guarding. No obvious abdominal masses. Msk:  Strength and tone appear normal for age. Extremities: Left groin without hematoma or bruit. Mild tenderness to palpation. No edema. No clubbing or cyanosis. Distal pedal pulses are intact and equal bilaterally. Neuro: Alert and oriented X 3. Moves all extremities spontaneously. Psych:  Responds to questions appropriately with a flat affect.   Intake/Output Summary (Last 24 hours) at 10/23/12 0751 Last data filed at 10/23/12 0518  Gross per 24 hour  Intake   1040 ml  Output   1275 ml  Net   -235 ml    Labs:  [facility] 10/21/12 0846  NA 140  K 5.4*  CL 103  CO2 25  GLUCOSE 97  BUN 12  CREATININE 0.92  CALCIUM 9.6  MG --  PHOS --   Basename 10/21/12 0846  WBC 6.9  NEUTROABS 3.8  HGB 15.4  HCT 44.4  MCV 92  PLT 233   Radiology/Studies:   10/22/12 - Peripheral Vascular Study Procedures Performed:  Distal Abdominal Aortic Angiogram with bilateral iliac angiography.  Aspiration Thrombectomy of the left common iliac and external iliac arteries.  Covered stent placement to the left common iliac artery.  Self expanding stent placement to the  distal left external iliac artery.  Mynx closure device.  Hemodynamics:  Central Aortic Pressure / Mean Aortic Pressure: 122/667  Findings:  Abdominal aorta: no significant disease in the distal aorta.  Right common iliac artery: Patent with no significant restenosis.  Right internal iliac artery: Occluded.  Right external iliac artery: Minor restenosis.  Right common femoral artery: Minor irregularities.  Left common iliac artery: occluded at the ostium all the way to the external iliac.  Left internal iliac artery: known chronically occluded  Left external iliac artery: occluded.  Left common femoral artery: Minor irregularities.  Conclusions:  1. Occluded left common iliac and left external iliac stents with large amount of old  organized thrombus. This is at least a month old. The patient stopped due antiplatelet therapy around that time.  2. Successful aspiration thrombectomy of left common iliac and external iliac arteries.  3. Covered stent placement to the left common iliac artery and self-expanding stent placement to the left external iliac artery. No evidence of distal embolization.  Recommendations:  recommend dual antiplatelet therapy indefinitely if possible. If the patient develops recurrent restenosis or thrombosis in this area, he will need surgical bypass due to multiple previous interventions. He will be observed overnight due to length of procedure.     Assessment and Plan   1. Peripheral Artery Disease w/ recurrent claudication of LLE due to ISR of left iliac artery after stopping Plavix for a liver biopsy. S/p aspiration thrombectomy of left common iliac and external iliac arteries and covered stent placement to the left common iliac artery and self-expanding stent placement to the left external iliac artery. Recs for lifelong DAPT and tobacco cessation. Groin site stable. Able to ambulate without claudication 2. Coronary Artery Disease: Stable. Cont ASA, Plavix, statin 3. Tobacco Abuse: Educated on importance of cessation 4. Hypertension: BP stable 5. Hyperlipidemia: Cont statin  Disposition: Likely home today pending evaluation by Dr. Kirke Corin   Signed, Tahir Blank PA-C

## 2012-10-23 NOTE — Progress Notes (Signed)
Patient ambulated in hall independently with steady gait. Tolerated activity well with no complaint of buttock or leg pain present prior to intervention. Dr. Kirke Corin aware.

## 2012-10-23 NOTE — Discharge Summary (Signed)
Discharge Summary   Patient ID: [name] MRN: [id number], DOB/AGE: [birth year] 51 y.o.  Primary MD: Veneda Melter, MD Primary Cardiologist: Lorine Bears MD Admit date: 10/22/2012 D/C date:     10/23/2012      Primary Discharge Diagnoses:  1. Peripheral Artery Disease   - PV study showed ISR of left iliac artery; s/p aspiration thrombectomy of left common/external iliac arteries and covered stent placement to the left common iliac artery and self-expanding stent placement to the left external iliac artery  - Recs for lifelong DAPT  - Repeat ABIs in the office in one week  2. Tobacco Abuse   Secondary Discharge Diagnoses:  . Hypertension   . Hyperlipidemia   . Coronary artery disease     s/p PCI to ?artery at [facility]; s/p PCI to RCA '08; Cath 05/2012 showed known occluded LCx, RCA stent w/ mild restenosis, LAD no significant dz, EF 35%   . Myocardial infarction 1996; 1997; 2000's    "total of 3" (10/22/2012)  . Hepatitis C     "diagnosed in the past 2 wk" (10/22/2012)  . Seizures     "when I get anxious" (10/22/2012)  . Anxiety   . Depression   . Bipolar affective   . Schizophrenia   . PAD (peripheral artery disease)     2010 PTA & stent to left CIA & left SFA; 05/2012 PTA & stent to distal LCIA; 10/2012 thrombectomy and stents to LCIA and LEIA  . Renal artery stenosis     mild to moderate renal artery stenosis on the left  . History of ETOH abuse     quit 2007    Allergies No Known Allergies  Diagnostic Studies/Procedures:  10/22/12 - Peripheral Vascular Study  Procedures Performed:  Distal Abdominal Aortic Angiogram with bilateral iliac angiography.  Aspiration Thrombectomy of the left common iliac and external iliac arteries.  Covered stent placement to the left common iliac artery.  Self expanding stent placement to the distal left external iliac artery.  Mynx closure device. Hemodynamics:  Central Aortic Pressure / Mean Aortic Pressure:  122/667  Findings:  Abdominal aorta: no significant disease in the distal aorta.  Right common iliac artery: Patent with no significant restenosis.  Right internal iliac artery: Occluded.  Right external iliac artery: Minor restenosis.  Right common femoral artery: Minor irregularities.  Left common iliac artery: occluded at the ostium all the way to the external iliac.  Left internal iliac artery: known chronically occluded  Left external iliac artery: occluded.  Left common femoral artery: Minor irregularities.  Conclusions:  1. Occluded left common iliac and left external iliac stents with large amount of old organized thrombus. This is at least a month old. The patient stopped due antiplatelet therapy around that time.  2. Successful aspiration thrombectomy of left common iliac and external iliac arteries.  3. Covered stent placement to the left common iliac artery and self-expanding stent placement to the left external iliac artery. No evidence of distal embolization.  Recommendations:  recommend dual antiplatelet therapy indefinitely if possible. If the patient develops recurrent restenosis or thrombosis in this area, he will need surgical bypass due to multiple previous interventions. He will be observed overnight due to length of procedure.    History of Present Illness: 51 y.o. male w/ the above medical problems who presented to [facility] on 10/22/12 for peripheral vascular study due to recurrent claudication of LLE and reduced ABI. He has a history of bilateral iliac stenting with  the last stent to his left iliac in July of this year due to severe restenosis. He stopped Plavix for a week for a liver biopsy and had recurrent claudication in left lower extremity for which ABI was performed and showed ABI dropped from .96 to .52.   Hospital Course: He arrived to [facility] on 10/22/12 in stable condition. He was noted to have a 1/4" x 1" laceration to his right lateral  thigh from falling on a glass table four days prior to admission. It was without significant erythema/heat and appeared to be healing well without evidence of foreign body. He was felt stable to proceed with PV study. PV study with findings as above treated with aspiration thrombectomy of left common/external iliac arteries and covered stent placement to the left common iliac artery and self-expanding stent placement to the left external iliac artery. He tolerated the procedure well without complications. Recommendations were made for lifelong DAPT as well as tobacco cessation. He was able to ambulate without further claudication. Cath site remained stable. He was seen and evaluated by Dr. Kirke Corin who felt he was stable for discharge home with plans for follow up as scheduled below.  Discharge Vitals: Blood pressure 121/73, pulse 73, temperature 97.7 F (36.5 C), temperature source Oral, resp. rate 15, height 5\' 7"  (1.702 m), weight 166 lb 10.7 oz (75.6 kg), SpO2 93.00%.  Labs: Component Value Date   WBC 6.9 10/21/2012   HGB 15.4 10/21/2012   HCT 44.4 10/21/2012   MCV 92 10/21/2012   PLT 233 10/21/2012    Lab 10/23/12 1008  NA 134*  K 3.8  CL 101  CO2 27  BUN 11  CREATININE 0.90  CALCIUM 8.9  GLUCOSE 95     Discharge Medications     Medication List     As of 10/23/2012 11:15 AM    TAKE these medications         amLODipine 5 MG tablet   Commonly known as: NORVASC   Take 1 tablet (5 mg total) by mouth daily.      aspirin 81 MG EC tablet   Take 81 mg by mouth daily.      atorvastatin 10 MG tablet   Commonly known as: LIPITOR   Take 1 tablet (10 mg total) by mouth daily.      buPROPion 150 MG 24 hr tablet   Commonly known as: WELLBUTRIN XL   Take 150 mg by mouth every morning.      clonazePAM 0.5 MG tablet   Commonly known as: KLONOPIN   Take 0.5 mg by mouth 4 (four) times daily.      clopidogrel 75 MG tablet   Commonly known as: PLAVIX   Take 1 tablet (75 mg total)  by mouth daily.      divalproex 500 MG DR tablet   Commonly known as: DEPAKOTE   Take 500 mg by mouth 3 (three) times daily.      lisdexamfetamine 70 MG capsule   Commonly known as: VYVANSE   Take 70 mg by mouth every morning.      lithium 300 MG tablet   Take 300 mg by mouth 2 (two) times daily.      oxyCODONE-acetaminophen 5-325 MG per tablet   Commonly known as: PERCOCET/ROXICET   Take 1 tablet by mouth every 4 (four) hours as needed for pain.      pantoprazole 40 MG tablet   Commonly known as: PROTONIX   Take 1 tablet (40 mg  total) by mouth daily.      VIIBRYD 40 MG Tabs   Generic drug: Vilazodone HCl   Take 40 mg by mouth daily.          Disposition   Discharge Orders    Future Appointments: Provider: Department: Dept Phone: Center:   10/29/2012 8:30 AM [facility] [place] [phone] [facility]   11/07/2012 10:15 AM Iran Ouch, MD [facility] [place] [phone] [facility]     Future Orders Please Complete By Expires   Diet - low sodium heart healthy      Increase activity slowly      Discharge instructions      Comments:   * KEEP GROIN SITE CLEAN AND DRY. Call the office for any signs of bleedings, pus, swelling, increased pain, or any other concerns. * NO HEAVY LIFTING (>10lbs) OR SEXUAL ACTIVITY X 7 DAYS. * NO DRIVING X 3-5 DAYS. * NO SOAKING BATHS, HOT TUBS, POOLS, ETC., X 7 DAYS.  * Keep up the good work with quitting smoking!     Follow-up Information    Follow up with [facility]. (ABIs @ 8:30)    Contact information:   [address] [place] [address] [phone]       Follow up with Lorine Bears, MD. On 11/07/2012. (10:15)    Contact information:    [facility] [address] [place] [address] [phone]           Outstanding Labs/Studies:  1. ABI in office 1 week  Duration of Discharge Encounter: Greater than 30 minutes  including physician and PA time.  Signed, Jahmil Macleod PA-C 10/23/2012, 11:15 AM

## 2012-10-24 ENCOUNTER — Ambulatory Visit: Payer: Medicaid Other | Admitting: Cardiovascular Disease

## 2012-10-28 ENCOUNTER — Other Ambulatory Visit: Payer: Self-pay | Admitting: Cardiology

## 2012-10-28 DIAGNOSIS — I739 Peripheral vascular disease, unspecified: Secondary | ICD-10-CM

## 2012-10-29 ENCOUNTER — Encounter (INDEPENDENT_AMBULATORY_CARE_PROVIDER_SITE_OTHER): Payer: Medicaid Other

## 2012-10-29 DIAGNOSIS — I739 Peripheral vascular disease, unspecified: Secondary | ICD-10-CM

## 2012-11-07 ENCOUNTER — Encounter: Payer: Self-pay | Admitting: Cardiovascular Disease

## 2012-11-07 ENCOUNTER — Ambulatory Visit (INDEPENDENT_AMBULATORY_CARE_PROVIDER_SITE_OTHER): Payer: Medicaid Other | Admitting: Cardiovascular Disease

## 2012-11-07 VITALS — BP 130/80 | HR 68 | Ht 67.0 in | Wt 164.8 lb

## 2012-11-07 DIAGNOSIS — I2510 Atherosclerotic heart disease of native coronary artery without angina pectoris: Secondary | ICD-10-CM

## 2012-11-07 DIAGNOSIS — I10 Essential (primary) hypertension: Secondary | ICD-10-CM

## 2012-11-07 DIAGNOSIS — F17200 Nicotine dependence, unspecified, uncomplicated: Secondary | ICD-10-CM

## 2012-11-07 DIAGNOSIS — I739 Peripheral vascular disease, unspecified: Secondary | ICD-10-CM

## 2012-11-07 NOTE — Assessment & Plan Note (Signed)
I discussed extensively the importance of smoking cessation. He will try to quit with an electronic cigarette. He is already on Wellbutrin

## 2012-11-07 NOTE — Assessment & Plan Note (Signed)
He is doing well after her recent thrombectomy and stent placements to the left common iliac artery and left external iliac artery. The patient had multiple stents in that same area. If he develops significant restenosis or occlusion, his best option might be surgical revascularization. I had a prolonged discussion with him about the importance of keeping him on dual antiplatelet therapy without interruption for at least a year. If interruption is needed, I might elect to admit him to the hospital for anticoagulation. I also had a prolonged discussion with him about the importance of smoking cessation. ABI after the procedure was normal. Followup aortoiliac duplex in 3 months.

## 2012-11-07 NOTE — Progress Notes (Signed)
HPI  [name] is a 51 year old gentleman who is here today for a followup visit regarding PAD.  He has a history of coronary artery disease, stent to his RCA in April 2008 with occluded circumflex, bilateral iliac stenting with last stent to his left iliac in July of this year due to severe restenosis,  mild to moderate renal artery stenosis on the left, history of hepatitis C, long alcohol history currently in remission, long history of smoking who continues to smoke, hypertension and hyperlipidemia .  He was seen recently for recurrent left lower extremity claudication. Carotid duplex ultrasound showed  occluded stents in left common iliac and external iliac arteries. That was in the setting of interruption of dual antiplatelet therapy for a biopsy. He underwent angiography which showed thrombotic occlusion of the left common and external iliac artery stents with extensive old organized thrombus. He underwent successful thrombectomy. There was residual stenosis at the left common iliac artery which was treated with a covered stent. There was also significant disease in the distal left external iliac artery which was treated with self-expanding stent. Since then, he reports resolution of all claudication.  No Known Allergies   Current Outpatient Prescriptions on File Prior to Visit  Medication Sig Dispense Refill  . amLODipine (NORVASC) 5 MG tablet Take 1 tablet (5 mg total) by mouth daily.  30 tablet  5  . aspirin 81 MG EC tablet Take 81 mg by mouth daily.        Marland Kitchen atorvastatin (LIPITOR) 10 MG tablet Take 1 tablet (10 mg total) by mouth daily.  90 tablet  3  . clonazePAM (KLONOPIN) 0.5 MG tablet Take 0.5 mg by mouth 4 (four) times daily.      . clopidogrel (PLAVIX) 75 MG tablet Take 1 tablet (75 mg total) by mouth daily.  30 tablet  11  . divalproex (DEPAKOTE) 500 MG EC tablet Take 500 mg by mouth 3 (three) times daily.       Marland Kitchen lisdexamfetamine (VYVANSE) 70 MG capsule Take 70 mg by mouth  every morning.      . lithium 300 MG tablet Take 300 mg by mouth 2 (two) times daily.        . pantoprazole (PROTONIX) 40 MG tablet Take 1 tablet (40 mg total) by mouth daily.  90 tablet  3  . Vilazodone HCl (VIIBRYD) 40 MG TABS Take 40 mg by mouth daily.         Past Medical History  Diagnosis Date  . Hypertension   . Hyperlipidemia   . Coronary artery disease     s/p PCI to ?artery at [facility]; s/p PCI to RCA '08; Cath 05/2012 showed known occluded LCx, RCA stent w/ mild restenosis, LAD no significant dz, EF 35%   . Myocardial infarction 1996; 1997; 2000's    "total of 3" (10/22/2012)  . Hepatitis C     "diagnosed in the past 2 wk" (10/22/2012)  . Seizures     "when I get anxious" (10/22/2012)  . Anxiety   . Depression   . Bipolar affective   . Schizophrenia   . PAD (peripheral artery disease)     2010 PTA & stent to left CIA & left SFA; 05/2012 PTA & stent to distal LCIA; 10/2012 thrombectomy and stents to LCIA and LEIA  . Renal artery stenosis     mild to moderate renal artery stenosis on the left  . History of ETOH abuse     quit 2007  .  Tobacco abuse      Past Surgical History  Procedure Date  . Subdural hematoma evacuation via craniotomy   . Anterior cervical decomp/discectomy fusion 01/2011    plate & screw placement   . Iliac artery stent 10/22/2012    "2" (10/22/2012)  . Brain surgery   . Posterior fusion cervical spine 2012  . Liver biopsy 10/2012  . Cardiac catheterization     Stent - 12  . Coronary angioplasty with stent placement 03/2007    to RCA  . Iliac artery stent 06/2012    left     Family History  Problem Relation Age of Onset  . Emphysema Father   . Diabetes Other   . Alcohol abuse Other   . Hypertension Other   . Hyperlipidemia Other   . Seizures Other      History   Social History  . Marital Status: Divorced    Spouse Name: N/A    Number of Children: N/A  . Years of Education: N/A   Occupational History  . Disabled     Social History Main Topics  . Smoking status: Current Every Day Smoker -- 1.0 packs/day for 40 years    Types: Cigarettes  . Smokeless tobacco: Never Used  . Alcohol Use: No     Comment: 10/22/2012 "last drink was 5-7 yr ago; used to have a problem w/it"  . Drug Use: No  . Sexually Active: Yes   Other Topics Concern  . Not on file   Social History Narrative   17 years Bipolar/ADHD.Pt gets regular exercise.     PHYSICAL EXAM   BP 130/80  Pulse 68  Ht 5\' 7"  (1.702 m)  Wt 164 lb 12 oz (74.73 kg)  BMI 25.80 kg/m2  Constitutional: He is oriented to person, place, and time. He appears well-developed and well-nourished. No distress.  HENT: No nasal discharge.  Head: Normocephalic and atraumatic.  Eyes: Pupils are equal and round. Right eye exhibits no discharge. Left eye exhibits no discharge.  Neck: Normal range of motion. Neck supple. No JVD present. No thyromegaly present.  Cardiovascular: Normal rate, regular rhythm, normal heart sounds and. Exam reveals no gallop and no friction rub. No murmur heard.  Pulmonary/Chest: Effort normal and breath sounds normal. No stridor. No respiratory distress. He has no wheezes. He has no rales. He exhibits no tenderness.  Abdominal: Soft. Bowel sounds are normal. He exhibits no distension. There is no tenderness. There is no rebound and no guarding.  Musculoskeletal: Normal range of motion. He exhibits no edema and no tenderness.  Neurological: He is alert and oriented to person, place, and time. Coordination normal.  Skin: Skin is warm and dry. No rash noted. He is not diaphoretic. No erythema. No pallor.  Psychiatric: He has a normal mood and affect. His behavior is normal. Judgment and thought content normal.  Vascular: Femoral pulses is palpable on both sides. Distal pulses are palpable.    EKG: Normal sinus rhythm with nonspecific T wave changes.  ASSESSMENT AND PLAN

## 2012-11-07 NOTE — Assessment & Plan Note (Signed)
He seems to be stable overall with no chest pain.

## 2012-11-07 NOTE — Patient Instructions (Addendum)
Schedule Aortoiliac Duplex in 3 months.  Follow up after that.  Try to quit smoking.  Start walking exercise.  Do not stop Aspirin or Plavix.

## 2012-11-07 NOTE — Assessment & Plan Note (Signed)
Blood pressure is well controlled 

## 2012-12-02 ENCOUNTER — Telehealth: Payer: Self-pay | Admitting: Cardiovascular Disease

## 2012-12-02 NOTE — Telephone Encounter (Signed)
Letter faxed to # provided 

## 2012-12-02 NOTE — Telephone Encounter (Signed)
Dr Skeet Simmer wants pt to have a colonoscopy and Upper GI ASAP. Pt needs cardiac clearance for procedure. Phone [phone] fax [phone]

## 2012-12-02 NOTE — Telephone Encounter (Signed)
He can proceed at an overall low cardiac risk. He should continue aspirin 81 mg daily and Plavix daily without interruption.

## 2012-12-02 NOTE — Telephone Encounter (Signed)
Please see below and advise. thanks 

## 2013-01-18 ENCOUNTER — Other Ambulatory Visit: Payer: Self-pay

## 2013-01-22 ENCOUNTER — Ambulatory Visit: Payer: Self-pay | Admitting: Gastroenterology

## 2013-01-27 ENCOUNTER — Emergency Department: Payer: Self-pay | Admitting: Emergency Medicine

## 2013-01-27 LAB — COMPREHENSIVE METABOLIC PANEL
Albumin: 4 g/dL (ref 3.4–5.0)
Alkaline Phosphatase: 72 U/L (ref 50–136)
Anion Gap: 8 (ref 7–16)
BUN: 6 mg/dL — ABNORMAL LOW (ref 7–18)
Bilirubin,Total: 0.6 mg/dL (ref 0.2–1.0)
Calcium, Total: 9.2 mg/dL (ref 8.5–10.1)
Chloride: 106 mmol/L (ref 98–107)
Co2: 26 mmol/L (ref 21–32)
Creatinine: 1.01 mg/dL (ref 0.60–1.30)
EGFR (African American): >60
EGFR (Non-African Amer.): >60
Glucose: 96 mg/dL (ref 65–99)
Osmolality: 277 (ref 275–301)
Potassium: 4.1 mmol/L (ref 3.5–5.1)
SGOT(AST): 46 U/L — ABNORMAL HIGH (ref 15–37)
SGPT (ALT): 46 U/L (ref 12–78)
Sodium: 140 mmol/L (ref 136–145)
Total Protein: 8.5 g/dL — ABNORMAL HIGH (ref 6.4–8.2)

## 2013-01-27 LAB — URINALYSIS, COMPLETE
Bacteria: NONE SEEN
Bilirubin,UR: NEGATIVE
Blood: NEGATIVE
Glucose,UR: NEGATIVE mg/dL (ref 0–75)
Hyaline Cast: 10
Ketone: NEGATIVE
Nitrite: NEGATIVE
Ph: 5 (ref 4.5–8.0)
Protein: NEGATIVE
RBC,UR: NONE SEEN /HPF (ref 0–5)
Specific Gravity: 1.01 (ref 1.003–1.030)
Squamous Epithelial: <1
WBC UR: 4 /HPF (ref 0–5)

## 2013-01-27 LAB — DRUG SCREEN, URINE
Amphetamines, Ur Screen: POSITIVE (ref ?–1000)
Barbiturates, Ur Screen: NEGATIVE (ref ?–200)
Benzodiazepine, Ur Scrn: NEGATIVE (ref ?–200)
Cannabinoid 50 Ng, Ur ~~LOC~~: POSITIVE (ref ?–50)
Cocaine Metabolite,Ur ~~LOC~~: POSITIVE (ref ?–300)
MDMA (Ecstasy)Ur Screen: POSITIVE (ref ?–500)
Methadone, Ur Screen: NEGATIVE (ref ?–300)
Opiate, Ur Screen: NEGATIVE (ref ?–300)
Phencyclidine (PCP) Ur S: NEGATIVE (ref ?–25)
Tricyclic, Ur Screen: NEGATIVE (ref ?–1000)

## 2013-01-27 LAB — CBC
HCT: 52.8 % — ABNORMAL HIGH (ref 40.0–52.0)
HGB: 17.4 g/dL (ref 13.0–18.0)
MCH: 31.8 pg (ref 26.0–34.0)
MCHC: 33 g/dL (ref 32.0–36.0)
MCV: 97 fL (ref 80–100)
Platelet: 227 10*3/uL (ref 150–440)
RBC: 5.47 10*6/uL (ref 4.40–5.90)
RDW: 14.8 % — ABNORMAL HIGH (ref 11.5–14.5)
WBC: 9 10*3/uL (ref 3.8–10.6)

## 2013-01-27 LAB — TROPONIN I
Troponin-I: <0.02 ng/mL
Troponin-I: 0.02 ng/mL
Troponin-I: 0.03 ng/mL

## 2013-01-27 LAB — TSH: Thyroid Stimulating Horm: 3.22 u[IU]/mL

## 2013-01-27 LAB — VALPROIC ACID LEVEL: Valproic Acid: 35 ug/mL — ABNORMAL LOW

## 2013-01-27 LAB — ETHANOL
Ethanol %: 0.177 % — ABNORMAL HIGH (ref 0.000–0.080)
Ethanol: 177 mg/dL

## 2013-01-27 LAB — LITHIUM LEVEL: Lithium: 0.45 mmol/L — ABNORMAL LOW

## 2013-01-27 LAB — SALICYLATE LEVEL: Salicylates, Serum: <1.7 mg/dL

## 2013-01-27 LAB — ACETAMINOPHEN LEVEL: Acetaminophen: <2 ug/mL

## 2013-01-28 ENCOUNTER — Other Ambulatory Visit: Payer: Self-pay

## 2013-01-28 MED ORDER — AMLODIPINE BESYLATE 5 MG PO TABS: 5.0000 mg | ORAL_TABLET | Freq: Every day | ORAL | Status: DC

## 2013-02-06 ENCOUNTER — Encounter (INDEPENDENT_AMBULATORY_CARE_PROVIDER_SITE_OTHER): Payer: Medicaid Other

## 2013-02-06 DIAGNOSIS — I739 Peripheral vascular disease, unspecified: Secondary | ICD-10-CM

## 2013-02-06 DIAGNOSIS — I70219 Atherosclerosis of native arteries of extremities with intermittent claudication, unspecified extremity: Secondary | ICD-10-CM

## 2013-02-10 ENCOUNTER — Encounter: Payer: Self-pay | Admitting: Cardiovascular Disease

## 2013-02-10 ENCOUNTER — Ambulatory Visit (INDEPENDENT_AMBULATORY_CARE_PROVIDER_SITE_OTHER): Payer: Medicaid Other | Admitting: Cardiovascular Disease

## 2013-02-10 VITALS — BP 140/90 | HR 87 | Ht 67.0 in | Wt 165.0 lb

## 2013-02-10 DIAGNOSIS — I739 Peripheral vascular disease, unspecified: Secondary | ICD-10-CM

## 2013-02-10 DIAGNOSIS — F17200 Nicotine dependence, unspecified, uncomplicated: Secondary | ICD-10-CM

## 2013-02-10 DIAGNOSIS — I10 Essential (primary) hypertension: Secondary | ICD-10-CM

## 2013-02-10 DIAGNOSIS — I2510 Atherosclerotic heart disease of native coronary artery without angina pectoris: Secondary | ICD-10-CM

## 2013-02-10 NOTE — Assessment & Plan Note (Signed)
Stable with no angina. Continue medical therapy.

## 2013-02-10 NOTE — Progress Notes (Signed)
HPI  [name] is a 52 year old gentleman who is here today for a followup visit regarding PAD.  He has a history of coronary artery disease, stent to his RCA in April 2008 with occluded circumflex, bilateral iliac stenting ,  mild to moderate renal artery stenosis on the left, history of hepatitis C, long alcohol history currently in remission, long history of smoking who continues to smoke, hypertension and hyperlipidemia .  He was seen in November of last year  for recurrent left lower extremity claudication. Carotid duplex ultrasound showed  occluded stents in left common iliac and external iliac arteries. That was in the setting of interruption of dual antiplatelet therapy for a biopsy. He underwent angiography which showed thrombotic occlusion of the left common and external iliac artery stents with extensive old organized thrombus. He underwent successful thrombectomy. There was residual stenosis at the left common iliac artery which was treated with a covered stent. There was also significant disease in the distal left external iliac artery which was treated with self-expanding stent. He has been doing well since then. Has chronic left buttock and thigh discomfort which could be related to a chronically occluded internal iliac artery. Otherwise he has been doing well.  No Known Allergies   Current Outpatient Prescriptions on File Prior to Visit  Medication Sig Dispense Refill  . amLODipine (NORVASC) 5 MG tablet Take 1 tablet (5 mg total) by mouth daily.  30 tablet  5  . aspirin 81 MG EC tablet Take 81 mg by mouth daily.        Marland Kitchen atorvastatin (LIPITOR) 10 MG tablet Take 1 tablet (10 mg total) by mouth daily.  90 tablet  3  . buPROPion (WELLBUTRIN XL) 300 MG 24 hr tablet Take 300 mg by mouth daily.      . clopidogrel (PLAVIX) 75 MG tablet Take 1 tablet (75 mg total) by mouth daily.  30 tablet  11  . divalproex (DEPAKOTE) 500 MG EC tablet Take 500 mg by mouth 3 (three) times daily.        Marland Kitchen lisdexamfetamine (VYVANSE) 70 MG capsule Take 70 mg by mouth every morning.      . lithium 300 MG tablet Take 300 mg by mouth 2 (two) times daily.        . pantoprazole (PROTONIX) 40 MG tablet Take 1 tablet (40 mg total) by mouth daily.  90 tablet  3  . Vilazodone HCl (VIIBRYD) 40 MG TABS Take 40 mg by mouth daily.       No current facility-administered medications on file prior to visit.     Past Medical History  Diagnosis Date  . Hypertension   . Hyperlipidemia   . Coronary artery disease     s/p PCI to ?artery at [facility]; s/p PCI to RCA '08; Cath 05/2012 showed known occluded LCx, RCA stent w/ mild restenosis, LAD no significant dz, EF 35%   . Myocardial infarction 1996; 1997; 2000's    "total of 3" (10/22/2012)  . Hepatitis C     "diagnosed in the past 2 wk" (10/22/2012)  . Seizures     "when I get anxious" (10/22/2012)  . Anxiety   . Depression   . Bipolar affective   . Schizophrenia   . PAD (peripheral artery disease)     2010 PTA & stent to left CIA & left SFA; 05/2012 PTA & stent to distal LCIA; 10/2012 thrombectomy and stents to LCIA and LEIA  . Renal artery stenosis  mild to moderate renal artery stenosis on the left  . History of ETOH abuse     quit 2007  . Tobacco abuse      Past Surgical History  Procedure Laterality Date  . Subdural hematoma evacuation via craniotomy    . Anterior cervical decomp/discectomy fusion  01/2011    plate & screw placement   . Iliac artery stent  10/22/2012    "2" (10/22/2012)  . Brain surgery    . Posterior fusion cervical spine  2012  . Liver biopsy  10/2012  . Cardiac catheterization      Stent - 12  . Coronary angioplasty with stent placement  03/2007    to RCA  . Iliac artery stent  06/2012    left     Family History  Problem Relation Age of Onset  . Emphysema Father   . Diabetes Other   . Alcohol abuse Other   . Hypertension Other   . Hyperlipidemia Other   . Seizures Other      History   Social  History  . Marital Status: Divorced    Spouse Name: N/A    Number of Children: N/A  . Years of Education: N/A   Occupational History  . Disabled    Social History Main Topics  . Smoking status: Current Every Day Smoker -- 1.00 packs/day for 40 years    Types: Cigarettes  . Smokeless tobacco: Never Used  . Alcohol Use: No     Comment: 10/22/2012 "last drink was 5-7 yr ago; used to have a problem w/it"  . Drug Use: No  . Sexually Active: Yes   Other Topics Concern  . Not on file   Social History Narrative   17 years Bipolar/ADHD.   Pt gets regular exercise.     PHYSICAL EXAM   BP 140/90  Pulse 87  Ht 5\' 7"  (1.702 m)  Wt 165 lb (74.844 kg)  BMI 25.84 kg/m2  Constitutional: He is oriented to person, place, and time. He appears well-developed and well-nourished. No distress.  HENT: No nasal discharge.  Head: Normocephalic and atraumatic.  Eyes: Pupils are equal and round. Right eye exhibits no discharge. Left eye exhibits no discharge.  Neck: Normal range of motion. Neck supple. No JVD present. No thyromegaly present.  Cardiovascular: Normal rate, regular rhythm, normal heart sounds and. Exam reveals no gallop and no friction rub. No murmur heard.  Pulmonary/Chest: Effort normal and breath sounds normal. No stridor. No respiratory distress. He has no wheezes. He has no rales. He exhibits no tenderness.  Abdominal: Soft. Bowel sounds are normal. He exhibits no distension. There is no tenderness. There is no rebound and no guarding.  Musculoskeletal: Normal range of motion. He exhibits no edema and no tenderness.  Neurological: He is alert and oriented to person, place, and time. Coordination normal.  Skin: Skin is warm and dry. No rash noted. He is not diaphoretic. No erythema. No pallor.  Psychiatric: He has a normal mood and affect. His behavior is normal. Judgment and thought content normal.  Vascular: Femoral pulses is palpable on both sides. Distal pulses are palpable.      EKG: Normal sinus rhythm with nonspecific T wave changes.  ASSESSMENT AND PLAN

## 2013-02-10 NOTE — Patient Instructions (Addendum)
Continue same medications.  Schedule ABI and aortoiliac Duplex in 6 months then follow up 1 week after that.

## 2013-02-10 NOTE — Assessment & Plan Note (Signed)
He is overall stable with no worsening claudication. Recent ABI was normal bilaterally. Aortoiliac duplex showed patent stents in the bilateral common and external iliac arteries without significant restenosis. Continue long-term dual antiplatelet therapy. Followup ABI in aortoiliac duplex in 6 months from now.

## 2013-02-10 NOTE — Assessment & Plan Note (Signed)
Unfortunately, he continues to smoke. I discussed with him again the importance of smoking cessation especially with his known history of extensive PAD and CAD.

## 2013-02-19 ENCOUNTER — Ambulatory Visit: Payer: Self-pay | Admitting: Gastroenterology

## 2013-04-10 ENCOUNTER — Ambulatory Visit: Payer: Self-pay | Admitting: Internal Medicine

## 2013-04-30 ENCOUNTER — Ambulatory Visit: Payer: Self-pay | Admitting: Family Medicine

## 2013-05-14 ENCOUNTER — Ambulatory Visit (INDEPENDENT_AMBULATORY_CARE_PROVIDER_SITE_OTHER): Payer: Medicaid Other

## 2013-05-14 ENCOUNTER — Telehealth: Payer: Self-pay

## 2013-05-14 ENCOUNTER — Encounter (INDEPENDENT_AMBULATORY_CARE_PROVIDER_SITE_OTHER): Payer: Medicaid Other

## 2013-05-14 VITALS — BP 120/88 | HR 81 | Ht 67.0 in | Wt 160.0 lb

## 2013-05-14 DIAGNOSIS — R0989 Other specified symptoms and signs involving the circulatory and respiratory systems: Secondary | ICD-10-CM

## 2013-05-14 DIAGNOSIS — I6529 Occlusion and stenosis of unspecified carotid artery: Secondary | ICD-10-CM

## 2013-05-14 DIAGNOSIS — R079 Chest pain, unspecified: Secondary | ICD-10-CM

## 2013-05-14 NOTE — Telephone Encounter (Signed)
Added to schedule 6/13 Will await call back to confirm

## 2013-05-14 NOTE — Progress Notes (Signed)
Pt here for carotid u/s Was c/o intermittent CP and easy bruising on abdomen  I brought him back to clinic C/o chronic intermittent CP x 3 months, associated with SOB and dizziness Also c/o "random bruises" on his abdomen with no recollection of bumping abdomen into anything Denies blood in stools or urine. Also denies coughing up blood. Is on Plavix C/o pain in LE with walking and is relieved with rest Continues to smoke  Is in no acute distress. Denies cp or sob at the present time  EKG obtained.appears unchanged from last EKG I will reassure pt and will let Dr. Kirke Corin know of his complaints.  We will call him with his advice.

## 2013-05-14 NOTE — Telephone Encounter (Signed)
Message copied by [facility], Terrian Ridlon E on Wed May 14, 2013  4:55 PM ------      Message from: Lorine Bears A      Created: Wed May 14, 2013  4:38 PM       Add him to my schedule on Friday.             ----- Message -----         From: Marcelle Overlie, RN         Sent: 05/14/2013   4:23 PM           To: Iran Ouch, MD                   ------

## 2013-05-14 NOTE — Patient Instructions (Addendum)
I will discuss with Dr. Kirke Corin and call you back.   Your EKG appears unchanged from the last one. If you begin to have symptoms of chest pain or shortness of breath again and is not resolved, please call 911/go to ER

## 2013-05-14 NOTE — Telephone Encounter (Signed)
See below

## 2013-05-14 NOTE — Telephone Encounter (Signed)
LMTCB

## 2013-05-15 NOTE — Telephone Encounter (Signed)
lmtcb

## 2013-05-15 NOTE — Telephone Encounter (Signed)
Pt called back to confirm appt with [place] tomm

## 2013-05-16 ENCOUNTER — Ambulatory Visit (INDEPENDENT_AMBULATORY_CARE_PROVIDER_SITE_OTHER): Payer: Medicaid Other | Admitting: Cardiovascular Disease

## 2013-05-16 ENCOUNTER — Encounter: Payer: Self-pay | Admitting: Cardiovascular Disease

## 2013-05-16 VITALS — BP 160/92 | HR 76 | Ht 67.0 in | Wt 162.5 lb

## 2013-05-16 DIAGNOSIS — I2510 Atherosclerotic heart disease of native coronary artery without angina pectoris: Secondary | ICD-10-CM

## 2013-05-16 DIAGNOSIS — R079 Chest pain, unspecified: Secondary | ICD-10-CM

## 2013-05-16 DIAGNOSIS — I739 Peripheral vascular disease, unspecified: Secondary | ICD-10-CM

## 2013-05-16 NOTE — Patient Instructions (Addendum)
Your physician has requested that you have a lexiscan myoview. For further information please visit [email or web address]. Please follow instruction sheet, as given.   

## 2013-05-16 NOTE — Progress Notes (Signed)
HPI  [name] is a 52 year old gentleman who is here today for a followup visit regarding PAD and chest pain.  He was added on urgently to the schedule. He has a history of coronary artery disease, stent to his RCA in April 2008 with occluded circumflex, bilateral iliac stenting ,  mild to moderate renal artery stenosis on the left, history of hepatitis C, long alcohol history currently in remission, long history of smoking who continues to smoke, hypertension, bipolar disorder and hyperlipidemia .  He was seen in November of last year  for recurrent left lower extremity claudication. Duplex ultrasound showed  occluded stents in left common iliac and external iliac arteries. That was in the setting of interruption of dual antiplatelet therapy for a biopsy. He underwent angiography which showed thrombotic occlusion of the left common and external iliac artery stents with extensive old organized thrombus. He underwent successful thrombectomy. There was residual stenosis at the left common iliac artery which was treated with a covered stent. There was also significant disease in the distal left external iliac artery which was treated with self-expanding stent.  Has chronic left buttock and thigh discomfort which could be related to a chronically occluded internal iliac artery.  Over the last few weeks, he has noticed increased episodes of substernal chest tightness mostly at rest while watching TV. This does not necessarily worsen with physical activities but he does have dyspnea with minimal activities. He does not like using sublingual nitroglycerin because it gives him a headache.  No Known Allergies   Current Outpatient Prescriptions on File Prior to Visit  Medication Sig Dispense Refill  . amLODipine (NORVASC) 5 MG tablet Take 1 tablet (5 mg total) by mouth daily.  30 tablet  5  . aspirin 81 MG EC tablet Take 81 mg by mouth daily.        Marland Kitchen atorvastatin (LIPITOR) 10 MG tablet Take 1 tablet  (10 mg total) by mouth daily.  90 tablet  3  . buPROPion (WELLBUTRIN XL) 300 MG 24 hr tablet Take 300 mg by mouth daily.      . clonazePAM (KLONOPIN) 2 MG tablet Take 2 mg by mouth 3 (three) times daily.      . clopidogrel (PLAVIX) 75 MG tablet Take 1 tablet (75 mg total) by mouth daily.  30 tablet  11  . divalproex (DEPAKOTE) 500 MG EC tablet Take 500 mg by mouth 3 (three) times daily.       Marland Kitchen lisdexamfetamine (VYVANSE) 70 MG capsule Take 70 mg by mouth every morning.      . lithium 300 MG tablet Take 300 mg by mouth 2 (two) times daily.        . pantoprazole (PROTONIX) 40 MG tablet Take 1 tablet (40 mg total) by mouth daily.  90 tablet  3  . Vilazodone HCl (VIIBRYD) 40 MG TABS Take 40 mg by mouth daily.       No current facility-administered medications on file prior to visit.     Past Medical History  Diagnosis Date  . Hypertension   . Hyperlipidemia   . Coronary artery disease     s/p PCI to ?artery at [facility]; s/p PCI to RCA '08; Cath 05/2012 showed known occluded LCx, RCA stent w/ mild restenosis, LAD no significant dz, EF 35%   . Myocardial infarction 1996; 1997; 2000's    "total of 3" (10/22/2012)  . Hepatitis C     "diagnosed in the past 2 wk" (10/22/2012)  .  Seizures     "when I get anxious" (10/22/2012)  . Anxiety   . Depression   . Bipolar affective   . Schizophrenia   . PAD (peripheral artery disease)     2010 PTA & stent to left CIA & left SFA; 05/2012 PTA & stent to distal LCIA; 10/2012 thrombectomy and stents to LCIA and LEIA  . Renal artery stenosis     mild to moderate renal artery stenosis on the left  . History of ETOH abuse     quit 2007  . Tobacco abuse   . Broken fingers      Past Surgical History  Procedure Laterality Date  . Subdural hematoma evacuation via craniotomy    . Anterior cervical decomp/discectomy fusion  01/2011    plate & screw placement   . Iliac artery stent  10/22/2012    "2" (10/22/2012)  . Brain surgery    . Posterior fusion  cervical spine  2012  . Liver biopsy  10/2012  . Cardiac catheterization      Stent - 12  . Coronary angioplasty with stent placement  03/2007    to RCA  . Iliac artery stent  06/2012    left     Family History  Problem Relation Age of Onset  . Emphysema Father   . Diabetes Other   . Alcohol abuse Other   . Hypertension Other   . Hyperlipidemia Other   . Seizures Other      History   Social History  . Marital Status: Divorced    Spouse Name: N/A    Number of Children: N/A  . Years of Education: N/A   Occupational History  . Disabled    Social History Main Topics  . Smoking status: Current Every Day Smoker -- 1.00 packs/day for 40 years    Types: Cigarettes  . Smokeless tobacco: Never Used  . Alcohol Use: No     Comment: 10/22/2012 "last drink was 5-7 yr ago; used to have a problem w/it"  . Drug Use: No  . Sexually Active: Yes   Other Topics Concern  . Not on file   Social History Narrative   17 years Bipolar/ADHD.   Pt gets regular exercise.     PHYSICAL EXAM   BP 160/92  Pulse 76  Ht 5\' 7"  (1.702 m)  Wt 162 lb 8 oz (73.71 kg)  BMI 25.45 kg/m2  Constitutional: He is oriented to person, place, and time. He appears well-developed and well-nourished. No distress.  HENT: No nasal discharge.  Head: Normocephalic and atraumatic.  Eyes: Pupils are equal and round. Right eye exhibits no discharge. Left eye exhibits no discharge.  Neck: Normal range of motion. Neck supple. No JVD present. No thyromegaly present.  Cardiovascular: Normal rate, regular rhythm, normal heart sounds and. Exam reveals no gallop and no friction rub. No murmur heard.  Pulmonary/Chest: Effort normal and breath sounds normal. No stridor. No respiratory distress. He has no wheezes. He has no rales. He exhibits no tenderness.  Abdominal: Soft. Bowel sounds are normal. He exhibits no distension. There is no tenderness. There is no rebound and no guarding.  Musculoskeletal: Normal range of  motion. He exhibits no edema and no tenderness.  Neurological: He is alert and oriented to person, place, and time. Coordination normal.  Skin: Skin is warm and dry. No rash noted. He is not diaphoretic. No erythema. No pallor.  Psychiatric: He has a normal mood and affect. His behavior is normal. Judgment and  thought content normal.  Vascular: Femoral pulses is palpable on both sides. Distal pulses are palpable.    EKG: Sinus  Rhythm  -Old inferolateral infarct.   -  Nonspecific T-abnormality.   ABNORMAL   ASSESSMENT AND PLAN

## 2013-05-16 NOTE — Assessment & Plan Note (Signed)
His femoral pulses are palpable bilaterally. Continue dual antiplatelet therapy. Unfortunately, he is still not able to quit smoking.

## 2013-05-16 NOTE — Assessment & Plan Note (Addendum)
His current symptoms of chest pain seem to be happening was that chest with some atypical features. However, he reports that he feels the same way as he felt before he had the stent to the RCA. I recommend evaluation with a pharmacologic nuclear stress test. The patient is not able to exercise on a treadmill due to peripheral arterial disease and significant dyspnea related to COPD. If his stress test does not show significant areas of ischemia, I will plan on adding long-acting nitroglycerin.

## 2013-05-22 ENCOUNTER — Other Ambulatory Visit: Payer: Self-pay

## 2013-05-22 ENCOUNTER — Ambulatory Visit: Payer: Self-pay | Admitting: Cardiovascular Disease

## 2013-05-22 DIAGNOSIS — R079 Chest pain, unspecified: Secondary | ICD-10-CM

## 2013-05-23 ENCOUNTER — Telehealth: Payer: Self-pay | Admitting: *Deleted

## 2013-05-23 MED ORDER — ISOSORBIDE MONONITRATE ER 30 MG PO TB24: 30.0000 mg | ORAL_TABLET | Freq: Every day | ORAL | Status: DC

## 2013-05-23 MED ORDER — METOPROLOL SUCCINATE ER 25 MG PO TB24: 25.0000 mg | ORAL_TABLET | Freq: Every day | ORAL | Status: DC

## 2013-05-23 NOTE — Telephone Encounter (Signed)
Message copied by Antony Odea on Fri May 23, 2013 10:15 AM ------      Message from: Lorine Bears A      Created: Thu May 22, 2013  5:16 PM       Inform patient that  stress test was not suggestive of new blockages. I recommend adding Imdur 30 mg once daily and Toprol 25 mg once daily. Have him follow up in 2 weeks. If symptoms are not better,  A cath might be needed. ------

## 2013-05-23 NOTE — Telephone Encounter (Signed)
New meds reviewed/ results reviewed. Pt agreed to plan and was given a 2 week app. Pt told to call with further questions or concerns.

## 2013-05-26 ENCOUNTER — Other Ambulatory Visit: Payer: Self-pay | Admitting: *Deleted

## 2013-05-26 MED ORDER — TOPROL XL 25 MG PO TB24: 25.0000 mg | ORAL_TABLET | Freq: Every day | ORAL | Status: DC

## 2013-05-26 NOTE — Telephone Encounter (Signed)
Spoke with Vickie from [facility] medicaid due to pharmacy needing prior authorization for metoprolol succinate. Vickie mentioned that the pharmacy needs to bill for Toprol XL (Brand Name) in order for medication to be covered.  Sent in Refill for Toprol XL 25 mg to pharmacy.

## 2013-06-09 ENCOUNTER — Encounter: Payer: Self-pay | Admitting: Cardiovascular Disease

## 2013-06-09 ENCOUNTER — Ambulatory Visit: Payer: Self-pay | Admitting: Cardiovascular Disease

## 2013-06-09 ENCOUNTER — Ambulatory Visit (INDEPENDENT_AMBULATORY_CARE_PROVIDER_SITE_OTHER): Payer: Medicaid Other | Admitting: Cardiovascular Disease

## 2013-06-09 VITALS — BP 147/102 | HR 82 | Ht 67.0 in | Wt 161.0 lb

## 2013-06-09 DIAGNOSIS — I2589 Other forms of chronic ischemic heart disease: Secondary | ICD-10-CM

## 2013-06-09 DIAGNOSIS — R079 Chest pain, unspecified: Secondary | ICD-10-CM

## 2013-06-09 DIAGNOSIS — I255 Ischemic cardiomyopathy: Secondary | ICD-10-CM

## 2013-06-09 DIAGNOSIS — F17200 Nicotine dependence, unspecified, uncomplicated: Secondary | ICD-10-CM

## 2013-06-09 DIAGNOSIS — I739 Peripheral vascular disease, unspecified: Secondary | ICD-10-CM

## 2013-06-09 DIAGNOSIS — R0602 Shortness of breath: Secondary | ICD-10-CM

## 2013-06-09 DIAGNOSIS — I2510 Atherosclerotic heart disease of native coronary artery without angina pectoris: Secondary | ICD-10-CM

## 2013-06-09 MED ORDER — METOPROLOL SUCCINATE ER 25 MG PO TB24: 25.0000 mg | ORAL_TABLET | Freq: Every day | ORAL | Status: DC

## 2013-06-09 NOTE — Progress Notes (Signed)
HPI  [name] is a 52 year old gentleman who is here today for a followup visit regarding PAD and chest pain. He has a history of coronary artery disease, stent to his RCA in April 2008 with occluded circumflex, bilateral iliac stenting ,  mild to moderate renal artery stenosis on the left, history of hepatitis C, long alcohol history currently in remission, long history of smoking who continues to smoke, hypertension, bipolar disorder and hyperlipidemia .  He has chronic left buttock and thigh discomfort which could be related to a chronically occluded internal iliac artery.  He was seen recently for worsening exertional dyspnea and chest pain. He underwent a pharmacologic nuclear stress test which showed evidence of large infarct involving the left circumflex distribution without significant ischemia. Ejection fraction visually was 40% (calculated was 31%). I added Imdur and Toprol. However, he reports having headache and stop taking both medications. He is still feeling the same. He feels very limited with current symptoms.  No Known Allergies   Current Outpatient Prescriptions on File Prior to Visit  Medication Sig Dispense Refill  . amLODipine (NORVASC) 5 MG tablet Take 1 tablet (5 mg total) by mouth daily.  30 tablet  5  . aspirin 81 MG EC tablet Take 81 mg by mouth daily.        Marland Kitchen atorvastatin (LIPITOR) 10 MG tablet Take 1 tablet (10 mg total) by mouth daily.  90 tablet  3  . buPROPion (WELLBUTRIN XL) 300 MG 24 hr tablet Take 300 mg by mouth daily.      . clonazePAM (KLONOPIN) 2 MG tablet Take 2 mg by mouth 3 (three) times daily.      . clopidogrel (PLAVIX) 75 MG tablet Take 1 tablet (75 mg total) by mouth daily.  30 tablet  11  . divalproex (DEPAKOTE) 500 MG EC tablet Take 500 mg by mouth 3 (three) times daily.       Marland Kitchen lisdexamfetamine (VYVANSE) 70 MG capsule Take 70 mg by mouth every morning.      Marland Kitchen lisinopril (PRINIVIL,ZESTRIL) 2.5 MG tablet Take 2.5 mg by mouth daily.      Marland Kitchen  lithium 300 MG tablet Take 300 mg by mouth 2 (two) times daily.        . pantoprazole (PROTONIX) 40 MG tablet Take 1 tablet (40 mg total) by mouth daily.  90 tablet  3  . Vilazodone HCl (VIIBRYD) 40 MG TABS Take 40 mg by mouth daily.       No current facility-administered medications on file prior to visit.     Past Medical History  Diagnosis Date  . Hypertension   . Hyperlipidemia   . Coronary artery disease     s/p PCI to ?artery at [facility]; s/p PCI to RCA '08; Cath 05/2012 showed known occluded LCx, RCA stent w/ mild restenosis, LAD no significant dz, EF 35%   . Myocardial infarction 1996; 1997; 2000's    "total of 3" (10/22/2012)  . Hepatitis C     "diagnosed in the past 2 wk" (10/22/2012)  . Seizures     "when I get anxious" (10/22/2012)  . Anxiety   . Depression   . Bipolar affective   . Schizophrenia   . PAD (peripheral artery disease)     2010 PTA & stent to left CIA & left SFA; 05/2012 PTA & stent to distal LCIA; 10/2012 thrombectomy and stents to LCIA and LEIA  . Renal artery stenosis     mild to moderate  renal artery stenosis on the left  . History of ETOH abuse     quit 2007  . Tobacco abuse   . Broken fingers      Past Surgical History  Procedure Laterality Date  . Subdural hematoma evacuation via craniotomy    . Anterior cervical decomp/discectomy fusion  01/2011    plate & screw placement   . Iliac artery stent  10/22/2012    "2" (10/22/2012)  . Brain surgery    . Posterior fusion cervical spine  2012  . Liver biopsy  10/2012  . Cardiac catheterization      Stent - 12  . Coronary angioplasty with stent placement  03/2007    to RCA  . Iliac artery stent  06/2012    left     Family History  Problem Relation Age of Onset  . Emphysema Father   . Diabetes Other   . Alcohol abuse Other   . Hypertension Other   . Hyperlipidemia Other   . Seizures Other      History   Social History  . Marital Status: Divorced    Spouse Name: N/A    Number of  Children: N/A  . Years of Education: N/A   Occupational History  . Disabled    Social History Main Topics  . Smoking status: Current Every Day Smoker -- 1.00 packs/day for 40 years    Types: Cigarettes  . Smokeless tobacco: Never Used  . Alcohol Use: No     Comment: 10/22/2012 "last drink was 5-7 yr ago; used to have a problem w/it"  . Drug Use: No  . Sexually Active: Yes   Other Topics Concern  . Not on file   Social History Narrative   17 years Bipolar/ADHD.   Pt gets regular exercise.     PHYSICAL EXAM   BP 147/102  Pulse 82  Ht 5\' 7"  (1.702 m)  Wt 161 lb (73.029 kg)  BMI 25.21 kg/m2  Constitutional: He is oriented to person, place, and time. He appears well-developed and well-nourished. No distress.  HENT: No nasal discharge.  Head: Normocephalic and atraumatic.  Eyes: Pupils are equal and round. Right eye exhibits no discharge. Left eye exhibits no discharge.  Neck: Normal range of motion. Neck supple. No JVD present. No thyromegaly present.  Cardiovascular: Normal rate, regular rhythm, normal heart sounds and. Exam reveals no gallop and no friction rub. No murmur heard.  Pulmonary/Chest: Effort normal and breath sounds normal. No stridor. No respiratory distress. He has no wheezes. He has no rales. He exhibits no tenderness.  Abdominal: Soft. Bowel sounds are normal. He exhibits no distension. There is no tenderness. There is no rebound and no guarding.  Musculoskeletal: Normal range of motion. He exhibits no edema and no tenderness.  Neurological: He is alert and oriented to person, place, and time. Coordination normal.  Skin: Skin is warm and dry. No rash noted. He is not diaphoretic. No erythema. No pallor.  Psychiatric: He has a normal mood and affect. His behavior is normal. Judgment and thought content normal.  Vascular: Femoral pulses is palpable on both sides. Distal pulses are palpable.    EKG: Sinus  Rhythm  -  T-abnormality  -Possible  Anterolateral  and inferior  ischemia  -consider inferior apical infarct (age undetermined).   ABNORMAL    ASSESSMENT AND PLAN

## 2013-06-09 NOTE — Assessment & Plan Note (Signed)
He is not able to quit in spite of multiple recommendations.

## 2013-06-09 NOTE — Assessment & Plan Note (Signed)
I will start small dose Toprol today and gradually up titrate as tolerated. If he is not able to tolerate this, I will consider carvedilol. He is early on a small dose ACE inhibitor.

## 2013-06-09 NOTE — Assessment & Plan Note (Signed)
He continues to have significant exertional dyspnea and chest pain in spite of medical therapy. Unfortunately, he did not tolerate long-acting nitroglycerin due to severe headache. He is already on amlodipine. I will try to introduce a small dose metoprolol extended release 25 mg once daily. His stress test showed evidence of prior large infarct without significant ischemia. However, given that his symptoms are worsening, I recommend proceeding with cardiac catheterization for a definitive diagnosis. Risks, benefits and alternatives were discussed with him.

## 2013-06-09 NOTE — Patient Instructions (Addendum)
Start Metoprolol ER 25 mg once daily.   Your physician has requested that you have a cardiac catheterization. Cardiac catheterization is used to diagnose and/or treat various heart conditions. Doctors may recommend this procedure for a number of different reasons. The most common reason is to evaluate chest pain. Chest pain can be a symptom of coronary artery disease (CAD), and cardiac catheterization can show whether plaque is narrowing or blocking your heart's arteries. This procedure is also used to evaluate the valves, as well as measure the blood flow and oxygen levels in different parts of your heart. For further information please visit [email or web address] Please follow instruction sheet, as given.

## 2013-06-09 NOTE — Assessment & Plan Note (Signed)
Status post extensive bilateral iliac stenting. Stable claudication due to occluded internal iliac arteries with normal ABI. Continue indefinite treatment with dual antiplatelet therapy.

## 2013-06-10 ENCOUNTER — Other Ambulatory Visit: Payer: Self-pay

## 2013-06-10 DIAGNOSIS — R0602 Shortness of breath: Secondary | ICD-10-CM

## 2013-06-10 DIAGNOSIS — R079 Chest pain, unspecified: Secondary | ICD-10-CM

## 2013-06-10 LAB — CBC WITH DIFFERENTIAL
Basophils Absolute: 0 10*3/uL (ref 0.0–0.2)
Basos: 0 % (ref 0–3)
Eos: 2 % (ref 0–5)
Eosinophils Absolute: 0.2 10*3/uL (ref 0.0–0.4)
HCT: 45.8 % (ref 37.5–51.0)
Hemoglobin: 16.3 g/dL (ref 12.6–17.7)
Immature Grans (Abs): 0 10*3/uL (ref 0.0–0.1)
Immature Granulocytes: 0 % (ref 0–2)
Lymphocytes Absolute: 2.1 10*3/uL (ref 0.7–3.1)
Lymphs: 28 % (ref 14–46)
MCH: 34.6 pg — ABNORMAL HIGH (ref 26.6–33.0)
MCHC: 35.6 g/dL (ref 31.5–35.7)
MCV: 97 fL (ref 79–97)
Monocytes Absolute: 0.7 10*3/uL (ref 0.1–0.9)
Monocytes: 9 % (ref 4–12)
Neutrophils Absolute: 4.6 10*3/uL (ref 1.4–7.0)
Neutrophils Relative %: 61 % (ref 40–74)
Platelets: 164 10*3/uL (ref 150–379)
RBC: 4.71 x10E6/uL (ref 4.14–5.80)
RDW: 13.9 % (ref 12.3–15.4)
WBC: 7.5 10*3/uL (ref 3.4–10.8)

## 2013-06-10 LAB — BASIC METABOLIC PANEL
BUN/Creatinine Ratio: 11 (ref 9–20)
BUN: 13 mg/dL (ref 6–24)
CO2: 26 mmol/L (ref 18–29)
Calcium: 9.5 mg/dL (ref 8.7–10.2)
Chloride: 102 mmol/L (ref 97–108)
Creatinine, Ser: 1.15 mg/dL (ref 0.76–1.27)
GFR calc Af Amer: 84 mL/min/{1.73_m2} (ref >59)
GFR calc non Af Amer: 72 mL/min/{1.73_m2} (ref >59)
Glucose: 80 mg/dL (ref 65–99)
Potassium: 4.9 mmol/L (ref 3.5–5.2)
Sodium: 138 mmol/L (ref 134–144)

## 2013-06-10 LAB — PROTIME-INR
INR: 1 (ref 0.8–1.2)
Prothrombin Time: 10.6 s (ref 9.1–12.0)

## 2013-06-12 ENCOUNTER — Ambulatory Visit: Payer: Self-pay | Admitting: Cardiovascular Disease

## 2013-06-12 DIAGNOSIS — I70219 Atherosclerosis of native arteries of extremities with intermittent claudication, unspecified extremity: Secondary | ICD-10-CM

## 2013-06-12 DIAGNOSIS — I2510 Atherosclerotic heart disease of native coronary artery without angina pectoris: Secondary | ICD-10-CM

## 2013-06-13 ENCOUNTER — Encounter: Payer: Self-pay | Admitting: Cardiovascular Disease

## 2013-07-22 ENCOUNTER — Other Ambulatory Visit: Payer: Self-pay | Admitting: Cardiovascular Disease

## 2013-08-19 ENCOUNTER — Encounter (INDEPENDENT_AMBULATORY_CARE_PROVIDER_SITE_OTHER): Payer: Medicaid Other

## 2013-08-19 DIAGNOSIS — I700 Atherosclerosis of aorta: Secondary | ICD-10-CM

## 2013-08-19 DIAGNOSIS — I739 Peripheral vascular disease, unspecified: Secondary | ICD-10-CM

## 2013-08-20 ENCOUNTER — Telehealth: Payer: Self-pay

## 2013-08-20 NOTE — Telephone Encounter (Signed)
Message copied by Marilynne Halsted on Wed Aug 20, 2013  4:52 PM ------      Message from: Lorine Bears A      Created: Wed Aug 20, 2013  4:43 PM       Inform patient that illiac stents look good. ------

## 2013-08-20 NOTE — Telephone Encounter (Signed)
Left detailed message on pt's voicemail with results and instructions to call us with any questions or concerns.

## 2013-09-12 ENCOUNTER — Encounter: Payer: Self-pay | Admitting: Cardiovascular Disease

## 2013-09-12 ENCOUNTER — Ambulatory Visit (INDEPENDENT_AMBULATORY_CARE_PROVIDER_SITE_OTHER): Payer: Medicaid Other | Admitting: Cardiovascular Disease

## 2013-09-12 VITALS — BP 122/81 | HR 54 | Ht 67.0 in | Wt 163.0 lb

## 2013-09-12 DIAGNOSIS — I10 Essential (primary) hypertension: Secondary | ICD-10-CM

## 2013-09-12 DIAGNOSIS — I2510 Atherosclerotic heart disease of native coronary artery without angina pectoris: Secondary | ICD-10-CM

## 2013-09-12 DIAGNOSIS — I255 Ischemic cardiomyopathy: Secondary | ICD-10-CM

## 2013-09-12 DIAGNOSIS — E785 Hyperlipidemia, unspecified: Secondary | ICD-10-CM

## 2013-09-12 DIAGNOSIS — I739 Peripheral vascular disease, unspecified: Secondary | ICD-10-CM

## 2013-09-12 DIAGNOSIS — I2589 Other forms of chronic ischemic heart disease: Secondary | ICD-10-CM

## 2013-09-12 DIAGNOSIS — B182 Chronic viral hepatitis C: Secondary | ICD-10-CM

## 2013-09-12 DIAGNOSIS — R0989 Other specified symptoms and signs involving the circulatory and respiratory systems: Secondary | ICD-10-CM

## 2013-09-12 NOTE — Assessment & Plan Note (Signed)
He is doing well with no recurrent chest pain. Cardiac catheterization in July showed patent RCA stent with chronically occluded left circumflex. Ejection fraction was 40%. Continue medical therapy.

## 2013-09-12 NOTE — Assessment & Plan Note (Signed)
Continue treatment with atorvastatin. I will check fasting lipid and liver profile today.

## 2013-09-12 NOTE — Progress Notes (Signed)
HPI  [name] is a 52 year old gentleman who is here today for a followup visit regarding PAD and chest pain. He has a history of coronary artery disease, stent to his RCA in April 2008 with occluded circumflex, bilateral iliac stenting ,  mild to moderate renal artery stenosis on the left, history of hepatitis C, long alcohol history currently in remission, long history of smoking who continues to smoke, hypertension, bipolar disorder and hyperlipidemia .  He has chronic left buttock and thigh discomfort which could be related to a chronically occluded internal iliac artery.  He was seen recently for worsening exertional dyspnea and chest pain. He underwent a pharmacologic nuclear stress test which showed evidence of large infarct involving the left circumflex distribution without significant ischemia. Ejection fraction visually was 40% (calculated was 31%). I added Imdur and Toprol but did not tolerate these 2 medications due to headache . He continued to have chest pain and I proceeded with cardiac catheterization which showed no significant change in anatomy. He has been doing well since then with stable exertional dyspnea.   No Known Allergies   Current Outpatient Prescriptions on File Prior to Visit  Medication Sig Dispense Refill  . amLODipine (NORVASC) 5 MG tablet TAKE 1 TABLET BY MOUTH EVERY DAY  30 tablet  5  . aspirin 81 MG EC tablet Take 81 mg by mouth daily.        Marland Kitchen atorvastatin (LIPITOR) 10 MG tablet Take 1 tablet (10 mg total) by mouth daily.  90 tablet  3  . buPROPion (WELLBUTRIN XL) 300 MG 24 hr tablet Take 300 mg by mouth daily.      . clonazePAM (KLONOPIN) 2 MG tablet Take 2 mg by mouth 3 (three) times daily.      . clopidogrel (PLAVIX) 75 MG tablet Take 1 tablet (75 mg total) by mouth daily.  30 tablet  11  . divalproex (DEPAKOTE) 500 MG EC tablet Take 500 mg by mouth 3 (three) times daily.       Marland Kitchen lisdexamfetamine (VYVANSE) 70 MG capsule Take 70 mg by mouth every  morning.      Marland Kitchen lisinopril (PRINIVIL,ZESTRIL) 2.5 MG tablet Take 2.5 mg by mouth daily.      Marland Kitchen lithium 300 MG tablet Take 300 mg by mouth 2 (two) times daily.        . metoprolol succinate (TOPROL XL) 25 MG 24 hr tablet Take 1 tablet (25 mg total) by mouth daily.  30 tablet  3  . pantoprazole (PROTONIX) 40 MG tablet Take 1 tablet (40 mg total) by mouth daily.  90 tablet  3  . Vilazodone HCl (VIIBRYD) 40 MG TABS Take 40 mg by mouth daily.       No current facility-administered medications on file prior to visit.     Past Medical History  Diagnosis Date  . Hypertension   . Hyperlipidemia   . Coronary artery disease     s/p PCI to ?artery at [facility]; s/p PCI to RCA '08; Cath 05/2012 showed known occluded LCx, RCA stent w/ mild restenosis, LAD no significant dz, EF 35%   . Myocardial infarction 1996; 1997; 2000's    "total of 3" (10/22/2012)  . Hepatitis C     "diagnosed in the past 2 wk" (10/22/2012)  . Seizures     "when I get anxious" (10/22/2012)  . Anxiety   . Depression   . Bipolar affective   . Schizophrenia   . PAD (peripheral  artery disease)     2010 PTA & stent to left CIA & left SFA; 05/2012 PTA & stent to distal LCIA; 10/2012 thrombectomy and stents to LCIA and LEIA  . Renal artery stenosis     mild to moderate renal artery stenosis on the left  . History of ETOH abuse     quit 2007  . Tobacco abuse   . Broken fingers      Past Surgical History  Procedure Laterality Date  . Subdural hematoma evacuation via craniotomy    . Anterior cervical decomp/discectomy fusion  01/2011    plate & screw placement   . Iliac artery stent  10/22/2012    "2" (10/22/2012)  . Brain surgery    . Posterior fusion cervical spine  2012  . Liver biopsy  10/2012  . Cardiac catheterization      Stent - 12  . Coronary angioplasty with stent placement  03/2007    to RCA  . Iliac artery stent  06/2012    left     Family History  Problem Relation Age of Onset  . Emphysema Father   .  Diabetes Other   . Alcohol abuse Other   . Hypertension Other   . Hyperlipidemia Other   . Seizures Other      History   Social History  . Marital Status: Divorced    Spouse Name: N/A    Number of Children: N/A  . Years of Education: N/A   Occupational History  . Disabled    Social History Main Topics  . Smoking status: Current Every Day Smoker -- 1.00 packs/day for 40 years    Types: Cigarettes  . Smokeless tobacco: Never Used  . Alcohol Use: No     Comment: 10/22/2012 "last drink was 5-7 yr ago; used to have a problem w/it"  . Drug Use: No  . Sexual Activity: Yes   Other Topics Concern  . Not on file   Social History Narrative   17 years Bipolar/ADHD.   Pt gets regular exercise.     PHYSICAL EXAM   There were no vitals taken for this visit.  Constitutional: He is oriented to person, place, and time. He appears well-developed and well-nourished. No distress.  HENT: No nasal discharge.  Head: Normocephalic and atraumatic.  Eyes: Pupils are equal and round. Right eye exhibits no discharge. Left eye exhibits no discharge.  Neck: Normal range of motion. Neck supple. No JVD present. No thyromegaly present.  Cardiovascular: Normal rate, regular rhythm, normal heart sounds and. Exam reveals no gallop and no friction rub. No murmur heard.  Pulmonary/Chest: Effort normal and breath sounds normal. No stridor. No respiratory distress. He has no wheezes. He has no rales. He exhibits no tenderness.  Abdominal: Soft. Bowel sounds are normal. He exhibits no distension. There is no tenderness. There is no rebound and no guarding.  Musculoskeletal: Normal range of motion. He exhibits no edema and no tenderness.  Neurological: He is alert and oriented to person, place, and time. Coordination normal.  Skin: Skin is warm and dry. No rash noted. He is not diaphoretic. No erythema. No pallor.  Psychiatric: He has a normal mood and affect. His behavior is normal. Judgment and thought  content normal.  Vascular: Femoral pulses is palpable on both sides. Distal pulses are palpable.    EKG: Sinus bradycardia with left anterior fascicular block. Previous inferior infarct.  ASSESSMENT AND PLAN

## 2013-09-12 NOTE — Assessment & Plan Note (Signed)
The patient used to see Dr. Leavy Cella who is no longer here. He did not receive treatment. I am not aware of his previous workup. Thus, referring him to Dr. Servando Snare for evaluation and management.

## 2013-09-12 NOTE — Assessment & Plan Note (Signed)
Recent aortoiliac duplex showed patent stents. He has residual symptoms likely related to occluded internal iliac arteries.

## 2013-09-12 NOTE — Patient Instructions (Addendum)
Labs today.  Continue same medications.   Refer to Dr. Servando Snare (GI) for treatment of hepatitis C, the office will call you with an appointment  Your physician wants you to follow-up in: 6 months.  You will receive a reminder letter in the mail two months in advance. If you don't receive a letter, please call our office to schedule the follow-up appointment.

## 2013-09-12 NOTE — Assessment & Plan Note (Signed)
Continue treatment with small dose Toprol and lisinopril. He appears to be euvolemic.

## 2013-09-16 ENCOUNTER — Telehealth: Payer: Self-pay

## 2013-09-16 LAB — HEPATIC FUNCTION PANEL
ALT: 36 IU/L (ref 0–44)
AST: 23 IU/L (ref 0–40)
Albumin: 4.7 g/dL (ref 3.5–5.5)
Alkaline Phosphatase: 58 IU/L (ref 39–117)
Bilirubin, Direct: 0.18 mg/dL (ref 0.00–0.40)
Total Bilirubin: 0.7 mg/dL (ref 0.0–1.2)
Total Protein: 7 g/dL (ref 6.0–8.5)

## 2013-09-16 LAB — LIPID PANEL
Chol/HDL Ratio: 2.9 ratio units (ref 0.0–5.0)
Cholesterol, Total: 154 mg/dL (ref 100–199)
HDL: 54 mg/dL (ref >39)
LDL Calculated: 82 mg/dL (ref 0–99)
Triglycerides: 92 mg/dL (ref 0–149)
VLDL Cholesterol Cal: 18 mg/dL (ref 5–40)

## 2013-09-16 NOTE — Telephone Encounter (Signed)
Message copied by Marilynne Halsted on Tue Sep 16, 2013  8:16 AM ------      Message from: [name]      Created: Tue Sep 16, 2013  8:01 AM       Inform patient that labs were normal. Cholesterol was good. ------

## 2013-09-16 NOTE — Telephone Encounter (Signed)
Spoke w/ pt.  He is aware of results.  

## 2013-10-09 ENCOUNTER — Other Ambulatory Visit: Payer: Self-pay

## 2013-12-05 ENCOUNTER — Other Ambulatory Visit (HOSPITAL_COMMUNITY): Payer: Self-pay | Admitting: Cardiology

## 2013-12-18 ENCOUNTER — Emergency Department: Payer: Self-pay | Admitting: Emergency Medicine

## 2013-12-18 LAB — CBC WITH DIFFERENTIAL/PLATELET
Basophil #: 0.1 10*3/uL (ref 0.0–0.1)
Basophil %: 0.7 %
Eosinophil #: 0.3 10*3/uL (ref 0.0–0.7)
Eosinophil %: 3.4 %
HCT: 49.4 % (ref 40.0–52.0)
HGB: 16.9 g/dL (ref 13.0–18.0)
Lymphocyte #: 1.7 10*3/uL (ref 1.0–3.6)
Lymphocyte %: 18.7 %
MCH: 34 pg (ref 26.0–34.0)
MCHC: 34.2 g/dL (ref 32.0–36.0)
MCV: 100 fL (ref 80–100)
Monocyte #: 0.9 x10 3/mm (ref 0.2–1.0)
Monocyte %: 10.1 %
Neutrophil #: 6.1 10*3/uL (ref 1.4–6.5)
Neutrophil %: 67.1 %
Platelet: 196 10*3/uL (ref 150–440)
RBC: 4.96 10*6/uL (ref 4.40–5.90)
RDW: 14 % (ref 11.5–14.5)
WBC: 9 10*3/uL (ref 3.8–10.6)

## 2013-12-18 LAB — COMPREHENSIVE METABOLIC PANEL
Albumin: 3.8 g/dL (ref 3.4–5.0)
Alkaline Phosphatase: 50 U/L
Anion Gap: 7 (ref 7–16)
BUN: 13 mg/dL (ref 7–18)
Bilirubin,Total: 0.5 mg/dL (ref 0.2–1.0)
Calcium, Total: 9.9 mg/dL (ref 8.5–10.1)
Chloride: 103 mmol/L (ref 98–107)
Co2: 21 mmol/L (ref 21–32)
Creatinine: 1.18 mg/dL (ref 0.60–1.30)
EGFR (African American): >60
EGFR (Non-African Amer.): >60
Glucose: 123 mg/dL — ABNORMAL HIGH (ref 65–99)
Osmolality: 264 (ref 275–301)
Potassium: 4.9 mmol/L (ref 3.5–5.1)
SGOT(AST): 48 U/L — ABNORMAL HIGH (ref 15–37)
SGPT (ALT): 35 U/L (ref 12–78)
Sodium: 131 mmol/L — ABNORMAL LOW (ref 136–145)
Total Protein: 7.9 g/dL (ref 6.4–8.2)

## 2013-12-18 LAB — LITHIUM LEVEL: Lithium: 0.75 mmol/L

## 2013-12-18 LAB — DRUG SCREEN, URINE
Amphetamines, Ur Screen: NEGATIVE (ref ?–1000)
Barbiturates, Ur Screen: NEGATIVE (ref ?–200)
Benzodiazepine, Ur Scrn: POSITIVE (ref ?–200)
Cannabinoid 50 Ng, Ur ~~LOC~~: NEGATIVE (ref ?–50)
Cocaine Metabolite,Ur ~~LOC~~: NEGATIVE (ref ?–300)
MDMA (Ecstasy)Ur Screen: POSITIVE (ref ?–500)
Methadone, Ur Screen: NEGATIVE (ref ?–300)
Opiate, Ur Screen: NEGATIVE (ref ?–300)
Phencyclidine (PCP) Ur S: NEGATIVE (ref ?–25)
Tricyclic, Ur Screen: NEGATIVE (ref ?–1000)

## 2013-12-18 LAB — URINALYSIS, COMPLETE
Bacteria: NONE SEEN
Bilirubin,UR: NEGATIVE
Glucose,UR: NEGATIVE mg/dL (ref 0–75)
Hyaline Cast: 100
Leukocyte Esterase: NEGATIVE
Nitrite: NEGATIVE
Ph: 5 (ref 4.5–8.0)
Protein: 30
RBC,UR: 2 /HPF (ref 0–5)
Specific Gravity: 1.015 (ref 1.003–1.030)
Squamous Epithelial: NONE SEEN
WBC UR: 16 /HPF (ref 0–5)

## 2013-12-18 LAB — TSH: Thyroid Stimulating Horm: 1.65 u[IU]/mL

## 2013-12-18 LAB — PHENYTOIN LEVEL, TOTAL: Dilantin: 1.4 ug/mL — ABNORMAL LOW (ref 10.0–20.0)

## 2013-12-18 LAB — VALPROIC ACID LEVEL: Valproic Acid: 99 ug/mL

## 2013-12-18 LAB — ETHANOL
Ethanol %: <0.003 % (ref 0.000–0.080)
Ethanol: <3 mg/dL

## 2013-12-18 LAB — MAGNESIUM: Magnesium: 2.1 mg/dL

## 2013-12-18 LAB — LIPASE, BLOOD: Lipase: 133 U/L (ref 73–393)

## 2014-01-09 ENCOUNTER — Emergency Department: Payer: Self-pay | Admitting: Emergency Medicine

## 2014-01-09 LAB — COMPREHENSIVE METABOLIC PANEL
Albumin: 4.1 g/dL (ref 3.4–5.0)
Alkaline Phosphatase: 64 U/L
Anion Gap: 18 — ABNORMAL HIGH (ref 7–16)
BUN: 11 mg/dL (ref 7–18)
Bilirubin,Total: 0.4 mg/dL (ref 0.2–1.0)
Calcium, Total: 10.1 mg/dL (ref 8.5–10.1)
Chloride: 103 mmol/L (ref 98–107)
Co2: 15 mmol/L — ABNORMAL LOW (ref 21–32)
Creatinine: 1.36 mg/dL — ABNORMAL HIGH (ref 0.60–1.30)
EGFR (African American): >60
EGFR (Non-African Amer.): 59 — ABNORMAL LOW
Glucose: 92 mg/dL (ref 65–99)
Osmolality: 271 (ref 275–301)
Potassium: 4.4 mmol/L (ref 3.5–5.1)
SGOT(AST): 29 U/L (ref 15–37)
SGPT (ALT): 45 U/L (ref 12–78)
Sodium: 136 mmol/L (ref 136–145)
Total Protein: 8.5 g/dL — ABNORMAL HIGH (ref 6.4–8.2)

## 2014-01-09 LAB — CBC WITH DIFFERENTIAL/PLATELET
Basophil #: 0.1 10*3/uL (ref 0.0–0.1)
Basophil %: 1 %
Eosinophil #: 0.3 10*3/uL (ref 0.0–0.7)
Eosinophil %: 2.5 %
HCT: 53.4 % — ABNORMAL HIGH (ref 40.0–52.0)
HGB: 17.7 g/dL (ref 13.0–18.0)
Lymphocyte #: 4.1 10*3/uL — ABNORMAL HIGH (ref 1.0–3.6)
Lymphocyte %: 31.4 %
MCH: 33.6 pg (ref 26.0–34.0)
MCHC: 33.1 g/dL (ref 32.0–36.0)
MCV: 101 fL — ABNORMAL HIGH (ref 80–100)
Monocyte #: 1.2 x10 3/mm — ABNORMAL HIGH (ref 0.2–1.0)
Monocyte %: 9.5 %
Neutrophil #: 7.2 10*3/uL — ABNORMAL HIGH (ref 1.4–6.5)
Neutrophil %: 55.6 %
Platelet: 284 10*3/uL (ref 150–440)
RBC: 5.27 10*6/uL (ref 4.40–5.90)
RDW: 13.5 % (ref 11.5–14.5)
WBC: 13 10*3/uL — ABNORMAL HIGH (ref 3.8–10.6)

## 2014-01-09 LAB — VALPROIC ACID LEVEL: Valproic Acid: 80 ug/mL

## 2014-01-09 LAB — URINALYSIS, COMPLETE
Bilirubin,UR: NEGATIVE
Blood: NEGATIVE
Glucose,UR: NEGATIVE mg/dL (ref 0–75)
Ketone: NEGATIVE
Leukocyte Esterase: NEGATIVE
Nitrite: NEGATIVE
Ph: 5 (ref 4.5–8.0)
Protein: NEGATIVE
Specific Gravity: 1.014 (ref 1.003–1.030)

## 2014-01-09 LAB — TROPONIN I: Troponin-I: <0.02 ng/mL

## 2014-03-13 ENCOUNTER — Ambulatory Visit (INDEPENDENT_AMBULATORY_CARE_PROVIDER_SITE_OTHER): Payer: Medicaid Other | Admitting: Cardiovascular Disease

## 2014-03-13 ENCOUNTER — Encounter: Payer: Self-pay | Admitting: Cardiovascular Disease

## 2014-03-13 VITALS — BP 118/70 | HR 64 | Ht 67.0 in | Wt 165.0 lb

## 2014-03-13 DIAGNOSIS — I2510 Atherosclerotic heart disease of native coronary artery without angina pectoris: Secondary | ICD-10-CM

## 2014-03-13 DIAGNOSIS — I739 Peripheral vascular disease, unspecified: Secondary | ICD-10-CM

## 2014-03-13 DIAGNOSIS — F17200 Nicotine dependence, unspecified, uncomplicated: Secondary | ICD-10-CM

## 2014-03-13 DIAGNOSIS — E785 Hyperlipidemia, unspecified: Secondary | ICD-10-CM

## 2014-03-13 DIAGNOSIS — I10 Essential (primary) hypertension: Secondary | ICD-10-CM

## 2014-03-13 NOTE — Assessment & Plan Note (Signed)
He is doing well with no recurrent chest pain. Continue medical therapy.

## 2014-03-13 NOTE — Progress Notes (Signed)
HPI  [name] is a 53 year old gentleman who is here today for a followup visit regarding PAD and chest pain. He has a history of coronary artery disease, stent to his RCA in April 2008 with occluded circumflex, bilateral iliac stenting ,  mild to moderate renal artery stenosis on the left, history of hepatitis C, long alcohol history currently in remission, long history of smoking who continues to smoke, hypertension, bipolar disorder and hyperlipidemia .  He has chronic left buttock and thigh discomfort which could be related to a chronically occluded internal iliac artery.  Most recent cardiac catheterization in July 2014 showed patent RCA stent with chronically occluded left circumflex artery. Ejection fraction of 40%. He has been doing very well and denies any chest pain. No significant claudication. He has been compliant with his medications but not able to quit smoking.   No Known Allergies   Current Outpatient Prescriptions on File Prior to Visit  Medication Sig Dispense Refill  . amLODipine (NORVASC) 5 MG tablet TAKE 1 TABLET BY MOUTH EVERY DAY  30 tablet  5  . aspirin 81 MG EC tablet Take 81 mg by mouth daily.        Marland Kitchen buPROPion (WELLBUTRIN XL) 300 MG 24 hr tablet Take 300 mg by mouth daily.      . clonazePAM (KLONOPIN) 2 MG tablet Take 2 mg by mouth 3 (three) times daily.      . clopidogrel (PLAVIX) 75 MG tablet TAKE 1 TABLET (75 MG TOTAL) BY MOUTH DAILY.  30 tablet  6  . divalproex (DEPAKOTE) 500 MG EC tablet Take 500 mg by mouth 3 (three) times daily.       Marland Kitchen lisdexamfetamine (VYVANSE) 70 MG capsule Take 70 mg by mouth every morning.      Marland Kitchen lisinopril (PRINIVIL,ZESTRIL) 2.5 MG tablet Take 2.5 mg by mouth daily.      Marland Kitchen lithium 300 MG tablet Take 450 mg by mouth daily.       . pantoprazole (PROTONIX) 40 MG tablet Take 1 tablet (40 mg total) by mouth daily.  90 tablet  3  . atorvastatin (LIPITOR) 10 MG tablet Take 1 tablet (10 mg total) by mouth daily.  90 tablet  3    No current facility-administered medications on file prior to visit.     Past Medical History  Diagnosis Date  . Hypertension   . Hyperlipidemia   . Myocardial infarction 1996; 1997; 2000's    "total of 3" (10/22/2012)  . Hepatitis C     "diagnosed in the past 2 wk" (10/22/2012)  . Seizures     "when I get anxious" (10/22/2012)  . Anxiety   . Depression   . Bipolar affective   . Schizophrenia   . PAD (peripheral artery disease)     2010 PTA & stent to left CIA & left SFA; 05/2012 PTA & stent to distal LCIA; 10/2012 thrombectomy and stents to [place]  . Renal artery stenosis     mild to moderate renal artery stenosis on the left  . History of ETOH abuse     quit 2007  . Tobacco abuse   . Broken fingers   . Coronary artery disease     s/p PCI to RCA at [facility]; s/p PCI to RCA '08; Cath 05/2012 showed known occluded LCx, RCA stent w/ mild restenosis, LAD no significant dz, EF 35% . Cardiac cath in July of 2014 showed no significant change.  Past Surgical History  Procedure Laterality Date  . Subdural hematoma evacuation via craniotomy    . Anterior cervical decomp/discectomy fusion  01/2011    plate & screw placement   . Iliac artery stent  10/22/2012    "2" (10/22/2012)  . Brain surgery    . Posterior fusion cervical spine  2012  . Liver biopsy  10/2012  . Cardiac catheterization      Stent - 12  . Coronary angioplasty with stent placement  03/2007    to RCA  . Iliac artery stent  06/2012    left     Family History  Problem Relation Age of Onset  . Emphysema Father   . Diabetes Other   . Alcohol abuse Other   . Hypertension Other   . Hyperlipidemia Other   . Seizures Other      History   Social History  . Marital Status: Divorced    Spouse Name: N/A    Number of Children: N/A  . Years of Education: N/A   Occupational History  . Disabled    Social History Main Topics  . Smoking status: Current Every Day Smoker -- 1.00 packs/day for 40  years    Types: Cigarettes  . Smokeless tobacco: Never Used  . Alcohol Use: No     Comment: 10/22/2012 "last drink was 5-7 yr ago; used to have a problem w/it"  . Drug Use: No  . Sexual Activity: Yes   Other Topics Concern  . Not on file   Social History Narrative   17 years Bipolar/ADHD.   Pt gets regular exercise.     PHYSICAL EXAM   BP 118/70  Pulse 64  Ht 5\' 7"  (1.702 m)  Wt 165 lb (74.844 kg)  BMI 25.84 kg/m2  Constitutional: He is oriented to person, place, and time. He appears well-developed and well-nourished. No distress.  HENT: No nasal discharge.  Head: Normocephalic and atraumatic.  Eyes: Pupils are equal and round. Right eye exhibits no discharge. Left eye exhibits no discharge.  Neck: Normal range of motion. Neck supple. No JVD present. No thyromegaly present.  Cardiovascular: Normal rate, regular rhythm, normal heart sounds and. Exam reveals no gallop and no friction rub. No murmur heard.  Pulmonary/Chest: Effort normal and breath sounds normal. No stridor. No respiratory distress. He has no wheezes. He has no rales. He exhibits no tenderness.  Abdominal: Soft. Bowel sounds are normal. He exhibits no distension. There is no tenderness. There is no rebound and no guarding.  Musculoskeletal: Normal range of motion. He exhibits no edema and no tenderness.  Neurological: He is alert and oriented to person, place, and time. Coordination normal.  Skin: Skin is warm and dry. No rash noted. He is not diaphoretic. No erythema. No pallor.  Psychiatric: He has a normal mood and affect. His behavior is normal. Judgment and thought content normal.  Vascular: Femoral pulses is palpable on both sides. Distal pulses are palpable.    EKG: Normal sinus rhythm with left anterior fascicular block.  ASSESSMENT AND PLAN

## 2014-03-13 NOTE — Assessment & Plan Note (Signed)
I strongly advised him to quit smoking. 

## 2014-03-13 NOTE — Assessment & Plan Note (Signed)
Continue treatment with atorvastatin. 

## 2014-03-13 NOTE — Assessment & Plan Note (Signed)
No recurrent claudication. He is due for an aortoiliac duplex in 6 months. Continue lifelong dual antiplatelet therapy due to multiple iliac stents and previous  thrombosis.

## 2014-03-13 NOTE — Patient Instructions (Addendum)
Your physician has requested that you have an aorto-iliac duplex in 6 months. During this test, an ultrasound is used to evaluate the aorta. Allow 30 minutes for this exam. Do not eat after midnight the day before and avoid carbonated beverages  Your physician wants you to follow-up in: 6 months same day as test. You will receive a reminder letter in the mail two months in advance. If you don't receive a letter, please call our office to schedule the follow-up appointment.  Your physician recommends that you continue on your current medications as directed. Please refer to the Current Medication list given to you today.

## 2014-03-13 NOTE — Assessment & Plan Note (Signed)
Blood pressure is well controlled on current medications. 

## 2014-03-28 ENCOUNTER — Other Ambulatory Visit: Payer: Self-pay | Admitting: Cardiovascular Disease

## 2014-05-01 ENCOUNTER — Emergency Department: Payer: Self-pay | Admitting: Emergency Medicine

## 2014-05-01 LAB — CBC WITH DIFFERENTIAL/PLATELET
Basophil #: 0.1 10*3/uL (ref 0.0–0.1)
Basophil %: 1.4 %
Eosinophil #: 0.3 10*3/uL (ref 0.0–0.7)
Eosinophil %: 4.2 %
HCT: 44.5 % (ref 40.0–52.0)
HGB: 14.9 g/dL (ref 13.0–18.0)
Lymphocyte #: 1.8 10*3/uL (ref 1.0–3.6)
Lymphocyte %: 25.9 %
MCH: 32.9 pg (ref 26.0–34.0)
MCHC: 33.5 g/dL (ref 32.0–36.0)
MCV: 98 fL (ref 80–100)
Monocyte #: 0.6 x10 3/mm (ref 0.2–1.0)
Monocyte %: 9.2 %
Neutrophil #: 4 10*3/uL (ref 1.4–6.5)
Neutrophil %: 59.3 %
Platelet: 173 10*3/uL (ref 150–440)
RBC: 4.54 10*6/uL (ref 4.40–5.90)
RDW: 12.7 % (ref 11.5–14.5)
WBC: 6.8 10*3/uL (ref 3.8–10.6)

## 2014-05-01 LAB — COMPREHENSIVE METABOLIC PANEL
Albumin: 3.6 g/dL (ref 3.4–5.0)
Alkaline Phosphatase: 62 U/L
Anion Gap: 4 — ABNORMAL LOW (ref 7–16)
BUN: 9 mg/dL (ref 7–18)
Bilirubin,Total: 1.2 mg/dL — ABNORMAL HIGH (ref 0.2–1.0)
Calcium, Total: 9.3 mg/dL (ref 8.5–10.1)
Chloride: 108 mmol/L — ABNORMAL HIGH (ref 98–107)
Co2: 27 mmol/L (ref 21–32)
Creatinine: 1.05 mg/dL (ref 0.60–1.30)
EGFR (African American): >60
EGFR (Non-African Amer.): >60
Glucose: 127 mg/dL — ABNORMAL HIGH (ref 65–99)
Osmolality: 278 (ref 275–301)
Potassium: 4.2 mmol/L (ref 3.5–5.1)
SGOT(AST): 38 U/L — ABNORMAL HIGH (ref 15–37)
SGPT (ALT): 67 U/L (ref 12–78)
Sodium: 139 mmol/L (ref 136–145)
Total Protein: 7.6 g/dL (ref 6.4–8.2)

## 2014-05-01 LAB — LIPASE, BLOOD: Lipase: 147 U/L (ref 73–393)

## 2014-05-02 LAB — PROTIME-INR
INR: 0.9
Prothrombin Time: 12.1 secs (ref 11.5–14.7)

## 2014-05-02 LAB — TROPONIN I: Troponin-I: <0.02 ng/mL

## 2014-06-23 ENCOUNTER — Other Ambulatory Visit (HOSPITAL_COMMUNITY): Payer: Self-pay | Admitting: *Deleted

## 2014-06-23 DIAGNOSIS — I6529 Occlusion and stenosis of unspecified carotid artery: Secondary | ICD-10-CM

## 2014-06-30 ENCOUNTER — Encounter (INDEPENDENT_AMBULATORY_CARE_PROVIDER_SITE_OTHER): Payer: Medicaid Other

## 2014-06-30 DIAGNOSIS — I6529 Occlusion and stenosis of unspecified carotid artery: Secondary | ICD-10-CM

## 2014-10-07 ENCOUNTER — Telehealth: Payer: Self-pay | Admitting: *Deleted

## 2014-10-07 NOTE — Telephone Encounter (Signed)
Lmom to call our office. Needs to schedule  Abd Aorta + ABI (1 yr f/u).

## 2014-10-09 ENCOUNTER — Other Ambulatory Visit (HOSPITAL_COMMUNITY): Payer: Self-pay | Admitting: *Deleted

## 2014-10-09 DIAGNOSIS — I739 Peripheral vascular disease, unspecified: Secondary | ICD-10-CM

## 2014-10-26 ENCOUNTER — Telehealth: Payer: Self-pay

## 2014-10-26 DIAGNOSIS — R0789 Other chest pain: Secondary | ICD-10-CM

## 2014-10-26 NOTE — Telephone Encounter (Signed)
L mom to return the call. See note below

## 2014-10-26 NOTE — Telephone Encounter (Signed)
-----   Message from Benancio Deeds sent at 10/26/2014 10:55 AM EST ----- Needs OV within 30 days of studies.  Can we reschedule his tests until after he sees Dr. Fletcher Anon?  Thank you!!

## 2014-11-10 ENCOUNTER — Encounter: Payer: Self-pay | Admitting: Cardiovascular Disease

## 2014-11-10 ENCOUNTER — Encounter: Payer: Self-pay | Admitting: *Deleted

## 2014-11-10 ENCOUNTER — Ambulatory Visit (INDEPENDENT_AMBULATORY_CARE_PROVIDER_SITE_OTHER): Payer: Medicaid Other | Admitting: Cardiovascular Disease

## 2014-11-10 VITALS — BP 138/96 | HR 87 | Ht 67.0 in | Wt 159.0 lb

## 2014-11-10 DIAGNOSIS — I10 Essential (primary) hypertension: Secondary | ICD-10-CM

## 2014-11-10 DIAGNOSIS — I658 Occlusion and stenosis of other precerebral arteries: Secondary | ICD-10-CM

## 2014-11-10 DIAGNOSIS — I255 Ischemic cardiomyopathy: Secondary | ICD-10-CM

## 2014-11-10 DIAGNOSIS — I25118 Atherosclerotic heart disease of native coronary artery with other forms of angina pectoris: Secondary | ICD-10-CM

## 2014-11-10 DIAGNOSIS — I739 Peripheral vascular disease, unspecified: Secondary | ICD-10-CM

## 2014-11-10 MED ORDER — CLOPIDOGREL BISULFATE 75 MG PO TABS: ORAL_TABLET | ORAL | Status: DC

## 2014-11-10 NOTE — Patient Instructions (Addendum)
Your physician has recommended you make the following change in your medication:  Restart Plavix tonight   Your physician has requested that you have a peripheral vascular angiogram. This exam is performed at the hospital. During this exam IV contrast is used to look at arterial blood flow. Please review the information sheet given for details.

## 2014-11-10 NOTE — Progress Notes (Signed)
HPI  [name] is a 53 year old gentleman who is here today for a followup visit regarding PAD and chest pain. He has a history of coronary artery disease, stent to his RCA in April 2008 with occluded circumflex, bilateral iliac stenting ,  mild to moderate renal artery stenosis on the left, history of hepatitis C, long alcohol history currently in remission, long history of smoking , hypertension, bipolar disorder and hyperlipidemia .  He has chronic left buttock and thigh discomfort which could be related to a chronically occluded internal iliac artery.  Most recent cardiac catheterization in July 2014 showed patent RCA stent with chronically occluded left circumflex artery. Ejection fraction of 40%. He stopped taking Plavix on his own 2 months ago without informing us. 2 days ago while he was working in the yard, he had sudden onset of left leg pain starting from the hip area or the way down to the thigh. Since then, he has not been able to walk more than half a block without significant pain.   No Known Allergies   Current Outpatient Prescriptions on File Prior to Visit  Medication Sig Dispense Refill  . alprazolam (XANAX) 2 MG tablet Take 2 mg by mouth at bedtime as needed for sleep.    Marland Kitchen amLODipine (NORVASC) 5 MG tablet TAKE 1 TABLET BY MOUTH EVERY DAY 30 tablet 5  . ARIPiprazole (ABILIFY) 10 MG tablet Take 10 mg by mouth daily.    Marland Kitchen aspirin 81 MG EC tablet Take 81 mg by mouth daily.      Marland Kitchen atorvastatin (LIPITOR) 10 MG tablet Take 1 tablet (10 mg total) by mouth daily. 90 tablet 3  . buPROPion (WELLBUTRIN XL) 300 MG 24 hr tablet Take 300 mg by mouth daily.    . clonazePAM (KLONOPIN) 2 MG tablet Take 2 mg by mouth 3 (three) times daily.    . divalproex (DEPAKOTE) 500 MG EC tablet Take 500 mg by mouth 3 (three) times daily.     Marland Kitchen lisdexamfetamine (VYVANSE) 70 MG capsule Take 70 mg by mouth every morning.    Marland Kitchen lisinopril (PRINIVIL,ZESTRIL) 2.5 MG tablet Take 2.5 mg by mouth  daily.    Marland Kitchen lithium 300 MG tablet Take 450 mg by mouth daily.     . pantoprazole (PROTONIX) 40 MG tablet Take 1 tablet (40 mg total) by mouth daily. 90 tablet 3  . clopidogrel (PLAVIX) 75 MG tablet TAKE 1 TABLET (75 MG TOTAL) BY MOUTH DAILY. (Patient not taking: Reported on 11/10/2014) 30 tablet 6   No current facility-administered medications on file prior to visit.     Past Medical History  Diagnosis Date  . Hypertension   . Hyperlipidemia   . Myocardial infarction 1996; 1997; 2000's    "total of 3" (10/22/2012)  . Hepatitis C     "diagnosed in the past 2 wk" (10/22/2012)  . Seizures     "when I get anxious" (10/22/2012)  . Anxiety   . Depression   . Bipolar affective   . Schizophrenia   . PAD (peripheral artery disease)     2010 PTA & stent to left CIA & left SFA; 05/2012 PTA & stent to distal LCIA; 10/2012 thrombectomy and stents to [place]  . Renal artery stenosis     mild to moderate renal artery stenosis on the left  . History of ETOH abuse     quit 2007  . Tobacco abuse   . Broken fingers   . Coronary  artery disease     s/p PCI to RCA at [facility]; s/p PCI to RCA '08; Cath 05/2012 showed known occluded LCx, RCA stent w/ mild restenosis, LAD no significant dz, EF 35% . Cardiac cath in July of 2014 showed no significant change.      Past Surgical History  Procedure Laterality Date  . Subdural hematoma evacuation via craniotomy    . Anterior cervical decomp/discectomy fusion  01/2011    plate & screw placement   . Iliac artery stent  10/22/2012    "2" (10/22/2012)  . Brain surgery    . Posterior fusion cervical spine  2012  . Liver biopsy  10/2012  . Cardiac catheterization      Stent - 12  . Coronary angioplasty with stent placement  03/2007    to RCA  . Iliac artery stent  06/2012    left     Family History  Problem Relation Age of Onset  . Emphysema Father   . Diabetes Other   . Alcohol abuse Other   . Hypertension Other   . Hyperlipidemia Other     . Seizures Other      History   Social History  . Marital Status: Divorced    Spouse Name: N/A    Number of Children: N/A  . Years of Education: N/A   Occupational History  . Disabled    Social History Main Topics  . Smoking status: Current Every Day Smoker -- 1.00 packs/day for 40 years    Types: Cigarettes  . Smokeless tobacco: Never Used  . Alcohol Use: No     Comment: 10/22/2012 "last drink was 5-7 yr ago; used to have a problem w/it"  . Drug Use: No  . Sexual Activity: Yes   Other Topics Concern  . Not on file   Social History Narrative   17 years Bipolar/ADHD.   Pt gets regular exercise.     PHYSICAL EXAM   BP 138/96 mmHg  Pulse 87  Ht 5\' 7"  (1.702 m)  Wt 159 lb (72.122 kg)  BMI 24.90 kg/m2  Constitutional: He is oriented to person, place, and time. He appears well-developed and well-nourished. No distress.  HENT: No nasal discharge.  Head: Normocephalic and atraumatic.  Eyes: Pupils are equal and round. Right eye exhibits no discharge. Left eye exhibits no discharge.  Neck: Normal range of motion. Neck supple. No JVD present. No thyromegaly present.  Cardiovascular: Normal rate, regular rhythm, normal heart sounds and. Exam reveals no gallop and no friction rub. No murmur heard.  Pulmonary/Chest: Effort normal and breath sounds normal. No stridor. No respiratory distress. He has no wheezes. He has no rales. He exhibits no tenderness.  Abdominal: Soft. Bowel sounds are normal. He exhibits no distension. There is no tenderness. There is no rebound and no guarding.  Musculoskeletal: Normal range of motion. He exhibits no edema and no tenderness.  Neurological: He is alert and oriented to person, place, and time. Coordination normal.  Skin: Skin is warm and dry. No rash noted. He is not diaphoretic. No erythema. No pallor.  Psychiatric: He has a normal mood and affect. His behavior is normal. Judgment and thought content normal.  Vascular: Femoral pulses +2  on the right side. Absent on the left side. Dorsalis pedis: +2 on the right side and absent on the left side      ASSESSMENT AND PLAN

## 2014-11-10 NOTE — Assessment & Plan Note (Signed)
It appears that the patient acutely occluded his left iliac stents. He stopped taking Plavix on his own about 2 months ago. There is currently no rest pain.  I recommend proceeding with urgent abdominal aortogram, iliac angiography and possible endovascular intervention. This would be scheduled for tomorrow. I resumed treatment with Plavix.

## 2014-11-10 NOTE — Assessment & Plan Note (Signed)
Blood pressure is mildly elevated. Continue to monitor for now.

## 2014-11-10 NOTE — Assessment & Plan Note (Signed)
He reports one brief episode of chest pain over the weekend. Otherwise, he has been stable.

## 2014-11-11 ENCOUNTER — Ambulatory Visit (HOSPITAL_COMMUNITY)
Admission: RE | Admit: 2014-11-11 | Discharge: 2014-11-11 | Disposition: A | Discharge status: Home / Self Care | Payer: Medicaid Other | Source: Ambulatory Visit | Attending: Cardiovascular Disease | Admitting: Cardiovascular Disease

## 2014-11-11 ENCOUNTER — Encounter (HOSPITAL_COMMUNITY)
Admission: RE | Disposition: A | Discharge status: Home / Self Care | Payer: Self-pay | Source: Ambulatory Visit | Attending: Cardiovascular Disease

## 2014-11-11 ENCOUNTER — Encounter: Payer: Self-pay | Admitting: Vascular Surgery

## 2014-11-11 DIAGNOSIS — I10 Essential (primary) hypertension: Secondary | ICD-10-CM | POA: Insufficient documentation

## 2014-11-11 DIAGNOSIS — F319 Bipolar disorder, unspecified: Secondary | ICD-10-CM | POA: Diagnosis not present

## 2014-11-11 DIAGNOSIS — F329 Major depressive disorder, single episode, unspecified: Secondary | ICD-10-CM | POA: Insufficient documentation

## 2014-11-11 DIAGNOSIS — I252 Old myocardial infarction: Secondary | ICD-10-CM | POA: Insufficient documentation

## 2014-11-11 DIAGNOSIS — F209 Schizophrenia, unspecified: Secondary | ICD-10-CM | POA: Insufficient documentation

## 2014-11-11 DIAGNOSIS — I739 Peripheral vascular disease, unspecified: Secondary | ICD-10-CM | POA: Insufficient documentation

## 2014-11-11 DIAGNOSIS — F17210 Nicotine dependence, cigarettes, uncomplicated: Secondary | ICD-10-CM | POA: Diagnosis not present

## 2014-11-11 DIAGNOSIS — Z9861 Coronary angioplasty status: Secondary | ICD-10-CM | POA: Diagnosis not present

## 2014-11-11 DIAGNOSIS — E785 Hyperlipidemia, unspecified: Secondary | ICD-10-CM | POA: Insufficient documentation

## 2014-11-11 DIAGNOSIS — I2510 Atherosclerotic heart disease of native coronary artery without angina pectoris: Secondary | ICD-10-CM | POA: Insufficient documentation

## 2014-11-11 DIAGNOSIS — F419 Anxiety disorder, unspecified: Secondary | ICD-10-CM | POA: Insufficient documentation

## 2014-11-11 DIAGNOSIS — I70213 Atherosclerosis of native arteries of extremities with intermittent claudication, bilateral legs: Secondary | ICD-10-CM

## 2014-11-11 DIAGNOSIS — I701 Atherosclerosis of renal artery: Secondary | ICD-10-CM | POA: Diagnosis not present

## 2014-11-11 DIAGNOSIS — Z7982 Long term (current) use of aspirin: Secondary | ICD-10-CM | POA: Diagnosis not present

## 2014-11-11 DIAGNOSIS — I70412 Atherosclerosis of autologous vein bypass graft(s) of the extremities with intermittent claudication, left leg: Secondary | ICD-10-CM

## 2014-11-11 HISTORY — PX: ABDOMINAL AORTAGRAM: SHX5454

## 2014-11-11 LAB — CBC WITH DIFFERENTIAL

## 2014-11-11 LAB — CBC
HCT: 47.8 % (ref 39.0–52.0)
Hemoglobin: 16.6 g/dL (ref 13.0–17.0)
MCH: 34.1 pg — ABNORMAL HIGH (ref 26.0–34.0)
MCHC: 34.7 g/dL (ref 30.0–36.0)
MCV: 98.2 fL (ref 78.0–100.0)
Platelets: 175 10*3/uL (ref 150–400)
RBC: 4.87 MIL/uL (ref 4.22–5.81)
RDW: 13.4 % (ref 11.5–15.5)
WBC: 10.3 10*3/uL (ref 4.0–10.5)

## 2014-11-11 LAB — BASIC METABOLIC PANEL
BUN/Creatinine Ratio: 11 (ref 9–20)
BUN: 12 mg/dL (ref 6–24)
CO2: 23 mmol/L (ref 18–29)
Calcium: 10.2 mg/dL (ref 8.7–10.2)
Chloride: 99 mmol/L (ref 97–108)
Creatinine, Ser: 1.08 mg/dL (ref 0.76–1.27)
GFR calc Af Amer: 89 mL/min/{1.73_m2} (ref >59)
GFR calc non Af Amer: 77 mL/min/{1.73_m2} (ref >59)
Glucose: 97 mg/dL (ref 65–99)
Potassium: 5.1 mmol/L (ref 3.5–5.2)
Sodium: 137 mmol/L (ref 134–144)

## 2014-11-11 LAB — PROTIME-INR
INR: 1.1 (ref 0.8–1.2)
Prothrombin Time: 11.4 s (ref 9.1–12.0)

## 2014-11-11 SURGERY — ABDOMINAL AORTAGRAM
Anesthesia: LOCAL

## 2014-11-11 MED ORDER — LIDOCAINE HCL (PF) 1 % IJ SOLN
INTRAMUSCULAR | Status: AC
  Filled 2014-11-11: qty 30

## 2014-11-11 MED ORDER — OXYCODONE-ACETAMINOPHEN 5-325 MG PO TABS
1.0000 | ORAL_TABLET | Freq: Four times a day (QID) | ORAL | Status: DC
  Administered 2014-11-11: 1 via ORAL

## 2014-11-11 MED ORDER — SODIUM CHLORIDE 0.9 % IJ SOLN: 3.0000 mL | INTRAMUSCULAR | Status: DC | PRN

## 2014-11-11 MED ORDER — FENTANYL CITRATE 0.05 MG/ML IJ SOLN
INTRAMUSCULAR | Status: AC
  Filled 2014-11-11: qty 2

## 2014-11-11 MED ORDER — HEPARIN (PORCINE) IN NACL 2-0.9 UNIT/ML-% IJ SOLN
INTRAMUSCULAR | Status: AC
  Filled 2014-11-11: qty 1000

## 2014-11-11 MED ORDER — ASPIRIN 81 MG PO CHEW: 81.0000 mg | CHEWABLE_TABLET | ORAL | Status: DC

## 2014-11-11 MED ORDER — MIDAZOLAM HCL 2 MG/2ML IJ SOLN
INTRAMUSCULAR | Status: AC
  Filled 2014-11-11: qty 2

## 2014-11-11 MED ORDER — SODIUM CHLORIDE 0.9 % IV SOLN: 250.0000 mL | INTRAVENOUS | Status: DC | PRN

## 2014-11-11 MED ORDER — SODIUM CHLORIDE 0.9 % IV SOLN: INTRAVENOUS | Status: DC

## 2014-11-11 MED ORDER — OXYCODONE-ACETAMINOPHEN 5-325 MG PO TABS
ORAL_TABLET | ORAL | Status: AC
  Filled 2014-11-11: qty 1

## 2014-11-11 MED ORDER — SODIUM CHLORIDE 0.9 % IV SOLN
INTRAVENOUS | Status: DC
  Administered 2014-11-11: 09:00:00 via INTRAVENOUS

## 2014-11-11 MED ORDER — OXYCODONE-ACETAMINOPHEN 5-325 MG PO TABS: 1.0000 | ORAL_TABLET | ORAL | Status: DC | PRN

## 2014-11-11 MED ORDER — SODIUM CHLORIDE 0.9 % IJ SOLN: 3.0000 mL | Freq: Two times a day (BID) | INTRAMUSCULAR | Status: DC

## 2014-11-11 NOTE — H&P (View-Only) (Signed)
HPI  [name] is a 53 year old gentleman who is here today for a followup visit regarding PAD and chest pain. He has a history of coronary artery disease, stent to his RCA in April 2008 with occluded circumflex, bilateral iliac stenting ,  mild to moderate renal artery stenosis on the left, history of hepatitis C, long alcohol history currently in remission, long history of smoking , hypertension, bipolar disorder and hyperlipidemia .  He has chronic left buttock and thigh discomfort which could be related to a chronically occluded internal iliac artery.  Most recent cardiac catheterization in July 2014 showed patent RCA stent with chronically occluded left circumflex artery. Ejection fraction of 40%. He stopped taking Plavix on his own 2 months ago without informing us. 2 days ago while he was working in the yard, he had sudden onset of left leg pain starting from the hip area or the way down to the thigh. Since then, he has not been able to walk more than half a block without significant pain.   No Known Allergies   Current Outpatient Prescriptions on File Prior to Visit  Medication Sig Dispense Refill  . alprazolam (XANAX) 2 MG tablet Take 2 mg by mouth at bedtime as needed for sleep.    Marland Kitchen amLODipine (NORVASC) 5 MG tablet TAKE 1 TABLET BY MOUTH EVERY DAY 30 tablet 5  . ARIPiprazole (ABILIFY) 10 MG tablet Take 10 mg by mouth daily.    Marland Kitchen aspirin 81 MG EC tablet Take 81 mg by mouth daily.      Marland Kitchen atorvastatin (LIPITOR) 10 MG tablet Take 1 tablet (10 mg total) by mouth daily. 90 tablet 3  . buPROPion (WELLBUTRIN XL) 300 MG 24 hr tablet Take 300 mg by mouth daily.    . clonazePAM (KLONOPIN) 2 MG tablet Take 2 mg by mouth 3 (three) times daily.    . divalproex (DEPAKOTE) 500 MG EC tablet Take 500 mg by mouth 3 (three) times daily.     Marland Kitchen lisdexamfetamine (VYVANSE) 70 MG capsule Take 70 mg by mouth every morning.    Marland Kitchen lisinopril (PRINIVIL,ZESTRIL) 2.5 MG tablet Take 2.5 mg by mouth  daily.    Marland Kitchen lithium 300 MG tablet Take 450 mg by mouth daily.     . pantoprazole (PROTONIX) 40 MG tablet Take 1 tablet (40 mg total) by mouth daily. 90 tablet 3  . clopidogrel (PLAVIX) 75 MG tablet TAKE 1 TABLET (75 MG TOTAL) BY MOUTH DAILY. (Patient not taking: Reported on 11/10/2014) 30 tablet 6   No current facility-administered medications on file prior to visit.     Past Medical History  Diagnosis Date  . Hypertension   . Hyperlipidemia   . Myocardial infarction 1996; 1997; 2000's    "total of 3" (10/22/2012)  . Hepatitis C     "diagnosed in the past 2 wk" (10/22/2012)  . Seizures     "when I get anxious" (10/22/2012)  . Anxiety   . Depression   . Bipolar affective   . Schizophrenia   . PAD (peripheral artery disease)     2010 PTA & stent to left CIA & left SFA; 05/2012 PTA & stent to distal LCIA; 10/2012 thrombectomy and stents to [place]  . Renal artery stenosis     mild to moderate renal artery stenosis on the left  . History of ETOH abuse     quit 2007  . Tobacco abuse   . Broken fingers   . Coronary  artery disease     s/p PCI to RCA at [facility]; s/p PCI to RCA '08; Cath 05/2012 showed known occluded LCx, RCA stent w/ mild restenosis, LAD no significant dz, EF 35% . Cardiac cath in July of 2014 showed no significant change.      Past Surgical History  Procedure Laterality Date  . Subdural hematoma evacuation via craniotomy    . Anterior cervical decomp/discectomy fusion  01/2011    plate & screw placement   . Iliac artery stent  10/22/2012    "2" (10/22/2012)  . Brain surgery    . Posterior fusion cervical spine  2012  . Liver biopsy  10/2012  . Cardiac catheterization      Stent - 12  . Coronary angioplasty with stent placement  03/2007    to RCA  . Iliac artery stent  06/2012    left     Family History  Problem Relation Age of Onset  . Emphysema Father   . Diabetes Other   . Alcohol abuse Other   . Hypertension Other   . Hyperlipidemia Other     . Seizures Other      History   Social History  . Marital Status: Divorced    Spouse Name: N/A    Number of Children: N/A  . Years of Education: N/A   Occupational History  . Disabled    Social History Main Topics  . Smoking status: Current Every Day Smoker -- 1.00 packs/day for 40 years    Types: Cigarettes  . Smokeless tobacco: Never Used  . Alcohol Use: No     Comment: 10/22/2012 "last drink was 5-7 yr ago; used to have a problem w/it"  . Drug Use: No  . Sexual Activity: Yes   Other Topics Concern  . Not on file   Social History Narrative   17 years Bipolar/ADHD.   Pt gets regular exercise.     PHYSICAL EXAM   BP 138/96 mmHg  Pulse 87  Ht 5\' 7"  (1.702 m)  Wt 159 lb (72.122 kg)  BMI 24.90 kg/m2  Constitutional: He is oriented to person, place, and time. He appears well-developed and well-nourished. No distress.  HENT: No nasal discharge.  Head: Normocephalic and atraumatic.  Eyes: Pupils are equal and round. Right eye exhibits no discharge. Left eye exhibits no discharge.  Neck: Normal range of motion. Neck supple. No JVD present. No thyromegaly present.  Cardiovascular: Normal rate, regular rhythm, normal heart sounds and. Exam reveals no gallop and no friction rub. No murmur heard.  Pulmonary/Chest: Effort normal and breath sounds normal. No stridor. No respiratory distress. He has no wheezes. He has no rales. He exhibits no tenderness.  Abdominal: Soft. Bowel sounds are normal. He exhibits no distension. There is no tenderness. There is no rebound and no guarding.  Musculoskeletal: Normal range of motion. He exhibits no edema and no tenderness.  Neurological: He is alert and oriented to person, place, and time. Coordination normal.  Skin: Skin is warm and dry. No rash noted. He is not diaphoretic. No erythema. No pallor.  Psychiatric: He has a normal mood and affect. His behavior is normal. Judgment and thought content normal.  Vascular: Femoral pulses +2  on the right side. Absent on the left side. Dorsalis pedis: +2 on the right side and absent on the left side      ASSESSMENT AND PLAN

## 2014-11-11 NOTE — Progress Notes (Signed)
Site area: RFA Site Prior to Removal:  Level 0 Pressure Applied For:32min Manual:   y Patient Status During Pull:  stable Post Pull Site:  Level 0 Post Pull Instructions Given:  y Post Pull Pulses Present: palpable Dressing Applied:  clear Bedrest begins @ 1500 Comments:

## 2014-11-11 NOTE — CV Procedure (Signed)
    PERIPHERAL VASCULAR PROCEDURE  NAME:  [name]   MRN: [id number] DOB:  02/27/1961   ADMIT DATE: 11/11/2014  Performing Cardiologist: Kathlyn Sacramento Primary Physician: Gayland Curry, MD  Procedures Performed:  Abdominal Aortic Angiogram with Bi-Iliofemoral Runoff  Bilateral Lower Extremity Angiography (1st Order)    Indication(s):   Severe Claudication    Consent: The procedure with Risks/Benefits/Alternatives and Indications was reviewed with the patient .  All questions were answered.  Medications:  Sedation:  1 mg IV Versed, 50 mcg IV Fentanyl  Contrast:  131 ml  Visipaque   Procedural details: The right groin was prepped, draped, and anesthetized with 1% lidocaine. Using modified Seldinger technique, a 5 French sheath was introduced into the right common femoral artery. A 5 Fr Short Pigtail Catheter was advanced of over a  Versicore wire into the descending Aorta to a level just above the renal arteries. A power injection of 49ml/sec contrast over 1 sec was performed for Abdominal Aortic Angiography.  The catheter was then pulled back to a level just above the Aortic bifurcation, and a second power injection was performed to evaluate the iliac arteries.   Bilateral lower extremity arterial run off was then performed via power injection of 7 ml / sec contrast for a total of 77 ml.   The patient tolerated the procedure well with no immediate complications.    Hemodynamics:  Central Aortic Pressure / Mean Aortic Pressure: 120/70  Findings:  Abdominal aorta: Normal in size with no evidence of aneurysm. There is mild atherosclerosis.  Left renal artery: No significant disease.  Right renal artery: No significant disease.  Celiac artery: Not well visualized  Superior mesenteric artery: Patent  Right common iliac artery: Multiple overlapped stents are noted which are patent with no significant restenosis.  Right internal iliac artery: Occluded  Right  external iliac artery: Multiple overlapped stent which are patent with minor restenosis.  Right common femoral artery: 20% stenosis.  Right profunda femoral artery: Normal  Right superficial femoral artery: Normal  Right popliteal artery: Normal  Three-vessel runoff below the knee.  Left common iliac artery:  Multiple overlapped stents are noted all the way to the ostium. The vessel is occluded at the ostium with reconstitution distally via lumbar branches into the common femoral.  Left internal iliac artery: Known to be occluded  Left external iliac artery: Occluded  Left common femoral artery: Underfilling but seems to be normal.  Left profunda femoral artery: Normal  Left superficial femoral artery:  Normal  Left popliteal artery: Normal  Three-vessel runoff below the knee.   Conclusions: 1. Patent right iliac artery stents. 2. Occluded left common and external iliac artery stents. 3. No significant infrainguinal disease.  Recommendations:  The patient had multiple endovascular interventions and stent placements to bilateral iliac arteries. He is now again occluded on the left side. I recommend an aortobifemoral bypass. I will evaluate his cardiac status with a nuclear stress test. If not a candidate for an aortobifem bypass, then a right to left femorofemoral bypass would be a reasonable alternative. I consulted Dr. Kellie Simmering.   Kathlyn Sacramento, MD, [facility] 11/11/2014 2:13 PM

## 2014-11-11 NOTE — Interval H&P Note (Signed)
History and Physical Interval Note:  11/11/2014 1:25 PM  [name]  has presented today for surgery, with the diagnosis of pad  The various methods of treatment have been discussed with the patient and family. After consideration of risks, benefits and other options for treatment, the patient has consented to  Procedure(s): ABDOMINAL AORTAGRAM (N/A) as a surgical intervention .  The patient's history has been reviewed, patient examined, no change in status, stable for surgery.  I have reviewed the patient's chart and labs.  Questions were answered to the patient's satisfaction.     Kathlyn Sacramento

## 2014-11-11 NOTE — H&P (Signed)
Vascular Surgery H&P  Chief Complaint: Severe pain in left leg with ambulation  HPI: [name] is a 53 y.o. male who presents for evaluation of severe pain in left leg with ambulation. Patient was evaluated by Dr. Jacqulyn Cane for a 3 day history of acute onset of pain in the left leg. He is limited to walking about 25-30 feet before he develops aching discomfort. He has a history of multiple stents in both iliac arteries the most recent was about 2 years ago. Angiogram was performed by Dr. Fletcher Anon who found that the entire left iliac system was thrombosed in the right iliac system was widely patent. Patient has been noncompliant with his Plavix therapy. He continues to smoke one half pack of cigarettes per day and has smoked for 40+ years up to 2 packs per day. He denies rest pain in the foot or numbness in the foot but is unable to ambulate greater than 50 feet. He is evaluated for possible aortobifemoral bypass grafting.   Past Medical History  Diagnosis Date  . Hypertension   . Hyperlipidemia   . Myocardial infarction 1996; 1997; 2000's    "total of 3" (10/22/2012)  . Hepatitis C     "diagnosed in the past 2 wk" (10/22/2012)  . Seizures     "when I get anxious" (10/22/2012)  . Anxiety   . Depression   . Bipolar affective   . Schizophrenia   . PAD (peripheral artery disease)     2010 PTA & stent to left CIA & left SFA; 05/2012 PTA & stent to distal LCIA; 10/2012 thrombectomy and stents to [place]  . Renal artery stenosis     mild to moderate renal artery stenosis on the left  . History of ETOH abuse     quit 2007  . Tobacco abuse   . Broken fingers   . Coronary artery disease     s/p PCI to RCA at [facility]; s/p PCI to RCA '08; Cath 05/2012 showed known occluded LCx, RCA stent w/ mild restenosis, LAD no significant dz, EF 35% . Cardiac cath in July of 2014 showed no significant change.    Past Surgical History  Procedure Laterality Date  . Subdural hematoma  evacuation via craniotomy    . Anterior cervical decomp/discectomy fusion  01/2011    plate & screw placement   . Iliac artery stent  10/22/2012    "2" (10/22/2012)  . Brain surgery    . Posterior fusion cervical spine  2012  . Liver biopsy  10/2012  . Cardiac catheterization      Stent - 12  . Coronary angioplasty with stent placement  03/2007    to RCA  . Iliac artery stent  06/2012    left   History   Social History  . Marital Status: Divorced    Spouse Name: N/A    Number of Children: N/A  . Years of Education: N/A   Occupational History  . Disabled    Social History Main Topics  . Smoking status: Current Every Day Smoker -- 1.00 packs/day for 40 years    Types: Cigarettes  . Smokeless tobacco: Never Used  . Alcohol Use: No     Comment: 10/22/2012 "last drink was 5-7 yr ago; used to have a problem w/it"  . Drug Use: No  . Sexual Activity: Yes   Other Topics Concern  . Not on file   Social History Narrative   17 years Bipolar/ADHD.  Pt gets regular exercise.   Family History  Problem Relation Age of Onset  . Emphysema Father   . Diabetes Other   . Alcohol abuse Other   . Hypertension Other   . Hyperlipidemia Other   . Seizures Other    No Known Allergies Prior to Admission medications   Medication Sig Start Date End Date Taking? Authorizing Provider  ABILIFY 20 MG tablet Take 20 mg by mouth at bedtime. 10/30/14   Historical Provider, MD  alprazolam Duanne Moron) 2 MG tablet Take 2 mg by mouth 3 (three) times daily as needed for sleep or anxiety.     Historical Provider, MD  amLODipine (NORVASC) 5 MG tablet TAKE 1 TABLET BY MOUTH EVERY DAY    Wellington Hampshire, MD  aspirin 81 MG EC tablet Take 81 mg by mouth daily.      Historical Provider, MD  atorvastatin (LIPITOR) 10 MG tablet Take 1 tablet (10 mg total) by mouth daily. Patient not taking: Reported on 11/10/2014 06/28/12 11/10/14  Minna Merritts, MD  buPROPion (WELLBUTRIN XL) 300 MG 24 hr tablet Take 300 mg by  mouth daily.    Historical Provider, MD  clopidogrel (PLAVIX) 75 MG tablet TAKE 1 TABLET (75 MG TOTAL) BY MOUTH DAILY. 11/10/14   Wellington Hampshire, MD  divalproex (DEPAKOTE) 500 MG EC tablet Take 500 mg by mouth 3 (three) times daily.     Historical Provider, MD  lisdexamfetamine (VYVANSE) 70 MG capsule Take 70 mg by mouth every morning.    Historical Provider, MD  lisinopril (PRINIVIL,ZESTRIL) 2.5 MG tablet Take 2.5 mg by mouth daily.    Historical Provider, MD  lithium carbonate (ESKALITH) 450 MG CR tablet Take 450 mg by mouth 2 (two) times daily. 11/01/14   Historical Provider, MD  pantoprazole (PROTONIX) 40 MG tablet Take 1 tablet (40 mg total) by mouth daily. 01/19/12   Minna Merritts, MD     Positive ROS: Has occasional mild chest tightness which she has had for several years. Most recent nuclear stress test revealed EF of about 40%. Cardiac cath revealed total occlusion of his right coronary artery. Denies dyspnea on exertion, PND, orthopnea, hemoptysis, lateralizing weakness, aphasia, amaurosis fugax, diplopia, syncope. Has history of anterior cervical spine surgery.  All other systems have been reviewed and were otherwise negative with the exception of those mentioned in the HPI and as above.  Physical Exam: There were no vitals filed for this visit.  General: Alert, no acute distress HEENT: Normal for age Cardiovascular: Regular rate and rhythm. Carotid pulses 2+, no bruits audible Respiratory: Clear to auscultation. No cyanosis, no use of accessory musculature GI: No organomegaly, abdomen is soft and non-tender Skin: No lesions in the area of chief complaint Neurologic: Sensation intact distally Psychiatric: Patient is competent for consent with normal mood and affect Musculoskeletal: No obvious deformities Extremities: Right leg with 3+ femoral popliteal dorsalis pedis and posterior tibial pulses palpable. Left leg with absent femoral and distal pulses. Motion and sensation  intact left foot with no calf tenderness.    Imaging reviewed: Angiograms reviewed and reveal widely patent aorta and stents to right iliac system are patent at this time. Total occlusion of left iliac system flush at aortic bifurcation down to external iliac artery level with reconstitution left common femoral artery and good runoff left leg   Assessment/Plan:  Discussed situation with Dr.Arida and we both feel that the best plan would be aortobifemoral bypass graft in this 53 year old male 2 decrease the  likelihood that he will need further procedures on his aortoiliac system in the future. Plan nuclear stress test over next few days and if that is stable and acceptable plan aortobifemoral bypass grafting on Friday, December 18. Recent benefits thoroughly discussed with patient and possibility of intraoperative cardiac event discussed and he would like proceed. He will discontinue his Plavix after Thursday of this week-tomorrow.   Tinnie Gens, MD 11/11/2014 2:42 PM

## 2014-11-11 NOTE — Discharge Instructions (Signed)

## 2014-11-12 ENCOUNTER — Encounter (HOSPITAL_COMMUNITY): Payer: Self-pay | Admitting: Cardiovascular Disease

## 2014-11-12 NOTE — Telephone Encounter (Signed)
-----   Message from Wellington Hampshire, MD sent at 11/11/2014  2:25 PM EST ----- Schedule him for a lexiscan stress test on Friday or Monday (chest pain, preop).  He is being discharged later today from [facility]. Get in touch with him tomorrow.

## 2014-11-12 NOTE — Telephone Encounter (Signed)
[place]  Your caregiver has ordered a Stress Test with nuclear imaging. The purpose of this test is to evaluate the blood supply to your heart muscle. This procedure is referred to as a "Non-Invasive Stress Test." This is because other than having an IV started in your vein, nothing is inserted or "invades" your body. Cardiac stress tests are done to find areas of poor blood flow to the heart by determining the extent of coronary artery disease (CAD). Some patients exercise on a treadmill, which naturally increases the blood flow to your heart, while others who are  unable to walk on a treadmill due to physical limitations have a pharmacologic/chemical stress agent called Lexiscan . This medicine will mimic walking on a treadmill by temporarily increasing your coronary blood flow.   Please note: these test may take anywhere between 2-4 hours to complete  PLEASE REPORT TO [facility] AT THE FIRST DESK WILL DIRECT YOU WHERE TO GO  Date of Procedure:______12/14/15_______________________________  Arrival Time for Procedure:_____0715 am_________________________    PLEASE NOTIFY THE OFFICE AT LEAST 24 HOURS IN ADVANCE IF YOU ARE UNABLE TO KEEP YOUR APPOINTMENT.  [phone] AND  PLEASE NOTIFY NUCLEAR MEDICINE AT [facility] AT LEAST 24 HOURS IN ADVANCE IF YOU ARE UNABLE TO KEEP YOUR APPOINTMENT. [phone]  How to prepare for your Myoview test:  1. Do not eat or drink after midnight 2. No caffeine for 24 hours prior to test 3. No smoking 24 hours prior to test. 4. Your medication may be taken with water.  If your doctor stopped a medication because of this test, do not take that medication. 5. Ladies, please do not wear dresses.  Skirts or pants are appropriate. Please wear a short sleeve shirt. 6. No perfume, cologne or lotion. 7. Wear comfortable walking shoes. No heels!       Reviewed instructions with patient  Patient verbalized understanding

## 2014-11-16 ENCOUNTER — Encounter: Payer: Self-pay | Admitting: Cardiovascular Disease

## 2014-11-16 ENCOUNTER — Other Ambulatory Visit: Payer: Self-pay

## 2014-11-16 ENCOUNTER — Ambulatory Visit: Payer: Self-pay | Admitting: Cardiovascular Disease

## 2014-11-16 DIAGNOSIS — R079 Chest pain, unspecified: Secondary | ICD-10-CM

## 2014-11-17 ENCOUNTER — Encounter (HOSPITAL_COMMUNITY): Payer: Self-pay

## 2014-11-17 ENCOUNTER — Encounter (HOSPITAL_COMMUNITY)
Admission: RE | Admit: 2014-11-17 | Discharge: 2014-11-17 | Disposition: A | Discharge status: Home / Self Care | Payer: Medicaid Other | Source: Ambulatory Visit | Attending: Vascular Surgery | Admitting: Vascular Surgery

## 2014-11-17 DIAGNOSIS — Z01812 Encounter for preprocedural laboratory examination: Secondary | ICD-10-CM | POA: Insufficient documentation

## 2014-11-17 HISTORY — DX: Gastro-esophageal reflux disease without esophagitis: K21.9

## 2014-11-17 LAB — SURGICAL PCR SCREEN
MRSA, PCR: NEGATIVE
Staphylococcus aureus: NEGATIVE

## 2014-11-17 LAB — COMPREHENSIVE METABOLIC PANEL
ALT: 10 U/L (ref 0–53)
AST: 15 U/L (ref 0–37)
Albumin: 3.8 g/dL (ref 3.5–5.2)
Alkaline Phosphatase: 55 U/L (ref 39–117)
Anion gap: 14 (ref 5–15)
BUN: 13 mg/dL (ref 6–23)
CO2: 22 mEq/L (ref 19–32)
Calcium: 10.3 mg/dL (ref 8.4–10.5)
Chloride: 102 mEq/L (ref 96–112)
Creatinine, Ser: 0.86 mg/dL (ref 0.50–1.35)
GFR calc Af Amer: >90 mL/min (ref >90)
GFR calc non Af Amer: >90 mL/min (ref >90)
Glucose, Bld: 111 mg/dL — ABNORMAL HIGH (ref 70–99)
Potassium: 4.6 mEq/L (ref 3.7–5.3)
Sodium: 138 mEq/L (ref 137–147)
Total Bilirubin: 0.4 mg/dL (ref 0.3–1.2)
Total Protein: 7.3 g/dL (ref 6.0–8.3)

## 2014-11-17 LAB — URINALYSIS, ROUTINE W REFLEX MICROSCOPIC
Bilirubin Urine: NEGATIVE
Glucose, UA: NEGATIVE mg/dL
Hgb urine dipstick: NEGATIVE
Ketones, ur: NEGATIVE mg/dL
Leukocytes, UA: NEGATIVE
Nitrite: NEGATIVE
Protein, ur: NEGATIVE mg/dL
Specific Gravity, Urine: 1.028 (ref 1.005–1.030)
Urobilinogen, UA: 1 mg/dL (ref 0.0–1.0)
pH: 6 (ref 5.0–8.0)

## 2014-11-17 LAB — BLOOD GAS, ARTERIAL
Acid-Base Excess: 0.3 mmol/L (ref 0.0–2.0)
Bicarbonate: 24.5 mEq/L — ABNORMAL HIGH (ref 20.0–24.0)
Drawn by: 206361
O2 Saturation: 96.6 %
Patient temperature: 98.6
TCO2: 25.7 mmol/L (ref 0–100)
pCO2 arterial: 40.2 mmHg (ref 35.0–45.0)
pH, Arterial: 7.401 (ref 7.350–7.450)
pO2, Arterial: 86 mmHg (ref 80.0–100.0)

## 2014-11-17 LAB — PREPARE RBC (CROSSMATCH)

## 2014-11-17 LAB — PROTIME-INR
INR: 1.04 (ref 0.00–1.49)
Prothrombin Time: 13.7 seconds (ref 11.6–15.2)

## 2014-11-17 LAB — CBC
HCT: 44.6 % (ref 39.0–52.0)
Hemoglobin: 15.1 g/dL (ref 13.0–17.0)
MCH: 32.2 pg (ref 26.0–34.0)
MCHC: 33.9 g/dL (ref 30.0–36.0)
MCV: 95.1 fL (ref 78.0–100.0)
Platelets: 194 10*3/uL (ref 150–400)
RBC: 4.69 MIL/uL (ref 4.22–5.81)
RDW: 13 % (ref 11.5–15.5)
WBC: 7.7 10*3/uL (ref 4.0–10.5)

## 2014-11-17 LAB — ABO/RH: ABO/RH(D): A POS

## 2014-11-17 LAB — APTT: aPTT: 39 seconds — ABNORMAL HIGH (ref 24–37)

## 2014-11-17 MED ORDER — CHLORHEXIDINE GLUCONATE 4 % EX LIQD: 60.0000 mL | Freq: Once | CUTANEOUS | Status: DC

## 2014-11-17 NOTE — Progress Notes (Signed)
Dr.Arida is cardiologist  EKG in epic from 11-10-14  States an echo was done several yrs ago  Stress test reports in epic from 2014/2015  Heart cath reports in epic from 2007/2008/2010  Medical Md is Dr.Barbara Astrid Divine  Denies CXR in past yr

## 2014-11-17 NOTE — Pre-Procedure Instructions (Signed)
[facility]  11/17/2014   Your procedure is scheduled on:  Fri, Dec 18 @ 8:00 AM  Report to [facility]  at 6:00 AM.  Call this number if you have problems the morning of surgery: [phone]   Remember:   Do not eat food or drink liquids after midnight.   Take these medicines the morning of surgery with A SIP OF WATER: Xanax(Alprazolam),Amlodipine(Norvasc),Wellbutrin(Bupropion),Depakote(Divalproex),Pain Pill(if needed),Lithium(Eskalith),and Pantoprazole(Protonix)               No Goody's,BC's,Aleve,Ibuprofen,Fish Oil,or any Herbal Medications                  Do not wear jewelry.  Do not wear lotions, powders, or colognes. You may wear deodorant.  Men may shave face and neck.  Do not bring valuables to the hospital.  [facility] is not responsible                  for any belongings or valuables.               Contacts, dentures or bridgework may not be worn into surgery.  Leave suitcase in the car. After surgery it may be brought to your room.  For patients admitted to the hospital, discharge time is determined by your                treatment team.               Patients discharged the day of surgery will not be allowed to drive  home.    Special Instructions:  Welcome - Preparing for Surgery  Before surgery, you can play an important role.  Because skin is not sterile, your skin needs to be as free of germs as possible.  You can reduce the number of germs on you skin by washing with CHG (chlorahexidine gluconate) soap before surgery.  CHG is an antiseptic cleaner which kills germs and bonds with the skin to continue killing germs even after washing.  Please DO NOT use if you have an allergy to CHG or antibacterial soaps.  If your skin becomes reddened/irritated stop using the CHG and inform your nurse when you arrive at Short Stay.  Do not shave (including legs and underarms) for at least 48 hours prior to the first CHG shower.  You may shave your  face.  Please follow these instructions carefully:   1.  Shower with CHG Soap the night before surgery and the                                morning of Surgery.  2.  If you choose to wash your hair, wash your hair first as usual with your       normal shampoo.  3.  After you shampoo, rinse your hair and body thoroughly to remove the                      Shampoo.  4.  Use CHG as you would any other liquid soap.  You can apply chg directly       to the skin and wash gently with scrungie or a clean washcloth.  5.  Apply the CHG Soap to your body ONLY FROM THE NECK DOWN.        Do not use on open wounds or open sores.  Avoid contact with your eyes,  ears, mouth and genitals (private parts).  Wash genitals (private parts)       with your normal soap.  6.  Wash thoroughly, paying special attention to the area where your surgery        will be performed.  7.  Thoroughly rinse your body with warm water from the neck down.  8.  DO NOT shower/wash with your normal soap after using and rinsing off       the CHG Soap.  9.  Pat yourself dry with a clean towel.            10.  Wear clean pajamas.            11.  Place clean sheets on your bed the night of your first shower and do not        sleep with pets.  Day of Surgery  Do not apply any lotions/deoderants the morning of surgery.  Please wear clean clothes to the hospital/surgery center.     Please read over the following fact sheets that you were given: Pain Booklet, Coughing and Deep Breathing, Blood Transfusion Information, MRSA Information and Surgical Site Infection Prevention

## 2014-11-19 MED ORDER — DEXTROSE 5 % IV SOLN
1.5000 g | INTRAVENOUS | Status: AC
  Administered 2014-11-20: 1.5 g via INTRAVENOUS
  Filled 2014-11-19: qty 1.5

## 2014-11-20 ENCOUNTER — Inpatient Hospital Stay (HOSPITAL_COMMUNITY): Payer: Medicaid Other

## 2014-11-20 ENCOUNTER — Inpatient Hospital Stay (HOSPITAL_COMMUNITY)
Admission: RE | Admit: 2014-11-20 | Discharge: 2014-12-02 | DRG: 268 | Disposition: A | Discharge status: Home Health | Payer: Medicaid Other | Source: Ambulatory Visit | Attending: Vascular Surgery | Admitting: Vascular Surgery

## 2014-11-20 ENCOUNTER — Encounter (HOSPITAL_COMMUNITY): Payer: Self-pay | Admitting: *Deleted

## 2014-11-20 ENCOUNTER — Ambulatory Visit (HOSPITAL_COMMUNITY): Payer: Medicaid Other | Admitting: Anesthesiology

## 2014-11-20 ENCOUNTER — Encounter (HOSPITAL_COMMUNITY)
Admission: RE | Disposition: A | Discharge status: Home Health | Payer: Self-pay | Source: Ambulatory Visit | Attending: Vascular Surgery

## 2014-11-20 DIAGNOSIS — E785 Hyperlipidemia, unspecified: Secondary | ICD-10-CM | POA: Diagnosis present

## 2014-11-20 DIAGNOSIS — D62 Acute posthemorrhagic anemia: Secondary | ICD-10-CM | POA: Diagnosis not present

## 2014-11-20 DIAGNOSIS — J9601 Acute respiratory failure with hypoxia: Secondary | ICD-10-CM

## 2014-11-20 DIAGNOSIS — J9600 Acute respiratory failure, unspecified whether with hypoxia or hypercapnia: Secondary | ICD-10-CM

## 2014-11-20 DIAGNOSIS — F1010 Alcohol abuse, uncomplicated: Secondary | ICD-10-CM

## 2014-11-20 DIAGNOSIS — F10231 Alcohol dependence with withdrawal delirium: Secondary | ICD-10-CM | POA: Diagnosis not present

## 2014-11-20 DIAGNOSIS — M79605 Pain in left leg: Secondary | ICD-10-CM | POA: Diagnosis present

## 2014-11-20 DIAGNOSIS — I7409 Other arterial embolism and thrombosis of abdominal aorta: Secondary | ICD-10-CM | POA: Diagnosis present

## 2014-11-20 DIAGNOSIS — Z955 Presence of coronary angioplasty implant and graft: Secondary | ICD-10-CM | POA: Diagnosis not present

## 2014-11-20 DIAGNOSIS — R066 Hiccough: Secondary | ICD-10-CM | POA: Diagnosis not present

## 2014-11-20 DIAGNOSIS — I70212 Atherosclerosis of native arteries of extremities with intermittent claudication, left leg: Secondary | ICD-10-CM | POA: Diagnosis present

## 2014-11-20 DIAGNOSIS — F10931 Alcohol use, unspecified with withdrawal delirium: Secondary | ICD-10-CM

## 2014-11-20 DIAGNOSIS — R0989 Other specified symptoms and signs involving the circulatory and respiratory systems: Secondary | ICD-10-CM

## 2014-11-20 DIAGNOSIS — I252 Old myocardial infarction: Secondary | ICD-10-CM

## 2014-11-20 DIAGNOSIS — F19931 Other psychoactive substance use, unspecified with withdrawal delirium: Secondary | ICD-10-CM | POA: Diagnosis not present

## 2014-11-20 DIAGNOSIS — Z9119 Patient's noncompliance with other medical treatment and regimen: Secondary | ICD-10-CM | POA: Diagnosis present

## 2014-11-20 DIAGNOSIS — I2510 Atherosclerotic heart disease of native coronary artery without angina pectoris: Secondary | ICD-10-CM | POA: Diagnosis present

## 2014-11-20 DIAGNOSIS — R4182 Altered mental status, unspecified: Secondary | ICD-10-CM

## 2014-11-20 DIAGNOSIS — I10 Essential (primary) hypertension: Secondary | ICD-10-CM | POA: Diagnosis present

## 2014-11-20 DIAGNOSIS — Z7982 Long term (current) use of aspirin: Secondary | ICD-10-CM

## 2014-11-20 DIAGNOSIS — F209 Schizophrenia, unspecified: Secondary | ICD-10-CM | POA: Diagnosis present

## 2014-11-20 DIAGNOSIS — F319 Bipolar disorder, unspecified: Secondary | ICD-10-CM | POA: Diagnosis present

## 2014-11-20 DIAGNOSIS — E876 Hypokalemia: Secondary | ICD-10-CM | POA: Diagnosis not present

## 2014-11-20 DIAGNOSIS — K219 Gastro-esophageal reflux disease without esophagitis: Secondary | ICD-10-CM | POA: Diagnosis present

## 2014-11-20 DIAGNOSIS — Y909 Presence of alcohol in blood, level not specified: Secondary | ICD-10-CM | POA: Diagnosis present

## 2014-11-20 DIAGNOSIS — R001 Bradycardia, unspecified: Secondary | ICD-10-CM | POA: Diagnosis not present

## 2014-11-20 DIAGNOSIS — Z9889 Other specified postprocedural states: Secondary | ICD-10-CM

## 2014-11-20 DIAGNOSIS — F17200 Nicotine dependence, unspecified, uncomplicated: Secondary | ICD-10-CM | POA: Diagnosis present

## 2014-11-20 DIAGNOSIS — Z7902 Long term (current) use of antithrombotics/antiplatelets: Secondary | ICD-10-CM

## 2014-11-20 DIAGNOSIS — Z419 Encounter for procedure for purposes other than remedying health state, unspecified: Secondary | ICD-10-CM

## 2014-11-20 DIAGNOSIS — F17210 Nicotine dependence, cigarettes, uncomplicated: Secondary | ICD-10-CM | POA: Diagnosis present

## 2014-11-20 DIAGNOSIS — F3170 Bipolar disorder, currently in remission, most recent episode unspecified: Secondary | ICD-10-CM | POA: Diagnosis present

## 2014-11-20 DIAGNOSIS — F10239 Alcohol dependence with withdrawal, unspecified: Secondary | ICD-10-CM | POA: Diagnosis present

## 2014-11-20 DIAGNOSIS — J9690 Respiratory failure, unspecified, unspecified whether with hypoxia or hypercapnia: Secondary | ICD-10-CM

## 2014-11-20 DIAGNOSIS — I745 Embolism and thrombosis of iliac artery: Secondary | ICD-10-CM | POA: Diagnosis not present

## 2014-11-20 DIAGNOSIS — I658 Occlusion and stenosis of other precerebral arteries: Secondary | ICD-10-CM | POA: Diagnosis present

## 2014-11-20 DIAGNOSIS — R339 Retention of urine, unspecified: Secondary | ICD-10-CM | POA: Diagnosis not present

## 2014-11-20 HISTORY — PX: AORTA - BILATERAL FEMORAL ARTERY BYPASS GRAFT: SHX1175

## 2014-11-20 LAB — BASIC METABOLIC PANEL
Anion gap: 7 (ref 5–15)
BUN: 9 mg/dL (ref 6–23)
CO2: 26 mEq/L (ref 19–32)
Calcium: 8.4 mg/dL (ref 8.4–10.5)
Chloride: 102 mEq/L (ref 96–112)
Creatinine, Ser: 0.85 mg/dL (ref 0.50–1.35)
GFR calc Af Amer: >90 mL/min (ref >90)
GFR calc non Af Amer: >90 mL/min (ref >90)
Glucose, Bld: 118 mg/dL — ABNORMAL HIGH (ref 70–99)
Potassium: 4.9 mEq/L (ref 3.7–5.3)
Sodium: 135 mEq/L — ABNORMAL LOW (ref 137–147)

## 2014-11-20 LAB — POCT I-STAT 7, (LYTES, BLD GAS, ICA,H+H)
Acid-Base Excess: 2 mmol/L (ref 0.0–2.0)
Bicarbonate: 24.8 mEq/L — ABNORMAL HIGH (ref 20.0–24.0)
Calcium, Ion: 1.11 mmol/L — ABNORMAL LOW (ref 1.12–1.23)
HCT: 34 % — ABNORMAL LOW (ref 39.0–52.0)
Hemoglobin: 11.6 g/dL — ABNORMAL LOW (ref 13.0–17.0)
O2 Saturation: 100 %
Patient temperature: 36.3
Potassium: 4.3 mEq/L (ref 3.7–5.3)
Sodium: 136 mEq/L — ABNORMAL LOW (ref 137–147)
TCO2: 26 mmol/L (ref 0–100)
pCO2 arterial: 32.8 mmHg — ABNORMAL LOW (ref 35.0–45.0)
pH, Arterial: 7.485 — ABNORMAL HIGH (ref 7.350–7.450)
pO2, Arterial: 259 mmHg — ABNORMAL HIGH (ref 80.0–100.0)

## 2014-11-20 LAB — BLOOD GAS, ARTERIAL
Acid-Base Excess: 0.8 mmol/L (ref 0.0–2.0)
Bicarbonate: 26 mEq/L — ABNORMAL HIGH (ref 20.0–24.0)
FIO2: 0.21 %
O2 Saturation: 97.9 %
Patient temperature: 98.6
TCO2: 27.5 mmol/L (ref 0–100)
pCO2 arterial: 49.9 mmHg — ABNORMAL HIGH (ref 35.0–45.0)
pH, Arterial: 7.336 — ABNORMAL LOW (ref 7.350–7.450)
pO2, Arterial: 107 mmHg — ABNORMAL HIGH (ref 80.0–100.0)

## 2014-11-20 LAB — CBC
HCT: 35.1 % — ABNORMAL LOW (ref 39.0–52.0)
Hemoglobin: 11.5 g/dL — ABNORMAL LOW (ref 13.0–17.0)
MCH: 31.6 pg (ref 26.0–34.0)
MCHC: 32.8 g/dL (ref 30.0–36.0)
MCV: 96.4 fL (ref 78.0–100.0)
Platelets: 152 10*3/uL (ref 150–400)
RBC: 3.64 MIL/uL — ABNORMAL LOW (ref 4.22–5.81)
RDW: 12.9 % (ref 11.5–15.5)
WBC: 12.7 10*3/uL — ABNORMAL HIGH (ref 4.0–10.5)

## 2014-11-20 LAB — PROTIME-INR
INR: 1.23 (ref 0.00–1.49)
Prothrombin Time: 15.7 seconds — ABNORMAL HIGH (ref 11.6–15.2)

## 2014-11-20 LAB — PREPARE RBC (CROSSMATCH)

## 2014-11-20 LAB — APTT: aPTT: 28 seconds (ref 24–37)

## 2014-11-20 LAB — MAGNESIUM: Magnesium: 2.1 mg/dL (ref 1.5–2.5)

## 2014-11-20 SURGERY — CREATION, BYPASS, ARTERIAL, AORTA TO FEMORAL, BILATERAL, USING GRAFT
Anesthesia: General

## 2014-11-20 MED ORDER — PROPOFOL 10 MG/ML IV BOLUS
INTRAVENOUS | Status: AC
  Filled 2014-11-20: qty 20

## 2014-11-20 MED ORDER — METOPROLOL TARTRATE 1 MG/ML IV SOLN: 2.0000 mg | INTRAVENOUS | Status: DC | PRN

## 2014-11-20 MED ORDER — ROCURONIUM BROMIDE 50 MG/5ML IV SOLN
INTRAVENOUS | Status: AC
  Filled 2014-11-20: qty 1

## 2014-11-20 MED ORDER — HYDRALAZINE HCL 20 MG/ML IJ SOLN
5.0000 mg | INTRAMUSCULAR | Status: DC | PRN
Start: 2014-11-20 — End: 2014-12-02

## 2014-11-20 MED ORDER — FENTANYL CITRATE 0.05 MG/ML IJ SOLN
INTRAMUSCULAR | Status: AC
  Filled 2014-11-20: qty 5

## 2014-11-20 MED ORDER — GUAIFENESIN-DM 100-10 MG/5ML PO SYRP: 15.0000 mL | ORAL_SOLUTION | ORAL | Status: DC | PRN

## 2014-11-20 MED ORDER — METOPROLOL TARTRATE 1 MG/ML IV SOLN
2.5000 mg | Freq: Four times a day (QID) | INTRAVENOUS | Status: DC
  Administered 2014-11-21 – 2014-11-23 (×10): 2.5 mg via INTRAVENOUS
  Filled 2014-11-20 (×20): qty 5

## 2014-11-20 MED ORDER — KCL IN DEXTROSE-NACL 20-5-0.9 MEQ/L-%-% IV SOLN
INTRAVENOUS | Status: DC
  Administered 2014-11-20: 100 mL/h via INTRAVENOUS
  Administered 2014-11-21: 22:00:00 via INTRAVENOUS
  Administered 2014-11-21: 100 mL/h via INTRAVENOUS
  Administered 2014-11-21 – 2014-11-24 (×3): via INTRAVENOUS
  Filled 2014-11-20 (×12): qty 1000

## 2014-11-20 MED ORDER — ONDANSETRON HCL 4 MG/2ML IJ SOLN
4.0000 mg | Freq: Four times a day (QID) | INTRAMUSCULAR | Status: DC | PRN
  Administered 2014-11-20 – 2014-12-01 (×4): 4 mg via INTRAVENOUS
  Filled 2014-11-20 (×4): qty 2

## 2014-11-20 MED ORDER — MIDAZOLAM HCL 2 MG/2ML IJ SOLN
INTRAMUSCULAR | Status: AC
  Filled 2014-11-20: qty 2

## 2014-11-20 MED ORDER — PROTAMINE SULFATE 10 MG/ML IV SOLN
INTRAVENOUS | Status: DC | PRN
  Administered 2014-11-20: 100 mg via INTRAVENOUS

## 2014-11-20 MED ORDER — ENOXAPARIN SODIUM 30 MG/0.3ML ~~LOC~~ SOLN
30.0000 mg | SUBCUTANEOUS | Status: DC
  Administered 2014-11-21 – 2014-11-28 (×7): 30 mg via SUBCUTANEOUS
  Filled 2014-11-20 (×9): qty 0.3

## 2014-11-20 MED ORDER — SODIUM CHLORIDE 0.9 % IV SOLN: INTRAVENOUS | Status: DC

## 2014-11-20 MED ORDER — NEOSTIGMINE METHYLSULFATE 10 MG/10ML IV SOLN
INTRAVENOUS | Status: DC | PRN
  Administered 2014-11-20: 4 mg via INTRAVENOUS

## 2014-11-20 MED ORDER — MEPERIDINE HCL 25 MG/ML IJ SOLN: 6.2500 mg | INTRAMUSCULAR | Status: DC | PRN

## 2014-11-20 MED ORDER — PANTOPRAZOLE SODIUM 40 MG IV SOLR
40.0000 mg | Freq: Every day | INTRAVENOUS | Status: DC
  Administered 2014-11-20: 40 mg via INTRAVENOUS
  Filled 2014-11-20 (×2): qty 40

## 2014-11-20 MED ORDER — HYDROMORPHONE HCL 1 MG/ML IJ SOLN
0.2500 mg | INTRAMUSCULAR | Status: DC | PRN
  Administered 2014-11-20: 0.5 mg via INTRAVENOUS

## 2014-11-20 MED ORDER — PHENYLEPHRINE HCL 10 MG/ML IJ SOLN
10.0000 mg | INTRAVENOUS | Status: DC | PRN
  Administered 2014-11-20: 25 ug/min via INTRAVENOUS

## 2014-11-20 MED ORDER — HEPARIN SODIUM (PORCINE) 1000 UNIT/ML IJ SOLN
INTRAMUSCULAR | Status: AC
  Filled 2014-11-20: qty 1

## 2014-11-20 MED ORDER — HYDROMORPHONE HCL 1 MG/ML IJ SOLN
INTRAMUSCULAR | Status: AC
  Filled 2014-11-20: qty 1

## 2014-11-20 MED ORDER — LACTATED RINGERS IV SOLN
INTRAVENOUS | Status: DC | PRN
  Administered 2014-11-20 (×2): via INTRAVENOUS

## 2014-11-20 MED ORDER — HEPARIN SODIUM (PORCINE) 1000 UNIT/ML IJ SOLN
INTRAMUSCULAR | Status: DC | PRN
  Administered 2014-11-20: 6000 [IU] via INTRAVENOUS
  Administered 2014-11-20: 2000 [IU] via INTRAVENOUS

## 2014-11-20 MED ORDER — ONDANSETRON HCL 4 MG/2ML IJ SOLN
INTRAMUSCULAR | Status: DC | PRN
  Administered 2014-11-20: 4 mg via INTRAVENOUS

## 2014-11-20 MED ORDER — MAGNESIUM SULFATE 2 GM/50ML IV SOLN: 2.0000 g | Freq: Once | INTRAVENOUS | Status: AC | PRN

## 2014-11-20 MED ORDER — ONDANSETRON HCL 4 MG/2ML IJ SOLN
INTRAMUSCULAR | Status: AC
  Filled 2014-11-20: qty 2

## 2014-11-20 MED ORDER — LIDOCAINE HCL (CARDIAC) 20 MG/ML IV SOLN
INTRAVENOUS | Status: AC
Start: 2014-11-20 — End: 2014-11-20
  Filled 2014-11-20: qty 5

## 2014-11-20 MED ORDER — METOPROLOL TARTRATE 1 MG/ML IV SOLN: 5.0000 mg | Freq: Four times a day (QID) | INTRAVENOUS | Status: DC

## 2014-11-20 MED ORDER — GLYCOPYRROLATE 0.2 MG/ML IJ SOLN
INTRAMUSCULAR | Status: AC
  Filled 2014-11-20: qty 3

## 2014-11-20 MED ORDER — NEOSTIGMINE METHYLSULFATE 10 MG/10ML IV SOLN
INTRAVENOUS | Status: AC
  Filled 2014-11-20: qty 1

## 2014-11-20 MED ORDER — SODIUM CHLORIDE 0.9 % IV SOLN
Freq: Once | INTRAVENOUS | Status: DC
Start: 2014-11-20 — End: 2014-11-20

## 2014-11-20 MED ORDER — SODIUM CHLORIDE 0.9 % IV SOLN
500.0000 mL | Freq: Once | INTRAVENOUS | Status: AC | PRN
  Administered 2014-11-20 (×2): 500 mL via INTRAVENOUS

## 2014-11-20 MED ORDER — ACETAMINOPHEN 650 MG RE SUPP
325.0000 mg | RECTAL | Status: DC | PRN
  Administered 2014-11-24 – 2014-11-28 (×5): 650 mg via RECTAL
  Filled 2014-11-20 (×5): qty 1

## 2014-11-20 MED ORDER — PROMETHAZINE HCL 25 MG/ML IJ SOLN: 6.2500 mg | INTRAMUSCULAR | Status: DC | PRN

## 2014-11-20 MED ORDER — LABETALOL HCL 5 MG/ML IV SOLN
10.0000 mg | INTRAVENOUS | Status: DC | PRN
Start: 2014-11-20 — End: 2014-12-02
  Filled 2014-11-20: qty 4

## 2014-11-20 MED ORDER — SODIUM CHLORIDE 0.9 % IJ SOLN
INTRAMUSCULAR | Status: AC
  Filled 2014-11-20: qty 10

## 2014-11-20 MED ORDER — FENTANYL CITRATE 0.05 MG/ML IJ SOLN
INTRAMUSCULAR | Status: DC | PRN
  Administered 2014-11-20 (×2): 50 ug via INTRAVENOUS
  Administered 2014-11-20 (×3): 100 ug via INTRAVENOUS
  Administered 2014-11-20: 50 ug via INTRAVENOUS
  Administered 2014-11-20 (×3): 100 ug via INTRAVENOUS

## 2014-11-20 MED ORDER — LORAZEPAM 2 MG/ML IJ SOLN
1.0000 mg | Freq: Four times a day (QID) | INTRAMUSCULAR | Status: DC | PRN
  Administered 2014-11-21: 1 mg via INTRAVENOUS
  Filled 2014-11-20: qty 1

## 2014-11-20 MED ORDER — ALUM & MAG HYDROXIDE-SIMETH 200-200-20 MG/5ML PO SUSP: 15.0000 mL | ORAL | Status: DC | PRN

## 2014-11-20 MED ORDER — MIDAZOLAM HCL 5 MG/5ML IJ SOLN
INTRAMUSCULAR | Status: DC | PRN
  Administered 2014-11-20 (×2): 1 mg via INTRAVENOUS
  Administered 2014-11-20: 2 mg via INTRAVENOUS

## 2014-11-20 MED ORDER — SODIUM CHLORIDE 0.9 % IV BOLUS (SEPSIS)
1000.0000 mL | Freq: Once | INTRAVENOUS | Status: AC
  Administered 2014-11-20: 1000 mL via INTRAVENOUS

## 2014-11-20 MED ORDER — LIDOCAINE HCL (CARDIAC) 20 MG/ML IV SOLN
INTRAVENOUS | Status: AC
  Filled 2014-11-20: qty 5

## 2014-11-20 MED ORDER — ALBUMIN HUMAN 5 % IV SOLN
INTRAVENOUS | Status: DC | PRN
  Administered 2014-11-20 (×5): via INTRAVENOUS

## 2014-11-20 MED ORDER — EPHEDRINE SULFATE 50 MG/ML IJ SOLN
INTRAMUSCULAR | Status: AC
  Filled 2014-11-20: qty 1

## 2014-11-20 MED ORDER — LACTATED RINGERS IV SOLN
INTRAVENOUS | Status: DC | PRN
  Administered 2014-11-20: 07:00:00 via INTRAVENOUS

## 2014-11-20 MED ORDER — PHENOL 1.4 % MT LIQD: 1.0000 | OROMUCOSAL | Status: DC | PRN

## 2014-11-20 MED ORDER — PROPOFOL 10 MG/ML IV BOLUS
INTRAVENOUS | Status: DC | PRN
  Administered 2014-11-20: 100 mg via INTRAVENOUS

## 2014-11-20 MED ORDER — DEXTROSE 5 % IV SOLN
1.5000 g | Freq: Two times a day (BID) | INTRAVENOUS | Status: AC
  Administered 2014-11-20 – 2014-11-21 (×2): 1.5 g via INTRAVENOUS
  Filled 2014-11-20 (×2): qty 1.5

## 2014-11-20 MED ORDER — EPHEDRINE SULFATE 50 MG/ML IJ SOLN
INTRAMUSCULAR | Status: DC | PRN
  Administered 2014-11-20 (×2): 10 mg via INTRAVENOUS

## 2014-11-20 MED ORDER — PANTOPRAZOLE SODIUM 40 MG PO TBEC
40.0000 mg | DELAYED_RELEASE_TABLET | Freq: Every day | ORAL | Status: DC
Start: 2014-11-20 — End: 2014-11-21
  Filled 2014-11-20: qty 1

## 2014-11-20 MED ORDER — SODIUM CHLORIDE 0.9 % IR SOLN
Status: DC | PRN
  Administered 2014-11-20: 08:00:00

## 2014-11-20 MED ORDER — FENTANYL CITRATE 0.05 MG/ML IJ SOLN
INTRAMUSCULAR | Status: AC
Start: 2014-11-20 — End: 2014-11-20
  Filled 2014-11-20: qty 5

## 2014-11-20 MED ORDER — ROCURONIUM BROMIDE 100 MG/10ML IV SOLN
INTRAVENOUS | Status: DC | PRN
  Administered 2014-11-20 (×3): 10 mg via INTRAVENOUS
  Administered 2014-11-20: 20 mg via INTRAVENOUS
  Administered 2014-11-20 (×3): 10 mg via INTRAVENOUS
  Administered 2014-11-20: 50 mg via INTRAVENOUS
  Administered 2014-11-20: 10 mg via INTRAVENOUS

## 2014-11-20 MED ORDER — DOCUSATE SODIUM 100 MG PO CAPS
100.0000 mg | ORAL_CAPSULE | Freq: Every day | ORAL | Status: DC
  Administered 2014-11-21 – 2014-11-23 (×3): 100 mg via ORAL
  Filled 2014-11-20 (×3): qty 1

## 2014-11-20 MED ORDER — POTASSIUM CHLORIDE CRYS ER 20 MEQ PO TBCR: 20.0000 meq | EXTENDED_RELEASE_TABLET | Freq: Once | ORAL | Status: AC | PRN

## 2014-11-20 MED ORDER — GLYCOPYRROLATE 0.2 MG/ML IJ SOLN
INTRAMUSCULAR | Status: DC | PRN
  Administered 2014-11-20: 0.6 mg via INTRAVENOUS

## 2014-11-20 MED ORDER — ACETAMINOPHEN 325 MG PO TABS
325.0000 mg | ORAL_TABLET | ORAL | Status: DC | PRN
  Administered 2014-12-01 – 2014-12-02 (×2): 650 mg via ORAL
  Filled 2014-11-20 (×2): qty 2

## 2014-11-20 MED ORDER — 0.9 % SODIUM CHLORIDE (POUR BTL) OPTIME
TOPICAL | Status: DC | PRN
  Administered 2014-11-20: 3000 mL

## 2014-11-20 MED ORDER — MORPHINE SULFATE 2 MG/ML IJ SOLN
2.0000 mg | INTRAMUSCULAR | Status: DC | PRN
  Administered 2014-11-20: 2 mg via INTRAVENOUS
  Administered 2014-11-20: 4 mg via INTRAVENOUS
  Administered 2014-11-20: 2 mg via INTRAVENOUS
  Administered 2014-11-20: 4 mg via INTRAVENOUS
  Administered 2014-11-21 – 2014-11-24 (×8): 2 mg via INTRAVENOUS
  Administered 2014-11-26: 4 mg via INTRAVENOUS
  Filled 2014-11-20: qty 2
  Filled 2014-11-20: qty 1
  Filled 2014-11-20: qty 2
  Filled 2014-11-20 (×9): qty 1
  Filled 2014-11-20: qty 2
  Filled 2014-11-20: qty 1

## 2014-11-20 MED ORDER — PHENYLEPHRINE 40 MCG/ML (10ML) SYRINGE FOR IV PUSH (FOR BLOOD PRESSURE SUPPORT)
PREFILLED_SYRINGE | INTRAVENOUS | Status: AC
  Filled 2014-11-20: qty 10

## 2014-11-20 SURGICAL SUPPLY — 61 items
ATTRACTOMAT 16X20 MAGNETIC DRP (DRAPES) ×3 IMPLANT
CANISTER SUCTION 2500CC (MISCELLANEOUS) ×3 IMPLANT
CLIP TI MEDIUM 24 (CLIP) ×3 IMPLANT
CLIP TI WIDE RED SMALL 24 (CLIP) ×3 IMPLANT
COVER SURGICAL LIGHT HANDLE (MISCELLANEOUS) ×3 IMPLANT
DRAPE PROXIMA HALF (DRAPES) ×4 IMPLANT
DRSG COVADERM 4X14 (GAUZE/BANDAGES/DRESSINGS) ×2 IMPLANT
ELECT REM PT RETURN 9FT ADLT (ELECTROSURGICAL) ×3
ELECTRODE REM PT RTRN 9FT ADLT (ELECTROSURGICAL) ×1 IMPLANT
FELT TEFLON 4 X1 (Mesh General) ×4 IMPLANT
GLOVE BIO SURGEON STRL SZ 6.5 (GLOVE) ×2 IMPLANT
GLOVE BIO SURGEONS STRL SZ 6.5 (GLOVE) ×2
GLOVE BIOGEL PI IND STRL 6.5 (GLOVE) IMPLANT
GLOVE BIOGEL PI IND STRL 7.0 (GLOVE) IMPLANT
GLOVE BIOGEL PI INDICATOR 6.5 (GLOVE) ×8
GLOVE BIOGEL PI INDICATOR 7.0 (GLOVE) ×4
GLOVE ECLIPSE 6.5 STRL STRAW (GLOVE) ×4 IMPLANT
GLOVE SS BIOGEL STRL SZ 7 (GLOVE) ×1 IMPLANT
GLOVE SUPERSENSE BIOGEL SZ 7 (GLOVE) ×6
GOWN STRL REUS W/ TWL LRG LVL3 (GOWN DISPOSABLE) ×3 IMPLANT
GOWN STRL REUS W/TWL LRG LVL3 (GOWN DISPOSABLE) ×15
GRAFT HEMASHIELD 14X8MM (Vascular Products) ×2 IMPLANT
INSERT FOGARTY 61MM (MISCELLANEOUS) ×3 IMPLANT
INSERT FOGARTY SM (MISCELLANEOUS) ×8 IMPLANT
KIT BASIN OR (CUSTOM PROCEDURE TRAY) ×3 IMPLANT
KIT ROOM TURNOVER OR (KITS) ×3 IMPLANT
LIQUID BAND (GAUZE/BANDAGES/DRESSINGS) ×4 IMPLANT
LOOP VESSEL MAXI BLUE (MISCELLANEOUS) ×2 IMPLANT
NS IRRIG 1000ML POUR BTL (IV SOLUTION) ×8 IMPLANT
PACK AORTA (CUSTOM PROCEDURE TRAY) ×3 IMPLANT
PAD ARMBOARD 7.5X6 YLW CONV (MISCELLANEOUS) ×6 IMPLANT
PROBE PENCIL 8 MHZ STRL DISP (MISCELLANEOUS) ×2 IMPLANT
SPONGE LAP 4X18 X RAY DECT (DISPOSABLE) ×3 IMPLANT
STAPLER VISISTAT 35W (STAPLE) ×6 IMPLANT
SUT PROLENE 1 XLH 60 (SUTURE) ×6 IMPLANT
SUT PROLENE 3 0 SH DA (SUTURE) ×4 IMPLANT
SUT PROLENE 3 0 SH1 36 (SUTURE) ×5 IMPLANT
SUT PROLENE 4 0 RB 1 (SUTURE) ×3
SUT PROLENE 4-0 RB1 .5 CRCL 36 (SUTURE) IMPLANT
SUT PROLENE 5 0 C 1 24 (SUTURE) ×4 IMPLANT
SUT PROLENE 5 0 C1 (SUTURE) ×4 IMPLANT
SUT PROLENE 6 0 BV (SUTURE) ×2 IMPLANT
SUT PROLENE 6 0 C 1 24 (SUTURE) ×8 IMPLANT
SUT PROLENE 6 0 C 1 30 (SUTURE) ×9 IMPLANT
SUT PROLENE 6 0 CC (SUTURE) ×2 IMPLANT
SUT SILK 2 0 (SUTURE) ×3
SUT SILK 2 0 SH CR/8 (SUTURE) ×3 IMPLANT
SUT SILK 2 0 TIES 17X18 (SUTURE) ×3
SUT SILK 2-0 18XBRD TIE 12 (SUTURE) IMPLANT
SUT SILK 2-0 18XBRD TIE BLK (SUTURE) ×1 IMPLANT
SUT SILK 3 0 (SUTURE) ×3
SUT SILK 3 0 TIES 17X18 (SUTURE) ×3
SUT SILK 3-0 18XBRD TIE 12 (SUTURE) IMPLANT
SUT SILK 3-0 18XBRD TIE BLK (SUTURE) ×1 IMPLANT
SUT VIC AB 2-0 CTX 36 (SUTURE) ×4 IMPLANT
SUT VIC AB 3-0 MH 27 (SUTURE) ×8 IMPLANT
SUT VIC AB 3-0 SH 27 (SUTURE) ×6
SUT VIC AB 3-0 SH 27X BRD (SUTURE) IMPLANT
SUT VICRYL 4-0 PS2 18IN ABS (SUTURE) ×2 IMPLANT
TRAY FOLEY CATH 16FRSI W/METER (SET/KITS/TRAYS/PACK) ×3 IMPLANT
WATER STERILE IRR 1000ML POUR (IV SOLUTION) ×6 IMPLANT

## 2014-11-20 NOTE — Progress Notes (Signed)
Utilization Review Completed.[name] T12/18/2015  

## 2014-11-20 NOTE — Op Note (Signed)
OPERATIVE REPORT  Date of Surgery: 11/20/2014  Surgeon: Tinnie Gens, MD  Assistant: [name] PA  Pre-op Diagnosis: Aortoiliac Occlusive Disease with Claudication I74.0  I74.5  I70.212  Post-op Diagnosis: Aortoiliac Occlusive Disease with Claudication I74.0  I74.5  I70.212  Procedure: Procedure(s): AORTA BIFEMORAL BYPASS GRAFT using 14 x 8 mm Hemashield Dacron graft  Anesthesia: General  EBL: 295 cc  Complications: None  The patient was taken to the operating room placed in supine position at which time satisfactory general endotracheal anesthesia was administered. Radial arterial line and a central venous catheter were placed by anesthesia preoperativel  Abdomen and groins were prepped Betadine scrub and solution draped in routine sterile manner. Short longitudinal incisions were made over both inguinal areas carried down through subcutaneous tissue common superficial and profunda femoris arteries dissected free. There was a 2-3+ pulse palpable on the right side absent pulse on the left. There was diffuse posterior plaque formation throughout the common femoral arteries in the superficial femoral arteries were patent on the angiogram but were diffusely diseased. Midline incision was made from below the xiphoid to just below the umbilicus carried after subtendinous tissue and linea alba. Perineal cavity was entered and thoroughly explored. The stomach duodenal small bowel and colon are unremarkable. Liver was smooth no palpable masses. Gallbladder appears normal no stones were palpable. Transverse colon was elevated intestines reflected the right side retroperitoneum incised exposing the aorta from the renal arteries to the bifurcation. There was diffuse calcific plaque formation which became more severe distally in both common iliac arteries had stents throughout which are easily palpable as well as calcified plaque. Circumflex will controlled aorta was obtained just distal the renal  arteries patient is given 25 g mannitol and heparinized. Aorta was occluded distal to the renal arteries transected distally and oversewn with 2 layers of 3-0 Prolene buttressing this with 2 strips of felt. Tension then turned to the proximal aortic neck small amount of thrombus was removed which was chronic but otherwise was widely patent. A 14 x 8 mm Hemashield Dacron graft was anastomosed end in the aortic stump using continuous 3-0 Prolene buttressing this with a strip of felt. This was checked for leaks and none were present. Limbs were delivered through intraperitoneal tunnels posterior to the ureters. On the right side femoral vessels were occluded with faster clamps longitudinal opening made in the common femoral 15 blade extended with Potts scissors down to the very distal aspect. As noted there was a posterior plaque throughout as well as disease in the superficial femoral artery. Graft was fashioned appropriately and sewn end to side the common femoral artery 5-0 Prolene. Following appropriate flushing is is completed clamps released nose good pulse and Doppler flow in the superficial femoral and profunda. Identical procedure was performed on the left side and decide anastomosis from graft to common femoral artery. There was good backbleeding on all vessels on the left and following completion of the anastomosis appropriate flushing clamps released nose good pulse and good Doppler flow in the superficial femoral and profunda femoris arteries on the left side as well. Protamine given to reverse the heparin following adequate hemostasis the groin wounds were closed in layers with Vicryl subcuticular fashion with Dermabond. Attention turned to the abdomen which was thoroughly irrigated with saline retroperitoneum approximated 3-0 Vicryl fascia with #1 Prolene skin with staples sterile dressing applied patient taken to recovery room in stable condition extubated. He received no bank blood. He did receive 150  cc of blood from  the Cell Saver. Urinary output was stable hemodynamically throughout the case.     Tinnie Gens, MD 11/20/2014 11:35 AM

## 2014-11-20 NOTE — Interval H&P Note (Signed)
History and Physical Interval Note:  11/20/2014 7:19 AM  [name]  has presented today for surgery, with the diagnosis of Aortoiliac Occlusive Disease with Claudication I74.0  I74.5  I70.212  The various methods of treatment have been discussed with the patient and family. After consideration of risks, benefits and other options for treatment, the patient has consented to  Procedure(s): AORTA BIFEMORAL BYPASS GRAFT (N/A) as a surgical intervention .  The patient's history has been reviewed, patient examined, no change in status, stable for surgery.  I have reviewed the patient's chart and labs.  Questions were answered to the patient's satisfaction.     Tinnie Gens

## 2014-11-20 NOTE — Anesthesia Preprocedure Evaluation (Addendum)
Anesthesia Evaluation  Patient identified by MRN, date of birth, ID band Patient awake    Reviewed: Allergy & Precautions, H&P , NPO status , Patient's Chart, lab work & pertinent test results  Airway Mallampati: II  TM Distance: >3 FB Neck ROM: Full    Dental no notable dental hx.    Pulmonary shortness of breath, Current Smoker,  breath sounds clear to auscultation  Pulmonary exam normal       Cardiovascular hypertension, Pt. on medications + CAD, + Past MI and + Peripheral Vascular Disease Rhythm:Regular Rate:Normal     Neuro/Psych Seizures -,  PSYCHIATRIC DISORDERS Anxiety Depression Bipolar Disorder Schizophrenia    GI/Hepatic GERD-  ,(+) Hepatitis -, C  Endo/Other  negative endocrine ROS  Renal/GU negative Renal ROS     Musculoskeletal negative musculoskeletal ROS (+)   Abdominal   Peds  Hematology negative hematology ROS (+)   Anesthesia Other Findings   Reproductive/Obstetrics                             Anesthesia Physical Anesthesia Plan  ASA: III  Anesthesia Plan: General   Post-op Pain Management:    Induction: Intravenous  Airway Management Planned: Oral ETT  Additional Equipment: Arterial line  Intra-op Plan:   Post-operative Plan: Extubation in OR  Informed Consent: I have reviewed the patients History and Physical, chart, labs and discussed the procedure including the risks, benefits and alternatives for the proposed anesthesia with the patient or authorized representative who has indicated his/her understanding and acceptance.   Dental advisory given  Plan Discussed with: CRNA  Anesthesia Plan Comments:         Anesthesia Quick Evaluation

## 2014-11-20 NOTE — Transfer of Care (Signed)
Immediate Anesthesia Transfer of Care Note  Patient: [name]  Procedure(s) Performed: Procedure(s): AORTA BIFEMORAL BYPASS GRAFT (N/A)  Patient Location: PACU  Anesthesia Type:General  Level of Consciousness: awake, alert , oriented and patient cooperative  Airway & Oxygen Therapy: Patient Spontanous Breathing and Patient connected to face mask oxygen  Post-op Assessment: Report given to PACU RN, Post -op Vital signs reviewed and stable and Patient moving all extremities  Post vital signs: Reviewed and stable  Complications: No apparent anesthesia complications

## 2014-11-20 NOTE — Progress Notes (Signed)
  Day of Surgery Note    Subjective:  C/o soreness  Filed Vitals:   11/20/14 1300  BP: 109/68  Pulse: 88  Temp: 97.8 F (36.6 C)  Resp: 16    Incisions:   Bilateral groins are c/d/i; midline incision is covered and dressing is clean. Extremities:  2+ palpable right DP/PT; + audible doppler signals left PT/DP Cardiac:  regular Lungs:  Non labored Abdomen:  Soft, decreased BS  Assessment/Plan:  This is a 53 y.o. male who is s/p aortobifemoral bypass grafting  -pt doing well this afternoon with palpable pulses on the right foot and + audible doppler signals on the left -continue NPO -psych meds hopefully to be restarted tomorrow -ativan IV is ordered prn   [name], PA-C 11/20/2014 2:07 PM

## 2014-11-20 NOTE — H&P (View-Only) (Signed)
Vascular Surgery H&P  Chief Complaint: Severe pain in left leg with ambulation  HPI: [name] is a 53 y.o. male who presents for evaluation of severe pain in left leg with ambulation. Patient was evaluated by Dr. Jacqulyn Cane for a 3 day history of acute onset of pain in the left leg. He is limited to walking about 25-30 feet before he develops aching discomfort. He has a history of multiple stents in both iliac arteries the most recent was about 2 years ago. Angiogram was performed by Dr. Fletcher Anon who found that the entire left iliac system was thrombosed in the right iliac system was widely patent. Patient has been noncompliant with his Plavix therapy. He continues to smoke one half pack of cigarettes per day and has smoked for 40+ years up to 2 packs per day. He denies rest pain in the foot or numbness in the foot but is unable to ambulate greater than 50 feet. He is evaluated for possible aortobifemoral bypass grafting.   Past Medical History  Diagnosis Date  . Hypertension   . Hyperlipidemia   . Myocardial infarction 1996; 1997; 2000's    "total of 3" (10/22/2012)  . Hepatitis C     "diagnosed in the past 2 wk" (10/22/2012)  . Seizures     "when I get anxious" (10/22/2012)  . Anxiety   . Depression   . Bipolar affective   . Schizophrenia   . PAD (peripheral artery disease)     2010 PTA & stent to left CIA & left SFA; 05/2012 PTA & stent to distal LCIA; 10/2012 thrombectomy and stents to [place]  . Renal artery stenosis     mild to moderate renal artery stenosis on the left  . History of ETOH abuse     quit 2007  . Tobacco abuse   . Broken fingers   . Coronary artery disease     s/p PCI to RCA at [facility]; s/p PCI to RCA '08; Cath 05/2012 showed known occluded LCx, RCA stent w/ mild restenosis, LAD no significant dz, EF 35% . Cardiac cath in July of 2014 showed no significant change.    Past Surgical History  Procedure Laterality Date  . Subdural hematoma  evacuation via craniotomy    . Anterior cervical decomp/discectomy fusion  01/2011    plate & screw placement   . Iliac artery stent  10/22/2012    "2" (10/22/2012)  . Brain surgery    . Posterior fusion cervical spine  2012  . Liver biopsy  10/2012  . Cardiac catheterization      Stent - 12  . Coronary angioplasty with stent placement  03/2007    to RCA  . Iliac artery stent  06/2012    left   History   Social History  . Marital Status: Divorced    Spouse Name: N/A    Number of Children: N/A  . Years of Education: N/A   Occupational History  . Disabled    Social History Main Topics  . Smoking status: Current Every Day Smoker -- 1.00 packs/day for 40 years    Types: Cigarettes  . Smokeless tobacco: Never Used  . Alcohol Use: No     Comment: 10/22/2012 "last drink was 5-7 yr ago; used to have a problem w/it"  . Drug Use: No  . Sexual Activity: Yes   Other Topics Concern  . Not on file   Social History Narrative   17 years Bipolar/ADHD.  Pt gets regular exercise.   Family History  Problem Relation Age of Onset  . Emphysema Father   . Diabetes Other   . Alcohol abuse Other   . Hypertension Other   . Hyperlipidemia Other   . Seizures Other    No Known Allergies Prior to Admission medications   Medication Sig Start Date End Date Taking? Authorizing Provider  ABILIFY 20 MG tablet Take 20 mg by mouth at bedtime. 10/30/14   Historical Provider, MD  alprazolam Duanne Moron) 2 MG tablet Take 2 mg by mouth 3 (three) times daily as needed for sleep or anxiety.     Historical Provider, MD  amLODipine (NORVASC) 5 MG tablet TAKE 1 TABLET BY MOUTH EVERY DAY    Wellington Hampshire, MD  aspirin 81 MG EC tablet Take 81 mg by mouth daily.      Historical Provider, MD  atorvastatin (LIPITOR) 10 MG tablet Take 1 tablet (10 mg total) by mouth daily. Patient not taking: Reported on 11/10/2014 06/28/12 11/10/14  Minna Merritts, MD  buPROPion (WELLBUTRIN XL) 300 MG 24 hr tablet Take 300 mg by  mouth daily.    Historical Provider, MD  clopidogrel (PLAVIX) 75 MG tablet TAKE 1 TABLET (75 MG TOTAL) BY MOUTH DAILY. 11/10/14   Wellington Hampshire, MD  divalproex (DEPAKOTE) 500 MG EC tablet Take 500 mg by mouth 3 (three) times daily.     Historical Provider, MD  lisdexamfetamine (VYVANSE) 70 MG capsule Take 70 mg by mouth every morning.    Historical Provider, MD  lisinopril (PRINIVIL,ZESTRIL) 2.5 MG tablet Take 2.5 mg by mouth daily.    Historical Provider, MD  lithium carbonate (ESKALITH) 450 MG CR tablet Take 450 mg by mouth 2 (two) times daily. 11/01/14   Historical Provider, MD  pantoprazole (PROTONIX) 40 MG tablet Take 1 tablet (40 mg total) by mouth daily. 01/19/12   Minna Merritts, MD     Positive ROS: Has occasional mild chest tightness which she has had for several years. Most recent nuclear stress test revealed EF of about 40%. Cardiac cath revealed total occlusion of his right coronary artery. Denies dyspnea on exertion, PND, orthopnea, hemoptysis, lateralizing weakness, aphasia, amaurosis fugax, diplopia, syncope. Has history of anterior cervical spine surgery.  All other systems have been reviewed and were otherwise negative with the exception of those mentioned in the HPI and as above.  Physical Exam: There were no vitals filed for this visit.  General: Alert, no acute distress HEENT: Normal for age Cardiovascular: Regular rate and rhythm. Carotid pulses 2+, no bruits audible Respiratory: Clear to auscultation. No cyanosis, no use of accessory musculature GI: No organomegaly, abdomen is soft and non-tender Skin: No lesions in the area of chief complaint Neurologic: Sensation intact distally Psychiatric: Patient is competent for consent with normal mood and affect Musculoskeletal: No obvious deformities Extremities: Right leg with 3+ femoral popliteal dorsalis pedis and posterior tibial pulses palpable. Left leg with absent femoral and distal pulses. Motion and sensation  intact left foot with no calf tenderness.    Imaging reviewed: Angiograms reviewed and reveal widely patent aorta and stents to right iliac system are patent at this time. Total occlusion of left iliac system flush at aortic bifurcation down to external iliac artery level with reconstitution left common femoral artery and good runoff left leg   Assessment/Plan:  Discussed situation with Dr.Arida and we both feel that the best plan would be aortobifemoral bypass graft in this 53 year old male 2 decrease the  likelihood that he will need further procedures on his aortoiliac system in the future. Plan nuclear stress test over next few days and if that is stable and acceptable plan aortobifemoral bypass grafting on Friday, December 18. Recent benefits thoroughly discussed with patient and possibility of intraoperative cardiac event discussed and he would like proceed. He will discontinue his Plavix after Thursday of this week-tomorrow.   Tinnie Gens, MD 11/11/2014 2:42 PM

## 2014-11-20 NOTE — Anesthesia Postprocedure Evaluation (Signed)
Anesthesia Post Note  Patient: [name]  Procedure(s) Performed: Procedure(s) (LRB): AORTA BIFEMORAL BYPASS GRAFT (N/A)  Anesthesia type: General  Patient location: PACU  Post pain: Pain level controlled  Post assessment: Post-op Vital signs reviewed  Last Vitals: BP 93/46 mmHg  Pulse 89  Temp(Src) 36.5 C (Oral)  Resp 11  Ht 5\' 7"  (1.702 m)  Wt 156 lb (70.761 kg)  BMI 24.43 kg/m2  SpO2 97%  Post vital signs: Reviewed  Level of consciousness: sedated  Complications: No apparent anesthesia complications

## 2014-11-21 ENCOUNTER — Inpatient Hospital Stay (HOSPITAL_COMMUNITY): Payer: Medicaid Other

## 2014-11-21 LAB — COMPREHENSIVE METABOLIC PANEL
ALT: 7 U/L (ref 0–53)
AST: 17 U/L (ref 0–37)
Albumin: 3.2 g/dL — ABNORMAL LOW (ref 3.5–5.2)
Alkaline Phosphatase: 31 U/L — ABNORMAL LOW (ref 39–117)
Anion gap: 9 (ref 5–15)
BUN: 9 mg/dL (ref 6–23)
CO2: 22 mEq/L (ref 19–32)
Calcium: 8 mg/dL — ABNORMAL LOW (ref 8.4–10.5)
Chloride: 107 mEq/L (ref 96–112)
Creatinine, Ser: 0.86 mg/dL (ref 0.50–1.35)
GFR calc Af Amer: >90 mL/min (ref >90)
GFR calc non Af Amer: >90 mL/min (ref >90)
Glucose, Bld: 130 mg/dL — ABNORMAL HIGH (ref 70–99)
Potassium: 4.8 mEq/L (ref 3.7–5.3)
Sodium: 138 mEq/L (ref 137–147)
Total Bilirubin: 0.8 mg/dL (ref 0.3–1.2)
Total Protein: 5.4 g/dL — ABNORMAL LOW (ref 6.0–8.3)

## 2014-11-21 LAB — CBC
HCT: 32.8 % — ABNORMAL LOW (ref 39.0–52.0)
Hemoglobin: 10.9 g/dL — ABNORMAL LOW (ref 13.0–17.0)
MCH: 32.7 pg (ref 26.0–34.0)
MCHC: 33.2 g/dL (ref 30.0–36.0)
MCV: 98.5 fL (ref 78.0–100.0)
Platelets: 139 10*3/uL — ABNORMAL LOW (ref 150–400)
RBC: 3.33 MIL/uL — ABNORMAL LOW (ref 4.22–5.81)
RDW: 12.9 % (ref 11.5–15.5)
WBC: 9.7 10*3/uL (ref 4.0–10.5)

## 2014-11-21 LAB — POCT I-STAT 3, ART BLOOD GAS (G3+)
Acid-base deficit: 4 mmol/L — ABNORMAL HIGH (ref 0.0–2.0)
Bicarbonate: 19.6 mEq/L — ABNORMAL LOW (ref 20.0–24.0)
O2 Saturation: 98 %
Patient temperature: 97.8
TCO2: 20 mmol/L (ref 0–100)
pCO2 arterial: 28.3 mmHg — ABNORMAL LOW (ref 35.0–45.0)
pH, Arterial: 7.446 (ref 7.350–7.450)
pO2, Arterial: 94 mmHg (ref 80.0–100.0)

## 2014-11-21 LAB — MAGNESIUM: Magnesium: 2.2 mg/dL (ref 1.5–2.5)

## 2014-11-21 LAB — AMYLASE: Amylase: 86 U/L (ref 0–105)

## 2014-11-21 MED ORDER — PANTOPRAZOLE SODIUM 40 MG IV SOLR
40.0000 mg | Freq: Every day | INTRAVENOUS | Status: DC
  Administered 2014-11-21 – 2014-11-25 (×3): 40 mg via INTRAVENOUS
  Filled 2014-11-21 (×5): qty 40

## 2014-11-21 MED ORDER — DIVALPROEX SODIUM 500 MG PO DR TAB
500.0000 mg | DELAYED_RELEASE_TABLET | Freq: Three times a day (TID) | ORAL | Status: DC
  Administered 2014-11-21 – 2014-11-23 (×8): 500 mg via ORAL
  Filled 2014-11-21 (×15): qty 1

## 2014-11-21 MED ORDER — LORAZEPAM 2 MG/ML IJ SOLN
1.0000 mg | INTRAMUSCULAR | Status: DC | PRN
  Administered 2014-11-21 – 2014-11-23 (×8): 1 mg via INTRAVENOUS
  Filled 2014-11-21 (×9): qty 1

## 2014-11-21 MED ORDER — ARIPIPRAZOLE 10 MG PO TABS
20.0000 mg | ORAL_TABLET | Freq: Every day | ORAL | Status: DC
  Administered 2014-11-21 – 2014-11-25 (×4): 20 mg via ORAL
  Filled 2014-11-21 (×6): qty 2

## 2014-11-21 MED ORDER — PANTOPRAZOLE SODIUM 40 MG PO TBEC
40.0000 mg | DELAYED_RELEASE_TABLET | Freq: Every day | ORAL | Status: DC
  Administered 2014-11-21: 40 mg via ORAL

## 2014-11-21 MED ORDER — KETOROLAC TROMETHAMINE 30 MG/ML IJ SOLN
30.0000 mg | Freq: Four times a day (QID) | INTRAMUSCULAR | Status: AC
  Administered 2014-11-21 – 2014-11-23 (×10): 30 mg via INTRAVENOUS
  Filled 2014-11-21 (×13): qty 1

## 2014-11-21 MED ORDER — LITHIUM CARBONATE ER 450 MG PO TBCR
450.0000 mg | EXTENDED_RELEASE_TABLET | Freq: Two times a day (BID) | ORAL | Status: DC
  Administered 2014-11-21 – 2014-11-23 (×6): 450 mg via ORAL
  Filled 2014-11-21 (×10): qty 1

## 2014-11-21 MED ORDER — OXYCODONE-ACETAMINOPHEN 5-325 MG PO TABS
1.0000 | ORAL_TABLET | ORAL | Status: DC | PRN
  Administered 2014-11-21: 1 via ORAL
  Filled 2014-11-21: qty 1

## 2014-11-21 MED ORDER — ASPIRIN 81 MG PO CHEW
81.0000 mg | CHEWABLE_TABLET | Freq: Every day | ORAL | Status: DC
  Administered 2014-11-21 – 2014-11-22 (×2): 81 mg via ORAL
  Filled 2014-11-21 (×2): qty 1

## 2014-11-21 MED ORDER — AMLODIPINE BESYLATE 5 MG PO TABS
5.0000 mg | ORAL_TABLET | Freq: Every day | ORAL | Status: DC
  Administered 2014-11-21 – 2014-11-23 (×3): 5 mg via ORAL
  Filled 2014-11-21 (×5): qty 1

## 2014-11-21 MED ORDER — ALPRAZOLAM 0.5 MG PO TABS
2.0000 mg | ORAL_TABLET | Freq: Three times a day (TID) | ORAL | Status: DC | PRN
  Administered 2014-11-21 – 2014-11-22 (×3): 2 mg via ORAL
  Filled 2014-11-21 (×3): qty 4

## 2014-11-21 MED ORDER — PANTOPRAZOLE SODIUM 40 MG PO TBEC
40.0000 mg | DELAYED_RELEASE_TABLET | Freq: Every day | ORAL | Status: DC
  Administered 2014-11-23: 40 mg via ORAL
  Filled 2014-11-21: qty 1

## 2014-11-21 MED ORDER — BUPROPION HCL ER (XL) 300 MG PO TB24
300.0000 mg | ORAL_TABLET | Freq: Every day | ORAL | Status: DC
  Administered 2014-11-21 – 2014-11-23 (×3): 300 mg via ORAL
  Filled 2014-11-21 (×5): qty 1

## 2014-11-21 MED ORDER — LISINOPRIL 2.5 MG PO TABS
2.5000 mg | ORAL_TABLET | Freq: Every day | ORAL | Status: DC
  Administered 2014-11-21 – 2014-11-23 (×3): 2.5 mg via ORAL
  Filled 2014-11-21 (×5): qty 1

## 2014-11-21 MED ORDER — LISDEXAMFETAMINE DIMESYLATE 70 MG PO CAPS
70.0000 mg | ORAL_CAPSULE | ORAL | Status: DC
  Administered 2014-11-21 – 2014-11-23 (×3): 70 mg via ORAL
  Filled 2014-11-21 (×3): qty 1

## 2014-11-21 NOTE — Progress Notes (Addendum)
    Subjective  - POD #1, s/p ABF  C/o pain Required 2 L of IVF for decreased BP  Physical Exam:  Incision ok palpable pulses       Assessment/Plan:  POD #1  GI:  100 residual from NG. Will d/c and start PO home meds with small sips.  Keep NPO GU:  Cr normal Pain:  Start Toradol x 3 days Dispo:  Keep in ICU.  D/c a-line.  OB to chair CV:  Required 2L IV bolus yesterday Acute blood loss anemia:  H/H stable today.  Got cell saver yesterday Lovenox to start today for DVT prophylaxis   Beni Turrell IV, V. WELLS 11/21/2014 7:56 AM --  Filed Vitals:   11/21/14 0700  BP: 95/59  Pulse: 79  Temp:   Resp: 19    Intake/Output Summary (Last 24 hours) at 11/21/14 0756 Last data filed at 11/21/14 0600  Gross per 24 hour  Intake   4960 ml  Output   3265 ml  Net   1695 ml     Laboratory CBC    Component Value Date/Time   WBC 9.7 11/21/2014 0430   WBC CANCELED 11/10/2014 1424   HGB 10.9* 11/21/2014 0430   HCT 32.8* 11/21/2014 0430   PLT 139* 11/21/2014 0430    BMET    Component Value Date/Time   NA 138 11/21/2014 0430   NA 137 11/10/2014 1424   K 4.8 11/21/2014 0430   CL 107 11/21/2014 0430   CO2 22 11/21/2014 0430   GLUCOSE 130* 11/21/2014 0430   GLUCOSE 97 11/10/2014 1424   BUN 9 11/21/2014 0430   BUN 12 11/10/2014 1424   CREATININE 0.86 11/21/2014 0430   CALCIUM 8.0* 11/21/2014 0430   GFRNONAA >90 11/21/2014 0430   GFRAA >90 11/21/2014 0430    COAG Lab Results  Component Value Date   INR 1.23 11/20/2014   INR 1.04 11/17/2014   INR 1.1 11/10/2014   No results found for: PTT  Antibiotics Anti-infectives    Start     Dose/Rate Route Frequency Ordered Stop   11/20/14 1930  cefUROXime (ZINACEF) 1.5 g in dextrose 5 % 50 mL IVPB     1.5 g100 mL/hr over 30 Minutes Intravenous Every 12 hours 11/20/14 1354 11/21/14 1929   11/19/14 1400  cefUROXime (ZINACEF) 1.5 g in dextrose 5 % 50 mL IVPB     1.5 g100 mL/hr over 30 Minutes Intravenous 30 min pre-op  11/19/14 1400 11/20/14 0800       V. Leia Alf, M.D. [facility] [facility] Office: [phone] Pager:  [phone]

## 2014-11-21 NOTE — Evaluation (Signed)
Physical Therapy Evaluation Patient Details Name: [name] MRN: [id number] DOB: [birth year] Today's Date: 11/21/2014   History of Present Illness  53 y.o. male s/p AORTA BIFEMORAL BYPASS GRAFT .  Clinical Impression  Patient is seen following the above procedure and presents with functional limitations due to the deficits listed below (see PT Problem List). Ambulates slowly with min assist up to 150 feet today while pushing wheelchair for upper extremity support. Requires min assist for transfers. SpO2 maintained 95% on room air. He was independent with occasional use of a cane prior to admission. Spoke with patient about rehab before returning home. May progress well enough to return home with HHPT as patient progresses medically; will continue to follow and update recommendations as indicated. Patient will benefit from skilled PT to increase their independence and safety with mobility to allow discharge to the venue listed below.       Follow Up Recommendations CIR    Equipment Recommendations  Rolling walker with 5" wheels;3in1 (PT)    Recommendations for Other Services OT consult;Rehab consult     Precautions / Restrictions Precautions Precautions: None Restrictions Weight Bearing Restrictions: No      Mobility  Bed Mobility                  Transfers Overall transfer level: Needs assistance Equipment used: Pushed w/c Transfers: Sit to/from Stand Sit to Stand: Min assist;+2 safety/equipment         General transfer comment: Min assist for stability. performed from reclining chair, second person available for safety. VC for technnique and hand placement.  Ambulation/Gait Ambulation/Gait assistance: Min assist;+2 safety/equipment Ambulation Distance (Feet): 150 Feet Assistive device:  (Pushed wheelchair) Gait Pattern/deviations: Step-through pattern;Decreased stride length;Trunk flexed Gait velocity: decreased Gait velocity interpretation: Below  normal speed for age/gender General Gait Details: Very slow and guarded gait. Pushing wheelchair for stability, required min assist for steering. VC and education focusing on increased stride length with upright posture.  Stairs            Wheelchair Mobility    Modified Rankin (Stroke Patients Only)       Balance Overall balance assessment: Needs assistance Sitting-balance support: No upper extremity supported;Feet supported Sitting balance-Leahy Scale: Good     Standing balance support: No upper extremity supported Standing balance-Leahy Scale: Fair                               Pertinent Vitals/Pain Pain Assessment: 0-10 Pain Score: 4  Pain Location: abdomen Pain Descriptors / Indicators: Throbbing Pain Intervention(s): Limited activity within patient's tolerance;Repositioned  Sitting: BP 111/58 SpO2 95% HR 80  Standing:  107/64 SpO2 95% HR 86  Ambulating HR to 100 bpm SpO2 95%     Home Living Family/patient expects to be discharged to:: Private residence Living Arrangements: Non-relatives/Friends Available Help at Discharge: Friend(s) (lives with friend who is not home 24/7) Type of Home: House Home Access: Stairs to enter Entrance Stairs-Rails: None Technical brewer of Steps: 3 Home Layout: One level Home Equipment: Cane - single point      Prior Function Level of Independence: Independent with assistive device(s)         Comments: used cane when legs hurt for ambulation     Hand Dominance   Dominant Hand: Left    Extremity/Trunk Assessment   Upper Extremity Assessment: Defer to OT evaluation           Lower Extremity  Assessment: Generalized weakness         Communication   Communication: No difficulties  Cognition Arousal/Alertness: Awake/alert Behavior During Therapy: Flat affect Overall Cognitive Status: Within Functional Limits for tasks assessed                      General Comments  General comments (skin integrity, edema, etc.): Tremors while ambulating and performing exercises. No occurances of knees buckling.    Exercises General Exercises - Lower Extremity Ankle Circles/Pumps: AROM;Both;10 reps;Seated Quad Sets: Strengthening;Both;10 reps;Seated Gluteal Sets: Strengthening;Both;10 reps;Seated Long Arc Quad: Strengthening;Both;10 reps;Seated Hip Flexion/Marching: Strengthening;Both;10 reps;Seated      Assessment/Plan    PT Assessment Patient needs continued PT services  PT Diagnosis Difficulty walking;Abnormality of gait;Generalized weakness;Acute pain   PT Problem List Decreased strength;Decreased range of motion;Decreased activity tolerance;Decreased balance;Decreased mobility;Decreased knowledge of use of DME;Pain  PT Treatment Interventions DME instruction;Gait training;Stair training;Functional mobility training;Therapeutic activities;Therapeutic exercise;Balance training;Neuromuscular re-education;Patient/family education   PT Goals (Current goals can be found in the Care Plan section) Acute Rehab PT Goals Patient Stated Goal: Go home PT Goal Formulation: With patient Time For Goal Achievement: 12/05/14 Potential to Achieve Goals: Good    Frequency Min 3X/week   Barriers to discharge Decreased caregiver support Roommate works while patient stays at home alone    Co-evaluation               End of Session   Activity Tolerance: Patient tolerated treatment well Patient left: in chair;with call bell/phone within reach Nurse Communication: Mobility status         Time: 0998-3382 PT Time Calculation (min) (ACUTE ONLY): 25 min   Charges:   PT Evaluation $Initial PT Evaluation Tier I: 1 Procedure PT Treatments $Gait Training: 8-22 mins   PT G CodesEllouise Newer 11/21/2014, 3:40 PM  Camille Bal [place], [place]

## 2014-11-21 NOTE — Progress Notes (Signed)
Pts. SBP was still low 80's-90'  Cuff pressure and at times a-line pressure.  At times it correlates and positional with the a-line pressure. Dr. Trula Slade made aware with orders made. 1 liter of NS bolus was given SBP >100. Will continue to monitor.

## 2014-11-22 LAB — GLUCOSE, CAPILLARY
Glucose-Capillary: 102 mg/dL — ABNORMAL HIGH (ref 70–99)
Glucose-Capillary: 89 mg/dL (ref 70–99)
Glucose-Capillary: 99 mg/dL (ref 70–99)

## 2014-11-22 LAB — BASIC METABOLIC PANEL WITH GFR
Anion gap: 7 (ref 5–15)
BUN: 7 mg/dL (ref 6–23)
CO2: 24 meq/L (ref 19–32)
Calcium: 8 mg/dL — ABNORMAL LOW (ref 8.4–10.5)
Chloride: 108 meq/L (ref 96–112)
Creatinine, Ser: 0.84 mg/dL (ref 0.50–1.35)
GFR calc Af Amer: >90 mL/min
GFR calc non Af Amer: >90 mL/min
Glucose, Bld: 100 mg/dL — ABNORMAL HIGH (ref 70–99)
Potassium: 4.2 meq/L (ref 3.7–5.3)
Sodium: 139 meq/L (ref 137–147)

## 2014-11-22 MED ORDER — ASPIRIN 81 MG PO CHEW
81.0000 mg | CHEWABLE_TABLET | Freq: Every day | ORAL | Status: DC
  Administered 2014-11-23 – 2014-12-02 (×9): 81 mg via ORAL
  Filled 2014-11-22 (×9): qty 1

## 2014-11-22 NOTE — Progress Notes (Signed)
    Subjective  - POD #2  Still c/o pain No flatus Started on CIWA protocol yesterday as patient told nurses he has a history of withdrawal   Physical Exam:  Abd soft Palpable pulses Incisions ok       Assessment/Plan:  POD #2  GI:  Ice chips only until flatus Pain:  Getting ativan with CIWA.  Does not call out to nurses for more pain meds.  Appears comfortable.  Continue toradol CV:  BP stable today Acute blood loss anemia:  Hct down today will recheck in am D/c foley With ETOH withdrawal issues, will keep in ICU/step down one more day  BRABHAM IV, V. WELLS 11/22/2014 5:11 AM --  Filed Vitals:   11/22/14 0500  BP: 94/49  Pulse: 72  Temp:   Resp: 24    Intake/Output Summary (Last 24 hours) at 11/22/14 0511 Last data filed at 11/22/14 0500  Gross per 24 hour  Intake   2420 ml  Output   1630 ml  Net    790 ml     Laboratory CBC    Component Value Date/Time   WBC 6.9 11/22/2014 0327   WBC CANCELED 11/10/2014 1424   HGB 8.4* 11/22/2014 0327   HCT 25.4* 11/22/2014 0327   PLT PENDING 11/22/2014 0327    BMET    Component Value Date/Time   NA 139 11/22/2014 0327   NA 137 11/10/2014 1424   K 4.2 11/22/2014 0327   CL 108 11/22/2014 0327   CO2 24 11/22/2014 0327   GLUCOSE 100* 11/22/2014 0327   GLUCOSE 97 11/10/2014 1424   BUN 7 11/22/2014 0327   BUN 12 11/10/2014 1424   CREATININE 0.84 11/22/2014 0327   CALCIUM 8.0* 11/22/2014 0327   GFRNONAA >90 11/22/2014 0327   GFRAA >90 11/22/2014 0327    COAG Lab Results  Component Value Date   INR 1.23 11/20/2014   INR 1.04 11/17/2014   INR 1.1 11/10/2014   No results found for: PTT  Antibiotics Anti-infectives    Start     Dose/Rate Route Frequency Ordered Stop   11/20/14 1930  cefUROXime (ZINACEF) 1.5 g in dextrose 5 % 50 mL IVPB     1.5 g100 mL/hr over 30 Minutes Intravenous Every 12 hours 11/20/14 1354 11/21/14 0900   11/19/14 1400  cefUROXime (ZINACEF) 1.5 g in dextrose 5 % 50 mL IVPB     1.5 g100 mL/hr over 30 Minutes Intravenous 30 min pre-op 11/19/14 1400 11/20/14 0800       V. Leia Alf, M.D. [facility] Office: [phone] Pager:  [phone]

## 2014-11-23 ENCOUNTER — Other Ambulatory Visit: Payer: Self-pay

## 2014-11-23 DIAGNOSIS — R0789 Other chest pain: Secondary | ICD-10-CM

## 2014-11-23 LAB — BASIC METABOLIC PANEL
Anion gap: 10 (ref 5–15)
BUN: 4 mg/dL — ABNORMAL LOW (ref 6–23)
CO2: 22 mEq/L (ref 19–32)
Calcium: 8.6 mg/dL (ref 8.4–10.5)
Chloride: 106 mEq/L (ref 96–112)
Creatinine, Ser: 0.82 mg/dL (ref 0.50–1.35)
GFR calc Af Amer: >90 mL/min (ref >90)
GFR calc non Af Amer: >90 mL/min (ref >90)
Glucose, Bld: 81 mg/dL (ref 70–99)
Potassium: 4.1 mEq/L (ref 3.7–5.3)
Sodium: 138 mEq/L (ref 137–147)

## 2014-11-23 LAB — CBC
HCT: 25.4 % — ABNORMAL LOW (ref 39.0–52.0)
HCT: 26.9 % — ABNORMAL LOW (ref 39.0–52.0)
Hemoglobin: 8.4 g/dL — ABNORMAL LOW (ref 13.0–17.0)
Hemoglobin: 8.9 g/dL — ABNORMAL LOW (ref 13.0–17.0)
MCH: 32.2 pg (ref 26.0–34.0)
MCH: 31.4 pg (ref 26.0–34.0)
MCHC: 33.1 g/dL (ref 30.0–36.0)
MCHC: 33.1 g/dL (ref 30.0–36.0)
MCV: 97.3 fL (ref 78.0–100.0)
MCV: 95.1 fL (ref 78.0–100.0)
Platelets: 106 10*3/uL — ABNORMAL LOW (ref 150–400)
Platelets: 124 10*3/uL — ABNORMAL LOW (ref 150–400)
RBC: 2.61 MIL/uL — ABNORMAL LOW (ref 4.22–5.81)
RBC: 2.83 MIL/uL — ABNORMAL LOW (ref 4.22–5.81)
RDW: 12.9 % (ref 11.5–15.5)
RDW: 12.7 % (ref 11.5–15.5)
WBC: 6.9 10*3/uL (ref 4.0–10.5)
WBC: 6.8 10*3/uL (ref 4.0–10.5)

## 2014-11-23 MED ORDER — BISACODYL 10 MG RE SUPP
10.0000 mg | Freq: Once | RECTAL | Status: AC
  Administered 2014-11-23: 10 mg via RECTAL
  Filled 2014-11-23: qty 1

## 2014-11-23 MED ORDER — LORAZEPAM 2 MG/ML IJ SOLN
2.0000 mg | INTRAMUSCULAR | Status: DC | PRN
  Administered 2014-11-23 (×2): 2 mg via INTRAVENOUS
  Administered 2014-11-24: 1 mg via INTRAVENOUS
  Administered 2014-11-24 – 2014-11-26 (×3): 2 mg via INTRAVENOUS
  Filled 2014-11-23 (×6): qty 1

## 2014-11-23 NOTE — Progress Notes (Signed)
CARE MANAGEMENT NOTE 11/23/2014  Patient:  [name], [name]   Account Number:  [id number]  Date Initiated:  11/23/2014  Documentation initiated by:  [facility]  Subjective/Objective Assessment:   aortabifemoral bypass graft 12/18, CIWA prot     Action/Plan:   lives with friend, Burnis Kingfisher (Friend), [phone]   Anticipated DC Date:     Anticipated DC Plan:  [place]  In-house referral  Clinical Social Worker      DC Planning Services  CM consult      Choice offered to / List presented to:             Status of service:  In process, will continue to follow Medicare Important Message given?   (If response is "NO", the following Medicare IM given date fields will be blank) Date Medicare IM given:   Medicare IM given by:   Date Additional Medicare IM given:   Additional Medicare IM given by:    Discharge Disposition:    Per UR Regulation:  Reviewed for med. necessity/level of care/duration of stay  If discussed at [place] of Stay Meetings, dates discussed:    Comments:  11/23/2014 1000 NCM spoke to pt and states he prefers to go home. Lives with a friend. CSW referral for possible SNF placement. Unable to get clear answers at this time due to orientation. Jonnie Finner RN CCM Case Mgmt phone [phone]

## 2014-11-23 NOTE — Progress Notes (Signed)
Bladder scan preformed per Retention protocol. Scan showed 531ml. Per MD request foley was placed with yellow urine returned. Sterile procedure maintained throughout procedure. Meribeth Mattes RN as second person present during insertion. Pt tolerated procedure well. Will continue to monitor.

## 2014-11-23 NOTE — Progress Notes (Signed)
Pt increased confusion and agitation says he has to void assisted but patient unable, pt continues to claim he needs to void. Bladder scanned patient  It showed between 640 and 680 cc of urine.  Pt's foley was removed @ 1400 on 20 dec 15 so per protocol IN and OUT catheretization performed per policy and returned 737 cc of yellow urine pt tolerated well will continue to monitor.

## 2014-11-23 NOTE — Progress Notes (Signed)
Physical Therapy Treatment Patient Details Name: [name] MRN: [id number] DOB: September 05, 1961 Today's Date: 11/23/2014    History of Present Illness 53 y.o. male s/p AORTA BIFEMORAL BYPASS GRAFT .    PT Comments    Patient with decreased attention and difficulty following directions during therapy session today. Only able to ambulate up to 65 feet with max cues and min assist for walker control. Noted, patient is not eligible for CIR and therefore recommend SNF for continued therapy prior to returning home due to decreased safety with mobility. Patient will continue to benefit from skilled physical therapy services to further improve independence with functional mobility.   Follow Up Recommendations  SNF     Equipment Recommendations  Rolling walker with 5" wheels;3in1 (PT)    Recommendations for Other Services OT consult;Rehab consult     Precautions / Restrictions Precautions Precautions: None Restrictions Weight Bearing Restrictions: No    Mobility  Bed Mobility Overal bed mobility: Needs Assistance Bed Mobility: Supine to Sit     Supine to sit: Max assist     General bed mobility comments: assist with trunk and LE's. Cues for technique.  Transfers Overall transfer level: Needs assistance Equipment used: Rolling walker (2 wheeled) Transfers: Sit to/from Stand Sit to Stand: Min assist         General transfer comment: Min assist for boost to stand from reclining chair. Max cues for technique. requires extra time. Max cues to sit. Attempted to sit on armrest . Needed min assist to correctly sit into chair.  Ambulation/Gait Ambulation/Gait assistance: Min assist Ambulation Distance (Feet): 65 Feet Assistive device: Rolling walker (2 wheeled) (Pushed wheelchair) Gait Pattern/deviations: Step-through pattern;Decreased stride length;Shuffle;Drifts right/left;Narrow base of support;Trunk flexed Gait velocity: Very slow   General Gait Details: Ambulates very  slowly with max cues for sequencing and min assist for walker control frequently. Difficulty backing up and following all commands for ambulating.   Stairs            Wheelchair Mobility    Modified Rankin (Stroke Patients Only)       Balance                                    Cognition Arousal/Alertness: Awake/alert Behavior During Therapy: Flat affect;Impulsive Overall Cognitive Status: Impaired/Different from baseline Area of Impairment: Orientation;Problem solving;Following commands;Attention Orientation Level: Place;Time;Situation Current Attention Level: Sustained Memory: Decreased short-term memory Following Commands: Follows one step commands inconsistently;Follows one step commands with increased time     Problem Solving: Slow processing;Requires verbal cues;Decreased initiation;Requires tactile cues      Exercises General Exercises - Lower Extremity Ankle Circles/Pumps: Both;10 reps;Seated;AAROM Quad Sets: Strengthening;Both;Seated;5 reps Long Arc Quad: Strengthening;Both;Seated;AAROM;5 reps    General Comments        Pertinent Vitals/Pain Pain Assessment: No/denies pain  HR 99 -109 bpm SpO2 99-100% on room air.    Home Living Family/patient expects to be discharged to:: Private residence Living Arrangements: Non-relatives/Friends Available Help at Discharge: Friend(s) (lives with friend who is not home 24/7) Type of Home: House Home Access: Stairs to enter Entrance Stairs-Rails: None Home Layout: One level [place]: [place] - single point      Prior Function Level of Independence: Independent with assistive device(s)      Comments: used cane when legs hurt for ambulation   PT Goals (current goals can now be found in the care plan section) Acute Rehab PT Goals  Patient Stated Goal: not stated PT Goal Formulation: With patient Time For Goal Achievement: 12/05/14 Potential to Achieve Goals: Good Progress towards PT goals:  Progressing toward goals    Frequency  Min 3X/week    PT Plan Discharge plan needs to be updated    Co-evaluation             End of Session Equipment Utilized During Treatment: Gait belt Activity Tolerance: Other (comment) (limited by decreased cognition) Patient left: in chair;with call bell/phone within reach;with chair alarm set     Time: 1121-1144 PT Time Calculation (min) (ACUTE ONLY): 23 min  Charges:  $Gait Training: 8-22 mins $Therapeutic Activity: 8-22 mins                    G Codes:      Ellouise Newer 2014-12-12, 1:14 PM Elayne Snare, [place]

## 2014-11-23 NOTE — Progress Notes (Signed)
Pt,s agitation has increased pulling Telemetry leads off attempting to get OOB renistructed patient to call for help, placed safety mitten on patient for safety. Prn ativan dose given per MD order will continue to monitor

## 2014-11-23 NOTE — Progress Notes (Addendum)
Pt arrived from 2 south palpable pulses, alert to self and place only, very restless, tremors noted skin reddened Afebrile, bed alarm placed on pt call bell within reach.  CIWA 14

## 2014-11-23 NOTE — Evaluation (Addendum)
Occupational Therapy Evaluation Patient Details Name: DOMINYK LAW MRN: [id number] DOB: 11-Feb-1961 Today's Date: 11/23/2014    History of Present Illness 53 y.o. male s/p AORTA BIFEMORAL BYPASS GRAFT .   Clinical Impression   Pt s/p above. Pt independent with ADLs, PTA. Feel pt will benefit from acute OT to increase independence with BADLs, prior to d/c. Recommending SNF for rehab prior to d/c home.    Follow Up Recommendations  SNF;Supervision/Assistance - 24 hour    Equipment Recommendations  Other (comment) (defer to next venue)    Recommendations for Other Services       Precautions / Restrictions Precautions Precautions: Fall Restrictions Weight Bearing Restrictions: No      Mobility Bed Mobility Overal bed mobility: Needs Assistance Bed Mobility: Supine to Sit     Supine to sit: Max assist     General bed mobility comments: assist with trunk and LE's. Cues for technique.  Transfers Overall transfer level: Needs assistance Equipment used: Rolling walker (2 wheeled) Transfers: Sit to/from Stand Sit to Stand: Min guard         General transfer comment: cues for hand placement    Balance                                            ADL Overall ADL's : Needs assistance/impaired     Grooming: Wash/dry face;Standing;Moderate assistance       Lower Body Bathing: Maximal assistance;Sit to/from stand       Lower Body Dressing: Maximal assistance;Sit to/from stand   Toilet Transfer: Maximal assistance;Moderate assistance;+2 for safety/equipment;Ambulation;RW (chair/bed)   Toileting- Clothing Manipulation and Hygiene: Maximal assistance;Sit to/from stand       Functional mobility during ADLs: Moderate assistance;Maximal assistance;+2 for safety/equipment;Rolling walker General ADL Comments: Pt with apparent cognitive deficits. Ambulated to bathroom to perform ADLs. Assist with socks sitting EOB. Min A for balance when in  bathroom at sink.     Vision                     Perception     Praxis      Pertinent Vitals/Pain Pain Assessment: No/denies pain; RR in 30's. Cues for deep breathing and trended down.     Hand Dominance Left   Extremity/Trunk Assessment Upper Extremity Assessment Upper Extremity Assessment: RUE deficits/detail;LUE deficits/detail RUE Deficits / Details: tremors LUE Deficits / Details: tremors   Lower Extremity Assessment Lower Extremity Assessment: Defer to PT evaluation       Communication Communication Communication: No difficulties   Cognition Arousal/Alertness: Awake/alert Behavior During Therapy: Flat affect Overall Cognitive Status: Impaired/Different from baseline (however family not present to determine baseline) Area of Impairment: Orientation;Following commands;Problem solving;Attention;Memory Orientation Level: Disoriented to;Place;Time (initially able to state place, then at end could not) Current Attention Level: Sustained Memory: Decreased short-term memory Following Commands: Follows one step commands inconsistently;Follows one step commands with increased time     Problem Solving: Slow processing;Requires verbal cues;Decreased initiation;Requires tactile cues     General Comments       Exercises       Shoulder Instructions      Home Living Family/patient expects to be discharged to:: Private residence Living Arrangements: Non-relatives/Friends Available Help at Discharge: Friend(s) (lives with friend who is not home 24/7) Type of Home: House Home Access: Stairs to enter [employer] of Steps: 3 Entrance Stairs-Rails: None  Home Layout: One level     Bathroom Shower/Tub: Tub/shower unit         Home Equipment: Cane - single point          Prior Functioning/Environment Level of Independence: Independent with assistive device(s)        Comments: used cane when legs hurt for ambulation    OT Diagnosis:  Altered mental status   OT Problem List: Decreased activity tolerance;Impaired balance (sitting and/or standing);Decreased cognition;Decreased knowledge of use of DME or AE;Decreased knowledge of precautions;Decreased safety awareness;Decreased strength;Decreased coordination   OT Treatment/Interventions: Self-care/ADL training;DME and/or AE instruction;Therapeutic activities;Cognitive remediation/compensation;Patient/family education;Balance training    OT Goals(Current goals can be found in the care plan section) Acute Rehab OT Goals Patient Stated Goal: not stated OT Goal Formulation: With patient Time For Goal Achievement: 12/07/14 Potential to Achieve Goals: Good ADL Goals Pt Will Perform Grooming: with min guard assist;standing Pt Will Perform Lower Body Bathing: with min guard assist;sit to/from stand Pt Will Perform Lower Body Dressing: with min guard assist;sit to/from stand Pt Will Transfer to Toilet: with min guard assist;ambulating Pt Will Perform Toileting - Clothing Manipulation and hygiene: with min guard assist;sit to/from stand  OT Frequency: Min 2X/week   Barriers to D/C:            Co-evaluation              End of Session Equipment Utilized During Treatment: Gait belt;Rolling walker Nurse Communication: Other (comment) (assisted in session)  Activity Tolerance: Patient tolerated treatment well Patient left: in chair;with nursing/sitter in room   Time: 1000-1023 OT Time Calculation (min): 23 min Charges:  OT General Charges $OT Visit: 1 Procedure OT Evaluation $Initial OT Evaluation Tier I: 1 Procedure OT Treatments $Self Care/Home Management : 8-22 mins G-CodesBenito Mccreedy OTR/L [phone] 11/23/2014, 1:00 PM

## 2014-11-23 NOTE — Progress Notes (Signed)
Rehab Admissions Coordinator Note:  Patient was screened by Cleatrice Burke for appropriateness for an Inpatient Acute Rehab Consult per PT recommendation. At this time, we are recommending [facility] vs [facility] as he progresses. Inpt rehab  beds limited this week and pt may progress to go home at d/c.  Cleatrice Burke 11/23/2014, 8:55 AM  I can be reached at [phone].

## 2014-11-23 NOTE — Progress Notes (Addendum)
  Progress Note    11/23/2014 7:36 AM 3 Days Post-Op  Subjective:  Sleepy, but answers questions appropriately (pt was given ativan this morning at 0400).  Mild nausea that has improved.  No vomiting.  Tm 99.3 now afebrile HR 70's-110's NSR 144'R-154'M systolic 08% RA  Filed Vitals:   11/23/14 0345  BP:   Pulse:   Temp: 97.9 F (36.6 C)  Resp:     Physical Exam: Cardiac:  regular Lungs:  Non labored Incisions:  Bilateral groin incisions are c/d/i with some ecchymosis; midline incision is c/d/i with staples in tact. Extremities:  Bilateral feet are warm with bilateral palpable DP pulses. Abdomen:  Slightly distended but soft; occasional bowel sounds.  NT to palpation.  Denies flatus or BM.  CBC    Component Value Date/Time   WBC 6.8 11/23/2014 0400   WBC CANCELED 11/10/2014 1424   RBC 2.83* 11/23/2014 0400   RBC CANCELED 11/10/2014 1424   HGB 8.9* 11/23/2014 0400   HCT 26.9* 11/23/2014 0400   PLT 124* 11/23/2014 0400   MCV 95.1 11/23/2014 0400   MCH 31.4 11/23/2014 0400   MCH 34.6* 06/09/2013 1524   MCHC 33.1 11/23/2014 0400   MCHC 35.6 06/09/2013 1524   RDW 12.7 11/23/2014 0400   RDW 13.9 06/09/2013 1524   LYMPHSABS CANCELED 11/10/2014 1424   LYMPHSABS 1.8 06/01/2010 1522   MONOABS 0.6 06/01/2010 1522   EOSABS CANCELED 11/10/2014 1424   EOSABS 0.0 06/01/2010 1522   BASOSABS CANCELED 11/10/2014 1424   BASOSABS 0.0 06/01/2010 1522    BMET    Component Value Date/Time   NA 138 11/23/2014 0400   NA 137 11/10/2014 1424   K 4.1 11/23/2014 0400   CL 106 11/23/2014 0400   CO2 22 11/23/2014 0400   GLUCOSE 81 11/23/2014 0400   GLUCOSE 97 11/10/2014 1424   BUN 4* 11/23/2014 0400   BUN 12 11/10/2014 1424   CREATININE 0.82 11/23/2014 0400   CALCIUM 8.6 11/23/2014 0400   GFRNONAA >90 11/23/2014 0400   GFRAA >90 11/23/2014 0400    INR    Component Value Date/Time   INR 1.23 11/20/2014 1210     Intake/Output Summary (Last 24 hours) at 11/23/14  0736 Last data filed at 11/23/14 0600  Gross per 24 hour  Intake   2300 ml  Output   2075 ml  Net    225 ml     Assessment:  53 y.o. male is s/p:  aortobifemoral bypass grafting  3 Days Post-Op  Plan: -pt doing well surgically this morning.  He is on alcohol withdrawal protocol and his hands are in mitts.   -labs look good this am-acute surgical blood loss anemia is improving -still without flatus or BM-will give dulcolax supp this am -continue NPO except for po meds and occasional ice chips -pt unable to void this am-bladder scan showed 650cc-pt was I&O cath.  If unable to void, may need foley reinserted and sent home with catheter.   -keep in stepdown until pt is more alert -DVT prophylaxis:  Lovenox   [name], PA-C [facility] [phone] 11/23/2014 7:36 AM  Agree with above Abdomen soft Palpable pulses in feet Confused but not agitated No BM yet  Plan OOB in chair Begin liquids as tolerated Making slow progress Rehabilitation consult recommends home health versus skilled nursing facility

## 2014-11-23 NOTE — Clinical Social Work Note (Signed)
CSW left a voice message for the pt's payee/friend Burnis Kingfisher regarding identifying the pt's family contact information, so they can aid in SNF placement. CSW is awaiting a call back.   [place], MSW, [place]

## 2014-11-24 ENCOUNTER — Encounter (HOSPITAL_COMMUNITY): Payer: Self-pay | Admitting: Vascular Surgery

## 2014-11-24 ENCOUNTER — Inpatient Hospital Stay (HOSPITAL_COMMUNITY): Payer: Medicaid Other

## 2014-11-24 DIAGNOSIS — F1010 Alcohol abuse, uncomplicated: Secondary | ICD-10-CM

## 2014-11-24 DIAGNOSIS — R4182 Altered mental status, unspecified: Secondary | ICD-10-CM

## 2014-11-24 DIAGNOSIS — J9600 Acute respiratory failure, unspecified whether with hypoxia or hypercapnia: Secondary | ICD-10-CM

## 2014-11-24 DIAGNOSIS — F10239 Alcohol dependence with withdrawal, unspecified: Secondary | ICD-10-CM | POA: Diagnosis present

## 2014-11-24 DIAGNOSIS — J9601 Acute respiratory failure with hypoxia: Secondary | ICD-10-CM

## 2014-11-24 DIAGNOSIS — F319 Bipolar disorder, unspecified: Secondary | ICD-10-CM

## 2014-11-24 DIAGNOSIS — F3170 Bipolar disorder, currently in remission, most recent episode unspecified: Secondary | ICD-10-CM | POA: Diagnosis present

## 2014-11-24 DIAGNOSIS — F10231 Alcohol dependence with withdrawal delirium: Secondary | ICD-10-CM

## 2014-11-24 DIAGNOSIS — F10931 Alcohol use, unspecified with withdrawal delirium: Secondary | ICD-10-CM

## 2014-11-24 DIAGNOSIS — R410 Disorientation, unspecified: Secondary | ICD-10-CM

## 2014-11-24 LAB — COMPREHENSIVE METABOLIC PANEL
ALT: 19 U/L (ref 0–53)
AST: 53 U/L — ABNORMAL HIGH (ref 0–37)
Albumin: 3 g/dL — ABNORMAL LOW (ref 3.5–5.2)
Alkaline Phosphatase: 40 U/L (ref 39–117)
Anion gap: 9 (ref 5–15)
BUN: 5 mg/dL — ABNORMAL LOW (ref 6–23)
CO2: 20 mmol/L (ref 19–32)
Calcium: 9.1 mg/dL (ref 8.4–10.5)
Chloride: 109 mEq/L (ref 96–112)
Creatinine, Ser: 1.07 mg/dL (ref 0.50–1.35)
GFR calc Af Amer: 89 mL/min — ABNORMAL LOW (ref >90)
GFR calc non Af Amer: 77 mL/min — ABNORMAL LOW (ref >90)
Glucose, Bld: 108 mg/dL — ABNORMAL HIGH (ref 70–99)
Potassium: 4.5 mmol/L (ref 3.5–5.1)
Sodium: 138 mmol/L (ref 135–145)
Total Bilirubin: 0.9 mg/dL (ref 0.3–1.2)
Total Protein: 5.9 g/dL — ABNORMAL LOW (ref 6.0–8.3)

## 2014-11-24 LAB — BLOOD GAS, ARTERIAL
Acid-base deficit: 7.3 mmol/L — ABNORMAL HIGH (ref 0.0–2.0)
Bicarbonate: 16.5 mEq/L — ABNORMAL LOW (ref 20.0–24.0)
Drawn by: 257081
O2 Content: 10 L/min
O2 Saturation: 98 %
Patient temperature: 100.4
TCO2: 17.3 mmol/L (ref 0–100)
pCO2 arterial: 27.7 mmHg — ABNORMAL LOW (ref 35.0–45.0)
pH, Arterial: 7.397 (ref 7.350–7.450)
pO2, Arterial: 96.1 mmHg (ref 80.0–100.0)

## 2014-11-24 LAB — POCT I-STAT 3, ART BLOOD GAS (G3+)
Acid-base deficit: 7 mmol/L — ABNORMAL HIGH (ref 0.0–2.0)
Bicarbonate: 18.7 mEq/L — ABNORMAL LOW (ref 20.0–24.0)
O2 Saturation: 76 %
Patient temperature: 101.8
TCO2: 20 mmol/L (ref 0–100)
pCO2 arterial: 40.3 mmHg (ref 35.0–45.0)
pH, Arterial: 7.284 — ABNORMAL LOW (ref 7.350–7.450)
pO2, Arterial: 50 mmHg — ABNORMAL LOW (ref 80.0–100.0)

## 2014-11-24 LAB — LITHIUM LEVEL: Lithium Lvl: 0.5 mmol/L — ABNORMAL LOW (ref 0.80–1.40)

## 2014-11-24 LAB — CBC
HCT: 31 % — ABNORMAL LOW (ref 39.0–52.0)
Hemoglobin: 10.2 g/dL — ABNORMAL LOW (ref 13.0–17.0)
MCH: 31.4 pg (ref 26.0–34.0)
MCHC: 32.9 g/dL (ref 30.0–36.0)
MCV: 95.4 fL (ref 78.0–100.0)
Platelets: 211 10*3/uL (ref 150–400)
RBC: 3.25 MIL/uL — ABNORMAL LOW (ref 4.22–5.81)
RDW: 13 % (ref 11.5–15.5)
WBC: 9.3 10*3/uL (ref 4.0–10.5)

## 2014-11-24 LAB — TROPONIN I
Troponin I: <0.03 ng/mL (ref <0.031)
Troponin I: <0.03 ng/mL (ref <0.031)

## 2014-11-24 MED ORDER — MIDAZOLAM HCL 2 MG/2ML IJ SOLN
2.0000 mg | Freq: Once | INTRAMUSCULAR | Status: AC
  Administered 2014-11-24: 2 mg via INTRAVENOUS

## 2014-11-24 MED ORDER — FENTANYL CITRATE 0.05 MG/ML IJ SOLN
100.0000 ug | Freq: Once | INTRAMUSCULAR | Status: AC
  Administered 2014-11-24: 100 ug via INTRAVENOUS

## 2014-11-24 MED ORDER — FOLIC ACID 5 MG/ML IJ SOLN
1.0000 mg | Freq: Every day | INTRAMUSCULAR | Status: DC
  Administered 2014-11-24 – 2014-11-28 (×5): 1 mg via INTRAVENOUS
  Filled 2014-11-24 (×7): qty 0.2

## 2014-11-24 MED ORDER — SODIUM CHLORIDE 0.9 % IV SOLN
INTRAVENOUS | Status: DC
  Administered 2014-11-24: 14:00:00 via INTRAVENOUS

## 2014-11-24 MED ORDER — PROPOFOL 10 MG/ML IV EMUL
5.0000 ug/kg/min | INTRAVENOUS | Status: AC
  Administered 2014-11-24: 15 ug/kg/min via INTRAVENOUS
  Administered 2014-11-25: 40 ug/kg/min via INTRAVENOUS
  Administered 2014-11-25 – 2014-11-26 (×3): 30 ug/kg/min via INTRAVENOUS
  Filled 2014-11-24 (×7): qty 100

## 2014-11-24 MED ORDER — SODIUM CHLORIDE 0.9 % IV SOLN
25.0000 ug/h | INTRAVENOUS | Status: DC
  Administered 2014-11-24: 100 ug/h via INTRAVENOUS
  Filled 2014-11-24: qty 50

## 2014-11-24 MED ORDER — CHLORHEXIDINE GLUCONATE 0.12 % MT SOLN
15.0000 mL | Freq: Two times a day (BID) | OROMUCOSAL | Status: DC
  Administered 2014-11-24 – 2014-11-30 (×12): 15 mL via OROMUCOSAL
  Filled 2014-11-24 (×12): qty 15

## 2014-11-24 MED ORDER — ETOMIDATE 2 MG/ML IV SOLN
20.0000 mg | Freq: Once | INTRAVENOUS | Status: AC
  Administered 2014-11-24: 20 mg via INTRAVENOUS

## 2014-11-24 MED ORDER — TAMSULOSIN HCL 0.4 MG PO CAPS
0.4000 mg | ORAL_CAPSULE | Freq: Every day | ORAL | Status: DC
  Administered 2014-11-25: 0.4 mg via ORAL
  Filled 2014-11-24 (×3): qty 1

## 2014-11-24 MED ORDER — FENTANYL CITRATE 0.05 MG/ML IJ SOLN
INTRAMUSCULAR | Status: AC
  Administered 2014-11-24: 100 ug via INTRAVENOUS
  Filled 2014-11-24: qty 4

## 2014-11-24 MED ORDER — BISACODYL 10 MG RE SUPP: 10.0000 mg | Freq: Once | RECTAL | Status: DC

## 2014-11-24 MED ORDER — CETYLPYRIDINIUM CHLORIDE 0.05 % MT LIQD
7.0000 mL | Freq: Four times a day (QID) | OROMUCOSAL | Status: DC
  Administered 2014-11-24 – 2014-11-30 (×24): 7 mL via OROMUCOSAL

## 2014-11-24 MED ORDER — ALBUTEROL SULFATE (2.5 MG/3ML) 0.083% IN NEBU
INHALATION_SOLUTION | RESPIRATORY_TRACT | Status: AC
  Administered 2014-11-24: 2.5 mg
  Filled 2014-11-24: qty 3

## 2014-11-24 MED ORDER — ROCURONIUM BROMIDE 50 MG/5ML IV SOLN
50.0000 mg | Freq: Once | INTRAVENOUS | Status: AC
  Administered 2014-11-24: 50 mg via INTRAVENOUS

## 2014-11-24 MED ORDER — MIDAZOLAM HCL 2 MG/2ML IJ SOLN
INTRAMUSCULAR | Status: AC
  Administered 2014-11-24: 2 mg via INTRAVENOUS
  Filled 2014-11-24: qty 4

## 2014-11-24 MED ORDER — THIAMINE HCL 100 MG/ML IJ SOLN
100.0000 mg | Freq: Every day | INTRAMUSCULAR | Status: DC
  Administered 2014-11-24 – 2014-11-28 (×5): 100 mg via INTRAVENOUS
  Filled 2014-11-24 (×5): qty 1

## 2014-11-24 MED ORDER — CHLORHEXIDINE GLUCONATE 0.12 % MT SOLN
OROMUCOSAL | Status: AC
Start: 2014-11-24 — End: 2014-11-24
  Administered 2014-11-24: 15 mL
  Filled 2014-11-24: qty 15

## 2014-11-24 MED FILL — Sodium Chloride IV Soln 0.9%: INTRAVENOUS | Qty: 1000 | Status: AC

## 2014-11-24 MED FILL — Heparin Sodium (Porcine) Inj 1000 Unit/ML: INTRAMUSCULAR | Qty: 30 | Status: AC

## 2014-11-24 MED FILL — Sodium Chloride Irrigation Soln 0.9%: Qty: 3000 | Status: AC

## 2014-11-24 NOTE — Procedures (Signed)
Intubation Procedure Note [name] [id number] [birth year]  Procedure: Intubation Indications: Airway protection and maintenance  Procedure Details Consent: Risks of procedure as well as the alternatives and risks of each were explained to the (patient/caregiver).  Consent for procedure obtained. Time Out: Verified patient identification, verified procedure, site/side was marked, verified correct patient position, special equipment/implants available, medications/allergies/relevent history reviewed, required imaging and test results available.  Performed  MAC and 3 Medications:  Fentanyl 100 mcg Etomidate 20 mg Versed 2 mg NMB zemuron 50 mg    Evaluation Hemodynamic Status: BP stable throughout; O2 sats: stable throughout Patient's Current Condition: stable Complications: No apparent complications Patient did tolerate procedure well. Chest X-ray ordered to verify placement.  CXR: tube position acceptable.   [name] ACNP Maryanna Shape PCCM Pager [phone] till 3 pm If no answer page [phone] 11/24/2014, 1:43 PM  I was present and supervised the entire procedure.  Rush Farmer, M.D. [facility] Pulmonary/Critical Care Medicine. Pager: [phone]. After hours pager: [phone].

## 2014-11-24 NOTE — Significant Event (Signed)
Rapid Response Event Note  Overview: Time Called: 1135 Arrival Time: 1140 Event Type: Respiratory  Initial Focused Assessment: Patient in respiratory distress, using accessory muscles.  Diaphoretic Tremulous, unable to follow commands or focus on staff. BP 148/88  ST 160s  RR 30s O2 sat 100% on 55% venti.  Axillary temp 100.4 Good chest rise, but minimal air movement upon auscultation  Interventions: Mouth care Albuterol treatment Patient with better lung sounds, air movement through out. Rhyne PA at bedside Able to focus on staff, not following commands but able to  Communicate a little verbally ABG done BP 150/95  ST 140s RR 36  O2 sat 100% 1mg  Ativan given IV Patient remains tremulous and diaphoretic, becoming restless in the bed. [name] NP at bedside Patient intubated (meds given: 20 Etomidate, 50 Rocuronium, 100 Fentanyl, 2 versed) Transported to 2S15 via bed with o2 via ventilator BP 92/54  SR/ST 100 post intubation 100 Fentanly and 2mg  versed given. RN at bedside, report given, questions answered   Event Summary: Name of Physician Notified: Arlice Colt PA with Dr Kellie Simmering at 1140  Name of Consulting Physician Notified: [name] NP/Dr Nelda Marseille at 1230  Outcome: Transferred (Comment) [phone])  Event End Time: Mona  Raliegh Ip

## 2014-11-24 NOTE — Progress Notes (Signed)
Pt began having more respiratory distress than this morning. Vital signs obtained and are as follows. Rapid response called and assessed patient. Suctioning preformed by rapid as well as neb treatment given. Samantha PA called and CCM consulted. Pt emergently intubated at bedside due increasing respiratory distress and agitation. Report was given at bedside to receiving nurse and all questions answered. Family aware of new events as well as transfer.     11/24/14 1144  Vitals  Temp (!) 100.4 F (38 C)  Temp Source Axillary  BP (!) 148/88 mmHg  MAP (mmHg) 102  BP Location Right Arm  BP Method Automatic  Patient Position (if appropriate) Lying  Pulse Rate (!) 146  Pulse Rate Source Monitor  ECG Heart Rate (!) 145  Resp (!) 32  Oxygen Therapy  SpO2 100 %  O2 Device Venturi Mask  O2 Flow Rate (L/min) 15 L/min  FiO2 (%) 55 %  Pulse Oximetry Type Continuous

## 2014-11-24 NOTE — Progress Notes (Signed)
Pt better at this time.    His ABG is 7.39/27.7/96.1/16.5.  Just spoke to pt's sister, who verifies that the pt drinks at least 12 beers/day.  He is still agitated at this time and does have tremors and appears to be in DT's.  He has just been given Ativan 1mg .  Labs looked okay yesterday.  Stat CBC and CMP drawn.  Pt still with increased RR, but this has improved.  His O2 sats are 100%.  He has received albuterol and suctioning and his respirations improved after this.    He does have a low grade temp of 100.4.  Sending u/a and sputum culture (if able to obtain).  Will keep NPO at this time with exception of sips and chips.  CCM has been contacted.  The pt is to transfer to [place].  Psych to see pt this afternoon to evaluate his medications.  Will continue to hold these today until he is seen by psych.    [name] 11/24/2014. 12:33 PM

## 2014-11-24 NOTE — Progress Notes (Addendum)
Progress Note    11/24/2014 7:28 AM 4 Days Post-Op  Subjective:  Sleeping somewhat difficult to awaken, but he did awake.  He is confused.  Tm 99.7 now afebrile  Filed Vitals:   11/24/14 0508  BP: 125/72  Pulse: 82  Temp: 98.4 F (36.9 C)  Resp: 16    Physical Exam: Cardiac:  regular Lungs:  Coarse BS at bases Incisions:  Bilateral groin incisions are c/d/i with ecchymosis; midline incision is c/d/i with staples in tact. Extremities:  Bilateral feet are warm with palpable DP pulses bilaterally Abdomen: firmer than yesterday; +BS  CBC    Component Value Date/Time   WBC 6.8 11/23/2014 0400   WBC CANCELED 11/10/2014 1424   RBC 2.83* 11/23/2014 0400   RBC CANCELED 11/10/2014 1424   HGB 8.9* 11/23/2014 0400   HCT 26.9* 11/23/2014 0400   PLT 124* 11/23/2014 0400   MCV 95.1 11/23/2014 0400   MCH 31.4 11/23/2014 0400   MCH 34.6* 06/09/2013 1524   MCHC 33.1 11/23/2014 0400   MCHC 35.6 06/09/2013 1524   RDW 12.7 11/23/2014 0400   RDW 13.9 06/09/2013 1524   LYMPHSABS CANCELED 11/10/2014 1424   LYMPHSABS 1.8 06/01/2010 1522   MONOABS 0.6 06/01/2010 1522   EOSABS CANCELED 11/10/2014 1424   EOSABS 0.0 06/01/2010 1522   BASOSABS CANCELED 11/10/2014 1424   BASOSABS 0.0 06/01/2010 1522    BMET    Component Value Date/Time   NA 138 11/23/2014 0400   NA 137 11/10/2014 1424   K 4.1 11/23/2014 0400   CL 106 11/23/2014 0400   CO2 22 11/23/2014 0400   GLUCOSE 81 11/23/2014 0400   GLUCOSE 97 11/10/2014 1424   BUN 4* 11/23/2014 0400   BUN 12 11/10/2014 1424   CREATININE 0.82 11/23/2014 0400   CALCIUM 8.6 11/23/2014 0400   GFRNONAA >90 11/23/2014 0400   GFRAA >90 11/23/2014 0400    INR    Component Value Date/Time   INR 1.23 11/20/2014 1210     Intake/Output Summary (Last 24 hours) at 11/24/14 3419 Last data filed at 11/24/14 6222  Gross per 24 hour  Intake   1100 ml  Output   2225 ml  Net  -1125 ml     Assessment:  53 y.o. male is s/p:  aortobifemoral  bypass grafting   4 Days Post-Op  Plan: -pt confused this am-answers questions but sometimes inappropriately.  He is on Abilify, xanax, Wellbutrin, Depakote, Vyvanse, and Lithium.  Will ask psych to see pt as these medications could be causing some of his drowsiness.  Difficult to assess if he was actually taking them at home and the meds were restarted here or if he was actually taking them at home.  -pt's family states that he has not had any etoh in 3 years, however, when asking pt he says 1 week prior to admission.  -pt with junky cough this am and rhonchi- will order a chest xray.  Pt needs to be OOB to chair and increasing mobilization.  Understand this can be difficult with him being drowsy.   -he did have a low grade fever yesterday & has junky cough- will order CXR this morning.  If able to cough up any sputum, will also send for cx. -minimal success with suppository-will try suppository again today. -DVT prophylaxis:  Lovenox -pt unable to void yesterday and foley catheter was placed.  -psych Harrietta Guardian, NP returned call and she will evaluate pt this afternoon (she is at outside hospital this am).  Will hold meds this morning as pt is lethargic and also, psych can evaluate medications.      [name], PA-C [facility] [phone] 11/24/2014 7:28 AM agree with above assessment Patient continues to be very confused and disoriented but arousable. Moves all extremities and does not appear to have had a CVA Aortobifemoral bypass graft is functioning nicely with well-healing incisions Not safe to feed patient yet because of his confusion Some of this could be due to his multiple psychotropic medications which he was supposedly taking preoperatively  Will await suggestions from neuro regarding his medications, continue to get patient out of bed in chair Confusion may be due to alcohol withdrawal-DTs Lab work all looked good yesterday will repeat in  a.m. We'll keep patient on 3 S. for now and agree that he may need skilled nursing facility initially at discharge

## 2014-11-24 NOTE — Consult Note (Signed)
[facility] Consult   Reason for Consult:  Alcohol dependence, medication adjustments Referring Physician:  Primary PA  [name] is an 53 y.o. male. Total Time spent with patient: 45 minutes  Assessment: AXIS I:  Bipolar disorder; alcohol dependence with complicated withdrawal AXIS II:  Deferred AXIS III:   Past Medical History  Diagnosis Date  . Hypertension   . Hyperlipidemia   . Myocardial infarction 1996; 1997; 2000's    "total of 3" (10/22/2012)  . Hepatitis C     "diagnosed in the past 2 wk" (10/22/2012)  . Seizures     "when I get anxious" (10/22/2012)  . Anxiety   . Depression   . Schizophrenia   . PAD (peripheral artery disease)     2010 PTA & stent to left CIA & left SFA; 05/2012 PTA & stent to distal LCIA; 10/2012 thrombectomy and stents to [place]  . History of ETOH abuse     quit 2007  . Tobacco abuse   . Broken fingers   . Coronary artery disease     s/p PCI to RCA at [facility]; s/p PCI to RCA '08; Cath 05/2012 showed known occluded LCx, RCA stent w/ mild restenosis, LAD no significant dz, EF 35% . Cardiac cath in July of 2014 showed no significant change.   Marland Kitchen GERD (gastroesophageal reflux disease)     takes Protonix daily  . Bipolar affective     takes Lithium daily   AXIS IV:  unable to assess AXIS V:  41-50 serious symptoms  Plan:  Continue CIWA Ativan detox protocol.  Stop the Vyvanse 70 mg daily, Wellbutrin 300 mg daily (due to increase in risk of seizure, lowers the threshold for seizures), Lithium, and Xanax.  Continue current Depakote and Abilify regiment.  Medications can be reassessed after patient has stabilized medically and detox completed.  Dr. Parke Poisson reviewed the patient and concurs with the plan.  Subjective:   [name] is a 53 y.o. male patient intubated due to alcohol withdrawal.  HPI:  The patient admitted for surgery and started having withdrawal symptoms today to the point of the need for intubation.   His home medication list for psychiatry is quite extensive with reports from the nurses of noncompliance of medical medications and most likely psychiatric medications. HPI Elements:   Location:  generalized. Quality:  acute. Severity:  severe. Timing:  constant. Duration:  one day. Context:  alcohol withdrawal.  Past Psychiatric History: Past Medical History  Diagnosis Date  . Hypertension   . Hyperlipidemia   . Myocardial infarction 1996; 1997; 2000's    "total of 3" (10/22/2012)  . Hepatitis C     "diagnosed in the past 2 wk" (10/22/2012)  . Seizures     "when I get anxious" (10/22/2012)  . Anxiety   . Depression   . Schizophrenia   . PAD (peripheral artery disease)     2010 PTA & stent to left CIA & left SFA; 05/2012 PTA & stent to distal LCIA; 10/2012 thrombectomy and stents to [place]  . History of ETOH abuse     quit 2007  . Tobacco abuse   . Broken fingers   . Coronary artery disease     s/p PCI to RCA at [facility]; s/p PCI to RCA '08; Cath 05/2012 showed known occluded LCx, RCA stent w/ mild restenosis, LAD no significant dz, EF 35% . Cardiac cath in July of 2014 showed no significant change.   Marland Kitchen  GERD (gastroesophageal reflux disease)     takes Protonix daily  . Bipolar affective     takes Lithium daily    reports that he has been smoking Cigarettes.  He has a 40 pack-year smoking history. He has never used smokeless tobacco. He reports that he does not drink alcohol or use illicit drugs. Family History  Problem Relation Age of Onset  . Emphysema Father   . Diabetes Other   . Alcohol abuse Other   . Hypertension Other   . Hyperlipidemia Other   . Seizures Other      Living Arrangements: Non-relatives/Friends   Abuse/Neglect [facility]) Physical Abuse: Denies Verbal Abuse: Denies Sexual Abuse: Denies Allergies:  No Known Allergies  ACT Assessment Complete:  No:   Past Psychiatric History: Diagnosis:  Bipolar disorder  Hospitalizations:  Unable to assess,  patient intubated  Outpatient Care:  Unable to assess, patient intubated  Substance Abuse Care:  Unable to assess, patient intubated  Self-Mutilation:  Unable to assess, patient intubated  Suicidal Attempts:  Unable to assess, patient intubated  Homicidal Behaviors:  Unable to assess, patient intubated   Violent Behaviors:  Unable to assess, patient intubated   Place of Residence:  [place], [place] Marital Status:  Divorced Employed/Unemployed:  Unable to assess, patient intubated Education:  Unable to assess, patient intubated Family Supports:  Sister, mother, son Objective: Blood pressure 101/48, pulse 66, temperature 98.5 F (36.9 C), temperature source Oral, resp. rate 13, height _0  (1.702 m), weight 160 lb 0.9 oz (72.6 kg), SpO2 100 %.Body mass index is 25.06 kg/(m^2). Results for orders placed or performed during the hospital encounter of 11/20/14 (from the past 72 hour(s))  CBC     Status: Abnormal   Collection Time: 11/22/14  3:27 AM  Result Value Ref Range   WBC 6.9 4.0 - 10.5 K/uL   RBC 2.61 (L) 4.22 - 5.81 MIL/uL   Hemoglobin 8.4 (L) 13.0 - 17.0 g/dL    Comment: REPEATED TO VERIFY   HCT 25.4 (L) 39.0 - 52.0 %   MCV 97.3 78.0 - 100.0 fL   MCH 32.2 26.0 - 34.0 pg   MCHC 33.1 30.0 - 36.0 g/dL   RDW 12.9 11.5 - 15.5 %   Platelets 106 (L) 150 - 400 K/uL    Comment: REPEATED TO VERIFY PLATELET COUNT CONFIRMED BY SMEAR   Basic metabolic panel     Status: Abnormal   Collection Time: 11/22/14  3:27 AM  Result Value Ref Range   Sodium 139 137 - 147 mEq/L   Potassium 4.2 3.7 - 5.3 mEq/L   Chloride 108 96 - 112 mEq/L   CO2 24 19 - 32 mEq/L   Glucose, Bld 100 (H) 70 - 99 mg/dL   BUN 7 6 - 23 mg/dL   Creatinine, Ser 0.84 0.50 - 1.35 mg/dL   Calcium 8.0 (L) 8.4 - 10.5 mg/dL   GFR calc non Af Amer >90 >90 mL/min   GFR calc Af Amer >90 >90 mL/min    Comment: (NOTE) The eGFR has been calculated using the CKD EPI equation. This calculation has not been validated in all clinical  situations. eGFR's persistently <90 mL/min signify possible Chronic Kidney Disease.    Anion gap 7 5 - 15  Glucose, capillary     Status: Abnormal   Collection Time: 11/22/14  9:06 AM  Result Value Ref Range   Glucose-Capillary 102 (H) 70 - 99 mg/dL   Comment 1 Notify RN   Glucose, capillary  Status: None   Collection Time: 11/22/14 12:27 PM  Result Value Ref Range   Glucose-Capillary 89 70 - 99 mg/dL  Glucose, capillary     Status: None   Collection Time: 11/22/14  5:18 PM  Result Value Ref Range   Glucose-Capillary 99 70 - 99 mg/dL  CBC     Status: Abnormal   Collection Time: 11/23/14  4:00 AM  Result Value Ref Range   WBC 6.8 4.0 - 10.5 K/uL   RBC 2.83 (L) 4.22 - 5.81 MIL/uL   Hemoglobin 8.9 (L) 13.0 - 17.0 g/dL   HCT 26.9 (L) 39.0 - 52.0 %   MCV 95.1 78.0 - 100.0 fL   MCH 31.4 26.0 - 34.0 pg   MCHC 33.1 30.0 - 36.0 g/dL   RDW 12.7 11.5 - 15.5 %   Platelets 124 (L) 150 - 400 K/uL  Basic metabolic panel     Status: Abnormal   Collection Time: 11/23/14  4:00 AM  Result Value Ref Range   Sodium 138 137 - 147 mEq/L   Potassium 4.1 3.7 - 5.3 mEq/L   Chloride 106 96 - 112 mEq/L   CO2 22 19 - 32 mEq/L   Glucose, Bld 81 70 - 99 mg/dL   BUN 4 (L) 6 - 23 mg/dL   Creatinine, Ser 0.82 0.50 - 1.35 mg/dL   Calcium 8.6 8.4 - 10.5 mg/dL   GFR calc non Af Amer >90 >90 mL/min   GFR calc Af Amer >90 >90 mL/min    Comment: (NOTE) The eGFR has been calculated using the CKD EPI equation. This calculation has not been validated in all clinical situations. eGFR's persistently <90 mL/min signify possible Chronic Kidney Disease.    Anion gap 10 5 - 15  Blood gas, arterial     Status: Abnormal   Collection Time: 11/24/14 11:50 AM  Result Value Ref Range   O2 Content 10.0 L/min   Delivery systems OXYGEN MASK    pH, Arterial 7.397 7.350 - 7.450   pCO2 arterial 27.7 (L) 35.0 - 45.0 mmHg   pO2, Arterial 96.1 80.0 - 100.0 mmHg   Bicarbonate 16.5 (L) 20.0 - 24.0 mEq/L   TCO2 17.3 0 -  100 mmol/L   Acid-base deficit 7.3 (H) 0.0 - 2.0 mmol/L   O2 Saturation 98.0 %   Patient temperature 100.4    Collection site RIGHT RADIAL    Drawn by [id number]    Sample type ARTERIAL DRAW    Allens test (pass/fail) PASS PASS  Lithium level     Status: Abnormal   Collection Time: 11/24/14 12:00 PM  Result Value Ref Range   Lithium Lvl 0.50 (L) 0.80 - 1.40 mmol/L  CBC     Status: Abnormal   Collection Time: 11/24/14 12:05 PM  Result Value Ref Range   WBC 9.3 4.0 - 10.5 K/uL   RBC 3.25 (L) 4.22 - 5.81 MIL/uL   Hemoglobin 10.2 (L) 13.0 - 17.0 g/dL   HCT 31.0 (L) 39.0 - 52.0 %   MCV 95.4 78.0 - 100.0 fL   MCH 31.4 26.0 - 34.0 pg   MCHC 32.9 30.0 - 36.0 g/dL   RDW 13.0 11.5 - 15.5 %   Platelets 211 150 - 400 K/uL  Comprehensive metabolic panel     Status: Abnormal   Collection Time: 11/24/14 12:05 PM  Result Value Ref Range   Sodium 138 135 - 145 mmol/L    Comment: Please note change in reference range.   Potassium 4.5  3.5 - 5.1 mmol/L    Comment: Please note change in reference range.   Chloride 109 96 - 112 mEq/L   CO2 20 19 - 32 mmol/L   Glucose, Bld 108 (H) 70 - 99 mg/dL   BUN 5 (L) 6 - 23 mg/dL   Creatinine, Ser 1.07 0.50 - 1.35 mg/dL   Calcium 9.1 8.4 - 10.5 mg/dL   Total Protein 5.9 (L) 6.0 - 8.3 g/dL   Albumin 3.0 (L) 3.5 - 5.2 g/dL   AST 53 (H) 0 - 37 U/L   ALT 19 0 - 53 U/L   Alkaline Phosphatase 40 39 - 117 U/L   Total Bilirubin 0.9 0.3 - 1.2 mg/dL   GFR calc non Af Amer 77 (L) >90 mL/min   GFR calc Af Amer 89 (L) >90 mL/min    Comment: (NOTE) The eGFR has been calculated using the CKD EPI equation. This calculation has not been validated in all clinical situations. eGFR's persistently <90 mL/min signify possible Chronic Kidney Disease.    Anion gap 9 5 - 15  I-STAT 3, arterial blood gas (G3+)     Status: Abnormal   Collection Time: 11/24/14  1:47 PM  Result Value Ref Range   pH, Arterial 7.284 (L) 7.350 - 7.450   pCO2 arterial 40.3 35.0 - 45.0 mmHg   pO2,  Arterial 50.0 (L) 80.0 - 100.0 mmHg   Bicarbonate 18.7 (L) 20.0 - 24.0 mEq/L   TCO2 20 0 - 100 mmol/L   O2 Saturation 76.0 %   Acid-base deficit 7.0 (H) 0.0 - 2.0 mmol/L   Patient temperature 101.8 F    Collection site BRACHIAL ARTERY    Drawn by Operator    Sample type ARTERIAL   Troponin I (q 6hr x 3)     Status: None   Collection Time: 11/24/14  2:09 PM  Result Value Ref Range   Troponin I <0.03 <0.031 ng/mL    Comment:        NO INDICATION OF MYOCARDIAL INJURY. Please note change in reference range.    Labs are reviewed and are pertinent for medical issues being addressed.  Current Facility-Administered Medications  Medication Dose Route Frequency Provider Last Rate Last Dose  . 0.9 %  sodium chloride infusion   Intravenous Continuous Grace Bushy Minor, NP 100 mL/hr at 11/24/14 1410    . acetaminophen (TYLENOL) tablet 325-650 mg  325-650 mg Oral Q4H PRN Hulen Shouts Rhyne, PA-C       Or  . acetaminophen (TYLENOL) suppository 325-650 mg  325-650 mg Rectal Q4H PRN Hulen Shouts Rhyne, PA-C   650 mg at 11/24/14 1401  . ALPRAZolam Duanne Moron) tablet 2 mg  2 mg Oral TID PRN Serafina Mitchell, MD   2 mg at 11/22/14 2305  . alum & mag hydroxide-simeth (MAALOX/MYLANTA) 200-200-20 MG/5ML suspension 15-30 mL  15-30 mL Oral Q2H PRN Samantha J Rhyne, PA-C      . amLODipine (NORVASC) tablet 5 mg  5 mg Oral Daily Serafina Mitchell, MD   5 mg at 11/23/14 0929  . antiseptic oral rinse (CPC / CETYLPYRIDINIUM CHLORIDE 0.05%) solution 7 mL  7 mL Mouth Rinse QID Mal Misty, MD   7 mL at 11/24/14 1600  . ARIPiprazole (ABILIFY) tablet 20 mg  20 mg Oral QHS Serafina Mitchell, MD   20 mg at 11/23/14 2113  . aspirin chewable tablet 81 mg  81 mg Oral Daily Mal Misty, MD   81 mg at 11/23/14  [id number]  . bisacodyl (DULCOLAX) suppository 10 mg  10 mg Rectal Once Samantha J Rhyne, PA-C      . buPROPion (WELLBUTRIN XL) 24 hr tablet 300 mg  300 mg Oral Daily Serafina Mitchell, MD   300 mg at 11/23/14 0940  . chlorhexidine  (PERIDEX) 0.12 % solution 15 mL  15 mL Mouth Rinse BID Mal Misty, MD   15 mL at 11/24/14 2030  . divalproex (DEPAKOTE) DR tablet 500 mg  500 mg Oral TID Serafina Mitchell, MD   500 mg at 11/23/14 2113  . docusate sodium (COLACE) capsule 100 mg  100 mg Oral Daily Hulen Shouts Rhyne, PA-C   100 mg at 11/23/14 1478  . enoxaparin (LOVENOX) injection 30 mg  30 mg Subcutaneous Q24H Samantha J Rhyne, PA-C   30 mg at 11/23/14 1337  . fentaNYL (SUBLIMAZE) 2,500 mcg in sodium chloride 0.9 % 250 mL (10 mcg/mL) infusion  25-400 mcg/hr Intravenous Continuous Rush Farmer, MD 2.5 mL/hr at 11/24/14 2000 25 mcg/hr at 11/24/14 2000  . folic acid injection 1 mg  1 mg Intravenous Daily Grace Bushy Minor, NP   1 mg at 11/24/14 1527  . guaiFENesin-dextromethorphan (ROBITUSSIN DM) 100-10 MG/5ML syrup 15 mL  15 mL Oral Q4H PRN Samantha J Rhyne, PA-C      . hydrALAZINE (APRESOLINE) injection 5 mg  5 mg Intravenous Q20 Min PRN Samantha J Rhyne, PA-C      . labetalol (NORMODYNE,TRANDATE) injection 10 mg  10 mg Intravenous Q10 min PRN Samantha J Rhyne, PA-C      . lisdexamfetamine (VYVANSE) capsule 70 mg  70 mg Oral BH-q7a Serafina Mitchell, MD   70 mg at 11/23/14 0940  . lisinopril (PRINIVIL,ZESTRIL) tablet 2.5 mg  2.5 mg Oral Daily Serafina Mitchell, MD   2.5 mg at 11/23/14 0929  . lithium carbonate (ESKALITH) CR tablet 450 mg  450 mg Oral BID Serafina Mitchell, MD   450 mg at 11/23/14 2113  . LORazepam (ATIVAN) injection 2-3 mg  2-3 mg Intravenous Q1H PRN Hulen Shouts Rhyne, PA-C   1 mg at 11/24/14 1238  . metoprolol (LOPRESSOR) injection 2-5 mg  2-5 mg Intravenous Q2H PRN Samantha J Rhyne, PA-C      . metoprolol (LOPRESSOR) injection 2.5 mg  2.5 mg Intravenous 4 times per day Gabriel Earing, PA-C   2.5 mg at 11/23/14 1748  . morphine 2 MG/ML injection 2-5 mg  2-5 mg Intravenous Q1H PRN Hulen Shouts Rhyne, PA-C   2 mg at 11/24/14 0313  . ondansetron (ZOFRAN) injection 4 mg  4 mg Intravenous Q6H PRN Hulen Shouts Rhyne, PA-C   4 mg at  11/20/14 2009  . oxyCODONE-acetaminophen (PERCOCET/ROXICET) 5-325 MG per tablet 1 tablet  1 tablet Oral Q4H PRN Serafina Mitchell, MD   1 tablet at 11/21/14 1045  . pantoprazole (PROTONIX) EC tablet 40 mg  40 mg Oral Daily Mal Misty, MD   40 mg at 11/23/14 2956   Or  . pantoprazole (PROTONIX) injection 40 mg  40 mg Intravenous Daily Mal Misty, MD   40 mg at 11/22/14 0948  . propofol (DIPRIVAN) 10 mg/ml infusion  5-70 mcg/kg/min Intravenous Continuous Rush Farmer, MD 3.5 mL/hr at 11/24/14 2000 8 mcg/kg/min at 11/24/14 2000  . tamsulosin (FLOMAX) capsule 0.4 mg  0.4 mg Oral Daily Samantha J Rhyne, PA-C      . thiamine (B-1) injection 100 mg  100 mg Intravenous Daily Grace Bushy  Minor, NP   100 mg at 11/24/14 1527    Psychiatric Specialty Exam:     Blood pressure 101/48, pulse 66, temperature 98.5 F (36.9 C), temperature source Oral, resp. rate 13, height _0  (1.702 m), weight 160 lb 0.9 oz (72.6 kg), SpO2 100 %.Body mass index is 25.06 kg/(m^2).  General Appearance: Disheveled  Eye Sport and exercise psychologist::  None  Speech:  Unable to assess, patient intubated  Volume:  Unable to assess, patient intubate  Mood:  Unable to assess, patient intubated   Affect:  Blunt  Thought Process:  Unable to assess, patient intubated  Orientation:  Other:  Unable to assess, patient intubated  Thought Content:  Unable to assess, patient intubated  Suicidal Thoughts:  Unable to assess, patient intubated  Homicidal Thoughts: Unable to assess, patient intubated  Memory:  Unable to assess, patient intubated  Judgement:  Impaired  Insight:  Unable to assess, patient intubated  Psychomotor Activity:  Decreased  Concentration:  Unable to assess, patient intubated  Recall: Unable to assess, patient intubated  Fund of Knowledge:Unable to assess, patient intubated  Language:Unable to assess, patient intubated  Akathisia:  Unable to assess, patient intubated  Handed: Unable to assess, patient intubated  AIMS (if  indicated):     Assets:  Catering manager Housing Resilience Social Support  Sleep:      Musculoskeletal: Strength & Muscle Tone: decreased Gait & Station: Unable to assess, patient intubated Patient leans: N/A  Treatment Plan Summary:  Continue CIWA Ativan detox protocol.  Stop the Vyvanse 70 mg daily, Wellbutrin 300 mg daily (due to increase in risk of seizure, lowers the threshold for seizures), Lithium, and Xanax.  Continue current Depakote and Abilify regiment.  Medications can be reassessed after patient has stabilized medically and detox completed.  Dr. Parke Poisson reviewed the patient and concurs with the plan.  Waylan Boga, [place] 11/24/2014 8:57 PM   Patient seen and case  reviewed with NP as above

## 2014-11-24 NOTE — Progress Notes (Signed)
Pt transported from 3S02 to 2S15 on vent.

## 2014-11-24 NOTE — Clinical Social Work Placement (Signed)
Clinical Social Work Department CLINICAL SOCIAL WORK PLACEMENT NOTE 11/24/2014  Patient:  SIMCHA, SPEIR  Account Number:  [id number] Admit date:  11/20/2014  Clinical Social Worker:  Greta Doom, LCSWA  Date/time:  11/24/2014 02:47 PM  Clinical Social Work is seeking post-discharge placement for this patient at the following level of care:   SKILLED NURSING   (*CSW will update this form in Epic as items are completed)   11/24/2014  Patient/family provided with [facility] Department of Clinical Social Work's list of facilities offering this level of care within the geographic area requested by the patient (or if unable, by the patient's family).  11/24/2014  Patient/family informed of their freedom to choose among providers that offer the needed level of care, that participate in Medicare, Medicaid or managed care program needed by the patient, have an available bed and are willing to accept the patient.  11/24/2014  Patient/family informed of [facility]' ownership interest in [facility], as well as of the fact that they are under no obligation to receive care at this facility.  PASARR submitted to EDS on 11/24/2014 PASARR number received on   FL2 transmitted to all facilities in geographic area requested by pt/family on  11/24/2014 FL2 transmitted to all facilities within larger geographic area on 11/24/2014  Patient informed that his/her managed care company has contracts with or will negotiate with  certain facilities, including the following:     Patient/family informed of bed offers received:   Patient chooses bed at  Physician recommends and patient chooses bed at    Patient to be transferred to  on   Patient to be transferred to facility by  Patient and family notified of transfer on  Name of family member notified:    The following physician request were entered in Epic:   Additional Comments: PASSAR manuel screening   [place], MSW,  [place]

## 2014-11-24 NOTE — Clinical Social Work Psychosocial (Signed)
Clinical Social Work Department BRIEF PSYCHOSOCIAL ASSESSMENT 11/24/2014  Patient:  [name], [name]     Account Number:  [id number]     Admit date:  11/20/2014  Clinical Social Worker:  Marciano Sequin  Date/Time:  11/24/2014 08:00 AM  Referred by:  RN  Date Referred:  11/24/2014 Referred for  SNF Placement   Other Referral:   Interview type:  Family Other interview type:   Pt oriented to self only: pt's sister Jiles Garter [phone], and Cyndia Diver [phone]    PSYCHOSOCIAL DATA Living Status:  ALONE Admitted from facility:   Level of care:   Primary support name:  Kathyrn Lass (sister) and Matt Holmes (friend) Primary support relationship to patient:  SIBLING Degree of support available:   Strong Support    CURRENT CONCERNS Current Concerns  None Noted   Other Concerns:    SOCIAL WORK ASSESSMENT / PLAN CSW spoke with the pt's sisters Lisa/Debra via phone conference. CSW introduced self and purpose of call. CSW discussed clinical team recommendations for rehab. CSW and the family discussed the geographic area in which the family is interest in the pt receiving rehab. The pt expressed interested wanting the pt close to his home as possible. CSW provided the pt's family with contact information for further questions. CSW will continue to follow this pt and assist with discharge as needed.   Assessment/plan status:  Psychosocial Support/Ongoing Assessment of Needs Other assessment/ plan:   Information/referral to community resources:    PATIENT'S/FAMILY'S RESPONSE TO PLAN OF CARE: The pt's family has agreed to place the pt in skilled care before the pt transtion home.     [place], MSW, [place]

## 2014-11-24 NOTE — Consult Note (Signed)
PULMONARY / CRITICAL CARE MEDICINE   Name: [name] MRN: [id number] DOB: January 19, 1961    ADMISSION DATE:  11/20/2014 CONSULTATION DATE: 12/22  REFERRING MD :  Kellie Simmering  CHIEF COMPLAINT:  Agitation  INITIAL PRESENTATION: ABF surgery 12/18  STUDIES:    SIGNIFICANT EVENTS: 12/22 intubated    HISTORY OF PRESENT ILLNESS:  12/22 called to bedside for acute etoh withdrawal, AMS, tachycardia and extreme agitation refractory to benzodiazepines. [name] is a 53 yo WM with known PVD who underwent  Aorta bifemoral bypass 12/18 per Dr. Kellie Simmering. He has a known hx of ETOH abuse, schizophrenia and non compliance. He will be intubated and transferred to ICU. We will check trop I, abg,  c x r for completeness and continue treatment for etoh wd.  PAST MEDICAL HISTORY :   has a past medical history of Hypertension; Hyperlipidemia; Myocardial infarction (1996; 1997; 2000's); Hepatitis C; Seizures; Anxiety; Depression; Schizophrenia; PAD (peripheral artery disease); History of ETOH abuse; Tobacco abuse; Broken fingers; Coronary artery disease; GERD (gastroesophageal reflux disease); and Bipolar affective.  has past surgical history that includes Subdural hematoma evacuation via craniotomy; Anterior cervical decomp/discectomy fusion (01/2011); Iliac artery stent (10/22/2012); Brain surgery; Posterior fusion cervical spine (2012); Liver biopsy (10/2012); Cardiac catheterization; Coronary angioplasty with stent (03/2007); Iliac artery stent (06/2012); left heart catheterization with coronary angiogram (N/A, 05/29/2012); lower extremity angiogram (N/A, 05/29/2012); percutaneous stent intervention (Left, 05/29/2012); abdominal aortagram (N/A, 10/22/2012); abdominal aortagram (N/A, 11/11/2014); and Aorta - bilateral femoral artery bypass graft (N/A, 11/20/2014). Prior to Admission medications   Medication Sig Start Date End Date Taking? Authorizing Provider  ABILIFY 20 MG tablet Take 20 mg by mouth at bedtime.  10/30/14  Yes Historical Provider, MD  alprazolam Duanne Moron) 2 MG tablet Take 2 mg by mouth 3 (three) times daily as needed for sleep or anxiety.    Yes Historical Provider, MD  amLODipine (NORVASC) 5 MG tablet TAKE 1 TABLET BY MOUTH EVERY DAY   Yes Wellington Hampshire, MD  aspirin 81 MG EC tablet Take 81 mg by mouth daily.     Yes Historical Provider, MD  buPROPion (WELLBUTRIN XL) 300 MG 24 hr tablet Take 300 mg by mouth daily.   Yes Historical Provider, MD  divalproex (DEPAKOTE) 500 MG EC tablet Take 500 mg by mouth 3 (three) times daily.    Yes Historical Provider, MD  lisdexamfetamine (VYVANSE) 70 MG capsule Take 70 mg by mouth every morning.   Yes Historical Provider, MD  lisinopril (PRINIVIL,ZESTRIL) 2.5 MG tablet Take 2.5 mg by mouth daily.   Yes Historical Provider, MD  lithium carbonate (ESKALITH) 450 MG CR tablet Take 450 mg by mouth 2 (two) times daily. 11/01/14  Yes Historical Provider, MD  oxyCODONE-acetaminophen (ROXICET) 5-325 MG per tablet Take 1 tablet by mouth every 4 (four) hours as needed for severe pain. 11/11/14  Yes Rhonda G Barrett, PA-C  pantoprazole (PROTONIX) 40 MG tablet Take 1 tablet (40 mg total) by mouth daily. 01/19/12  Yes Minna Merritts, MD   No Known Allergies  FAMILY HISTORY:  has no family status information on file.  SOCIAL HISTORY:  reports that he has been smoking Cigarettes.  He has a 40 pack-year smoking history. He has never used smokeless tobacco. He reports that he does not drink alcohol or use illicit drugs.  REVIEW OF SYSTEMS:  na  SUBJECTIVE:   VITAL SIGNS: Temp:  [98.4 F (36.9 C)-100.4 F (38 C)] 100.4 F (38 C) (12/22 1144) Pulse Rate:  [73-146] 146 (  12/22 1144) Resp:  [16-32] 32 (12/22 1144) BP: (119-157)/(72-88) 148/88 mmHg (12/22 1144) SpO2:  [97 %-100 %] 100 % (12/22 1144) FiO2 (%):  [100 %] 100 % (12/22 1307) HEMODYNAMICS:   VENTILATOR SETTINGS: Vent Mode:  [-] PRVC FiO2 (%):  [100 %] 100 % Set Rate:  [14 bmp] 14 bmp Vt Set:   [530 mL] 530 mL PEEP:  [5 cmH20] 5 cmH20 Plateau Pressure:  [14 cmH20] 14 cmH20 INTAKE / OUTPUT:  Intake/Output Summary (Last 24 hours) at 11/24/14 1344 Last data filed at 11/24/14 0735  Gross per 24 hour  Intake   1200 ml  Output   1725 ml  Net   -525 ml    PHYSICAL EXAMINATION: General:  Agitated and confused Neuro:  Barely able to follow commands HEENT: No JVD/LAN Cardiovascular: HSR RRR Lungs: Diminished in bases Abdomen:  Soft + bs Musculoskeletal:  Intact Skin:  Warm, diaphoretic   LABS:  CBC  Recent Labs Lab 11/22/14 0327 11/23/14 0400 11/24/14 1205  WBC 6.9 6.8 9.3  HGB 8.4* 8.9* 10.2*  HCT 25.4* 26.9* 31.0*  PLT 106* 124* 211   Coag's  Recent Labs Lab 11/17/14 1405 11/20/14 1210  APTT 39* 28  INR 1.04 1.23   BMET  Recent Labs Lab 11/22/14 0327 11/23/14 0400 11/24/14 1205  NA 139 138 138  K 4.2 4.1 4.5  CL 108 106 109  CO2 24 22 20   BUN 7 4* 5*  CREATININE 0.84 0.82 1.07  GLUCOSE 100* 81 108*   Electrolytes  Recent Labs Lab 11/20/14 1140 11/21/14 0430 11/22/14 0327 11/23/14 0400 11/24/14 1205  CALCIUM 8.4 8.0* 8.0* 8.6 9.1  MG 2.1 2.2  --   --   --    Sepsis Markers No results for input(s): LATICACIDVEN, PROCALCITON, O2SATVEN in the last 168 hours. ABG  Recent Labs Lab 11/20/14 1150 11/21/14 0506 11/24/14 1150  PHART 7.336* 7.446 7.397  PCO2ART 49.9* 28.3* 27.7*  PO2ART 107.0* 94.0 96.1   Liver Enzymes  Recent Labs Lab 11/17/14 1405 11/21/14 0430 11/24/14 1205  AST 15 17 53*  ALT 10 7 19   ALKPHOS 55 31* 40  BILITOT 0.4 0.8 0.9  ALBUMIN 3.8 3.2* 3.0*   Cardiac Enzymes No results for input(s): TROPONINI, PROBNP in the last 168 hours. Glucose  Recent Labs Lab 11/22/14 0906 11/22/14 1227 11/22/14 1718  GLUCAP 102* 89 99    Imaging No results found.   ASSESSMENT / PLAN:  PULMONARY OETT 12/22>> A: Acute resp failure in setting ETOH wd refractory to usual interventions P:   OTT-> vent Vent  bundle BD as needed Sedation as needed(yhis may be the greater challenge)  CARDIOVASCULAR CVLrt i j cvl 12/18>> A:  PVD Post ABF bypass 12/18 HTN P:  PVD  Per Vascular Antihypertensives as needed  RENAL Lab Results  Component Value Date   CREATININE 1.07 11/24/2014   CREATININE 0.82 11/23/2014   CREATININE 0.84 11/22/2014    A:   No acute issue P:     GASTROINTESTINAL A:   GI protection P:   PPI Start tube feeds if intubate > 24 hours  HEMATOLOGIC A:   No acute issue P:    INFECTIOUS A:   No acute issue P:     ENDOCRINE A:   No acute issues P:     NEUROLOGIC A:   Extreme agitation form presumed etoh withdrawal Schizophrenia  P:   RASS goal: -1 Extremely agitate prior to intubation Sedate with diprivan and fentanyl Add folic  acid and thiamine IV Psych to see pt  Lithium level Hold psychotropics for now   FAMILY  - Updates:   - Inter-disciplinary family meet or Palliative Care meeting due by:  day 7    TODAY'S SUMMARY:  12/22 called to bedside for acute etoh withdrawal, AMS, tachycardia and extreme agitation refractory to benzodiazepines. [name] is a 53 yo WM with known PVD who underwent  Aorta bifemoral bypass 12/18 per Dr. Kellie Simmering. He has a known hx of ETOH abuse, schizophrenia and non compliance. He will be intubated and transferred to ICU. We will check trop I, abg,  c x r for completeness and continue treatment for etoh wd.   [name] ACNP [facility] Shape PCCM Pager [phone] till 3 pm If no answer page [phone] 11/24/2014, 2:02 PM  Patient is in respiratory failure, diffuse crackles.  Will intubate.  Full vent support.  Alcohol withdrawal is the most likely culprit here with delirium.  Atelectasis resolved post intubation as one would expect.  Thiamine/folate/MVI ordered.  Propofol should assist with withdrawal symptoms.  Will likely need psych assistance post extubation once ready as suspect his psych medications will need  serious adjustment.  Will transfer to the ICU and monitor.  The patient is critically ill with multiple organ systems failure and requires high complexity decision making for assessment and support, frequent evaluation and titration of therapies, application of advanced monitoring technologies and extensive interpretation of multiple databases.   Critical Care Time devoted to patient care services described in this note is  45  Minutes. This time reflects time of care of this signee Dr Jennet Maduro. This critical care time does not reflect procedure time, or teaching time or supervisory time of PA/NP/Med student/Med Resident etc but could involve care discussion time.  Rush Farmer, M.D. [facility] Pulmonary/Critical Care Medicine. Pager: [phone]. After hours pager: [phone].

## 2014-11-25 ENCOUNTER — Inpatient Hospital Stay (HOSPITAL_COMMUNITY): Payer: Medicaid Other

## 2014-11-25 LAB — GLUCOSE, CAPILLARY
Glucose-Capillary: 67 mg/dL — ABNORMAL LOW (ref 70–99)
Glucose-Capillary: 94 mg/dL (ref 70–99)
Glucose-Capillary: 64 mg/dL — ABNORMAL LOW (ref 70–99)
Glucose-Capillary: 82 mg/dL (ref 70–99)
Glucose-Capillary: 85 mg/dL (ref 70–99)
Glucose-Capillary: 95 mg/dL (ref 70–99)

## 2014-11-25 LAB — BASIC METABOLIC PANEL
Anion gap: 5 (ref 5–15)
BUN: 7 mg/dL (ref 6–23)
CO2: 23 mmol/L (ref 19–32)
Calcium: 8 mg/dL — ABNORMAL LOW (ref 8.4–10.5)
Chloride: 112 mEq/L (ref 96–112)
Creatinine, Ser: 0.91 mg/dL (ref 0.50–1.35)
GFR calc Af Amer: >90 mL/min (ref >90)
GFR calc non Af Amer: >90 mL/min (ref >90)
Glucose, Bld: 73 mg/dL (ref 70–99)
Potassium: 3.9 mmol/L (ref 3.5–5.1)
Sodium: 140 mmol/L (ref 135–145)

## 2014-11-25 LAB — TRIGLYCERIDES: Triglycerides: 143 mg/dL (ref <150)

## 2014-11-25 LAB — CBC
HCT: 24.3 % — ABNORMAL LOW (ref 39.0–52.0)
Hemoglobin: 8 g/dL — ABNORMAL LOW (ref 13.0–17.0)
MCH: 32.7 pg (ref 26.0–34.0)
MCHC: 32.9 g/dL (ref 30.0–36.0)
MCV: 99.2 fL (ref 78.0–100.0)
Platelets: 133 10*3/uL — ABNORMAL LOW (ref 150–400)
RBC: 2.45 MIL/uL — ABNORMAL LOW (ref 4.22–5.81)
RDW: 13.3 % (ref 11.5–15.5)
WBC: 5.4 10*3/uL (ref 4.0–10.5)

## 2014-11-25 LAB — PHOSPHORUS: Phosphorus: 2.8 mg/dL (ref 2.3–4.6)

## 2014-11-25 LAB — MAGNESIUM: Magnesium: 2 mg/dL (ref 1.5–2.5)

## 2014-11-25 LAB — TROPONIN I: Troponin I: <0.03 ng/mL (ref <0.031)

## 2014-11-25 MED ORDER — PRO-STAT SUGAR FREE PO LIQD
30.0000 mL | Freq: Two times a day (BID) | ORAL | Status: DC
  Administered 2014-11-25 – 2014-11-28 (×8): 30 mL
  Filled 2014-11-25 (×10): qty 30

## 2014-11-25 MED ORDER — DEXTROSE 50 % IV SOLN
INTRAVENOUS | Status: AC
  Administered 2014-11-25: 25 mL
  Filled 2014-11-25: qty 50

## 2014-11-25 MED ORDER — SODIUM CHLORIDE 0.9 % IV SOLN
INTRAVENOUS | Status: DC
  Administered 2014-11-25 – 2014-11-26 (×2): via INTRAVENOUS
  Administered 2014-11-27: 20 mL/h via INTRAVENOUS
  Administered 2014-12-01: 04:00:00 via INTRAVENOUS

## 2014-11-25 MED ORDER — VITAL AF 1.2 CAL PO LIQD
1000.0000 mL | ORAL | Status: DC
  Administered 2014-11-25 – 2014-11-28 (×4): 1000 mL
  Filled 2014-11-25 (×9): qty 1000

## 2014-11-25 MED ORDER — VALPROIC ACID 250 MG/5ML PO SYRP
500.0000 mg | ORAL_SOLUTION | Freq: Three times a day (TID) | ORAL | Status: DC
  Administered 2014-11-25 – 2014-11-28 (×12): 500 mg
  Filled 2014-11-25 (×15): qty 10

## 2014-11-25 MED ORDER — DOCUSATE SODIUM 50 MG/5ML PO LIQD
100.0000 mg | Freq: Every day | ORAL | Status: DC
  Administered 2014-11-25 – 2014-11-28 (×4): 100 mg
  Filled 2014-11-25 (×5): qty 10

## 2014-11-25 MED ORDER — FREE WATER
200.0000 mL | Freq: Three times a day (TID) | Status: DC
  Administered 2014-11-25 – 2014-11-29 (×11): 200 mL

## 2014-11-25 MED ORDER — VITAL HIGH PROTEIN PO LIQD
1000.0000 mL | ORAL | Status: DC
  Filled 2014-11-25 (×2): qty 1000

## 2014-11-25 MED ORDER — DEXTROSE 50 % IV SOLN
25.0000 mL | Freq: Once | INTRAVENOUS | Status: AC
  Administered 2014-11-25: 25 mL via INTRAVENOUS

## 2014-11-25 NOTE — Progress Notes (Addendum)
  AAA Progress Note    11/25/2014 7:42 AM 5 Days Post-Op  Subjective:  Intubated/sedated.  RN states that he is very restless when any type of care is given to pt overnight  Tm 101.8 now afebrile HR 50's-80's (130's yesterday during resp event) 100% .96PRF1  Gtts:   Propofol Fentanyl   Filed Vitals:   11/25/14 0720  BP:   Pulse:   Temp: 98 F (36.7 C)  Resp:     Physical Exam: Cardiac:  regular Lungs:  CTAB (much improved from yesterday morning) Abdomen:  Distended but soft; -BM past 24 hrs Incisions:  Midline incision with staples intact; bilateral groins are healing nicely-still with some ecchymosis Extremities:  2+ palpable bilateral DP pulses  CBC    Component Value Date/Time   WBC 5.4 11/25/2014 0500   WBC CANCELED 11/10/2014 1424   RBC 2.45* 11/25/2014 0500   RBC CANCELED 11/10/2014 1424   HGB 8.0* 11/25/2014 0500   HCT 24.3* 11/25/2014 0500   PLT 133* 11/25/2014 0500   MCV 99.2 11/25/2014 0500   MCH 32.7 11/25/2014 0500   MCH 34.6* 06/09/2013 1524   MCHC 32.9 11/25/2014 0500   MCHC 35.6 06/09/2013 1524   RDW 13.3 11/25/2014 0500   RDW 13.9 06/09/2013 1524   LYMPHSABS CANCELED 11/10/2014 1424   LYMPHSABS 1.8 06/01/2010 1522   MONOABS 0.6 06/01/2010 1522   EOSABS CANCELED 11/10/2014 1424   EOSABS 0.0 06/01/2010 1522   BASOSABS CANCELED 11/10/2014 1424   BASOSABS 0.0 06/01/2010 1522    BMET    Component Value Date/Time   NA 140 11/25/2014 0500   NA 137 11/10/2014 1424   K 3.9 11/25/2014 0500   CL 112 11/25/2014 0500   CO2 23 11/25/2014 0500   GLUCOSE 73 11/25/2014 0500   GLUCOSE 97 11/10/2014 1424   BUN 7 11/25/2014 0500   BUN 12 11/10/2014 1424   CREATININE 0.91 11/25/2014 0500   CALCIUM 8.0* 11/25/2014 0500   GFRNONAA >90 11/25/2014 0500   GFRAA >90 11/25/2014 0500    INR    Component Value Date/Time   INR 1.23 11/20/2014 1210     Intake/Output Summary (Last 24 hours) at 11/25/14 6384 Last data filed at 11/25/14 0700  Gross  per 24 hour  Intake 2023.7 ml  Output   1125 ml  Net  898.7 ml     Assessment/Plan:  53 y.o. male is s/p  aortobifemoral bypass grafting 5 Days Post-Op  -pt with patent bypass graft -respiratory distress yesterday that required intubation.  Event most likely DT's.  Appreciate CCM's assistance.  -lungs much clearer today than yesterday -since pt will be intubated for a day or two, will need to start tube feeds for nutrition(pt has OGT)-will order a nutrition consult. -dulcolax supp this am -will discontinue Vyvanse, Wellbutrin, Lithium and Xanax per psych.  Appreciate their assistance with this pt.   [name], PA-C [facility] [phone] 11/25/2014 7:42 AM  Agree with above assessment Remains sedated on ventilator with propofol Abdomen relatively soft with nicely healing midline abdominal wound and inguinal wounds. 3+ dorsalis pedis pulse palpable bilaterally. Plan tube feedings per nutritional service Continue supportive care Appreciate CCM's and Psych's assistance

## 2014-11-25 NOTE — Progress Notes (Signed)
INITIAL NUTRITION ASSESSMENT  DOCUMENTATION CODES Per approved criteria  -Not Applicable   INTERVENTION:  Initiate TF via OGT with Vital AF 1.2 at 25 ml/h, increase by 10 ml every 4 hours to goal rate of 50 ml/h with Prostat 30 ml BID to provide 1640 kcals, 120 gm protein, 973 ml free water daily.  Above TF regimen plus current kcals provided by Propofol will total 1986 kcals (100% of estimated needs).  NUTRITION DIAGNOSIS: Inadequate oral intake related to inability to eat as evidenced by NPO status.   Goal: Intake to meet >90% of estimated nutrition needs.  Monitor:  TF tolerance/adequacy, weight trend, labs, vent status.  Reason for Assessment: MD Consult for TF initiation and management.  53 y.o. male  Admitting Dx: Severe pain in left leg with ambulation  ASSESSMENT: 53yo male with hx HTN, ETOH, Hep C, schizophrenia initially admitted 12/18 for elective aorto-bifem bypass. On 12/22 he developed worsening ETOH withdrawal with severe agitation, AMS, tachycardia all refractory to benzos. Ultimately required intubation and tx ICU.   Patient is currently intubated on ventilator support MV: 7.7 L/min Temp (24hrs), Avg:99.2 F (37.3 C), Min:98 F (36.7 C), Max:101.8 F (38.8 C)  Propofol: 13.1 ml/hr providing 346 kcals per day  Height: Ht Readings from Last 1 Encounters:  11/22/14 5\' 7"  (1.702 m)    Weight: Wt Readings from Last 1 Encounters:  11/22/14 160 lb 0.9 oz (72.6 kg)    Ideal Body Weight: 67.3 kg  % Ideal Body Weight: 108%  Wt Readings from Last 10 Encounters:  11/22/14 160 lb 0.9 oz (72.6 kg)  11/17/14 156 lb (70.761 kg)  11/11/14 159 lb (72.122 kg)  11/10/14 159 lb (72.122 kg)  03/13/14 165 lb (74.844 kg)  09/12/13 163 lb (73.936 kg)  06/09/13 161 lb (73.029 kg)  05/16/13 162 lb 8 oz (73.71 kg)  05/14/13 160 lb (72.576 kg)  02/10/13 165 lb (74.844 kg)    Usual Body Weight: 159-165 lbs  % Usual Body Weight: 100%  BMI:  Body mass index is  25.06 kg/(m^2).  Estimated Nutritional Needs: Kcal: 1974 Protein: 100-120 gm Fluid: 2 L  Skin: surgical incisions  Diet Order:  NPO  EDUCATION NEEDS: -Education not appropriate at this time   Intake/Output Summary (Last 24 hours) at 11/25/14 1036 Last data filed at 11/25/14 0900  Gross per 24 hour  Intake 2304.8 ml  Output   1295 ml  Net 1009.8 ml    Last BM: 12/21   Labs:   Recent Labs Lab 11/20/14 1140 11/21/14 0430  11/23/14 0400 11/24/14 1205 11/25/14 0500  NA 135* 138  < > 138 138 140  K 4.9 4.8  < > 4.1 4.5 3.9  CL 102 107  < > 106 109 112  CO2 26 22  < > 22 20 23   BUN 9 9  < > 4* 5* 7  CREATININE 0.85 0.86  < > 0.82 1.07 0.91  CALCIUM 8.4 8.0*  < > 8.6 9.1 8.0*  MG 2.1 2.2  --   --   --  2.0  PHOS  --   --   --   --   --  2.8  GLUCOSE 118* 130*  < > 81 108* 73  < > = values in this interval not displayed.  CBG (last 3)   Recent Labs  11/22/14 1718 11/25/14 0717 11/25/14 0822  GLUCAP 99 67* 94    Scheduled Meds: . antiseptic oral rinse  7 mL Mouth Rinse QID  .  ARIPiprazole  20 mg Oral QHS  . aspirin  81 mg Oral Daily  . chlorhexidine  15 mL Mouth Rinse BID  . docusate  100 mg Per Tube Daily  . enoxaparin (LOVENOX) injection  30 mg Subcutaneous Q24H  . feeding supplement (VITAL HIGH PROTEIN)  1,000 mL Per Tube Q24H  . folic acid  1 mg Intravenous Daily  . pantoprazole  40 mg Oral Daily   Or  . pantoprazole (PROTONIX) IV  40 mg Intravenous Daily  . tamsulosin  0.4 mg Oral Daily  . thiamine IV  100 mg Intravenous Daily  . Valproic Acid  500 mg Per Tube TID    Continuous Infusions: . sodium chloride 100 mL/hr at 11/24/14 1410  . fentaNYL infusion INTRAVENOUS 25 mcg/hr (11/25/14 0900)  . propofol 30 mcg/kg/min (11/25/14 0900)    Past Medical History  Diagnosis Date  . Hypertension   . Hyperlipidemia   . Myocardial infarction 1996; 1997; 2000's    "total of 3" (10/22/2012)  . Hepatitis C     "diagnosed in the past 2 wk"  (10/22/2012)  . Seizures     "when I get anxious" (10/22/2012)  . Anxiety   . Depression   . Schizophrenia   . PAD (peripheral artery disease)     2010 PTA & stent to left CIA & left SFA; 05/2012 PTA & stent to distal LCIA; 10/2012 thrombectomy and stents to [place]  . History of ETOH abuse     quit 2007  . Tobacco abuse   . Broken fingers   . Coronary artery disease     s/p PCI to RCA at [facility]; s/p PCI to RCA '08; Cath 05/2012 showed known occluded LCx, RCA stent w/ mild restenosis, LAD no significant dz, EF 35% . Cardiac cath in July of 2014 showed no significant change.   Marland Kitchen GERD (gastroesophageal reflux disease)     takes Protonix daily  . Bipolar affective     takes Lithium daily    Past Surgical History  Procedure Laterality Date  . Subdural hematoma evacuation via craniotomy    . Anterior cervical decomp/discectomy fusion  01/2011    plate & screw placement   . Iliac artery stent  10/22/2012    "2" (10/22/2012)  . Brain surgery    . Posterior fusion cervical spine  2012  . Liver biopsy  10/2012  . Cardiac catheterization      Stent - 12  . Coronary angioplasty with stent placement  03/2007    to RCA  . Iliac artery stent  06/2012    left  . Left heart catheterization with coronary angiogram N/A 05/29/2012    Procedure: LEFT HEART CATHETERIZATION WITH CORONARY ANGIOGRAM;  Surgeon: Wellington Hampshire, MD;  Location: [facility] CATH LAB;  Service: Cardiovascular;  Laterality: N/A;  . Lower extremity angiogram N/A 05/29/2012    Procedure: LOWER EXTREMITY ANGIOGRAM;  Surgeon: Wellington Hampshire, MD;  Location: [facility] CATH LAB;  Service: Cardiovascular;  Laterality: N/A;  . Percutaneous stent intervention Left 05/29/2012    Procedure: PERCUTANEOUS STENT INTERVENTION;  Surgeon: Wellington Hampshire, MD;  Location: [place] CATH LAB;  Service: Cardiovascular;  Laterality: Left;  . Abdominal aortagram N/A 10/22/2012    Procedure: ABDOMINAL Maxcine Ham;  Surgeon: Wellington Hampshire, MD;  Location: [facility] CATH  LAB;  Service: Cardiovascular;  Laterality: N/A;  . Abdominal aortagram N/A 11/11/2014    Procedure: ABDOMINAL Maxcine Ham;  Surgeon: Wellington Hampshire, MD;  Location: [facility] CATH LAB;  Service: Cardiovascular;  Laterality: N/A;  . Aorta - bilateral femoral artery bypass graft N/A 11/20/2014    Procedure: AORTA BIFEMORAL BYPASS GRAFT;  Surgeon: Mal Misty, MD;  Location: [facility];  Service: Vascular;  Laterality: N/A;     Molli Barrows, RD, LDN, [place] Pager [phone] After Hours Pager [phone]

## 2014-11-25 NOTE — Clinical Social Work Note (Signed)
Attempted to see patient, but patient was still intubated.  Will follow up at a later time once patient is no longer intubated.  [name]. [place], MSW, [place] 11/25/2014 4:35 PM

## 2014-11-25 NOTE — Progress Notes (Signed)
PT Cancellation Note  Patient Details Name: TAL KEMPKER MRN: [id number] DOB: [birth year]   Cancelled Treatment:    Reason Eval/Treat Not Completed: Medical issues which prohibited therapy Patient intubated and sedated. Will follow up when patient is medically ready.  Ellouise Newer 11/25/2014, 11:52 AM Elayne Snare, [place]

## 2014-11-25 NOTE — Progress Notes (Signed)
PULMONARY / CRITICAL CARE MEDICINE   Name: [name] MRN: [id number] DOB: 12-18-1960    ADMISSION DATE:  11/20/2014 CONSULTATION DATE: 12/22  REFERRING MD :  Kellie Simmering  CHIEF COMPLAINT:  Agitation  INITIAL PRESENTATION:  53yo male with hx HTN, ETOH, Hep C, schizophrenia initially admitted 12/18 for elective aorto-bifem bypass.  Was in SDU post op when on 12/22 he developed worsening ETOH withdrawal with severe agitation, AMS< tachycardia all refractory to benzos. Ultimately required intubation and tx ICU.   STUDIES:    SIGNIFICANT EVENTS: 12/22 intubated    SUBJECTIVE:  No acute change overnight.  Remains VERY easily agitated with minimal stimulation.  On propofol gtt.   VITAL SIGNS: Temp:  [98 F (36.7 C)-101.8 F (38.8 C)] 98 F (36.7 C) (12/23 0720) Pulse Rate:  [56-170] 65 (12/23 0900) Resp:  [13-34] 14 (12/23 0900) BP: (84-157)/(46-125) 98/57 mmHg (12/23 0900) SpO2:  [96 %-100 %] 100 % (12/23 0900) FiO2 (%):  [40 %-100 %] 40 % (12/23 0800) HEMODYNAMICS:   VENTILATOR SETTINGS: Vent Mode:  [-] PRVC FiO2 (%):  [40 %-100 %] 40 % Set Rate:  [14 bmp] 14 bmp Vt Set:  [530 mL] 530 mL PEEP:  [5 cmH20] 5 cmH20 Plateau Pressure:  [14 cmH20] 14 cmH20 INTAKE / OUTPUT:  Intake/Output Summary (Last 24 hours) at 11/25/14 0948 Last data filed at 11/25/14 0900  Gross per 24 hour  Intake 2304.8 ml  Output   1295 ml  Net 1009.8 ml    PHYSICAL EXAMINATION: General:  Sedated on vent, NAD  Neuro:  Sedated, RASS -1 but QUICKLY agitated with any stimulation  HEENT: No JVD/LAN, ETT Cardiovascular: HSR RRR Lungs: resps even non labored on vent, Diminished in bases Abdomen:  Soft + bs Musculoskeletal:  Intact Skin:  Warm, diaphoretic   LABS:  CBC  Recent Labs Lab 11/23/14 0400 11/24/14 1205 11/25/14 0500  WBC 6.8 9.3 5.4  HGB 8.9* 10.2* 8.0*  HCT 26.9* 31.0* 24.3*  PLT 124* 211 133*   Coag's  Recent Labs Lab 11/20/14 1210  APTT 28  INR 1.23    BMET  Recent Labs Lab 11/23/14 0400 11/24/14 1205 11/25/14 0500  NA 138 138 140  K 4.1 4.5 3.9  CL 106 109 112  CO2 22 20 23   BUN 4* 5* 7  CREATININE 0.82 1.07 0.91  GLUCOSE 81 108* 73   Electrolytes  Recent Labs Lab 11/20/14 1140 11/21/14 0430  11/23/14 0400 11/24/14 1205 11/25/14 0500  CALCIUM 8.4 8.0*  < > 8.6 9.1 8.0*  MG 2.1 2.2  --   --   --  2.0  PHOS  --   --   --   --   --  2.8  < > = values in this interval not displayed. Sepsis Markers No results for input(s): LATICACIDVEN, PROCALCITON, O2SATVEN in the last 168 hours. ABG  Recent Labs Lab 11/21/14 0506 11/24/14 1150 11/24/14 1347  PHART 7.446 7.397 7.284*  PCO2ART 28.3* 27.7* 40.3  PO2ART 94.0 96.1 50.0*   Liver Enzymes  Recent Labs Lab 11/21/14 0430 11/24/14 1205  AST 17 53*  ALT 7 19  ALKPHOS 31* 40  BILITOT 0.8 0.9  ALBUMIN 3.2* 3.0*   Cardiac Enzymes  Recent Labs Lab 11/24/14 1409 11/24/14 2020 11/25/14 0230  TROPONINI <0.03 <0.03 <0.03   Glucose  Recent Labs Lab 11/22/14 0906 11/22/14 1227 11/22/14 1718 11/25/14 0717 11/25/14 0822  GLUCAP 102* 89 99 67* 94    Imaging Dg Chest Port 1  View  11/24/2014   CLINICAL DATA:  Endotracheal tube placement.  Reintubation  EXAM: PORTABLE CHEST - 1 VIEW  COMPARISON:  11/24/2014  FINDINGS: Endotracheal tube in good position. Right jugular catheter tip in the SVC. NG tube in the stomach.  Bibasilar atelectasis similar to earlier today.  Negative for edema.  IMPRESSION: Endotracheal tube in good position.  Bibasilar atelectasis unchanged.   Electronically Signed   By: Franchot Gallo M.D.   On: 11/24/2014 13:42   Dg Chest Port 1 View  11/24/2014   CLINICAL DATA:  53 year old follow-up rhonchi  EXAM: PORTABLE CHEST - 1 VIEW  COMPARISON:  11/21/2014  FINDINGS: A right internal jugular central venous catheter tip projects over the region of the distal SVC. Cervical fixation hardware is present. There has been interval removal of an  enteric tube.  The lung volumes are diminished. The cardiac silhouette and mediastinal contours are unchanged. Increasing bibasilar opacities are present, left greater than right. There is no pleural effusion or pneumothorax. There is no overt pulmonary edema.  There is no acute osseous abnormality.  IMPRESSION: Hypoinflation with associated bibasilar opacities, atelectasis versus developing pneumonitis.   Electronically Signed   By: Rosemarie Ax   On: 11/24/2014 09:28     ASSESSMENT / PLAN:  PULMONARY OETT 12/22>> A: Acute resp failure in setting ETOH wd refractory to usual interventions P:   Cont vent support  Vent bundle Daily WUA, SBT  BD PRN  F/u CXR    CARDIOVASCULAR CVLrt i j cvl 12/18>> A:  PVD Post ABF bypass 12/18 HTN Bradycardia  Troponin NEG P:  PVD  Per Vascular D/c metoprolol with bradycardia  Hold home anti-HTN with borderline BP  PRN hydralazine   RENAL A:   No acute issue P:   F/u chem    GASTROINTESTINAL A:   GI protection P:   PPI Start tube feeds  HEMATOLOGIC A:   No acute issue P:  F/u cbc   INFECTIOUS A:   No acute issue P:   Monitor wbc, fever curve off abx   ENDOCRINE A:   No acute issues P:   Follow glucose with TF   NEUROLOGIC A:   Extreme agitation form presumed etoh withdrawal Schizophrenia  P:   RASS goal: -1  folic acid and thiamine IV Cont propofol and fent gtt  Cont Ativan per CIWA protocol  Daily WUA  Cont depakote, abilify per psych  Psych following    FAMILY  - Updates: no family available 12/23  - Inter-disciplinary family meet or Palliative Care meeting due by:  12/29    Nickolas Madrid, NP 11/25/2014  9:48 AM Pager: [phone] or [phone]   PCCM ATTENDING: Pt seen on work rounds with care provider noted above. We reviewed pt's initial presentation, consultants notes and hospital database in detail.  The above assessment and plan was formulated under my direction.  Merton Border, MD;  PCCM service; Mobile [phone]

## 2014-11-26 ENCOUNTER — Inpatient Hospital Stay (HOSPITAL_COMMUNITY): Payer: Medicaid Other

## 2014-11-26 LAB — CBC
HCT: 23.5 % — ABNORMAL LOW (ref 39.0–52.0)
Hemoglobin: 7.6 g/dL — ABNORMAL LOW (ref 13.0–17.0)
MCH: 31.7 pg (ref 26.0–34.0)
MCHC: 32.3 g/dL (ref 30.0–36.0)
MCV: 97.9 fL (ref 78.0–100.0)
Platelets: 149 10*3/uL — ABNORMAL LOW (ref 150–400)
RBC: 2.4 MIL/uL — ABNORMAL LOW (ref 4.22–5.81)
RDW: 13.2 % (ref 11.5–15.5)
WBC: 5.6 10*3/uL (ref 4.0–10.5)

## 2014-11-26 LAB — GLUCOSE, CAPILLARY
Glucose-Capillary: 100 mg/dL — ABNORMAL HIGH (ref 70–99)
Glucose-Capillary: 109 mg/dL — ABNORMAL HIGH (ref 70–99)
Glucose-Capillary: 111 mg/dL — ABNORMAL HIGH (ref 70–99)
Glucose-Capillary: 112 mg/dL — ABNORMAL HIGH (ref 70–99)
Glucose-Capillary: 115 mg/dL — ABNORMAL HIGH (ref 70–99)
Glucose-Capillary: 149 mg/dL — ABNORMAL HIGH (ref 70–99)
Glucose-Capillary: 99 mg/dL (ref 70–99)

## 2014-11-26 LAB — BASIC METABOLIC PANEL
Anion gap: 4 — ABNORMAL LOW (ref 5–15)
BUN: 6 mg/dL (ref 6–23)
CO2: 25 mmol/L (ref 19–32)
Calcium: 8.1 mg/dL — ABNORMAL LOW (ref 8.4–10.5)
Chloride: 110 mEq/L (ref 96–112)
Creatinine, Ser: 0.87 mg/dL (ref 0.50–1.35)
GFR calc Af Amer: >90 mL/min (ref >90)
GFR calc non Af Amer: >90 mL/min (ref >90)
Glucose, Bld: 109 mg/dL — ABNORMAL HIGH (ref 70–99)
Potassium: 3.7 mmol/L (ref 3.5–5.1)
Sodium: 139 mmol/L (ref 135–145)

## 2014-11-26 LAB — MAGNESIUM: Magnesium: 1.9 mg/dL (ref 1.5–2.5)

## 2014-11-26 LAB — PHOSPHORUS: Phosphorus: 3.7 mg/dL (ref 2.3–4.6)

## 2014-11-26 MED ORDER — ALUM & MAG HYDROXIDE-SIMETH 200-200-20 MG/5ML PO SUSP: 15.0000 mL | ORAL | Status: DC | PRN

## 2014-11-26 MED ORDER — FUROSEMIDE 10 MG/ML IJ SOLN
20.0000 mg | Freq: Once | INTRAMUSCULAR | Status: AC
  Administered 2014-11-26: 20 mg via INTRAVENOUS
  Filled 2014-11-26: qty 2

## 2014-11-26 MED ORDER — CEFTRIAXONE SODIUM IN DEXTROSE 20 MG/ML IV SOLN
1.0000 g | INTRAVENOUS | Status: DC
  Administered 2014-11-26 – 2014-11-30 (×5): 1 g via INTRAVENOUS
  Filled 2014-11-26 (×8): qty 50

## 2014-11-26 MED ORDER — QUETIAPINE FUMARATE 100 MG PO TABS
100.0000 mg | ORAL_TABLET | Freq: Two times a day (BID) | ORAL | Status: DC
  Administered 2014-11-26 – 2014-11-27 (×4): 100 mg
  Filled 2014-11-26 (×6): qty 1

## 2014-11-26 MED ORDER — ARIPIPRAZOLE 10 MG PO TABS
20.0000 mg | ORAL_TABLET | Freq: Every day | ORAL | Status: DC
  Administered 2014-11-26 – 2014-11-28 (×3): 20 mg
  Filled 2014-11-26 (×4): qty 2

## 2014-11-26 MED ORDER — SODIUM CHLORIDE 0.9 % IV SOLN
Freq: Once | INTRAVENOUS | Status: AC
  Administered 2014-11-26: 10 mL/h via INTRAVENOUS

## 2014-11-26 MED ORDER — ALPRAZOLAM 0.5 MG PO TABS
1.0000 mg | ORAL_TABLET | Freq: Three times a day (TID) | ORAL | Status: DC
  Administered 2014-11-26 – 2014-11-29 (×10): 1 mg
  Filled 2014-11-26 (×10): qty 2

## 2014-11-26 MED ORDER — PANTOPRAZOLE SODIUM 40 MG PO PACK
40.0000 mg | PACK | Freq: Every day | ORAL | Status: DC
  Administered 2014-11-26 – 2014-11-28 (×3): 40 mg
  Filled 2014-11-26 (×4): qty 20

## 2014-11-26 MED ORDER — DEXMEDETOMIDINE HCL IN NACL 200 MCG/50ML IV SOLN
0.4000 ug/kg/h | INTRAVENOUS | Status: DC
  Administered 2014-11-26: 0.7 ug/kg/h via INTRAVENOUS
  Administered 2014-11-26: 0.5 ug/kg/h via INTRAVENOUS
  Administered 2014-11-26: 0.9 ug/kg/h via INTRAVENOUS
  Filled 2014-11-26 (×3): qty 50

## 2014-11-26 MED ORDER — HALOPERIDOL LACTATE 5 MG/ML IJ SOLN
1.0000 mg | INTRAMUSCULAR | Status: DC | PRN
  Administered 2014-11-27: 4 mg via INTRAVENOUS
  Filled 2014-11-26: qty 0.8
  Filled 2014-11-26: qty 1

## 2014-11-26 MED ORDER — LORAZEPAM 2 MG/ML IJ SOLN: 0.5000 mg | INTRAMUSCULAR | Status: DC | PRN

## 2014-11-26 MED ORDER — FENTANYL CITRATE 0.05 MG/ML IJ SOLN
25.0000 ug | INTRAMUSCULAR | Status: DC | PRN
  Administered 2014-11-26 (×2): 100 ug via INTRAVENOUS
  Filled 2014-11-26 (×2): qty 2

## 2014-11-26 MED ORDER — DEXMEDETOMIDINE HCL IN NACL 400 MCG/100ML IV SOLN
0.4000 ug/kg/h | INTRAVENOUS | Status: DC
  Administered 2014-11-26: 0.9 ug/kg/h via INTRAVENOUS
  Administered 2014-11-27: 0.7 ug/kg/h via INTRAVENOUS
  Administered 2014-11-27: 0.5 ug/kg/h via INTRAVENOUS
  Administered 2014-11-27: 0.7 ug/kg/h via INTRAVENOUS
  Administered 2014-11-28: 0.4 ug/kg/h via INTRAVENOUS
  Administered 2014-11-28: 0.3 ug/kg/h via INTRAVENOUS
  Administered 2014-11-29: 0.7 ug/kg/h via INTRAVENOUS
  Filled 2014-11-26 (×7): qty 100

## 2014-11-26 NOTE — Progress Notes (Addendum)
  AAA Progress Note    11/26/2014 9:01 AM 6 Days Post-Op  Subjective:  Intubated-agitated with exam  Tm 100.4 now 99.4 HR 60's-70's NSR 82'C-003'K systolic (91'P-91'T this am) 100% .05WPV9  Gtts: Propofol Fentanyl TF  Filed Vitals:   11/26/14 0800  BP: 88/46  Pulse: 69  Temp:   Resp: 16    Physical Exam: Cardiac:  regular Abdomen:  Soft ND, BM on 11/23/14.  Pt with BS. Incisions:  Midline incision is c/d/i with staples in tact; bilateral groins are soft without hematoma.  Still with some ecchymosis Extremities:  2+ palpable DP pulse bilaterally  CBC    Component Value Date/Time   WBC 5.6 11/26/2014 0400   WBC CANCELED 11/10/2014 1424   RBC 2.40* 11/26/2014 0400   RBC CANCELED 11/10/2014 1424   HGB 7.6* 11/26/2014 0400   HCT 23.5* 11/26/2014 0400   PLT 149* 11/26/2014 0400   MCV 97.9 11/26/2014 0400   MCH 31.7 11/26/2014 0400   MCH 34.6* 06/09/2013 1524   MCHC 32.3 11/26/2014 0400   MCHC 35.6 06/09/2013 1524   RDW 13.2 11/26/2014 0400   RDW 13.9 06/09/2013 1524   LYMPHSABS CANCELED 11/10/2014 1424   LYMPHSABS 1.8 06/01/2010 1522   MONOABS 0.6 06/01/2010 1522   EOSABS CANCELED 11/10/2014 1424   EOSABS 0.0 06/01/2010 1522   BASOSABS CANCELED 11/10/2014 1424   BASOSABS 0.0 06/01/2010 1522    BMET    Component Value Date/Time   NA 139 11/26/2014 0400   NA 137 11/10/2014 1424   K 3.7 11/26/2014 0400   CL 110 11/26/2014 0400   CO2 25 11/26/2014 0400   GLUCOSE 109* 11/26/2014 0400   GLUCOSE 97 11/10/2014 1424   BUN 6 11/26/2014 0400   BUN 12 11/10/2014 1424   CREATININE 0.87 11/26/2014 0400   CALCIUM 8.1* 11/26/2014 0400   GFRNONAA >90 11/26/2014 0400   GFRAA >90 11/26/2014 0400    INR    Component Value Date/Time   INR 1.23 11/20/2014 1210     Intake/Output Summary (Last 24 hours) at 11/26/14 0901 Last data filed at 11/26/14 0800  Gross per 24 hour  Intake 2813.55 ml  Output    790 ml  Net 2023.55 ml     Assessment/Plan:  53 y.o.  male is s/p  Aortobifemoral bypass grafting 6 Days Post-Op  -Hgb drop to 7.6 from 8 yesterday-BP has been soft-in 2014, pt had EF of 40%.  Will transfuse 1 unit PRBC's. -pt with patent bypass graft with palpable pedal pulses -possible Xanax withdrawal on top of Etoh withdrawal per CCM.  Pt started back on Xanax and started on Seroquel.  -continue vent management per CCM -continue TF for nutrition   Philip Locket, PA-C [facility] [phone] 11/26/2014 9:01 AM  Agree with above assessment Abdominal incision and inguinal wounds healing nicely 3+ dorsalis pedis pulse palpable bilaterally Remains agitated on ventilator due to Xanax and/or alcohol withdrawal Appreciate CCM's assistance

## 2014-11-26 NOTE — Progress Notes (Signed)
OT Cancellation Note  Patient Details Name: [name] MRN: [id number] DOB: 07-Oct-1961   Cancelled Treatment:    Reason Eval/Treat Not Completed: Medical issues which prohibited therapy Pt intubated, not ready for therapy today.   Darlina Rumpf [place], OTR/L [phone]  11/26/2014, 10:26 AM

## 2014-11-26 NOTE — Clinical Social Work Note (Signed)
Patient still intubated was not able to assess patient.  Will ask weekend CSW to check on patient.  [name]. [place], MSW, [place] 11/26/2014 3:19 PM

## 2014-11-26 NOTE — Progress Notes (Signed)
Fentanyl 35ml flushed down sink witnessed by Loma Newton, RN

## 2014-11-26 NOTE — Progress Notes (Signed)
PULMONARY / CRITICAL CARE MEDICINE   Name: [name] MRN: [id number] DOB: 06/26/1961    ADMISSION DATE:  11/20/2014 CONSULTATION DATE: 12/22  REFERRING MD :  Kellie Simmering  CHIEF COMPLAINT:  Agitation  INITIAL PRESENTATION:  52yo male with hx HTN, ETOH, Hep C, schizophrenia initially admitted 12/18 for elective aorto-bifem bypass.  Was in SDU post op when on 12/22 he developed worsening ETOH withdrawal with severe agitation, AMS< tachycardia all refractory to benzos. Ultimately required intubation and tx ICU.   STUDIES:    SIGNIFICANT EVENTS: 12/22 intubated    SUBJECTIVE:   Remains very agitated on WUA. Therefore no formal SBT performed  VITAL SIGNS: Temp:  [98 F (36.7 C)-100.4 F (38 C)] 99.8 F (37.7 C) (12/24 1100) Pulse Rate:  [57-88] 71 (12/24 1100) Resp:  [14-20] 20 (12/24 1100) BP: (76-120)/(35-61) 99/57 mmHg (12/24 1100) SpO2:  [94 %-100 %] 100 % (12/24 1100) FiO2 (%):  [40 %] 40 % (12/24 1206) Weight:  [75.8 kg (167 lb 1.7 oz)-76.4 kg (168 lb 6.9 oz)] 75.8 kg (167 lb 1.7 oz) (12/24 0406) HEMODYNAMICS:   VENTILATOR SETTINGS: Vent Mode:  [-] PSV;CPAP FiO2 (%):  [40 %] 40 % Set Rate:  [14 bmp] 14 bmp Vt Set:  [530 mL] 530 mL PEEP:  [5 cmH20] 5 cmH20 Pressure Support:  [10 cmH20] 10 cmH20 Plateau Pressure:  [0 JSH70-26 cmH20] 0 cmH20 INTAKE / OUTPUT:  Intake/Output Summary (Last 24 hours) at 11/26/14 1209 Last data filed at 11/26/14 1100  Gross per 24 hour  Intake 2774.35 ml  Output    785 ml  Net 1989.35 ml    PHYSICAL EXAMINATION: General:  Intermittently agitated, not F/C on propofol and fent infusions Neuro:  RASS -3 to + 2, MAEs HEENT: No JVD/LAN, ETT Cardiovascular: reg, no M Lungs: clear anteriorly Abdomen:  Soft + bs Ext: warm, no edema   LABS: I have reviewed all of today's lab results. Relevant abnormalities are discussed in the A/P section   CXR: NNF   ASSESSMENT / PLAN:  PULMONARY OETT 12/22>> A: Acute resp failure, VDRF  due to severe agitated delirium P:   Cont vent support - settings reviewed and/or adjusted Wean in PSV as tolerated Cont vent bundle Daily SBT if/when meets criteria Anticipate extubation once delirium sufficiently improves  CARDIOVASCULAR L IJ CVL 12/18 >>  A:  PVD Post ABF bypass 12/18 HTN Troponin NEG P:  Post op mgmt per VVS Cont current med rx  RENAL A:   No acute issue P:   Monitor BMET intermittently Monitor I/Os Correct electrolytes as indicated    GASTROINTESTINAL A:   No issues P:   SUP: PPI Cont TFs  HEMATOLOGIC A:   ICU associated anemia P:  PRBCs ordered by VVS 12/24 DVT px: LMWH Monitor CBC intermittently Transfuse per usual ICU guidelines  INFECTIOUS A:   No acute issue P:   Monitor wbc, fever curve off abx   ENDOCRINE A:   No acute issues P:   Follow glucose with TF   NEUROLOGIC A:   H/O EtOH abuse Large dose of alprazolam chronically PTA Schizophrenia Suspect DTs (from EtOH and/or alprazolam) P:   RASS goal: -1 Resume Xanax 12/24 Begin seroquel 12/24 Cont Abilify Change fentanyl to PRN Transition from propofol to dexmedetomidine Cont PRN lorazepam Cont folic acid and thiamine IV    Discussed with Dr Kellie Simmering   35 mins CCM time  Merton Border, MD;  PCCM service; [place] [phone]

## 2014-11-26 NOTE — Progress Notes (Signed)
PT Cancellation Note  Patient Details Name: [name] MRN: [id number] DOB: Aug 20, 1961   Cancelled Treatment:    Reason Eval/Treat Not Completed: Medical issues which prohibited therapy Patient remains VDRF due to severe agitated delirium. Will follow up when pt able to tolerate physical therapy.  Ellouise Newer 11/26/2014, 2:34 PM Camille Bal [place], [place]

## 2014-11-27 DIAGNOSIS — F3170 Bipolar disorder, currently in remission, most recent episode unspecified: Secondary | ICD-10-CM

## 2014-11-27 DIAGNOSIS — J9601 Acute respiratory failure with hypoxia: Secondary | ICD-10-CM

## 2014-11-27 DIAGNOSIS — F10231 Alcohol dependence with withdrawal delirium: Secondary | ICD-10-CM

## 2014-11-27 LAB — GLUCOSE, CAPILLARY
Glucose-Capillary: 138 mg/dL — ABNORMAL HIGH (ref 70–99)
Glucose-Capillary: 117 mg/dL — ABNORMAL HIGH (ref 70–99)
Glucose-Capillary: 121 mg/dL — ABNORMAL HIGH (ref 70–99)
Glucose-Capillary: 152 mg/dL — ABNORMAL HIGH (ref 70–99)

## 2014-11-27 LAB — COMPREHENSIVE METABOLIC PANEL
ALT: 19 U/L (ref 0–53)
AST: 26 U/L (ref 0–37)
Albumin: 2 g/dL — ABNORMAL LOW (ref 3.5–5.2)
Alkaline Phosphatase: 39 U/L (ref 39–117)
Anion gap: 6 (ref 5–15)
BUN: 10 mg/dL (ref 6–23)
CO2: 28 mmol/L (ref 19–32)
Calcium: 8.3 mg/dL — ABNORMAL LOW (ref 8.4–10.5)
Chloride: 107 mEq/L (ref 96–112)
Creatinine, Ser: 0.77 mg/dL (ref 0.50–1.35)
GFR calc Af Amer: >90 mL/min (ref >90)
GFR calc non Af Amer: >90 mL/min (ref >90)
Glucose, Bld: 139 mg/dL — ABNORMAL HIGH (ref 70–99)
Potassium: 3.4 mmol/L — ABNORMAL LOW (ref 3.5–5.1)
Sodium: 141 mmol/L (ref 135–145)
Total Bilirubin: 0.6 mg/dL (ref 0.3–1.2)
Total Protein: 5 g/dL — ABNORMAL LOW (ref 6.0–8.3)

## 2014-11-27 MED ORDER — POTASSIUM CHLORIDE 20 MEQ/15ML (10%) PO SOLN
40.0000 meq | Freq: Once | ORAL | Status: AC
  Administered 2014-11-27: 40 meq
  Filled 2014-11-27: qty 30

## 2014-11-27 MED ORDER — FUROSEMIDE 10 MG/ML IJ SOLN
40.0000 mg | Freq: Once | INTRAMUSCULAR | Status: AC
  Administered 2014-11-27: 40 mg via INTRAVENOUS
  Filled 2014-11-27: qty 4

## 2014-11-27 MED ORDER — POTASSIUM CHLORIDE 20 MEQ/15ML (10%) PO SOLN
40.0000 meq | Freq: Once | ORAL | Status: AC
  Administered 2014-11-27: 40 meq
  Filled 2014-11-27 (×2): qty 30

## 2014-11-27 NOTE — Progress Notes (Addendum)
PULMONARY / CRITICAL CARE MEDICINE   Name: [name] MRN: [id number] DOB: June 24, 1961    ADMISSION DATE:  11/20/2014 CONSULTATION DATE: 12/22  REFERRING MD :  Kellie Simmering  CHIEF COMPLAINT:  Agitation  INITIAL PRESENTATION:  53yo male with hx HTN, ETOH, Hep C, schizophrenia initially admitted 12/18 for elective aorto-bifem bypass.  Was in SDU post op when on 12/22 he developed worsening ETOH withdrawal with severe agitation, AMS< tachycardia all refractory to benzos. Ultimately required intubation and tx ICU.   STUDIES:    SIGNIFICANT EVENTS: 12/22 intubated    SUBJECTIVE:   Remains int  agitated on WUA. Febrile Good UO, pos balance  VITAL SIGNS: Temp:  [99.4 F (37.4 C)-102.7 F (39.3 C)] 101.6 F (38.7 C) (12/25 0711) Pulse Rate:  [62-90] 62 (12/25 0700) Resp:  [16-32] 17 (12/25 0700) BP: (89-152)/(42-73) 115/66 mmHg (12/25 0700) SpO2:  [98 %-100 %] 100 % (12/25 0700) FiO2 (%):  [40 %] 40 % (12/25 0045) Weight:  [75.1 kg (165 lb 9.1 oz)] 75.1 kg (165 lb 9.1 oz) (12/25 0412) HEMODYNAMICS:   VENTILATOR SETTINGS: Vent Mode:  [-] PRVC FiO2 (%):  [40 %] 40 % Set Rate:  [14 bmp] 14 bmp Vt Set:  [530 mL] 530 mL PEEP:  [5 cmH20] 5 cmH20 Pressure Support:  [10 cmH20] 10 cmH20 Plateau Pressure:  [0 cmH20] 0 cmH20 INTAKE / OUTPUT:  Intake/Output Summary (Last 24 hours) at 11/27/14 9371 Last data filed at 11/27/14 0751  Gross per 24 hour  Intake 2990.01 ml  Output   1565 ml  Net 1425.01 ml    PHYSICAL EXAMINATION: General:  Intermittently agitated, not F/C on propofol and fent infusions Neuro:  RASS -3 to + 2, MAEs HEENT: No JVD/LAN, ETT Cardiovascular: reg, no M Lungs: clear anteriorly Abdomen:  Soft + bs Ext: warm, no edema   LABS: I have reviewed all of today's lab results. Relevant abnormalities are discussed in the A/P section   CXR: NNF   ASSESSMENT / PLAN:  PULMONARY OETT 12/22>> A: Acute resp failure, VDRF due to severe agitated  delirium P:   Cont vent support - settings reviewed and/or adjusted Cont vent bundle Daily SBT if/when meets criteria with goal extubation once delirium sufficiently improves  CARDIOVASCULAR L IJ CVL 12/18 >>  A:  PVD Post ABF bypass 12/18 HTN Troponin NEG P:  Post op mgmt per VVS Cont current med rx  RENAL A:  hypokalemia P:   Monitor BMET intermittently Monitor I/Os Correct electrolytes as indicated    GASTROINTESTINAL A:   No issues P:   SUP: PPI Cont TFs  HEMATOLOGIC A:   ICU associated anemia -1 U PRBCs 12/24 P:  DVT px: LMWH Monitor CBC intermittently Transfuse per usual ICU guidelines  INFECTIOUS A:   No acute issue P:   Monitor wbc, fever curve off abx   ENDOCRINE A:   No acute issues P:   Follow glucose with TF   NEUROLOGIC A:   H/O EtOH abuse Large dose of alprazolam chronically PTA Schizophrenia Suspect DTs (from EtOH and/or alprazolam) P:   RASS goal: -1 Resume Xanax 12/24 Begin seroquel 12/24 Cont Abilify Change fentanyl to PRN Transition from propofol to dexmedetomidine Cont PRN lorazepam Cont folic acid and thiamine IV Lithium/vyvanse stopped by psych   Summary - Need better control of delerium prior to extubating  The patient is critically ill with multiple organ systems failure and requires high complexity decision making for assessment and support, frequent evaluation and titration of  therapies, application of advanced monitoring technologies and extensive interpretation of multiple databases. Critical Care Time devoted to patient care services described in this note independent of APP time is 35 minutes.   Kara Mead MD. Shade Flood. [place] [facility] Pager [phone] If no response call [phone]

## 2014-11-27 NOTE — Progress Notes (Signed)
Patient ID: [name], male   DOB: 02/26/1961, 53 y.o.   MRN: [id number] Vascular Surgery Progress Note  Subjective: Postop day #5-aortobifemoral bypass graft. Patient sedated and intubated on ventilator. Recovering from EtOH or Xanax withdrawal.  Objective:  Filed Vitals:   11/27/14 0711  BP:   Pulse:   Temp: 101.6 F (38.7 C)  Resp:     Sedated on ventilator Chest no rhonchi heard Abdomen relatively soft with midline incision healing nicely Inguinal wounds healing well with 3+ femoral and dorsalis pedis pulse palpable bilaterally   Labs:  Recent Labs Lab 11/25/14 0500 11/26/14 0400 11/27/14 0400  CREATININE 0.91 0.87 0.77    Recent Labs Lab 11/25/14 0500 11/26/14 0400 11/27/14 0400  NA 140 139 141  K 3.9 3.7 3.4*  CL 112 110 107  CO2 23 25 28   BUN 7 6 10   CREATININE 0.91 0.87 0.77  GLUCOSE 73 109* 139*  CALCIUM 8.0* 8.1* 8.3*    Recent Labs Lab 11/24/14 1205 11/25/14 0500 11/26/14 0400  WBC 9.3 5.4 5.6  HGB 10.2* 8.0* 7.6*  HCT 31.0* 24.3* 23.5*  PLT 211 133* 149*    Recent Labs Lab 11/20/14 1210  INR 1.23    I/O last 3 completed shifts: In: 4622.3 [I.V.:1369.8; Blood:365; NG/GT:2887.5] Out: 1925 [MPNTI:1443]  Imaging: Dg Chest Port 1 View  11/26/2014   CLINICAL DATA:  Hypoxia  EXAM: PORTABLE CHEST - 1 VIEW  COMPARISON:  November 25, 2014  FINDINGS: Endotracheal tube tip is 3.8 cm above the carina. Nasogastric tube tip and side port are below the stomach. Central catheter tip is in the superior vena cava. No pneumothorax. There is a left pleural effusion. There is bibasilar consolidation with volume loss. Heart is mildly enlarged with pulmonary vascularity within normal limits. No adenopathy. There is postoperative change in the lower cervical spine.  IMPRESSION: Tube and catheter positions as described without pneumothorax. Left pleural effusion with bibasilar consolidation. Stable cardiac prominence. Its there is increase consolidation  the right base compared to 1 day prior. Changes on the left are essentially stable compared to 1 day prior.   Electronically Signed   By: Lowella Grip M.D.   On: 11/26/2014 07:20    Assessment/Plan:  POD #7  LOS: 7 days  s/p Procedure(s): AORTA BIFEMORAL BYPASS GRAFT  Remains stable from vascular standpoint Renal function remained stable with creatinine equal 0.9. In and out equals 4600 in-1900 urine output Hematocrit 23% yesterday-received 1 unit packed red blood cells  CCM to decide about timing of attempt at weaning from sedation and extubation. Appreciate help   Tinnie Gens, MD 11/27/2014 7:16 AM

## 2014-11-28 ENCOUNTER — Inpatient Hospital Stay (HOSPITAL_COMMUNITY): Payer: Medicaid Other

## 2014-11-28 DIAGNOSIS — G9340 Encephalopathy, unspecified: Secondary | ICD-10-CM

## 2014-11-28 LAB — GLUCOSE, CAPILLARY
Glucose-Capillary: 116 mg/dL — ABNORMAL HIGH (ref 70–99)
Glucose-Capillary: 117 mg/dL — ABNORMAL HIGH (ref 70–99)
Glucose-Capillary: 119 mg/dL — ABNORMAL HIGH (ref 70–99)
Glucose-Capillary: 120 mg/dL — ABNORMAL HIGH (ref 70–99)
Glucose-Capillary: 125 mg/dL — ABNORMAL HIGH (ref 70–99)
Glucose-Capillary: 126 mg/dL — ABNORMAL HIGH (ref 70–99)
Glucose-Capillary: 131 mg/dL — ABNORMAL HIGH (ref 70–99)

## 2014-11-28 LAB — BASIC METABOLIC PANEL
Anion gap: 6 (ref 5–15)
BUN: 15 mg/dL (ref 6–23)
CO2: 28 mmol/L (ref 19–32)
Calcium: 8.3 mg/dL — ABNORMAL LOW (ref 8.4–10.5)
Chloride: 106 mEq/L (ref 96–112)
Creatinine, Ser: 0.71 mg/dL (ref 0.50–1.35)
GFR calc Af Amer: >90 mL/min (ref >90)
GFR calc non Af Amer: >90 mL/min (ref >90)
Glucose, Bld: 119 mg/dL — ABNORMAL HIGH (ref 70–99)
Potassium: 3.9 mmol/L (ref 3.5–5.1)
Sodium: 140 mmol/L (ref 135–145)

## 2014-11-28 LAB — CBC
HCT: 29.1 % — ABNORMAL LOW (ref 39.0–52.0)
Hemoglobin: 9.6 g/dL — ABNORMAL LOW (ref 13.0–17.0)
MCH: 31.8 pg (ref 26.0–34.0)
MCHC: 33 g/dL (ref 30.0–36.0)
MCV: 96.4 fL (ref 78.0–100.0)
Platelets: 184 10*3/uL (ref 150–400)
RBC: 3.02 MIL/uL — ABNORMAL LOW (ref 4.22–5.81)
RDW: 14 % (ref 11.5–15.5)
WBC: 7.2 10*3/uL (ref 4.0–10.5)

## 2014-11-28 MED ORDER — QUETIAPINE FUMARATE 50 MG PO TABS
50.0000 mg | ORAL_TABLET | Freq: Two times a day (BID) | ORAL | Status: DC
  Administered 2014-11-28 – 2014-11-29 (×3): 50 mg
  Filled 2014-11-28 (×4): qty 1

## 2014-11-28 MED ORDER — FUROSEMIDE 10 MG/ML IJ SOLN
40.0000 mg | Freq: Once | INTRAMUSCULAR | Status: AC
  Administered 2014-11-28: 40 mg via INTRAVENOUS
  Filled 2014-11-28: qty 4

## 2014-11-28 MED ORDER — FOLIC ACID 1 MG PO TABS
1.0000 mg | ORAL_TABLET | Freq: Every day | ORAL | Status: DC
  Administered 2014-11-29: 1 mg
  Filled 2014-11-28: qty 1

## 2014-11-28 MED ORDER — VITAMIN B-1 100 MG PO TABS
100.0000 mg | ORAL_TABLET | Freq: Every day | ORAL | Status: DC
  Administered 2014-11-29: 100 mg
  Filled 2014-11-28: qty 1

## 2014-11-28 NOTE — Progress Notes (Signed)
PT Cancellation Note  Patient Details Name: [name] MRN: [id number] DOB: 04/28/1961   Cancelled Treatment:    Reason Eval/Treat Not Completed: Patient's level of consciousness Spoke with RN who reports pt is unable to appropriately follow commands. Notes indicate pt is agitated when he is not sedated.  Will continue to follow and work with patient when he is capable of appropriately following commands.  Ellouise Newer 11/28/2014, 2:03 PM Elayne Snare, [place]

## 2014-11-28 NOTE — Progress Notes (Signed)
PULMONARY / CRITICAL CARE MEDICINE   Name: [name] MRN: [id number] DOB: 04/01/1961    ADMISSION DATE:  11/20/2014 CONSULTATION DATE: 12/22  REFERRING MD :  Kellie Simmering  CHIEF COMPLAINT:  Agitation  INITIAL PRESENTATION:  53yo male with hx HTN, ETOH, Hep C, schizophrenia initially admitted 12/18 for elective aorto-bifem bypass.  Was in SDU post op when on 12/22 he developed worsening ETOH withdrawal with severe agitation, AMS< tachycardia all refractory to benzos. Ultimately required intubation and tx ICU.   STUDIES:    SIGNIFICANT EVENTS: 12/22 intubated    SUBJECTIVE:   Remains Febrile int  agitated on WUA -seroquel knocks him out Good UO, pos balance  VITAL SIGNS: Temp:  [100.1 F (37.8 C)-102.6 F (39.2 C)] 102.6 F (39.2 C) (12/26 0400) Pulse Rate:  [58-85] 61 (12/26 0700) Resp:  [15-40] 19 (12/26 0700) BP: (88-149)/(53-104) 115/55 mmHg (12/26 0700) SpO2:  [95 %-100 %] 100 % (12/26 0700) FiO2 (%):  [40 %] 40 % (12/26 0327) Weight:  [77 kg (169 lb 12.1 oz)] 77 kg (169 lb 12.1 oz) (12/26 0400) HEMODYNAMICS:   VENTILATOR SETTINGS: Vent Mode:  [-] PRVC FiO2 (%):  [40 %] 40 % Set Rate:  [14 bmp] 14 bmp Vt Set:  [530 mL] 530 mL PEEP:  [5 cmH20] 5 cmH20 Pressure Support:  [5 GBT51-76 cmH20] 12 cmH20 Plateau Pressure:  [12 cmH20-14 cmH20] 14 cmH20 INTAKE / OUTPUT:  Intake/Output Summary (Last 24 hours) at 11/28/14 0759 Last data filed at 11/28/14 0730  Gross per 24 hour  Intake 2451.53 ml  Output   1920 ml  Net 531.53 ml    PHYSICAL EXAMINATION: General:  Intermittently agitated, not F/C on precedex infusions Neuro:  RASS -3 to + 2, MAEs HEENT: No JVD/LAN, ETT Cardiovascular: reg, no M Lungs: clear anteriorly Abdomen:  Soft + bs Ext: warm, no edema   LABS: I have reviewed all of today's lab results. Relevant abnormalities are discussed in the A/P section   CXR: NNF   ASSESSMENT / PLAN:  PULMONARY OETT 12/22>> A: Acute resp failure, VDRF  due to severe agitated delirium P:   Cont vent support - settings reviewed and/or adjusted Cont vent bundle Daily SBT if/when meets criteria with goal extubation once delirium sufficiently improves  CARDIOVASCULAR L IJ CVL 12/18 >>  A:  PVD Post ABF bypass 12/18 HTN Troponin NEG P:  Post op mgmt per VVS Cont current med rx  RENAL A:  hypokalemia P:   Monitor BMET intermittently Monitor I/Os Correct electrolytes as indicated  Lasix for neg balance   GASTROINTESTINAL A:   No issues P:   SUP: PPI Cont TFs  HEMATOLOGIC A:   ICU associated anemia -1 U PRBCs 12/24 P:  DVT px: LMWH Monitor CBC intermittently Transfuse per usual ICU guidelines  INFECTIOUS A:   No acute issue P:   Monitor wbc, fever curve off abx   ENDOCRINE A:   No acute issues P:   Follow glucose with TF   NEUROLOGIC A:   H/O EtOH abuse Large dose of alprazolam chronically PTA Schizophrenia Suspect DTs (from EtOH and/or alprazolam) P:   RASS goal: -1 Resume Xanax 12/24 decrease seroquel 50 q 12h - stop if remains obtunded Cont Abilify Change fentanyl to PRN ct dexmedetomidine Cont PRN lorazepam Cont folic acid and thiamine IV Lithium/vyvanse stopped by psych   Summary - Need better control of delerium prior to extubating -either too sedated or too agitated  The patient is critically ill with multiple  organ systems failure and requires high complexity decision making for assessment and support, frequent evaluation and titration of therapies, application of advanced monitoring technologies and extensive interpretation of multiple databases. Critical Care Time devoted to patient care services described in this note independent of APP time is 35 minutes.   Kara Mead MD. Shade Flood. [place] [facility] Pager [phone] If no response call [phone]

## 2014-11-28 NOTE — Progress Notes (Signed)
Patient ID: ZED WANNINGER, male   DOB: 07/12/1961, 53 y.o.   MRN: [id number] Vascular Surgery Progress Note  Subjective: 8 days post aortobifemoral bypass graft with severe EtOH and/or Xanax withdrawal. Patient remains sedated on ventilator. Receiving tube feeds at 50 cc per hour. Thinning some efforts at weaning from ventilator.  Objecti EveDanley Danker Vitals:   11/28/14 0800  BP: 107/59  Pulse: 57  Temp:   Resp: 16   mildly sedated on ventilator but patient does follow commands. Opens eyes and does move toes to command. 3+ dorsalis pedis pulse palpable bilaterally. Abdominal wound healing nicely-abdomen relatively soft with no tenderness    Labs:  Recent Labs Lab 11/26/14 0400 11/27/14 0400 11/28/14 0400  CREATININE 0.87 0.77 0.71    Recent Labs Lab 11/26/14 0400 11/27/14 0400 11/28/14 0400  NA 139 141 140  K 3.7 3.4* 3.9  CL 110 107 106  CO2 25 28 28   BUN 6 10 15   CREATININE 0.87 0.77 0.71  GLUCOSE 109* 139* 119*  CALCIUM 8.1* 8.3* 8.3*    Recent Labs Lab 11/25/14 0500 11/26/14 0400 11/28/14 0400  WBC 5.4 5.6 7.2  HGB 8.0* 7.6* 9.6*  HCT 24.3* 23.5* 29.1*  PLT 133* 149* 184   No results for input(s): INR in the last 168 hours.  I/O last 3 completed shifts: In: 3913 [I.V.:1123; NG/GT:2690; IV Piggyback:100] Out: 2510 [Urine:2510]  Imaging: Dg Chest Port 1 View  11/28/2014   CLINICAL DATA:  Acute respiratory failure  EXAM: PORTABLE CHEST - 1 VIEW  COMPARISON:  11/26/2014  FINDINGS: Low volumes. Bibasilar airspace disease stable. Tubular device is stable. No pneumothorax. Normal heart size.  IMPRESSION: Stable bibasilar airspace disease.   Electronically Signed   By: Maryclare Bean M.D.   On: 11/28/2014 08:43    Assessment/Plan:  POD #8 LOS: 8 days  s/p Procedure(s): AORTA BIFEMORAL BYPASS Larrie Kass, MD 11/28/2014 8:46 AM   making slow progress from EtOH and/or Xanax withdrawal. Currently being slowly weaned from ventilator with possible extubation  tomorrow No evidence of sepsis currently-on Rocephin Renal function stable with good urinary output and creatinine 0.7 1X line hemoglobin stable at 9.6 g with hematocrit 29.1% Tube feeds at 50 cc/h-being well tolerated     continue management per CCM

## 2014-11-29 ENCOUNTER — Inpatient Hospital Stay (HOSPITAL_COMMUNITY): Payer: Medicaid Other

## 2014-11-29 DIAGNOSIS — F10239 Alcohol dependence with withdrawal, unspecified: Secondary | ICD-10-CM

## 2014-11-29 LAB — CBC
HCT: 28.5 % — ABNORMAL LOW (ref 39.0–52.0)
Hemoglobin: 9.1 g/dL — ABNORMAL LOW (ref 13.0–17.0)
MCH: 31 pg (ref 26.0–34.0)
MCHC: 31.9 g/dL (ref 30.0–36.0)
MCV: 96.9 fL (ref 78.0–100.0)
Platelets: 197 10*3/uL (ref 150–400)
RBC: 2.94 MIL/uL — ABNORMAL LOW (ref 4.22–5.81)
RDW: 13.8 % (ref 11.5–15.5)
WBC: 6.4 10*3/uL (ref 4.0–10.5)

## 2014-11-29 LAB — BASIC METABOLIC PANEL
Anion gap: 6 (ref 5–15)
BUN: 17 mg/dL (ref 6–23)
CO2: 28 mmol/L (ref 19–32)
Calcium: 8.4 mg/dL (ref 8.4–10.5)
Chloride: 105 mEq/L (ref 96–112)
Creatinine, Ser: 0.7 mg/dL (ref 0.50–1.35)
GFR calc Af Amer: >90 mL/min (ref >90)
GFR calc non Af Amer: >90 mL/min (ref >90)
Glucose, Bld: 111 mg/dL — ABNORMAL HIGH (ref 70–99)
Potassium: 4 mmol/L (ref 3.5–5.1)
Sodium: 139 mmol/L (ref 135–145)

## 2014-11-29 LAB — MAGNESIUM: Magnesium: 1.8 mg/dL (ref 1.5–2.5)

## 2014-11-29 LAB — CULTURE, RESPIRATORY W GRAM STAIN

## 2014-11-29 LAB — GLUCOSE, CAPILLARY
Glucose-Capillary: 103 mg/dL — ABNORMAL HIGH (ref 70–99)
Glucose-Capillary: 124 mg/dL — ABNORMAL HIGH (ref 70–99)
Glucose-Capillary: 92 mg/dL (ref 70–99)
Glucose-Capillary: 96 mg/dL (ref 70–99)

## 2014-11-29 LAB — PHOSPHORUS: Phosphorus: 3.2 mg/dL (ref 2.3–4.6)

## 2014-11-29 MED ORDER — FOLIC ACID 1 MG PO TABS
1.0000 mg | ORAL_TABLET | Freq: Every day | ORAL | Status: DC
  Administered 2014-11-30 – 2014-12-02 (×3): 1 mg via ORAL
  Filled 2014-11-29 (×3): qty 1

## 2014-11-29 MED ORDER — PANTOPRAZOLE SODIUM 40 MG PO PACK
40.0000 mg | PACK | Freq: Every day | ORAL | Status: DC
  Filled 2014-11-29: qty 20

## 2014-11-29 MED ORDER — ARIPIPRAZOLE 10 MG PO TABS
20.0000 mg | ORAL_TABLET | Freq: Every day | ORAL | Status: DC
  Administered 2014-11-29 – 2014-12-01 (×3): 20 mg via ORAL
  Filled 2014-11-29 (×4): qty 2

## 2014-11-29 MED ORDER — FUROSEMIDE 10 MG/ML IJ SOLN
40.0000 mg | Freq: Once | INTRAMUSCULAR | Status: AC
  Administered 2014-11-29: 40 mg via INTRAVENOUS
  Filled 2014-11-29: qty 4

## 2014-11-29 MED ORDER — DOCUSATE SODIUM 100 MG PO CAPS
100.0000 mg | ORAL_CAPSULE | Freq: Every day | ORAL | Status: DC
  Administered 2014-11-29 – 2014-12-02 (×4): 100 mg via ORAL
  Filled 2014-11-29 (×4): qty 1

## 2014-11-29 MED ORDER — QUETIAPINE FUMARATE 50 MG PO TABS
50.0000 mg | ORAL_TABLET | Freq: Two times a day (BID) | ORAL | Status: DC
  Filled 2014-11-29 (×3): qty 1

## 2014-11-29 MED ORDER — ALUM & MAG HYDROXIDE-SIMETH 200-200-20 MG/5ML PO SUSP
15.0000 mL | ORAL | Status: DC | PRN
  Administered 2014-11-30: 15 mL via ORAL
  Administered 2014-11-30 – 2014-12-01 (×2): 30 mL via ORAL
  Filled 2014-11-29 (×3): qty 30

## 2014-11-29 MED ORDER — ALPRAZOLAM 0.5 MG PO TABS
1.0000 mg | ORAL_TABLET | Freq: Three times a day (TID) | ORAL | Status: DC
  Administered 2014-11-29 – 2014-11-30 (×5): 1 mg via ORAL
  Filled 2014-11-29 (×5): qty 2

## 2014-11-29 MED ORDER — VALPROIC ACID 250 MG PO CAPS
500.0000 mg | ORAL_CAPSULE | Freq: Three times a day (TID) | ORAL | Status: DC
  Administered 2014-11-29 – 2014-12-02 (×9): 500 mg via ORAL
  Filled 2014-11-29 (×12): qty 2

## 2014-11-29 MED ORDER — VITAMIN B-1 100 MG PO TABS
100.0000 mg | ORAL_TABLET | Freq: Every day | ORAL | Status: DC
  Administered 2014-11-30 – 2014-12-02 (×3): 100 mg via ORAL
  Filled 2014-11-29 (×3): qty 1

## 2014-11-29 NOTE — Progress Notes (Signed)
PULMONARY / CRITICAL CARE MEDICINE   Name: [name] MRN: [id number] DOB: [birth year]    ADMISSION DATE:  11/20/2014 CONSULTATION DATE: 12/22  REFERRING MD :  Kellie Simmering  CHIEF COMPLAINT:  Agitation  INITIAL PRESENTATION:  53yo male with hx HTN, ETOH, Hep C, schizophrenia initially admitted 12/18 for elective aorto-bifem bypass.  Was in SDU post op when on 12/22 he developed worsening ETOH withdrawal with severe agitation, AMS< tachycardia all refractory to benzos. Ultimately required intubation and tx ICU.   STUDIES:    SIGNIFICANT EVENTS: 12/22 intubated    SUBJECTIVE:   Remains Febrile, low grade [place] on WUA -precedex gtt & lower dose seroquel  Good UO, pos balance  VITAL SIGNS: Temp:  [98.3 F (36.8 C)-100.4 F (38 C)] 98.4 F (36.9 C) (12/27 0700) Pulse Rate:  [56-72] 62 (12/27 0700) Resp:  [13-31] 20 (12/27 0700) BP: (89-120)/(47-71) 114/55 mmHg (12/27 0700) SpO2:  [96 %-100 %] 100 % (12/27 0700) FiO2 (%):  [40 %] 40 % (12/27 0417) Weight:  [78.1 kg (172 lb 2.9 oz)] 78.1 kg (172 lb 2.9 oz) (12/27 0400) HEMODYNAMICS:   VENTILATOR SETTINGS: Vent Mode:  [-] PRVC FiO2 (%):  [40 %] 40 % Set Rate:  [14 bmp] 14 bmp Vt Set:  [530 mL] 530 mL PEEP:  [5 cmH20] 5 cmH20 Pressure Support:  [10 cmH20] 10 cmH20 Plateau Pressure:  [13 cmH20-15 cmH20] 15 cmH20 INTAKE / OUTPUT:  Intake/Output Summary (Last 24 hours) at 11/29/14 0757 Last data filed at 11/29/14 0746  Gross per 24 hour  Intake 2515.2 ml  Output   1945 ml  Net  570.2 ml    PHYSICAL EXAMINATION: General:  Intermittently agitated, not F/C on precedex infusions Neuro:  RASS -3 to + 2, MAEs HEENT: No JVD/LAN, ETT Cardiovascular: reg, no M Lungs: clear anteriorly Abdomen:  Soft + bs Ext: warm, no edema   LABS: I have reviewed all of today's lab results. Relevant abnormalities are discussed in the A/P section   CXR: NNF   ASSESSMENT / PLAN:  PULMONARY OETT 12/22>> A: Acute resp failure,  VDRF due to severe agitated delirium P:   Cont vent support - settings reviewed and/or adjusted Cont vent bundle Daily SBT if/when meets criteria with goal extubation since delirium  improved  CARDIOVASCULAR L IJ CVL 12/18 >>  A:  PVD Post ABF bypass 12/18 HTN Troponin NEG P:  Post op mgmt per VVS Cont current med rx  RENAL A:  hypokalemia P:   Monitor BMET intermittently Monitor I/Os Correct electrolytes as indicated  Lasix for neg balance   GASTROINTESTINAL A:   No issues P:   SUP: PPI Sips & chips & advance  HEMATOLOGIC A:   ICU associated anemia -1 U PRBCs 12/24 P:  DVT px: LMWH Monitor CBC intermittently Transfuse per usual ICU guidelines  INFECTIOUS A:   No acute issue P:   Monitor wbc, fever curve off abx   ENDOCRINE A:   No acute issues P:   Follow glucose with TF   NEUROLOGIC A:   H/O EtOH abuse Large dose of alprazolam chronically PTA Schizophrenia Suspect DTs (from EtOH and/or alprazolam) P:   RASS goal: 0 Resumed Xanax 12/24 decrease seroquel 50 q 12h - stop if remains obtunded Cont Abilify Change fentanyl to PRN ct dexmedetomidine Cont PRN lorazepam Cont folic acid and thiamine IV Lithium/vyvanse stopped by psych   Summary - Proceed with extubation now that delerium better controlled  The patient is critically ill with multiple  organ systems failure and requires high complexity decision making for assessment and support, frequent evaluation and titration of therapies, application of advanced monitoring technologies and extensive interpretation of multiple databases. Critical Care Time devoted to patient care services described in this note independent of APP time is 35 minutes.   Kara Mead MD. Shade Flood. [place] [facility] Pager [phone] If no response call [phone]

## 2014-11-29 NOTE — Plan of Care (Signed)
Problem: Phase I Progression Outcomes Goal: Patient tolerating weaning plan Outcome: Completed/Met Date Met:  11/29/14 Patient extubated to 2 liters [place], and titrated to room air.

## 2014-11-29 NOTE — Plan of Care (Signed)
Problem: Phase II Progression Outcomes Goal: Date pt extubated/weaned off vent Outcome: Completed/Met Date Met:  11/29/14 11/29/14

## 2014-11-29 NOTE — Procedures (Signed)
Extubation Procedure Note  Patient Details:   Name: [name] DOB: 1961/10/21 MRN: [id number]   Airway Documentation:                                  Evaluation  O2 sats: stable throughout Complications: No apparent complications Patient did tolerate procedure well. Bilateral Breath Sounds: Clear, Diminished Suctioning: Oral Yes  Pt extubated as ordered and placed on 4 lpm [place]. Pt had a positive cuff leak before extubation and has a strong cough post extubation.  [place] Parma D 11/29/2014, 8:18 AM

## 2014-11-29 NOTE — Progress Notes (Addendum)
   [facility]  Subjective  - Extubated this am.  Alert and follows yes no questions with head nods.   Objective 117/58 65 98.4 F (36.9 C) (Oral) 25 100%  Intake/Output Summary (Last 24 hours) at 11/29/14 4696 Last data filed at 11/29/14 0746  Gross per 24 hour  Intake 2340.07 ml  Output   1900 ml  Net 440.07 ml    Abdomin soft positive hypo bowel sounds Incisions healing well Palpable DP 2+ bilaterally    Assessment/Planning: POD#9 POD AORTA BIFEMORAL BYPASS Philip Kass, MD Extubated Will watch for 24 hours may have sips of water Good renal function Cr stable 0.70 HGB stable 9.1   [name], [name] 11/29/2014 9:09 AM --  Laboratory Lab Results:  Recent Labs  11/28/14 0400 11/29/14 0400  WBC 7.2 6.4  HGB 9.6* 9.1*  HCT 29.1* 28.5*  PLT 184 197   BMET  Recent Labs  11/28/14 0400 11/29/14 0400  NA 140 139  K 3.9 4.0  CL 106 105  CO2 28 28  GLUCOSE 119* 111*  BUN 15 17  CREATININE 0.71 0.70  CALCIUM 8.3* 8.4    COAG Lab Results  Component Value Date   INR 1.23 11/20/2014   INR 1.04 11/17/2014   INR 1.1 11/10/2014   No results found for: PTT Continues to make progress Extubated yesterday Disoriented but does know his birthdate Not oriented to time or place Abdomen soft All surgical incisions healing well with 3+ dorsalis pedis pulses palpable.  We'll slowly advance diet per CCM to avoid aspiration Increase out of bed

## 2014-11-30 LAB — TYPE AND SCREEN
ABO/RH(D): A POS
Antibody Screen: NEGATIVE
Unit division: 0
Unit division: 0
Unit division: 0
Unit division: 0

## 2014-11-30 LAB — BASIC METABOLIC PANEL
Anion gap: 8 (ref 5–15)
BUN: 16 mg/dL (ref 6–23)
CO2: 27 mmol/L (ref 19–32)
Calcium: 8.6 mg/dL (ref 8.4–10.5)
Chloride: 103 mEq/L (ref 96–112)
Creatinine, Ser: 0.79 mg/dL (ref 0.50–1.35)
GFR calc Af Amer: >90 mL/min (ref >90)
GFR calc non Af Amer: >90 mL/min (ref >90)
Glucose, Bld: 103 mg/dL — ABNORMAL HIGH (ref 70–99)
Potassium: 4.2 mmol/L (ref 3.5–5.1)
Sodium: 138 mmol/L (ref 135–145)

## 2014-11-30 LAB — GLUCOSE, CAPILLARY
Glucose-Capillary: 114 mg/dL — ABNORMAL HIGH (ref 70–99)
Glucose-Capillary: 121 mg/dL — ABNORMAL HIGH (ref 70–99)

## 2014-11-30 LAB — CBC
HCT: 30.4 % — ABNORMAL LOW (ref 39.0–52.0)
Hemoglobin: 9.6 g/dL — ABNORMAL LOW (ref 13.0–17.0)
MCH: 30.6 pg (ref 26.0–34.0)
MCHC: 31.6 g/dL (ref 30.0–36.0)
MCV: 96.8 fL (ref 78.0–100.0)
Platelets: 223 10*3/uL (ref 150–400)
RBC: 3.14 MIL/uL — ABNORMAL LOW (ref 4.22–5.81)
RDW: 13.6 % (ref 11.5–15.5)
WBC: 8.1 10*3/uL (ref 4.0–10.5)

## 2014-11-30 MED ORDER — QUETIAPINE FUMARATE 50 MG PO TABS
50.0000 mg | ORAL_TABLET | Freq: Every day | ORAL | Status: DC
  Administered 2014-11-30: 50 mg via ORAL
  Filled 2014-11-30 (×2): qty 1

## 2014-11-30 MED ORDER — PANTOPRAZOLE SODIUM 40 MG PO TBEC
40.0000 mg | DELAYED_RELEASE_TABLET | Freq: Every day | ORAL | Status: DC
  Administered 2014-11-30 – 2014-12-02 (×3): 40 mg via ORAL
  Filled 2014-11-30 (×3): qty 1

## 2014-11-30 NOTE — Clinical Social Work Note (Signed)
Patient was recently extubated, did not have time to see patient to complete assessment will ask other CSW who is covering tomorrow.  [name]. [place], MSW, [place] 11/30/2014 5:59 PM

## 2014-11-30 NOTE — Progress Notes (Signed)
PULMONARY / CRITICAL CARE MEDICINE   Name: [name] MRN: [id number] DOB: [birth year]    ADMISSION DATE:  11/20/2014 CONSULTATION DATE: 12/22  REFERRING MD :  Kellie Simmering  CHIEF COMPLAINT:  Agitation  INITIAL PRESENTATION:  53yo male with hx HTN, ETOH, Hep C, schizophrenia initially admitted 12/18 for elective aorto-bifem bypass.  Was in SDU post op when on 12/22 he developed worsening ETOH withdrawal with severe agitation, AMS< tachycardia all refractory to benzos. Ultimately required intubation and tx ICU.   STUDIES:    SIGNIFICANT EVENTS: 12/22 intubated    SUBJECTIVE:    low grade fever  [place] , extubated Good UO  VITAL SIGNS: Temp:  [98.3 F (36.8 C)-100.3 F (37.9 C)] 98.7 F (37.1 C) (12/28 0720) Pulse Rate:  [61-85] 69 (12/28 0800) Resp:  [8-37] 21 (12/28 0800) BP: (95-145)/(46-77) 134/65 mmHg (12/28 0800) SpO2:  [91 %-100 %] 98 % (12/28 0800) HEMODYNAMICS:   VENTILATOR SETTINGS:   INTAKE / OUTPUT:  Intake/Output Summary (Last 24 hours) at 11/30/14 0853 Last data filed at 11/30/14 0800  Gross per 24 hour  Intake    530 ml  Output   1955 ml  Net  -1425 ml    PHYSICAL EXAMINATION: General:  Acutely ill Neuro:  RASS 0, non focal HEENT: No JVD/LAN Cardiovascular: reg, no M Lungs: clear anteriorly Abdomen:  Soft + bs Ext: warm, no edema , good pulses  LABS: I have reviewed all of today's lab results. Relevant abnormalities are discussed in the A/P section   CXR: NNF   ASSESSMENT / PLAN:  PULMONARY OETT 12/22>>12/27 A: Acute resp failure, VDRF due to severe agitated delirium P:   Cont vent support - settings reviewed and/or adjusted Cont vent bundle Daily SBT if/when meets criteria with goal extubation since delirium  improved  CARDIOVASCULAR L IJ CVL 12/18 >>  A:  PVD Post ABF bypass 12/18 HTN Troponin NEG P:  Post op mgmt per VVS Cont current med rx  RENAL A:  hypokalemia P:   Monitor BMET intermittently Monitor  I/Os Correct electrolytes as indicated  Lasix for neg balance   GASTROINTESTINAL A:   No issues P:   SUP: PPI Advance PO  HEMATOLOGIC A:   ICU associated anemia -1 U PRBCs 12/24 P:  DVT px: LMWH Monitor CBC intermittently Transfuse per usual ICU guidelines  INFECTIOUS A:   No acute issue P:   Monitor wbc, fever curve off abx   ENDOCRINE A:   No acute issues P:   Follow glucose   NEUROLOGIC A:   H/O EtOH abuse Large dose of alprazolam chronically PTA Schizophrenia Suspect DTs (from EtOH and/or alprazolam) P:   RASS goal: 0 Ct Xanax , Abilify decrease seroquel 50 daily - stop if remains obtunded Change fentanyl to PRN Cont PRN lorazepam Cont folic acid and thiamine IV Lithium/vyvanse stopped by psych   Summary - OK to transfer to tele  now that delerium better controlled. Needs psych FU & PT eval   Kara Mead MD. FCCP. [facility] Pager [phone] If no response call [phone]

## 2014-11-30 NOTE — Progress Notes (Addendum)
[facility] and oriented to person.  He states he drinks about 2 6 packs of beer daily.   Objective 134/69 69 98.7 F (37.1 C) (Oral) 26 97%  Intake/Output Summary (Last 24 hours) at 11/30/14 0805 Last data filed at 11/30/14 0700  Gross per 24 hour  Intake    510 ml  Output   1805 ml  Net  -1295 ml   Abdomin soft positive  Incision healing well  Palpable 2+ DP bilaterally      Assessment/Planning: POD # 10 AORTA BIFEMORAL BYPASS Larrie Kass, MD  Mobility encouraged Clear liquids if tolerates advance diet.  Tolerated sips yesterday, trouble will pills  Will need to re-consult Pych for medication adjustments now that he is extubated If stable will transfer him tomorrow to 2W  Laurence Slate [facility] 11/30/2014 8:05 AM --  Laboratory Lab Results:  Recent Labs  11/29/14 0400 11/30/14 0346  WBC 6.4 8.1  HGB 9.1* 9.6*  HCT 28.5* 30.4*  PLT 197 223   BMET  Recent Labs  11/29/14 0400 11/30/14 0346  NA 139 138  K 4.0 4.2  CL 105 103  CO2 28 27  GLUCOSE 111* 103*  BUN 17 16  CREATININE 0.70 0.79  CALCIUM 8.4 8.6    COAG Lab Results  Component Value Date   INR 1.23 11/20/2014   INR 1.04 11/17/2014   INR 1.1 11/10/2014   No results found for: PTT   Agree with above assessment Patient much more oriented today to time and place and person. Abdominal wound healing nicely as well as inguinal wounds with 3+ dorsalis pedis pulse palpable bilaterally. Both feet well perfused. Abdomen soft nontender. Patient having bowel movements.  Plan out of bed most of day with ambulation and sitting in chair Advance diet as tolerated We'll transfer to 2 W. in a.m. Agree with psych consult to adjust all psychotropic medications

## 2014-11-30 NOTE — Progress Notes (Signed)
Physical Therapy Treatment Patient Details Name: [name] MRN: [id number] DOB: 1961-10-31 Today's Date: 11/30/2014    History of Present Illness 53 y.o. male s/p AORTA BIFEMORAL BYPASS GRAFT. Hx of alcohol abuse. Intubated 12/22, extubated 12/27.    PT Comments    Patient progressing well towards physical therapy goals. Ambulates with min guard assist up to 450 with a rolling walker for support. Continues to require min assist for balance with transfers. Tolerated therapeutic exercises well. Patient will continue to benefit from skilled physical therapy services to further improve independence with functional mobility.   Follow Up Recommendations  SNF     Equipment Recommendations  Rolling walker with 5" wheels;3in1 (PT)    Recommendations for Other Services OT consult;Rehab consult     Precautions / Restrictions Precautions Precautions: None Restrictions Weight Bearing Restrictions: No    Mobility  Bed Mobility                  Transfers Overall transfer level: Needs assistance Equipment used: Rolling walker (2 wheeled) Transfers: Sit to/from Stand Sit to Stand: Min assist         General transfer comment: Min assist for balance upon standing. Leans posteriorly, relies on back of knees against chair for support. Cues for anterior weight shift with UE use through RW.  Ambulation/Gait Ambulation/Gait assistance: Min guard Ambulation Distance (Feet): 450 Feet Assistive device: Rolling walker (2 wheeled) (Pushed wheelchair) Gait Pattern/deviations: Step-through pattern;Decreased stride length;Trunk flexed;Drifts right/left Gait velocity: decreased   General Gait Details: Improving gait speed. Focused on upright posture with intermittent cues. HR in 80s, SpO2 100% on room air. VC for walker control with mild drifting towards pt left side. No loss of balance while pt holding RW for support.   Stairs            Wheelchair Mobility    Modified  Rankin (Stroke Patients Only)       Balance                                    Cognition Arousal/Alertness: Awake/alert Behavior During Therapy: Flat affect Overall Cognitive Status: Within Functional Limits for tasks assessed                      Exercises General Exercises - Lower Extremity Ankle Circles/Pumps: Both;10 reps;Seated;AAROM Quad Sets: Strengthening;Both;Seated;10 reps Gluteal Sets: Strengthening;Both;10 reps;Seated Long Arc Quad: Strengthening;Both;Seated;10 reps Hip Flexion/Marching: Strengthening;Both;10 reps;Seated    General Comments        Pertinent Vitals/Pain Pain Assessment: No/denies pain  BP - 122/78 HR - 83 SpO2 - 100% on room air    Home Living                      Prior Function            PT Goals (current goals can now be found in the care plan section) Acute Rehab PT Goals PT Goal Formulation: With patient Time For Goal Achievement: 12/05/14 Potential to Achieve Goals: Good Progress towards PT goals: Progressing toward goals    Frequency  Min 3X/week    PT Plan Current plan remains appropriate    Co-evaluation             End of Session Equipment Utilized During Treatment: Gait belt Activity Tolerance: Patient tolerated treatment well Patient left: in chair;with call bell/phone within reach  Time: 9323-5573 PT Time Calculation (min) (ACUTE ONLY): 23 min  Charges:  $Gait Training: 8-22 mins $Therapeutic Exercise: 8-22 mins                    G Codes:      Ellouise Newer 12-30-2014, 11:42 AM Elayne Snare, [place]

## 2014-11-30 NOTE — Plan of Care (Signed)
Problem: Phase I Progression Outcomes Goal: Voiding-avoid urinary catheter unless indicated Outcome: Not Progressing Catheter remains  Problem: Phase II Progression Outcomes Goal: Pain controlled Outcome: Completed/Met Date Met:  11/30/14 Pt has not required PRN pain medication

## 2014-12-01 DIAGNOSIS — F1020 Alcohol dependence, uncomplicated: Secondary | ICD-10-CM

## 2014-12-01 MED ORDER — CHLORPROMAZINE HCL 25 MG PO TABS
25.0000 mg | ORAL_TABLET | Freq: Three times a day (TID) | ORAL | Status: DC
  Administered 2014-12-01 – 2014-12-02 (×3): 25 mg via ORAL
  Filled 2014-12-01 (×6): qty 1

## 2014-12-01 MED ORDER — ALPRAZOLAM 0.5 MG PO TABS
1.0000 mg | ORAL_TABLET | Freq: Two times a day (BID) | ORAL | Status: DC
  Administered 2014-12-01 – 2014-12-02 (×3): 1 mg via ORAL
  Filled 2014-12-01 (×3): qty 2

## 2014-12-01 NOTE — Progress Notes (Signed)
Pt c/o hiccups; PA paged to make aware; will await callback.

## 2014-12-01 NOTE — Consult Note (Signed)
[place] [facility] Consult   Reason for Consult:  Alcohol dependence, medication adjustments Referring Physician:  Primary PA  [name] is an 53 y.o. male. Total Time spent with patient: 45 minutes  Assessment: AXIS I:  Bipolar disorder; alcohol dependence AXIS II:  Deferred AXIS III:   Past Medical History  Diagnosis Date  . Hypertension   . Hyperlipidemia   . Myocardial infarction 1996; 1997; 2000's    "total of 3" (10/22/2012)  . Hepatitis C     "diagnosed in the past 2 wk" (10/22/2012)  . Seizures     "when I get anxious" (10/22/2012)  . Anxiety   . Depression   . Schizophrenia   . PAD (peripheral artery disease)     2010 PTA & stent to left CIA & left SFA; 05/2012 PTA & stent to distal LCIA; 10/2012 thrombectomy and stents to [place]  . History of ETOH abuse     quit 2007  . Tobacco abuse   . Broken fingers   . Coronary artery disease     s/p PCI to RCA at [facility]; s/p PCI to RCA '08; Cath 05/2012 showed known occluded LCx, RCA stent w/ mild restenosis, LAD no significant dz, EF 35% . Cardiac cath in July of 2014 showed no significant change.   Marland Kitchen GERD (gastroesophageal reflux disease)     takes Protonix daily  . Bipolar affective     takes Lithium daily   AXIS IV:  Psychosocial issues, chronic mental and physical issues AXIS V:  41-50 serious symptoms  Plan:  Taper Xanax to discontinuation due to cross dependency with alcohol.  Recommend patient not continue on benzodiazepines after taper.  Patient reports stable mood.  At this time, it is recommended to maintain patient on minimal psychiatric medications.  Continue current Depakote and Abilify regiment.  Dr. Sabra Heck reviewed the patient and concurs with the plan.  Subjective:   [name] is a 53 y.o. male patient stable psychiatrically.  HPI:  Patient recovering from surgery and alcohol detox.  He reports his mood as stable.  Denies suicidal/homicidal ideations, hallucinations, and  drug abuse.  He sees a Teacher, music in [place], [place] for his care.  Epilepsy report per patient.  His last seizure was last month, grand mal.   HPI Elements:   Generalized, chronic, intermittent, years, stressors  Past Psychiatric History: Past Medical History  Diagnosis Date  . Hypertension   . Hyperlipidemia   . Myocardial infarction 1996; 1997; 2000's    "total of 3" (10/22/2012)  . Hepatitis C     "diagnosed in the past 2 wk" (10/22/2012)  . Seizures     "when I get anxious" (10/22/2012)  . Anxiety   . Depression   . Schizophrenia   . PAD (peripheral artery disease)     2010 PTA & stent to left CIA & left SFA; 05/2012 PTA & stent to distal LCIA; 10/2012 thrombectomy and stents to [place]  . History of ETOH abuse     quit 2007  . Tobacco abuse   . Broken fingers   . Coronary artery disease     s/p [facility]; s/p PCI to RCA '08; Cath 05/2012 showed known occluded LCx, RCA stent w/ mild restenosis, LAD no significant dz, EF 35% . Cardiac cath in July of 2014 showed no significant change.   Marland Kitchen GERD (gastroesophageal reflux disease)     takes Protonix daily  . Bipolar affective  takes Lithium daily    reports that he has been smoking Cigarettes.  He has a 40 pack-year smoking history. He has never used smokeless tobacco. He reports that he does not drink alcohol or use illicit drugs. Family History  Problem Relation Age of Onset  . Emphysema Father   . Diabetes Other   . Alcohol abuse Other   . Hypertension Other   . Hyperlipidemia Other   . Seizures Other      Living Arrangements: Non-relatives/Friends   Abuse/Neglect [facility]) Physical Abuse: Denies Verbal Abuse: Denies Sexual Abuse: Denies Allergies:  No Known Allergies  ACT Assessment Complete:  No:   Past Psychiatric History: Diagnosis:  Bipolar disorder  Hospitalizations:  "Few times"  Outpatient Care: [name], [place]  Substance Abuse Care:  "Few times"  Self-Mutilation:  None  Suicidal Attempts:   Denies  Homicidal Behaviors:  Denies   Violent Behaviors:  None   Place of Residence:  [place], [place] Marital Status:  Divorced Employed/Unemployed:  Disabled Education:  High school Family Supports:  Sister, mother, son Objective: Blood pressure 127/61, pulse 84, temperature 98.2 F (36.8 C), temperature source Oral, resp. rate 14, height 5' 7" (1.702 m), weight 172 lb 2.9 oz (78.1 kg), SpO2 95 %.Body mass index is 26.96 kg/(m^2). Results for orders placed or performed during the hospital encounter of 11/20/14 (from the past 72 hour(s))  Glucose, capillary     Status: Abnormal   Collection Time: 11/28/14  3:35 AM  Result Value Ref Range   Glucose-Capillary 120 (H) 70 - 99 mg/dL   Comment 1 Documented in Chart    Comment 2 Notify RN   Basic metabolic panel     Status: Abnormal   Collection Time: 11/28/14  4:00 AM  Result Value Ref Range   Sodium 140 135 - 145 mmol/L    Comment: Please note change in reference range.   Potassium 3.9 3.5 - 5.1 mmol/L    Comment: Please note change in reference range.   Chloride 106 96 - 112 mEq/L   CO2 28 19 - 32 mmol/L   Glucose, Bld 119 (H) 70 - 99 mg/dL   BUN 15 6 - 23 mg/dL   Creatinine, Ser 0.71 0.50 - 1.35 mg/dL   Calcium 8.3 (L) 8.4 - 10.5 mg/dL   GFR calc non Af Amer >90 >90 mL/min   GFR calc Af Amer >90 >90 mL/min    Comment: (NOTE) The eGFR has been calculated using the CKD EPI equation. This calculation has not been validated in all clinical situations. eGFR's persistently <90 mL/min signify possible Chronic Kidney Disease.    Anion gap 6 5 - 15  CBC     Status: Abnormal   Collection Time: 11/28/14  4:00 AM  Result Value Ref Range   WBC 7.2 4.0 - 10.5 K/uL   RBC 3.02 (L) 4.22 - 5.81 MIL/uL   Hemoglobin 9.6 (L) 13.0 - 17.0 g/dL    Comment: REPEATED TO VERIFY   HCT 29.1 (L) 39.0 - 52.0 %   MCV 96.4 78.0 - 100.0 fL   MCH 31.8 26.0 - 34.0 pg   MCHC 33.0 30.0 - 36.0 g/dL   RDW 14.0 11.5 - 15.5 %   Platelets 184 150 - 400 K/uL   Glucose, capillary     Status: Abnormal   Collection Time: 11/28/14  7:43 AM  Result Value Ref Range   Glucose-Capillary 125 (H) 70 - 99 mg/dL   Comment 1 Notify RN   Glucose, capillary  Status: Abnormal   Collection Time: 11/28/14 11:15 AM  Result Value Ref Range   Glucose-Capillary 117 (H) 70 - 99 mg/dL   Comment 1 Notify RN   Glucose, capillary     Status: Abnormal   Collection Time: 11/28/14  3:27 PM  Result Value Ref Range   Glucose-Capillary 116 (H) 70 - 99 mg/dL   Comment 1 Notify RN   Glucose, capillary     Status: Abnormal   Collection Time: 11/28/14  7:22 PM  Result Value Ref Range   Glucose-Capillary 126 (H) 70 - 99 mg/dL   Comment 1 Documented in Chart    Comment 2 Notify RN   Glucose, capillary     Status: Abnormal   Collection Time: 11/28/14 11:53 PM  Result Value Ref Range   Glucose-Capillary 103 (H) 70 - 99 mg/dL   Comment 1 Documented in Chart    Comment 2 Notify RN   Glucose, capillary     Status: Abnormal   Collection Time: 11/29/14  3:43 AM  Result Value Ref Range   Glucose-Capillary 114 (H) 70 - 99 mg/dL   Comment 1 Documented in Chart    Comment 2 Notify RN   Basic metabolic panel     Status: Abnormal   Collection Time: 11/29/14  4:00 AM  Result Value Ref Range   Sodium 139 135 - 145 mmol/L    Comment: Please note change in reference range.   Potassium 4.0 3.5 - 5.1 mmol/L    Comment: Please note change in reference range.   Chloride 105 96 - 112 mEq/L   CO2 28 19 - 32 mmol/L   Glucose, Bld 111 (H) 70 - 99 mg/dL   BUN 17 6 - 23 mg/dL   Creatinine, Ser 0.70 0.50 - 1.35 mg/dL   Calcium 8.4 8.4 - 10.5 mg/dL   GFR calc non Af Amer >90 >90 mL/min   GFR calc Af Amer >90 >90 mL/min    Comment: (NOTE) The eGFR has been calculated using the CKD EPI equation. This calculation has not been validated in all clinical situations. eGFR's persistently <90 mL/min signify possible Chronic Kidney Disease.    Anion gap 6 5 - 15  CBC     Status: Abnormal    Collection Time: 11/29/14  4:00 AM  Result Value Ref Range   WBC 6.4 4.0 - 10.5 K/uL   RBC 2.94 (L) 4.22 - 5.81 MIL/uL   Hemoglobin 9.1 (L) 13.0 - 17.0 g/dL   HCT 28.5 (L) 39.0 - 52.0 %   MCV 96.9 78.0 - 100.0 fL   MCH 31.0 26.0 - 34.0 pg   MCHC 31.9 30.0 - 36.0 g/dL   RDW 13.8 11.5 - 15.5 %   Platelets 197 150 - 400 K/uL  Magnesium     Status: None   Collection Time: 11/29/14  4:00 AM  Result Value Ref Range   Magnesium 1.8 1.5 - 2.5 mg/dL  Phosphorus     Status: None   Collection Time: 11/29/14  4:00 AM  Result Value Ref Range   Phosphorus 3.2 2.3 - 4.6 mg/dL  Glucose, capillary     Status: Abnormal   Collection Time: 11/29/14  7:48 AM  Result Value Ref Range   Glucose-Capillary 124 (H) 70 - 99 mg/dL   Comment 1 Notify RN   Glucose, capillary     Status: None   Collection Time: 11/29/14 11:12 AM  Result Value Ref Range   Glucose-Capillary 96 70 - 99 mg/dL  Comment 1 Notify RN   Glucose, capillary     Status: None   Collection Time: 11/29/14  3:32 PM  Result Value Ref Range   Glucose-Capillary 92 70 - 99 mg/dL   Comment 1 Notify RN   Basic metabolic panel     Status: Abnormal   Collection Time: 11/30/14  3:46 AM  Result Value Ref Range   Sodium 138 135 - 145 mmol/L    Comment: Please note change in reference range.   Potassium 4.2 3.5 - 5.1 mmol/L    Comment: Please note change in reference range.   Chloride 103 96 - 112 mEq/L   CO2 27 19 - 32 mmol/L   Glucose, Bld 103 (H) 70 - 99 mg/dL   BUN 16 6 - 23 mg/dL   Creatinine, Ser 0.79 0.50 - 1.35 mg/dL   Calcium 8.6 8.4 - 10.5 mg/dL   GFR calc non Af Amer >90 >90 mL/min   GFR calc Af Amer >90 >90 mL/min    Comment: (NOTE) The eGFR has been calculated using the CKD EPI equation. This calculation has not been validated in all clinical situations. eGFR's persistently <90 mL/min signify possible Chronic Kidney Disease.    Anion gap 8 5 - 15  CBC     Status: Abnormal   Collection Time: 11/30/14  3:46 AM  Result  Value Ref Range   WBC 8.1 4.0 - 10.5 K/uL   RBC 3.14 (L) 4.22 - 5.81 MIL/uL   Hemoglobin 9.6 (L) 13.0 - 17.0 g/dL   HCT 30.4 (L) 39.0 - 52.0 %   MCV 96.8 78.0 - 100.0 fL   MCH 30.6 26.0 - 34.0 pg   MCHC 31.6 30.0 - 36.0 g/dL   RDW 13.6 11.5 - 15.5 %   Platelets 223 150 - 400 K/uL  Glucose, capillary     Status: Abnormal   Collection Time: 11/30/14 11:22 AM  Result Value Ref Range   Glucose-Capillary 121 (H) 70 - 99 mg/dL   Comment 1 Capillary Sample    Labs are reviewed and are pertinent for medical issues being addressed.  Current Facility-Administered Medications  Medication Dose Route Frequency Provider Last Rate Last Dose  . 0.9 %  sodium chloride infusion   Intravenous Continuous Juanito Doom, MD 20 mL/hr at 11/30/14 2300    . acetaminophen (TYLENOL) tablet 325-650 mg  325-650 mg Oral Q4H PRN Hulen Shouts Rhyne, PA-C       Or  . acetaminophen (TYLENOL) suppository 325-650 mg  325-650 mg Rectal Q4H PRN Hulen Shouts Rhyne, PA-C   650 mg at 11/28/14 0410  . ALPRAZolam Duanne Moron) tablet 1 mg  1 mg Oral TID Mal Misty, MD   1 mg at 11/30/14 2259  . alum & mag hydroxide-simeth (MAALOX/MYLANTA) 200-200-20 MG/5ML suspension 15-30 mL  15-30 mL Oral Q2H PRN Mal Misty, MD   30 mL at 11/30/14 1205  . ARIPiprazole (ABILIFY) tablet 20 mg  20 mg Oral QHS Mal Misty, MD   20 mg at 11/30/14 2259  . aspirin chewable tablet 81 mg  81 mg Oral Daily Mal Misty, MD   81 mg at 11/30/14 0950  . cefTRIAXone (ROCEPHIN) 1 g in dextrose 5 % 50 mL IVPB - Premix  1 g Intravenous Q24H Wilhelmina Mcardle, MD   1 g at 11/30/14 0959  . docusate sodium (COLACE) capsule 100 mg  100 mg Oral Daily Mal Misty, MD   100 mg at 11/30/14 0950  .  fentaNYL (SUBLIMAZE) injection 25-100 mcg  25-100 mcg Intravenous Q2H PRN Wilhelmina Mcardle, MD   100 mcg at 11/26/14 1758  . folic acid (FOLVITE) tablet 1 mg  1 mg Oral Daily Mal Misty, MD   1 mg at 11/30/14 0950  . haloperidol lactate (HALDOL) injection 1-4 mg   1-4 mg Intravenous Q3H PRN Wilhelmina Mcardle, MD   4 mg at 11/27/14 1943  . hydrALAZINE (APRESOLINE) injection 5 mg  5 mg Intravenous Q20 Min PRN Samantha J Rhyne, PA-C      . labetalol (NORMODYNE,TRANDATE) injection 10 mg  10 mg Intravenous Q10 min PRN Hulen Shouts Rhyne, PA-C      . LORazepam (ATIVAN) injection 0.5-1 mg  0.5-1 mg Intravenous Q4H PRN Wilhelmina Mcardle, MD      . metoprolol (LOPRESSOR) injection 2-5 mg  2-5 mg Intravenous Q2H PRN Samantha J Rhyne, PA-C      . ondansetron (ZOFRAN) injection 4 mg  4 mg Intravenous Q6H PRN Hulen Shouts Rhyne, PA-C   4 mg at 11/30/14 1537  . pantoprazole (PROTONIX) EC tablet 40 mg  40 mg Oral Daily Mal Misty, MD   40 mg at 11/30/14 1226  . QUEtiapine (SEROQUEL) tablet 50 mg  50 mg Oral Daily Kara Mead V, MD   50 mg at 11/30/14 0951  . thiamine (VITAMIN B-1) tablet 100 mg  100 mg Oral Daily Mal Misty, MD   100 mg at 11/30/14 0951  . valproic acid (DEPAKENE) 250 MG capsule 500 mg  500 mg Oral TID Mal Misty, MD   500 mg at 11/30/14 2259    Psychiatric Specialty Exam:     Blood pressure 127/61, pulse 84, temperature 98.2 F (36.8 C), temperature source Oral, resp. rate 14, height 5' 7" (1.702 m), weight 172 lb 2.9 oz (78.1 kg), SpO2 95 %.Body mass index is 26.96 kg/(m^2).  General Appearance: Casual  Eye Contact::  Good  Speech:  Normal  Volume  Normal  Mood:  Euthymic   Affect:  Blunt  Thought Process:  Clear and coherent  Orientation:  Alert and oriented  Thought Content:  Coherent  Suicidal Thoughts:  No  Homicidal Thoughts: No  Memory:  Fair  Judgement:  Fair  Insight:  Fair  Psychomotor Activity:  Decreased  Concentration:  Good  Recall: [place] of Knowledge: Fair  Language: Good  Akathisia:  None  Handed: Right  AIMS (if indicated):     Assets:  Financial Resources/Insurance Housing Resilience Social Support  Sleep:      Musculoskeletal: Strength & Muscle Tone: decreased Gait & Station: Unsteady Patient  leans: N/A  Treatment Plan Summary: Taper Xanax to discontinuation due to cross dependency with alcohol.  Recommend patient not continue on benzodiazepines after taper.  Patient reports stable mood.  At this time, it is recommended to maintain patient on minimal psychiatric medications.  Continue current Depakote and Abilify regiment.  Dr. Sabra Heck reviewed the patient and concurs with the plan.  Waylan Boga, [place] 12/01/2014 2:15 AM  I have been consulted about this patient and agree with the assessment and plan Geralyn Flash A. [place].D.

## 2014-12-01 NOTE — Progress Notes (Addendum)
  AAA Progress Note    12/01/2014 7:25 AM 11 Days Post-Op  Subjective:  States he feels better.  States he's had a BM.  Thought he was going home today.  Tm 99.4 now afebrile HR 70's-80's NSR 559'R-416'L systolic 845% RA  Filed Vitals:   12/01/14 0700  BP: 142/71  Pulse: 75  Temp:   Resp: 16    Physical Exam: Cardiac:  regular Lungs:  CTAB Abdomen:  Soft, NT/ND +BM Incisions:  Midline with incisions intact.  There is a small blister int he incision of the curve of the umbilicus Extremities:  Bilateral feet are warm with 2+ DP/PT on the left and 2+DP on the right  CBC    Component Value Date/Time   WBC 8.1 11/30/2014 0346   WBC CANCELED 11/10/2014 1424   RBC 3.14* 11/30/2014 0346   RBC CANCELED 11/10/2014 1424   HGB 9.6* 11/30/2014 0346   HCT 30.4* 11/30/2014 0346   PLT 223 11/30/2014 0346   MCV 96.8 11/30/2014 0346   MCH 30.6 11/30/2014 0346   MCH 34.6* 06/09/2013 1524   MCHC 31.6 11/30/2014 0346   MCHC 35.6 06/09/2013 1524   RDW 13.6 11/30/2014 0346   RDW 13.9 06/09/2013 1524   LYMPHSABS CANCELED 11/10/2014 1424   LYMPHSABS 1.8 06/01/2010 1522   MONOABS 0.6 06/01/2010 1522   EOSABS CANCELED 11/10/2014 1424   EOSABS 0.0 06/01/2010 1522   BASOSABS CANCELED 11/10/2014 1424   BASOSABS 0.0 06/01/2010 1522    BMET    Component Value Date/Time   NA 138 11/30/2014 0346   NA 137 11/10/2014 1424   K 4.2 11/30/2014 0346   CL 103 11/30/2014 0346   CO2 27 11/30/2014 0346   GLUCOSE 103* 11/30/2014 0346   GLUCOSE 97 11/10/2014 1424   BUN 16 11/30/2014 0346   BUN 12 11/10/2014 1424   CREATININE 0.79 11/30/2014 0346   CALCIUM 8.6 11/30/2014 0346   GFRNONAA >90 11/30/2014 0346   GFRAA >90 11/30/2014 0346    INR    Component Value Date/Time   INR 1.23 11/20/2014 1210     Intake/Output Summary (Last 24 hours) at 12/01/14 0725 Last data filed at 12/01/14 0600  Gross per 24 hour  Intake    550 ml  Output   1225 ml  Net   -675 ml      Assessment/Plan:  53 y.o. male is s/p  aorto-bifemoral bypass grafting 11 Days Post-Op  -pt doing well this am-has palbable pedal pulses -Psych returned to see pt and recommends tapering Xanax due to cross dependency with alcohol.  Also recommend to not continue benzodiazepines after taper.  At this time, it is recommended to maintain pt on minimal psychiatric medication and continue Depakote and Abilfy regimen.  -will change Xanax to tid to bid -will discontinue Ativan -transfer to [place], PA-C [facility] [phone] 12/01/2014 7:25 AM   Frustrated by 3 days of hiccoughs Some serous drainage from midline incision no obvious hernia or dehiscence will observe for now Agree with transfer to 2w Consider thorazine for hiccoughs if continues Ambulate  Encourage PO intake  Ruta Hinds, MD [facility] [place]: [phone] Pager: [phone]

## 2014-12-01 NOTE — Progress Notes (Signed)
SLP Cancellation Note  Patient Details Name: JAVONE YBANEZ MRN: [id number] DOB: August 24, 1961   Cancelled treatment:       Reason Eval/Treat Not Completed: SLP screened, no needs identified, will sign off. RN reports pt tolerating PO well. Voice not significantly hoarse. Will cancel eval unless further concerns arise.    Yasenia Reedy, Katherene Ponto 12/01/2014, 12:52 PM

## 2014-12-01 NOTE — Progress Notes (Signed)
PULMONARY / CRITICAL CARE MEDICINE   Name: [name] MRN: [id number] DOB: 1961/03/31    ADMISSION DATE:  11/20/2014 CONSULTATION DATE: 12/22  REFERRING MD :  Kellie Simmering  CHIEF COMPLAINT:  Agitation  INITIAL PRESENTATION:  53yo male with hx HTN, ETOH, Hep C, schizophrenia initially admitted 12/18 for elective aorto-bifem bypass.  Was in SDU post op when on 12/22 he developed worsening ETOH withdrawal with severe agitation, AMS< tachycardia all refractory to benzos. Ultimately required intubation and tx ICU.   STUDIES:    SIGNIFICANT EVENTS: 12/22 intubated    SUBJECTIVE:   afebrile  Calmer , had a good night Denies pain   VITAL SIGNS: Temp:  [97.6 F (36.4 C)-99.4 F (37.4 C)] 97.9 F (36.6 C) (12/29 0700) Pulse Rate:  [72-92] 75 (12/29 0700) Resp:  [12-39] 16 (12/29 0700) BP: (105-142)/(49-75) 142/71 mmHg (12/29 0700) SpO2:  [91 %-100 %] 100 % (12/29 0700) Weight:  [71.3 kg (157 lb 3 oz)] 71.3 kg (157 lb 3 oz) (12/29 0400) HEMODYNAMICS:   VENTILATOR SETTINGS:   INTAKE / OUTPUT:  Intake/Output Summary (Last 24 hours) at 12/01/14 0813 Last data filed at 12/01/14 0600  Gross per 24 hour  Intake    530 ml  Output   1075 ml  Net   -545 ml    PHYSICAL EXAMINATION: General:  Acutely ill Neuro:  RASS 0, non focal HEENT: No JVD/LAN Cardiovascular: reg, no M Lungs: clear anteriorly Abdomen:  Soft + bs Ext: warm, no edema , good pulses  LABS: I have reviewed all of today's lab results. Relevant abnormalities are discussed in the A/P section   CXR: NNF   ASSESSMENT / PLAN:  PULMONARY OETT 12/22>>12/27 A: Acute resp failure, VDRF due to severe agitated delirium P:   resolved  CARDIOVASCULAR L IJ CVL 12/18 >>  A:  PVD Post ABF bypass 12/18 HTN  P:  Post op mgmt per [facility] Cont home med rx  RENAL A:  hypokalemia P:   Monitor BMET intermittently Monitor I/Os Correct electrolytes as indicated     HEMATOLOGIC A:   ICU associated anemia  -1 U PRBCs 12/24 P:  DVT px: LMWH Monitor CBC intermittently Transfuse per usual ICU guidelines  INFECTIOUS A:   No acute issue P:  Dc ceftx Monitor wbc, fever curve off abx    NEUROLOGIC A:   H/O EtOH abuse Large dose of alprazolam chronically PTA Schizophrenia Suspect DTs (from EtOH and/or alprazolam) P:   Ct Xanax , Abilify dc seroquel  Dc fentanyl  Cont PRN lorazepam Cont folic acid and thiamine  Lithium/vyvanse stopped by psych   Summary - OK to transfer to floor now that delerium better controlled. Needs psych FU & SNF rehab PCCM to sign off    Kara Mead MD. [facility]. [place] [facility] Pager [phone] If no response call [phone]

## 2014-12-01 NOTE — Progress Notes (Signed)
New orders for thorazine; will administer when available from pharmacy; will cont. To monitor.

## 2014-12-02 ENCOUNTER — Telehealth: Payer: Self-pay | Admitting: Vascular Surgery

## 2014-12-02 MED ORDER — OXYCODONE-ACETAMINOPHEN 5-325 MG PO TABS: 1.0000 | ORAL_TABLET | ORAL | Status: DC | PRN

## 2014-12-02 MED ORDER — SIMVASTATIN 10 MG PO TABS: 10.0000 mg | ORAL_TABLET | Freq: Every day | ORAL | Status: DC

## 2014-12-02 MED ORDER — ALPRAZOLAM 2 MG PO TABS: 1.0000 mg | ORAL_TABLET | Freq: Two times a day (BID) | ORAL | Status: DC | PRN

## 2014-12-02 MED ORDER — FOLIC ACID 1 MG PO TABS: 1.0000 mg | ORAL_TABLET | Freq: Every day | ORAL | Status: DC

## 2014-12-02 NOTE — Telephone Encounter (Signed)
Left msg for patient re appt, dpm

## 2014-12-02 NOTE — Progress Notes (Signed)
Every other abdominal staple d/c at this time; will cont. To monitor.

## 2014-12-02 NOTE — Telephone Encounter (Signed)
-----   Message from Mena Goes, RN sent at 12/02/2014 10:45 AM EST ----- Regarding: Schedule   ----- Message -----    From: Gabriel Earing, PA-C    Sent: 12/02/2014   9:33 AM      To: [facility] Charge Pool  S/p aortobifem bypass.  Needs to f/u with Dr. Kellie Simmering next week.  (will most likely need staple removal at that time also).  Thanks, Aldona Bar

## 2014-12-02 NOTE — Clinical Social Work Note (Signed)
CSW received referral for SNF.  Case discussed with case manager, patient does not want to go to SNF for rehab, plan is to discharge home.  CSW to sign off please re-consult if social work needs arise.  [name]. [place], MSW, [place]

## 2014-12-02 NOTE — Discharge Summary (Signed)
AAA Discharge Summary    [name] [birth year] 53 y.o. male  [id number]  Admission Date: 11/20/2014  Discharge Date: 12/02/14  Physician: No att. providers found  Admission Diagnosis: Aortoiliac Occlusive Disease with Claudication I74.0  I74.5  I70.212   HPI:   This is a 53 y.o. male who presents for evaluation of severe pain in left leg with ambulation. Patient was evaluated by Dr. Jacqulyn Cane for a 3 day history of acute onset of pain in the left leg. He is limited to walking about 25-30 feet before he develops aching discomfort. He has a history of multiple stents in both iliac arteries the most recent was about 2 years ago. Angiogram was performed by Dr. Fletcher Anon who found that the entire left iliac system was thrombosed in the right iliac system was widely patent. Patient has been noncompliant with his Plavix therapy. He continues to smoke one half pack of cigarettes per day and has smoked for 40+ years up to 2 packs per day. He denies rest pain in the foot or numbness in the foot but is unable to ambulate greater than 50 feet. He is evaluated for possible aortobifemoral bypass grafting.  Hospital Course:  The patient was admitted to the hospital and taken to the operating room on 11/20/2014 and underwent: Aortobifemoral bypass grafting using 14 x 8 mm Hemashield Dacron Graft.  By the afternoon of DOS, he was doing well with palpable pulses on the right foot and doppler signals on the left.    The pt tolerated the procedure well and was transported to the PACU in good condition. By POD 1, he had minimal NGT output and NGT was d/c'd.  He had required 2L IV bolus for BP the day of surgery.  He was kept in ICU.  CIWA protocol was started as pt told nurses he has a hx of withdrawal.  He was transferred to the stepdown on POD 2.  He also required re-insertion of his foley for urinary retention.  On POD 4, the pt did have some confusion.  He is on multiple psych meds.  Psych was  consulted to evaluate medications.  There was some confusion as to whether or not the pt consumes etoh currently.  His sister states that he does consume ~ 12 beers per day.  Later on the day of POD 4, the pt became increasingly agitated with tremors and DT's, increased RR, but continued to have good O2 sats.  Pt was intubated and transferred to [place].  Psych meds held until psych evaluation.  It was recommended by psych to discontinue Vyvanse, wellbutrin, Lithium, and Xanax.  Depakote and Abilify were continued.  Pt was continued on the Ativan CIWA protocol.  On POD 6, CCM restarted pt's Xanax and started Seroquel.   Psych re-consulted and recommended taper of Xanax due to cross dependency with etoh and to continue Abilify and Depakote.  Discussed with pt about medications he is to be discharged on.  He states that he has a psychiatrist that he has seen for 20 years.  He is recommended to f/u with him at discharge to re-evaluate medications.   His Xanax was weaned to 1mg  bid prn and this is what he is discharged on.  Pt was started on TF for nutrition as he will be intubated for a couple of days.   Pt was extubated on POD 9.   Pt did have acute surgical blood loss anemia and had a hgb drop from 8 to 7.6  and was transfused one unit of PRBC's.     Pt was transferred to [place] on POD 10.  On POD 11, he did have intractable hiccups and was started on thorazine and his hiccups resolved.  On POD 12, every other staple was removed.  He possibly has a small seroma.  He was discharged home on POD 12.  The remainder of the hospital course consisted of increasing mobilization and increasing intake of solids without difficulty.  CBC    Component Value Date/Time   WBC 8.1 11/30/2014 0346   WBC CANCELED 11/10/2014 1424   RBC 3.14* 11/30/2014 0346   RBC CANCELED 11/10/2014 1424   HGB 9.6* 11/30/2014 0346   HCT 30.4* 11/30/2014 0346   PLT 223 11/30/2014 0346   MCV 96.8 11/30/2014 0346   MCH 30.6  11/30/2014 0346   MCH 34.6* 06/09/2013 1524   MCHC 31.6 11/30/2014 0346   MCHC 35.6 06/09/2013 1524   RDW 13.6 11/30/2014 0346   RDW 13.9 06/09/2013 1524   LYMPHSABS CANCELED 11/10/2014 1424   LYMPHSABS 1.8 06/01/2010 1522   MONOABS 0.6 06/01/2010 1522   EOSABS CANCELED 11/10/2014 1424   EOSABS 0.0 06/01/2010 1522   BASOSABS CANCELED 11/10/2014 1424   BASOSABS 0.0 06/01/2010 1522    BMET    Component Value Date/Time   NA 138 11/30/2014 0346   NA 137 11/10/2014 1424   K 4.2 11/30/2014 0346   CL 103 11/30/2014 0346   CO2 27 11/30/2014 0346   GLUCOSE 103* 11/30/2014 0346   GLUCOSE 97 11/10/2014 1424   BUN 16 11/30/2014 0346   BUN 12 11/10/2014 1424   CREATININE 0.79 11/30/2014 0346   CALCIUM 8.6 11/30/2014 0346   GFRNONAA >90 11/30/2014 0346   GFRAA >90 11/30/2014 0346     Discharge Instructions:   The patient is discharged with extensive instructions on wound care and progressive ambulation.  They are instructed not to drive or perform any heavy lifting until returning to see the physician in his office.      Discharge Instructions    ABDOMINAL PROCEDURE/ANEURYSM REPAIR/AORTO-BIFEMORAL BYPASS:  Call MD for increased abdominal pain; cramping diarrhea; nausea/vomiting    Complete by:  As directed      Call MD for:  redness, tenderness, or signs of infection (pain, swelling, bleeding, redness, odor or green/yellow discharge around incision site)    Complete by:  As directed      Call MD for:  severe or increased pain, loss or decreased feeling  in affected limb(s)    Complete by:  As directed      Call MD for:  temperature >100.5    Complete by:  As directed      Driving Restrictions    Complete by:  As directed   No driving for 2 weeks     Lifting restrictions    Complete by:  As directed   No lifting for 4 weeks     Resume previous diet    Complete by:  As directed   Stop drinking alcohol.           Discharge Diagnosis:  Aortoiliac Occlusive Disease with  Claudication I74.0  I74.5  I70.212  Secondary Diagnosis: Patient Active Problem List   Diagnosis Date Noted  . Acute respiratory failure 11/24/2014  . ETOH abuse 11/24/2014  . Acute respiratory failure with hypoxia 11/24/2014  . Altered mental status 11/24/2014  . Alcohol withdrawal delirium 11/24/2014  . Bipolar disorder, currently in remission 11/24/2014  . Alcohol  dependence with withdrawal with complication 09/62/8366  . Aortoiliac occlusive disease 11/20/2014  . Chronic hepatitis C 09/12/2013  . Cardiomyopathy, ischemic 06/09/2013  . PAD (peripheral artery disease) 05/23/2012  . HTN (hypertension) 10/09/2011  . CAROTID BRUIT 12/19/2010  . DYSPNEA 06/15/2010  . HYPERLIPIDEMIA-MIXED 05/06/2010  . TOBACCO USER 05/06/2010  . CAD, NATIVE VESSEL 05/06/2010  . 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 Hypertension   . Hyperlipidemia   . Myocardial infarction 1996; 1997; 2000's    "total of 3" (10/22/2012)  . Hepatitis C     "diagnosed in the past 2 wk" (10/22/2012)  . Seizures     "when I get anxious" (10/22/2012)  . Anxiety   . Depression   . Schizophrenia   . PAD (peripheral artery disease)     2010 PTA & stent to left CIA & left SFA; 05/2012 PTA & stent to distal LCIA; 10/2012 thrombectomy and stents to [place]  . History of ETOH abuse     quit 2007  . Tobacco abuse   . Broken fingers   . Coronary artery disease     s/p PCI to RCA at [facility]; s/p PCI to RCA '08; Cath 05/2012 showed known occluded LCx, RCA stent w/ mild restenosis, LAD no significant dz, EF 35% . Cardiac cath in July of 2014 showed no significant change.   Marland Kitchen GERD (gastroesophageal reflux disease)     takes Protonix daily  . Bipolar affective     takes Lithium daily       Medication List    STOP taking these medications        buPROPion 300 MG 24 hr tablet  Commonly known as:  WELLBUTRIN XL      lisdexamfetamine 70 MG capsule  Commonly known as:  VYVANSE     lithium carbonate 450 MG CR tablet  Commonly known as:  ESKALITH      TAKE these medications        ABILIFY 20 MG tablet  Generic drug:  ARIPiprazole  Take 20 mg by mouth at bedtime.     alprazolam 2 MG tablet  Commonly known as:  XANAX  Take 0.5 tablets (1 mg total) by mouth 2 (two) times daily as needed for sleep or anxiety.     amLODipine 5 MG tablet  Commonly known as:  NORVASC  TAKE 1 TABLET BY MOUTH EVERY DAY     aspirin 81 MG EC tablet  Take 81 mg by mouth daily.     divalproex 500 MG DR tablet  Commonly known as:  DEPAKOTE  Take 500 mg by mouth 3 (three) times daily.     folic acid 1 MG tablet  Commonly known as:  FOLVITE  Take 1 tablet (1 mg total) by mouth daily.     lisinopril 2.5 MG tablet  Commonly known as:  PRINIVIL,ZESTRIL  Take 2.5 mg by mouth daily.     oxyCODONE-acetaminophen 5-325 MG per tablet  Commonly known as:  ROXICET  Take 1 tablet by mouth every 4 (four) hours as needed for severe pain.     pantoprazole 40 MG tablet  Commonly known as:  PROTONIX  Take 1 tablet (40 mg total) by mouth daily.     simvastatin 10 MG tablet  Commonly known as:  ZOCOR  Take 1 tablet (10 mg total) by mouth daily.        Roxicet #30 No Refill  Zocor #12 6RF Folic Acid (OTC)  Disposition: home  Patient's condition: is Good  Follow up: 1. Dr. Kellie Simmering in 1 week with staple removal   [name], PA-C [facility] [phone] 12/08/2014  10:07 AM   - For VQI Registry use ---   Post-op:  Time to Extubation: [x]  In OR, [ ]  < 12 hrs, [ ]  12-24 hrs, [ ]  >=24 hrs Vasopressors Req. Post-op: No ICU Stay: 2 days (1st) 6 days (2nd) Transfusion: Yes  If yes, 1 units given MI: No, [ ]  Troponin only, [ ]  EKG or Clinical New Arrhythmia: No  Complications: CHF: No Resp failure: Yes, [ ]  Pneumonia, [ x] Ventilator Chg in renal function: No, [ ]  Inc. Cr > 0.5, [ ]  Temp.  Dialysis, [ ]  Permanent dialysis Leg ischemia: No, no Surgery needed, [ ]  Yes, Surgery needed, [ ]  Amputation Bowel ischemia: No, [ ]  Medical Rx, [ ]  Surgical Rx Wound complication: No, [ ]  Superficial separation/infection, [ ]  Return to OR Return to OR: No  Return to OR for bleeding: No Stroke: No, [ ]  Minor, [ ]  Major  Discharge medications: Statin use:  Yes If No: [ ]  For Medical reasons, [ ]  Non-compliant ASA use:  Yes  If No: [ ]  For Medical reasons, [ ]  Non-compliant Plavix use:  No If No: [ ]  For Medical reasons, [ ]  Non-compliant Beta blocker use:  No If No: [ ]  For Medical reasons, [ ]  Non-compliant

## 2014-12-02 NOTE — Progress Notes (Addendum)
  Progress Note    12/02/2014 8:12 AM 12 Days Post-Op  Subjective:  No complaints  Tm 99.3 HR 70's-80's NSR 308'M-578'I systolic 69% RA  Filed Vitals:   12/02/14 0514  BP: 123/71  Pulse: 77  Temp: 97.9 F (36.6 C)  Resp: 18    Physical Exam: Cardiac:  regular Lungs:  CTAB Incisions:  Midline incision is c/d/i without drainage and staples in tact.  Bilateral groins are healing nicely. Extremities:  Palpable DP pulses bilaterally Abdomen:  Soft, NT/ND +BM  CBC    Component Value Date/Time   WBC 8.1 11/30/2014 0346   WBC CANCELED 11/10/2014 1424   RBC 3.14* 11/30/2014 0346   RBC CANCELED 11/10/2014 1424   HGB 9.6* 11/30/2014 0346   HCT 30.4* 11/30/2014 0346   PLT 223 11/30/2014 0346   MCV 96.8 11/30/2014 0346   MCH 30.6 11/30/2014 0346   MCH 34.6* 06/09/2013 1524   MCHC 31.6 11/30/2014 0346   MCHC 35.6 06/09/2013 1524   RDW 13.6 11/30/2014 0346   RDW 13.9 06/09/2013 1524   LYMPHSABS CANCELED 11/10/2014 1424   LYMPHSABS 1.8 06/01/2010 1522   MONOABS 0.6 06/01/2010 1522   EOSABS CANCELED 11/10/2014 1424   EOSABS 0.0 06/01/2010 1522   BASOSABS CANCELED 11/10/2014 1424   BASOSABS 0.0 06/01/2010 1522    BMET    Component Value Date/Time   NA 138 11/30/2014 0346   NA 137 11/10/2014 1424   K 4.2 11/30/2014 0346   CL 103 11/30/2014 0346   CO2 27 11/30/2014 0346   GLUCOSE 103* 11/30/2014 0346   GLUCOSE 97 11/10/2014 1424   BUN 16 11/30/2014 0346   BUN 12 11/10/2014 1424   CREATININE 0.79 11/30/2014 0346   CALCIUM 8.6 11/30/2014 0346   GFRNONAA >90 11/30/2014 0346   GFRAA >90 11/30/2014 0346    INR    Component Value Date/Time   INR 1.23 11/20/2014 1210     Intake/Output Summary (Last 24 hours) at 12/02/14 6295 Last data filed at 12/02/14 0514  Gross per 24 hour  Intake    380 ml  Output   1475 ml  Net  -1095 ml     Assessment:  53 y.o. male is s/p:  aorto-bifemoral bypass grafting  12 Days Post-Op   Plan: -pt doing well from surgical  standpoint and is tolerating diet -discussed with pt psych's recommendations to continue Abilfy and Depakote and discontinue other medications.   He states that he has seen a psychiatrist for 20 years.  I have asked him to f/u with him in the next week or two after discharge to evaluate medications.  He states he will do this.  Cannot put this on discharge follow up as he will not reveal his psychiatrist name.  Will put on discharge instructions. -pt started on thorazine yesterday for hiccups-these have resolved -home soon    [name], [place] [facility] [phone] 12/02/2014 8:12 AM   Groin incisions healing, palpable pulses Incision healing in abdomen, possibly small seroma.  Will d/c half of staples Hiccoughs resolved Tolerating diet D/c home today Follow up with Psych arranged Will d/c remainder of staples in 1-2 weeks  Ruta Hinds, MD [facility] [place]: [phone] Pager: [phone]

## 2014-12-02 NOTE — Progress Notes (Signed)
Physical Therapy Treatment Patient Details Name: [name] MRN: [id number] DOB: 12-14-1960 Today's Date: 12/02/2014    History of Present Illness 53 y.o. male s/p AORTA BIFEMORAL BYPASS GRAFT. Hx of alcohol abuse. Intubated 12/22, extubated 12/27.    PT Comments    Patient progressing towards physical therapy goals. Ambulates up to 375 feet with min guard assist, requires RW for safe stability. Completed stair training but requires min assist due to loss of balance. Have recommended SNF however noted that pt has refused and will therefore need HHPT to progress safety with mobility and independence. States he will have 24/7 care at home from son and roommate. Patient will continue to benefit from skilled physical therapy services to further improve independence with functional mobility.   Follow Up Recommendations  Home health PT (Pt refusing SNF therefore will require HHPT)     Equipment Recommendations  Rolling walker with 5" wheels;Other (comment) (Tub bench -RN notified.)    Recommendations for Other Services OT consult;Rehab consult     Precautions / Restrictions Precautions Precautions: None Restrictions Weight Bearing Restrictions: No    Mobility  Bed Mobility Overal bed mobility: Modified Independent                Transfers Overall transfer level: Needs assistance Equipment used: Rolling walker (2 wheeled);None Transfers: Sit to/from Stand Sit to Stand: Supervision         General transfer comment: Supervision for safety with and without a rolling walker. Practiced from lowest bed setting and low chair without need for physical assist. Cues for technique.  Ambulation/Gait Ambulation/Gait assistance: Min guard Ambulation Distance (Feet): 375 Feet Assistive device: Rolling walker (2 wheeled);None Gait Pattern/deviations: Step-through pattern;Narrow base of support;Staggering right;Decreased stride length Gait velocity: decreased Gait velocity  interpretation: Below normal speed for age/gender General Gait Details: VC for forward gaze and to widen base of support. Performed dynamic gait challenges. pt with mild loss of balance to right while performing ambulatory head turns but able to self correct; high marching, and backwards stepping. Balance improves greatly with UE support on a rolling walker. Min guard for safety.   Stairs Stairs: Yes Stairs assistance: Min assist Stair Management: No rails;Step to pattern;Forwards Number of Stairs: 13 General stair comments: Practiced stair training similar to home environment without rails. Pt did require min assist on 2 accounts due to loss of balance towards his left side while descending stairs - needing to hold the rail. Pt does not have rails at home and will therefore require physical assist to safely enter home. Knees do no buckle and pt understands to have caregiver provide support when entering/exiting home.  Wheelchair Mobility    Modified Rankin (Stroke Patients Only)       Balance                                    Cognition Arousal/Alertness: Awake/alert Behavior During Therapy: Flat affect Overall Cognitive Status: Within Functional Limits for tasks assessed                      Exercises General Exercises - Lower Extremity Ankle Circles/Pumps: Both;10 reps;Seated;AAROM Long Arc Quad: Strengthening;Both;Seated;10 reps Hip Flexion/Marching: Strengthening;Both;10 reps;Seated;Standing Other Exercises Other Exercises: Rhomberg stance with eyes open and closed x 30 sec each Other Exercises: Tandem stance on Rt and Lt 30 seconds each with eyes open and closed. Unable to tolerate Lt tandem stance without  physical assist, could not perform with eyes closed.    General Comments        Pertinent Vitals/Pain Pain Assessment: No/denies pain Pain Intervention(s): Monitored during session  HR 107 SpO2 on room air > 96%    Home Living                       Prior Function            PT Goals (current goals can now be found in the care plan section) Acute Rehab PT Goals PT Goal Formulation: With patient Time For Goal Achievement: 12/05/14 Potential to Achieve Goals: Good Progress towards PT goals: Progressing toward goals    Frequency  Min 3X/week    PT Plan Equipment recommendations need to be updated    Co-evaluation             End of Session   Activity Tolerance: Patient tolerated treatment well Patient left: in chair;with call bell/phone within reach     Time: 1010-1033 PT Time Calculation (min) (ACUTE ONLY): 23 min  Charges:  $Gait Training: 8-22 mins $Therapeutic Exercise: 8-22 mins                    G Codes:      Ellouise Newer 26-Dec-2014, 11:05 AM Elayne Snare, [place]

## 2014-12-08 ENCOUNTER — Ambulatory Visit (INDEPENDENT_AMBULATORY_CARE_PROVIDER_SITE_OTHER): Payer: Self-pay | Admitting: Vascular Surgery

## 2014-12-08 ENCOUNTER — Encounter: Payer: Self-pay | Admitting: Vascular Surgery

## 2014-12-08 VITALS — BP 120/68 | HR 95 | Temp 97.4°F | Ht 67.0 in | Wt 149.0 lb

## 2014-12-08 DIAGNOSIS — I739 Peripheral vascular disease, unspecified: Secondary | ICD-10-CM

## 2014-12-08 NOTE — Progress Notes (Signed)
Subjective:     Patient ID: [name], male   DOB: 02-23-1961, 54 y.o.   MRN: [id number]  HPI this 54 year old male is 3 weeks status post aortobifemoral bypass grafting for severe aortoiliac occlusive disease. He had a very complicated postoperative course requiring 5 days of ventilation after initial extubation. It was thought he had alcohol and/or Xanax withdrawal. He states today that he has had no alcohol in 2 years. We have gotten several different answers regarding this. He has had some drainage from his midline incision for the past few days he states. Denies any chills or fever. States his legs feel much better.   Review of Systems     Objective:   Physical Exam BP 120/68 mmHg  Pulse 95  Ht 5\' 7"  (1.702 m)  Wt 149 lb (67.586 kg)  BMI 23.33 kg/m2  SpO2 96%  Gen. chronically ill-appearing male no apparent distress Chest no rhonchi Abdominal wound has opening in midportion of incision. Skin staples were completely removed. There was slightly cloudy fluid coming from midportion of wound. Wound was opened over a distance of about 6-7 cm. Cultures were obtained. No fascial dehiscence noted although Prolene sutures were visible. Inguinal wounds well healed with 3+ pulses 3+ dorsalis pedis pulse palpable bilaterally.     Assessment:     Status post aortobifemoral bypass grafting with postoperative complication requiring ventilation likely due to Xanax withdrawal Now with partial abdominal wound dehiscence in midportion-cultures pending    Plan:     #1 home health to moist to dry site of saline gauze packing for abdominal wound daily Keflex 500 mg 1 3 times a day 2 weeks Return to see me in one week.

## 2014-12-11 ENCOUNTER — Other Ambulatory Visit: Payer: Self-pay | Admitting: Vascular Surgery

## 2014-12-11 LAB — WOUND CULTURE: Gram Stain: NONE SEEN

## 2014-12-14 ENCOUNTER — Encounter: Payer: Self-pay | Admitting: Vascular Surgery

## 2014-12-15 ENCOUNTER — Encounter: Payer: Self-pay | Admitting: Vascular Surgery

## 2014-12-15 ENCOUNTER — Ambulatory Visit (INDEPENDENT_AMBULATORY_CARE_PROVIDER_SITE_OTHER): Payer: Self-pay | Admitting: Vascular Surgery

## 2014-12-15 VITALS — BP 128/87 | HR 120 | Resp 18 | Ht 67.0 in | Wt 145.0 lb

## 2014-12-15 DIAGNOSIS — I739 Peripheral vascular disease, unspecified: Secondary | ICD-10-CM

## 2014-12-15 NOTE — Progress Notes (Signed)
Subjective:     Patient ID: [name], male   DOB: 10/21/1961, 54 y.o.   MRN: [id number]  HPI this 54 year old male returns for continued follow-up regarding his aorto bifemoral bypass graft done by me on 11/20/2014. He was seen last week with separation in the midportion of his abdominal wound down to fascia. Cultures were obtained and no organisms were seen. He has been changing the dressing daily and home health has visited 3 times per week. He has completed his antibiotics. He has had no chills and fever. He is taking one Xanax 2 mg tablet per day and drinking no alcohol he states. Review of Systems     Objective:   Physical Exam BP 128/87 mmHg  Pulse 120  Resp 18  Ht 5\' 7"  (1.702 m)  Wt 145 lb (65.772 kg)  BMI 22.71 kg/m2  Gen. chronically ill-appearing male no apparent stress alert and oriented 3 Abdominal wound examined. The base is beginning to granulate nicely with no evidence of infection or drainage. The open area in the mid portion of the abdominal wound measures a proximally 5 x 3 cm. There is no tunnel either superiorly or inferiorly. This was repacked with moist saline gauze. 3+ femoral and dorsalis pedis pulse palpable bilaterally.     Assessment:     Abdominal wound healing with dressing changes daily status post aortobifemoral bypass graft    Plan:     #1 continue daily dressing changes with moist saline gauze and keep covered with dry gauze #2 continue to increase ambulation #3 return to see me in 4 weeks

## 2015-01-11 ENCOUNTER — Encounter: Payer: Self-pay | Admitting: Vascular Surgery

## 2015-01-12 ENCOUNTER — Encounter: Payer: Self-pay | Admitting: Vascular Surgery

## 2015-01-12 ENCOUNTER — Ambulatory Visit (INDEPENDENT_AMBULATORY_CARE_PROVIDER_SITE_OTHER): Payer: Self-pay | Admitting: Vascular Surgery

## 2015-01-12 VITALS — BP 124/82 | HR 88 | Resp 16 | Ht 67.0 in | Wt 145.0 lb

## 2015-01-12 DIAGNOSIS — I739 Peripheral vascular disease, unspecified: Secondary | ICD-10-CM

## 2015-01-12 NOTE — Progress Notes (Signed)
Subjective:     Patient ID: [name], male   DOB: 01-Jun-1961, 54 y.o.   MRN: [id number]  HPI This 54 year old male returns 2 months post aortobifemoral bypass grafting. His abdominal wound has closed completely he states. He has completed his antibiotic reatment. He is taking Xanax 2 mg per ay.   Review of Systems     Objective:   Physical Exam BP 124/82 mmHg  Pulse 88  Resp 16  Ht 5\' 7"  (1.702 m)  Wt 145 lb (65.772 kg)  BMI 22.71 kg/m2   Gen. Well-developed well-nourished male no apparent stress alert and oriented 3 Abdominal wound completely healed other than eschar about 3 x 0.5 cm in midportion with no drainage or fluctuance 3+ femoral pulses with well-healed inguinal wounds     Assessment:      abdominal and inguinal wounds well healed at this point other than mid abdominal eschar     Plan:      return in 2 months with ABIs in continued follow-up

## 2015-02-15 ENCOUNTER — Telehealth: Payer: Self-pay | Admitting: Cardiovascular Disease

## 2015-02-15 NOTE — Telephone Encounter (Signed)
Spoke with [name] and the patient is not sure what he takes and did not bring a list or medications with him.  He does say that one of his medications for BP was stopped but can't remember the name.  The discharge summary still has him on Amlodipine 5mg  daily and Lisinopril 2.5mg  daily.  He is going to go home and our office will contact him to get medications he is taking.  Then we can run this by the DOD as to what the plan will be with medications for his BP going forward

## 2015-02-15 NOTE — Telephone Encounter (Signed)
Due to the fact that patient hs not taken his medications since Dec I feel he needs to come in and have this addressed  Will add him on to Dr Tyrell Antonio schedule for tomorrow  Lenna Sciara is calling to schedule patient

## 2015-02-15 NOTE — Telephone Encounter (Signed)
New message      Pt c/o BP issue: STAT if pt c/o blurred vision, one-sided weakness or slurred speech  1. What are your last 5 BP readings? 167/102 pt in their office now 2. Are you having any other symptoms (ex. Dizziness, headache, blurred vision, passed out)? depression  3. What is your BP issue? Pt seeing Dr Rosine Door now and they are concerned about his bp.  They want to talk to someone while pt is still there.  Pt had a cabg in dec 2015.

## 2015-02-15 NOTE — Telephone Encounter (Signed)
Patient not taking a lot of his medications since December. Patient has stopped taking amlodipine, folic acid, lisinopril. Protonix, and simvastatin. Patient does not have any of these medications and would need prescription. Will forward to [employer] RN.

## 2015-02-16 ENCOUNTER — Telehealth: Payer: Self-pay | Admitting: *Deleted

## 2015-02-16 ENCOUNTER — Ambulatory Visit: Payer: Self-pay | Admitting: Cardiovascular Disease

## 2015-02-16 NOTE — Telephone Encounter (Signed)
Patient called and stated he had a doctors appt yesterday and his blood pressure was 160/106 He denies any symptoms and "feels fine"  He wanted to make sure that was all right  He does not have a blood pressure device at home and does not check it regularly  Patient stated he was going to the pharmacy today to have it rechecked  I informed him that blood pressure is often elevated while in the doctors office  Advised him to call if blood pressure remains elevated when he rechecks it

## 2015-02-16 NOTE — Telephone Encounter (Signed)
Patient is worried about his bp and its 160/106 and it was taken yesterday around 1 pm.

## 2015-03-04 ENCOUNTER — Ambulatory Visit (INDEPENDENT_AMBULATORY_CARE_PROVIDER_SITE_OTHER): Payer: Medicaid Other | Admitting: Cardiovascular Disease

## 2015-03-04 ENCOUNTER — Encounter: Payer: Self-pay | Admitting: Cardiovascular Disease

## 2015-03-04 VITALS — BP 158/104 | HR 60 | Ht 67.0 in | Wt 162.5 lb

## 2015-03-04 DIAGNOSIS — I739 Peripheral vascular disease, unspecified: Secondary | ICD-10-CM

## 2015-03-04 DIAGNOSIS — I10 Essential (primary) hypertension: Secondary | ICD-10-CM

## 2015-03-04 DIAGNOSIS — I2510 Atherosclerotic heart disease of native coronary artery without angina pectoris: Secondary | ICD-10-CM | POA: Diagnosis not present

## 2015-03-04 DIAGNOSIS — E785 Hyperlipidemia, unspecified: Secondary | ICD-10-CM

## 2015-03-04 MED ORDER — LISINOPRIL 20 MG PO TABS: 20.0000 mg | ORAL_TABLET | Freq: Every day | ORAL | Status: DC

## 2015-03-04 NOTE — Progress Notes (Signed)
HPI  [name] is a 54 year old gentleman who is here today for a followup visit regarding PAD and coronary artery disease. He has a history of coronary artery disease, stent to his RCA in April 2008 with occluded circumflex, bilateral iliac stenting ,  mild to moderate renal artery stenosis on the left, history of hepatitis C, long alcohol history currently in remission, long history of smoking , hypertension, bipolar disorder and hyperlipidemia .  He has chronic left buttock and thigh discomfort which could be related to a chronically occluded internal iliac artery.  Most recent cardiac catheterization in July 2014 showed patent RCA stent with chronically occluded left circumflex artery. Ejection fraction of 40%. He had occluded left iliac stents and underwent successful aortobifemoral bypass by Dr. Kellie Simmering in December. He had postoperative pneumonia and confusion. His antihypertensive medications were stopped due to hypotension. However, his blood pressure has been going out completely. He denies claudication.   No Known Allergies   Current Outpatient Prescriptions on File Prior to Visit  Medication Sig Dispense Refill  . ABILIFY 20 MG tablet Take 20 mg by mouth at bedtime.  3  . alprazolam (XANAX) 2 MG tablet Take 0.5 tablets (1 mg total) by mouth 2 (two) times daily as needed for sleep or anxiety. 30 tablet 0  . aspirin 81 MG EC tablet Take 81 mg by mouth daily.      . divalproex (DEPAKOTE) 500 MG EC tablet Take 500 mg by mouth 3 (three) times daily.     Marland Kitchen oxyCODONE-acetaminophen (ROXICET) 5-325 MG per tablet Take 1 tablet by mouth every 4 (four) hours as needed for severe pain. 30 tablet 0  . simvastatin (ZOCOR) 10 MG tablet Take 1 tablet (10 mg total) by mouth daily. 30 tablet 5   No current facility-administered medications on file prior to visit.     Past Medical History  Diagnosis Date  . Hypertension   . Hyperlipidemia   . Myocardial infarction 1996; 1997; 2000's   "total of 3" (10/22/2012)  . Hepatitis C     "diagnosed in the past 2 wk" (10/22/2012)  . Seizures     "when I get anxious" (10/22/2012)  . Anxiety   . Depression   . Schizophrenia   . PAD (peripheral artery disease)     2010 PTA & stent to left CIA & left SFA; 05/2012 PTA & stent to distal LCIA; 10/2012 thrombectomy and stents to [place]  . History of ETOH abuse     quit 2007  . Tobacco abuse   . Broken fingers   . Coronary artery disease     s/p PCI to RCA at [facility]; s/p PCI to RCA '08; Cath 05/2012 showed known occluded LCx, RCA stent w/ mild restenosis, LAD no significant dz, EF 35% . Cardiac cath in July of 2014 showed no significant change.   Marland Kitchen GERD (gastroesophageal reflux disease)     takes Protonix daily  . Bipolar affective     takes Lithium daily     Past Surgical History  Procedure Laterality Date  . Subdural hematoma evacuation via craniotomy    . Anterior cervical decomp/discectomy fusion  01/2011    plate & screw placement   . Iliac artery stent  10/22/2012    "2" (10/22/2012)  . Brain surgery    . Posterior fusion cervical spine  2012  . Liver biopsy  10/2012  . Cardiac catheterization      Stent - 12  .  Coronary angioplasty with stent placement  03/2007    to RCA  . Iliac artery stent  06/2012    left  . Left heart catheterization with coronary angiogram N/A 05/29/2012    Procedure: LEFT HEART CATHETERIZATION WITH CORONARY ANGIOGRAM;  Surgeon: Wellington Hampshire, MD;  Location: [place] CATH LAB;  Service: Cardiovascular;  Laterality: N/A;  . Lower extremity angiogram N/A 05/29/2012    Procedure: LOWER EXTREMITY ANGIOGRAM;  Surgeon: Wellington Hampshire, MD;  Location: [place] CATH LAB;  Service: Cardiovascular;  Laterality: N/A;  . Percutaneous stent intervention Left 05/29/2012    Procedure: PERCUTANEOUS STENT INTERVENTION;  Surgeon: Wellington Hampshire, MD;  Location: [place] CATH LAB;  Service: Cardiovascular;  Laterality: Left;  . Abdominal aortagram N/A 10/22/2012     Procedure: ABDOMINAL Maxcine Ham;  Surgeon: Wellington Hampshire, MD;  Location: [facility] CATH LAB;  Service: Cardiovascular;  Laterality: N/A;  . Abdominal aortagram N/A 11/11/2014    Procedure: ABDOMINAL Maxcine Ham;  Surgeon: Wellington Hampshire, MD;  Location: [place] CATH LAB;  Service: Cardiovascular;  Laterality: N/A;  . Aorta - bilateral femoral artery bypass graft N/A 11/20/2014    Procedure: AORTA BIFEMORAL BYPASS GRAFT;  Surgeon: Mal Misty, MD;  Location: [facility] OR;  Service: Vascular;  Laterality: N/A;     Family History  Problem Relation Age of Onset  . Emphysema Father   . Diabetes Other   . Alcohol abuse Other   . Hypertension Other   . Hyperlipidemia Other   . Seizures Other      History   Social History  . Marital Status: Divorced    Spouse Name: N/A  . Number of Children: N/A  . Years of Education: N/A   Occupational History  . Disabled    Social History Main Topics  . Smoking status: Current Every Day Smoker -- 1.00 packs/day for 40 years    Types: Cigarettes  . Smokeless tobacco: Never Used  . Alcohol Use: No     Comment: 10/22/2012 "last drink was 5-7 yr ago; used to have a problem w/it"  . Drug Use: No  . Sexual Activity: Yes   Other Topics Concern  . Not on file   Social History Narrative   17 years Bipolar/ADHD.   Pt gets regular exercise.     PHYSICAL EXAM   BP 158/104 mmHg  Pulse 60  Ht 5\' 7"  (1.702 m)  Wt 162 lb 8 oz (73.71 kg)  BMI 25.45 kg/m2  Constitutional: He is oriented to person, place, and time. He appears well-developed and well-nourished. No distress.  HENT: No nasal discharge.  Head: Normocephalic and atraumatic.  Eyes: Pupils are equal and round. Right eye exhibits no discharge. Left eye exhibits no discharge.  Neck: Normal range of motion. Neck supple. No JVD present. No thyromegaly present.  Cardiovascular: Normal rate, regular rhythm, normal heart sounds and. Exam reveals no gallop and no friction rub. No murmur heard.    Pulmonary/Chest: Effort normal and breath sounds normal. No stridor. No respiratory distress. He has no wheezes. He has no rales. He exhibits no tenderness.  Abdominal: Soft. Bowel sounds are normal. He exhibits no distension. There is no tenderness. There is no rebound and no guarding.  Musculoskeletal: Normal range of motion. He exhibits no edema and no tenderness.  Neurological: He is alert and oriented to person, place, and time. Coordination normal.  Skin: Skin is warm and dry. No rash noted. He is not diaphoretic. No erythema. No pallor.  Psychiatric: He has a normal  mood and affect. His behavior is normal. Judgment and thought content normal.       ASSESSMENT AND PLAN

## 2015-03-04 NOTE — Patient Instructions (Signed)
Your physician has recommended you make the following change in your medication: START LISINOPRIL ( 20 MG ) DAILY, Sent into [facility] pharmacy per pt's request.   Your physician recommends that you schedule a follow-up appointment in: 3 months with Dr. Fletcher Anon.

## 2015-03-08 ENCOUNTER — Encounter: Payer: Self-pay | Admitting: Vascular Surgery

## 2015-03-08 ENCOUNTER — Other Ambulatory Visit: Payer: Self-pay

## 2015-03-08 DIAGNOSIS — I739 Peripheral vascular disease, unspecified: Secondary | ICD-10-CM

## 2015-03-08 DIAGNOSIS — Z48812 Encounter for surgical aftercare following surgery on the circulatory system: Secondary | ICD-10-CM

## 2015-03-08 NOTE — Assessment & Plan Note (Signed)
He has no symptoms of angina. 

## 2015-03-08 NOTE — Assessment & Plan Note (Signed)
He is doing well after aortobifemoral bypass with resolution of claudication. Continue medical therapy.

## 2015-03-08 NOTE — Assessment & Plan Note (Signed)
Continue treatment with simvastatin with a target LDL of less than 70. 

## 2015-03-08 NOTE — Assessment & Plan Note (Signed)
His blood pressure has been running high. He used to be on amlodipine and small dose lisinopril. I elected to resume lisinopril at 20 mg once daily.

## 2015-03-09 ENCOUNTER — Ambulatory Visit (INDEPENDENT_AMBULATORY_CARE_PROVIDER_SITE_OTHER): Payer: Medicaid Other | Admitting: Vascular Surgery

## 2015-03-09 ENCOUNTER — Ambulatory Visit (HOSPITAL_COMMUNITY)
Admission: RE | Admit: 2015-03-09 | Discharge: 2015-03-09 | Disposition: A | Discharge status: Home / Self Care | Payer: Medicaid Other | Source: Ambulatory Visit | Attending: Vascular Surgery | Admitting: Vascular Surgery

## 2015-03-09 VITALS — BP 112/78 | HR 69 | Resp 16 | Ht 67.0 in | Wt 160.0 lb

## 2015-03-09 DIAGNOSIS — I739 Peripheral vascular disease, unspecified: Secondary | ICD-10-CM

## 2015-03-09 DIAGNOSIS — Z48812 Encounter for surgical aftercare following surgery on the circulatory system: Secondary | ICD-10-CM

## 2015-03-09 NOTE — Progress Notes (Signed)
Subjective:     Patient ID: [name], male   DOB: 03/20/1961, 54 y.o.   MRN: [id number]  HPI this 54 year old male returns for months post aortobifemoral bypass grafting. He states his legs are much improved as far as ambulation is concerned. He continues to smoke one half pack cigarettes per day. He has no other complaints. He denies chills fever or drainage from his incisions.  Past Medical History  Diagnosis Date  . Hypertension   . Hyperlipidemia   . Myocardial infarction 1996; 1997; 2000's    "total of 3" (10/22/2012)  . Hepatitis C     "diagnosed in the past 2 wk" (10/22/2012)  . Seizures     "when I get anxious" (10/22/2012)  . Anxiety   . Depression   . Schizophrenia   . PAD (peripheral artery disease)     2010 PTA & stent to left CIA & left SFA; 05/2012 PTA & stent to distal LCIA; 10/2012 thrombectomy and stents to [place]  . History of ETOH abuse     quit 2007  . Tobacco abuse   . Broken fingers   . Coronary artery disease     s/p PCI to RCA at [facility]; s/p PCI to RCA '08; Cath 05/2012 showed known occluded LCx, RCA stent w/ mild restenosis, LAD no significant dz, EF 35% . Cardiac cath in July of 2014 showed no significant change.   Marland Kitchen GERD (gastroesophageal reflux disease)     takes Protonix daily  . Bipolar affective     takes Lithium daily    History  Substance Use Topics  . Smoking status: Current Every Day Smoker -- 1.00 packs/day for 40 years    Types: Cigarettes  . Smokeless tobacco: Never Used  . Alcohol Use: No     Comment: 10/22/2012 "last drink was 5-7 yr ago; used to have a problem w/it"    Family History  Problem Relation Age of Onset  . Emphysema Father   . Diabetes Other   . Alcohol abuse Other   . Hypertension Other   . Hyperlipidemia Other   . Seizures Other     No Known Allergies   Current outpatient prescriptions:  .  ABILIFY 20 MG tablet, Take 20 mg by mouth at bedtime., Disp: , Rfl: 3 .  alprazolam (XANAX) 2 MG  tablet, Take 0.5 tablets (1 mg total) by mouth 2 (two) times daily as needed for sleep or anxiety., Disp: 30 tablet, Rfl: 0 .  aspirin 81 MG EC tablet, Take 81 mg by mouth daily.  , Disp: , Rfl:  .  clopidogrel (PLAVIX) 75 MG tablet, Take 75 mg by mouth daily. , Disp: , Rfl:  .  divalproex (DEPAKOTE) 500 MG EC tablet, Take 500 mg by mouth 3 (three) times daily. , Disp: , Rfl:  .  lisinopril (PRINIVIL,ZESTRIL) 20 MG tablet, Take 1 tablet (20 mg total) by mouth daily., Disp: 30 tablet, Rfl: 6 .  lithium carbonate (ESKALITH) 450 MG CR tablet, 2 (two) times daily., Disp: , Rfl:  .  simvastatin (ZOCOR) 10 MG tablet, Take 1 tablet (10 mg total) by mouth daily., Disp: 30 tablet, Rfl: 5 .  oxyCODONE-acetaminophen (ROXICET) 5-325 MG per tablet, Take 1 tablet by mouth every 4 (four) hours as needed for severe pain. (Patient not taking: Reported on 03/09/2015), Disp: 30 tablet, Rfl: 0  Filed Vitals:   03/09/15 1602  BP: 112/78  Pulse: 69  Resp: 16  Height: 5\' 7"  (1.702 m)  Weight: 160 lb (72.576 kg)    Body mass index is 25.05 kg/(m^2).         Review of Systems denies chest pain, dyspnea on exertion, PND, orthopnea, hemoptysis     Objective:   Physical Exam BP 112/78 mmHg  Pulse 69  Resp 16  Ht 5\' 7"  (1.702 m)  Wt 160 lb (72.576 kg)  BMI 25.05 kg/m2  Gen. well-developed well-nourished male no apparent stress alert and oriented 3 Lungs no rhonchi or wheezing Cardiovascular regular rhythm no murmurs Abdominal incision has healed nicely. There is a small ventral hernia 20 the xiphoid and umbilicus. 3+ femoral and dorsalis pedis pulses palpable bilaterally.  Today I ordered bilateral ABIs which I reviewed and interpreted. They exceed 1 on the right and equal 1 on the left with triphasic flow bilaterally     Assessment:     Widely patent aortobifemoral bypass grafting performed on 11/20/2014 with no claudication symptoms    Plan:     Return in 6 months to see nurse practitioner and  check ABIs

## 2015-03-10 NOTE — Addendum Note (Signed)
Addended by: Mena Goes on: 03/10/2015 09:32 AM   Modules accepted: Orders

## 2015-04-21 ENCOUNTER — Telehealth: Payer: Self-pay

## 2015-04-21 NOTE — Telephone Encounter (Signed)
Phone call from pt.  Reported he has a lump in the abdominal incision.  Reported it was present when he saw Dr. Kellie Simmering the last appt. On 03/09/15.  Reported it has enlarged from last appt.  Compares the size of the lump to "the size of a plum."  Stated it is soft, and nonpainful.  Denied redness or warmth, fever/ chills, or nausea/ vomiting.  Stated the abdominal incision is intact ; denies any drainage.  Reported he has had diarrhea x 2 weeks.  Denied being on any antibiotics recently,  Denied change in diet.  Noted that last progress note indicated pt. Had a small ventral hernia.  Will discuss with Dr. Kellie Simmering, and return call to pt. with further recommendations.

## 2015-04-21 NOTE — Telephone Encounter (Signed)
Discussed pt's symptoms with Dr. Kellie Simmering.  Recommended pt. to continue to monitor noted lump in abdominal incision.  Recommended to schedule office f/u in a few weeks for re-evaluation, but to go to the ER if has severe pain in site.  Pt. advised of Dr. Evelena Leyden recommendations.  Advised will call pt. tomorrow with future appt., and to call office if concerns or worsening of symptoms, prior to the appt.  Verb. Understanding.

## 2015-04-23 ENCOUNTER — Encounter: Payer: Self-pay | Admitting: Vascular Surgery

## 2015-04-27 ENCOUNTER — Ambulatory Visit (INDEPENDENT_AMBULATORY_CARE_PROVIDER_SITE_OTHER): Payer: Medicaid Other | Admitting: Vascular Surgery

## 2015-04-27 VITALS — BP 112/68 | HR 90 | Resp 16 | Ht 67.0 in | Wt 155.0 lb

## 2015-04-27 DIAGNOSIS — K432 Incisional hernia without obstruction or gangrene: Secondary | ICD-10-CM | POA: Diagnosis not present

## 2015-04-27 NOTE — Progress Notes (Signed)
Subjective:     Patient ID: [name], male   DOB: 11-Nov-1961, 54 y.o.   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 He has done well since that time but has developed a bulge in the upper part of his midline abdominal incision which has enlarged over the last 4 weeks. He has no history of obstruction, change in bowel habits, blood per rectum, or nausea and vomiting. He does have some stable claudication in the left calf.  Past Medical History  Diagnosis Date  . Hypertension   . Hyperlipidemia   . Myocardial infarction 1996; 1997; 2000's    "total of 3" (10/22/2012)  . Hepatitis C     "diagnosed in the past 2 wk" (10/22/2012)  . Seizures     "when I get anxious" (10/22/2012)  . Anxiety   . Depression   . Schizophrenia   . PAD (peripheral artery disease)     2010 PTA & stent to left CIA & left SFA; 05/2012 PTA & stent to distal LCIA; 10/2012 thrombectomy and stents to [place]  . History of ETOH abuse     quit 2007  . Tobacco abuse   . Broken fingers   . Coronary artery disease     s/p PCI to RCA at [facility]; s/p PCI to RCA '08; Cath 05/2012 showed known occluded LCx, RCA stent w/ mild restenosis, LAD no significant dz, EF 35% . Cardiac cath in July of 2014 showed no significant change.   Marland Kitchen GERD (gastroesophageal reflux disease)     takes Protonix daily  . Bipolar affective     takes Lithium daily    History  Substance Use Topics  . Smoking status: Current Every Day Smoker -- 1.00 packs/day for 40 years    Types: Cigarettes  . Smokeless tobacco: Never Used  . Alcohol Use: No     Comment: 10/22/2012 "last drink was 5-7 yr ago; used to have a problem w/it"    Family History  Problem Relation Age of Onset  . Emphysema Father   . Diabetes Other   . Alcohol abuse Other   . Hypertension Other   . Hyperlipidemia Other   . Seizures Other      No Known Allergies   Current outpatient prescriptions:  .  ABILIFY 20 MG tablet, Take 20 mg by mouth at bedtime., Disp: , Rfl: 3 .  alprazolam (XANAX) 2 MG tablet, Take 0.5 tablets (1 mg total) by mouth 2 (two) times daily as needed for sleep or anxiety., Disp: 30 tablet, Rfl: 0 .  aspirin 81 MG EC tablet, Take 81 mg by mouth daily.  , Disp: , Rfl:  .  clopidogrel (PLAVIX) 75 MG tablet, Take 75 mg by mouth daily. , Disp: , Rfl:  .  divalproex (DEPAKOTE) 500 MG EC tablet, Take 500 mg by mouth 3 (three) times daily. , Disp: , Rfl:  .  lisinopril (PRINIVIL,ZESTRIL) 20 MG tablet, Take 1 tablet (20 mg total) by mouth daily., Disp: 30 tablet, Rfl: 6 .  lithium carbonate (ESKALITH) 450 MG CR tablet, 2 (two) times daily., Disp: , Rfl:  .  oxyCODONE-acetaminophen (ROXICET) 5-325 MG per tablet, Take 1 tablet by mouth every 4 (four) hours as needed for severe pain. (Patient not taking: Reported on 03/09/2015), Disp: 30 tablet, Rfl: 0 .  simvastatin (ZOCOR) 10 MG tablet, Take 1 tablet (10 mg total) by mouth daily.,  Disp: 30 tablet, Rfl: 5  Filed Vitals:   04/27/15 1520  BP: 112/68  Pulse: 90  Resp: 16  Height: 5\' 7"  (1.702 m)  Weight: 155 lb (70.308 kg)    Body mass index is 24.27 kg/(m^2).           Review of Systems patient has history of hepatitis C, coronary artery disease with previous myocardial infarctions although he is quite stable.     Objective:   Physical Exam BP 112/68 mmHg  Pulse 90  Resp 16  Ht 5\' 7"  (1.702 m)  Wt 155 lb (70.308 kg)  BMI 24.27 kg/m2  Gen. well-developed well-nourished male in no apparent stress alert and oriented 3 Lungs no rhonchi or wheezing Midline abdominal incision has ventral incisional hernia measuring about 3-4 cm in length between the umbilicus and the xiphoid. No evidence of incarceration. No evidence of infection but there is some thinning of the skin overlying this.     Assessment:     Postoperative incisional hernia 6 months  post aortobifemoral bypass grafting with no evidence of obstruction or incarceration    Plan:     Feel that this ventral hernia should probably be repaired sign Have referred him to Dr. Serita Grammes for further evaluation and possible ventral hernia repair  patient to return in October for follow-up as previously scheduled

## 2015-04-28 ENCOUNTER — Encounter: Payer: Self-pay | Admitting: Vascular Surgery

## 2015-04-28 NOTE — Addendum Note (Signed)
Addended by: Dorthula Rue L on: 04/28/2015 02:19 PM   Modules accepted: Orders

## 2015-05-28 ENCOUNTER — Ambulatory Visit: Payer: Self-pay | Admitting: Cardiovascular Disease

## 2015-06-10 ENCOUNTER — Encounter: Payer: Self-pay | Admitting: Cardiovascular Disease

## 2015-06-10 ENCOUNTER — Ambulatory Visit (INDEPENDENT_AMBULATORY_CARE_PROVIDER_SITE_OTHER): Payer: Medicaid Other | Admitting: Cardiovascular Disease

## 2015-06-10 VITALS — BP 104/72 | HR 76 | Ht 67.0 in | Wt 159.5 lb

## 2015-06-10 DIAGNOSIS — Z720 Tobacco use: Secondary | ICD-10-CM

## 2015-06-10 DIAGNOSIS — F17200 Nicotine dependence, unspecified, uncomplicated: Secondary | ICD-10-CM

## 2015-06-10 DIAGNOSIS — I7409 Other arterial embolism and thrombosis of abdominal aorta: Secondary | ICD-10-CM

## 2015-06-10 DIAGNOSIS — I255 Ischemic cardiomyopathy: Secondary | ICD-10-CM | POA: Diagnosis not present

## 2015-06-10 DIAGNOSIS — I10 Essential (primary) hypertension: Secondary | ICD-10-CM

## 2015-06-10 DIAGNOSIS — I2510 Atherosclerotic heart disease of native coronary artery without angina pectoris: Secondary | ICD-10-CM | POA: Diagnosis not present

## 2015-06-10 DIAGNOSIS — Z01810 Encounter for preprocedural cardiovascular examination: Secondary | ICD-10-CM

## 2015-06-10 DIAGNOSIS — I700 Atherosclerosis of aorta: Secondary | ICD-10-CM

## 2015-06-10 NOTE — Progress Notes (Signed)
HPI  [name] is a 54 year old gentleman who is here today for a followup visit regarding PAD and coronary artery disease. He has a history of coronary artery disease, stent to his RCA in April 2008 with occluded circumflex, bilateral iliac stenting ,  mild to moderate renal artery stenosis on the left, history of hepatitis C, long alcohol history currently in remission, long history of smoking , hypertension, bipolar disorder and hyperlipidemia .  Most recent cardiac catheterization in July 2014 showed patent RCA stent with chronically occluded left circumflex artery. Ejection fraction of 40%. He underwent aortobifemoral bypass by Dr. Kellie Simmering in December 2015. He had postoperative pneumonia and confusion.  During last visit, I resumed treatment with lisinopril. He has abdominal hernia and needs surgery. He denies any chest pain or worsening shortness of breath. He cut down on smoking and currently smokes half a pack per day. He denies any current alcohol use.    No Known Allergies   Current Outpatient Prescriptions on File Prior to Visit  Medication Sig Dispense Refill  . ABILIFY 20 MG tablet Take 20 mg by mouth at bedtime.  3  . alprazolam (XANAX) 2 MG tablet Take 0.5 tablets (1 mg total) by mouth 2 (two) times daily as needed for sleep or anxiety. 30 tablet 0  . aspirin 81 MG EC tablet Take 81 mg by mouth daily.      . clopidogrel (PLAVIX) 75 MG tablet Take 75 mg by mouth daily.     . divalproex (DEPAKOTE) 500 MG EC tablet Take 500 mg by mouth 3 (three) times daily.     Marland Kitchen lisinopril (PRINIVIL,ZESTRIL) 20 MG tablet Take 1 tablet (20 mg total) by mouth daily. 30 tablet 6  . lithium carbonate (ESKALITH) 450 MG CR tablet 2 (two) times daily.    Marland Kitchen oxyCODONE-acetaminophen (ROXICET) 5-325 MG per tablet Take 1 tablet by mouth every 4 (four) hours as needed for severe pain. 30 tablet 0  . simvastatin (ZOCOR) 10 MG tablet Take 1 tablet (10 mg total) by mouth daily. 30 tablet 5   No  current facility-administered medications on file prior to visit.     Past Medical History  Diagnosis Date  . Hypertension   . Hyperlipidemia   . Myocardial infarction 1996; 1997; 2000's    "total of 3" (10/22/2012)  . Hepatitis C     "diagnosed in the past 2 wk" (10/22/2012)  . Seizures     "when I get anxious" (10/22/2012)  . Anxiety   . Depression   . Schizophrenia   . PAD (peripheral artery disease)     2010 PTA & stent to left CIA & left SFA; 05/2012 PTA & stent to distal LCIA; 10/2012 thrombectomy and stents to [facility]  . History of ETOH abuse     quit 2007  . Tobacco abuse   . Broken fingers   . Coronary artery disease     s/p PCI to RCA at [facility]; s/p PCI to RCA '08; Cath 05/2012 showed known occluded LCx, RCA stent w/ mild restenosis, LAD no significant dz, EF 35% . Cardiac cath in July of 2014 showed no significant change.   Marland Kitchen GERD (gastroesophageal reflux disease)     takes Protonix daily  . Bipolar affective     takes Lithium daily     Past Surgical History  Procedure Laterality Date  . Subdural hematoma evacuation via craniotomy    . Anterior cervical decomp/discectomy fusion  01/2011  plate & screw placement   . Iliac artery stent  10/22/2012    "2" (10/22/2012)  . Brain surgery    . Posterior fusion cervical spine  2012  . Liver biopsy  10/2012  . Cardiac catheterization      Stent - 12  . Coronary angioplasty with stent placement  03/2007    to RCA  . Iliac artery stent  06/2012    left  . Left heart catheterization with coronary angiogram N/A 05/29/2012    Procedure: LEFT HEART CATHETERIZATION WITH CORONARY ANGIOGRAM;  Surgeon: Wellington Hampshire, MD;  Location: [place] CATH LAB;  Service: Cardiovascular;  Laterality: N/A;  . Lower extremity angiogram N/A 05/29/2012    Procedure: LOWER EXTREMITY ANGIOGRAM;  Surgeon: Wellington Hampshire, MD;  Location: [place] CATH LAB;  Service: Cardiovascular;  Laterality: N/A;  . Percutaneous stent intervention Left  05/29/2012    Procedure: PERCUTANEOUS STENT INTERVENTION;  Surgeon: Wellington Hampshire, MD;  Location: [facility] CATH LAB;  Service: Cardiovascular;  Laterality: Left;  . Abdominal aortagram N/A 10/22/2012    Procedure: ABDOMINAL Maxcine Ham;  Surgeon: Wellington Hampshire, MD;  Location: [facility] CATH LAB;  Service: Cardiovascular;  Laterality: N/A;  . Abdominal aortagram N/A 11/11/2014    Procedure: ABDOMINAL Maxcine Ham;  Surgeon: Wellington Hampshire, MD;  Location: [place] CATH LAB;  Service: Cardiovascular;  Laterality: N/A;  . Aorta - bilateral femoral artery bypass graft N/A 11/20/2014    Procedure: AORTA BIFEMORAL BYPASS GRAFT;  Surgeon: Mal Misty, MD;  Location: [facility] OR;  Service: Vascular;  Laterality: N/A;     Family History  Problem Relation Age of Onset  . Emphysema Father   . Diabetes Other   . Alcohol abuse Other   . Hypertension Other   . Hyperlipidemia Other   . Seizures Other      History   Social History  . Marital Status: Divorced    Spouse Name: N/A  . Number of Children: N/A  . Years of Education: N/A   Occupational History  . Disabled    Social History Main Topics  . Smoking status: Current Every Day Smoker -- 1.00 packs/day for 40 years    Types: Cigarettes  . Smokeless tobacco: Never Used  . Alcohol Use: No     Comment: 10/22/2012 "last drink was 5-7 yr ago; used to have a problem w/it"  . Drug Use: No  . Sexual Activity: Yes   Other Topics Concern  . Not on file   Social History Narrative   17 years Bipolar/ADHD.   Pt gets regular exercise.     PHYSICAL EXAM   BP 104/72 mmHg  Pulse 76  Ht 5\' 7"  (1.702 m)  Wt 159 lb 8 oz (72.349 kg)  BMI 24.98 kg/m2  Constitutional: He is oriented to person, place, and time. He appears well-developed and well-nourished. No distress.  HENT: No nasal discharge.  Head: Normocephalic and atraumatic.  Eyes: Pupils are equal and round. Right eye exhibits no discharge. Left eye exhibits no discharge.  Neck: Normal range of motion.  Neck supple. No JVD present. No thyromegaly present.  Cardiovascular: Normal rate, regular rhythm, normal heart sounds and. Exam reveals no gallop and no friction rub. No murmur heard.  Pulmonary/Chest: Effort normal and breath sounds normal. No stridor. No respiratory distress. He has no wheezes. He has no rales. He exhibits no tenderness.  Abdominal: Soft. Bowel sounds are normal. He exhibits no distension. There is no tenderness. There is no rebound and no guarding.  Musculoskeletal: Normal range of motion. He exhibits no edema and no tenderness.  Neurological: He is alert and oriented to person, place, and time. Coordination normal.  Skin: Skin is warm and dry. No rash noted. He is not diaphoretic. No erythema. No pallor.  Psychiatric: He has a normal mood and affect. His behavior is normal. Judgment and thought content normal.     EKG: Sinus  Rhythm  -Left axis -anterior fascicular block.   -  Nonspecific T-abnormality.   ABNORMAL    ASSESSMENT AND PLAN

## 2015-06-10 NOTE — Assessment & Plan Note (Signed)
Continue medical therapy. I discontinued Plavix . Continue low-dose aspirin.

## 2015-06-10 NOTE — Assessment & Plan Note (Signed)
He is trying to quit smoking with a nicotine patch.

## 2015-06-10 NOTE — Assessment & Plan Note (Signed)
He is doing well after an aortobifemoral bypass. No recurrent claudication.

## 2015-06-10 NOTE — Patient Instructions (Signed)
Medication Instructions: Stop taking Plavix.  Continue other medications.   Labwork: None.   Procedures/Testing: None.   Follow-Up: 6 months with Dr. Perian Tedder.   Any Additional Special Instructions Will Be Listed Below (If Applicable).   

## 2015-06-10 NOTE — Assessment & Plan Note (Addendum)
The patient has been overall stable from a cardiac standpoint with no recent anginal symptoms. His functional capacity is reasonable and EKG does not show any ischemic changes. Thus, he is considered at low to moderate risk for cardiovascular complications. I do not recommend ischemic cardiac evaluation before surgery. He tolerated aortobifemoral bypass last year without cardiac complications. He did have postoperative confusion which I suspect was likely due to his multiple psychiatric medications. These have to be managed carefully in the perioperative period.  I discontinued his Plavix as this is no longer indicated.

## 2015-06-16 ENCOUNTER — Telehealth: Payer: Self-pay

## 2015-06-16 DIAGNOSIS — B182 Chronic viral hepatitis C: Secondary | ICD-10-CM

## 2015-06-16 NOTE — Telephone Encounter (Signed)
-----   Message from [name] sent at 05/24/2015 11:42 AM EDT ----- Regarding: Re: Result Follow-up Sent: 06/04/2015  Phone Encounter: 12/29/2014  Call pt and send final viral load order to [facility]. This will be pt's 1 year. GF  > From: Alaric Gladwin > To: Donnesha Karg > Sent: 01/19/2015 9:49 AM > Pt notified. GF  > From: Lucilla Lame MD > To: Babatunde Seago > Sent: 01/18/2015 5:50 PM > Let the patient know his labs are normal and his HCV is negative.

## 2015-06-16 NOTE — Telephone Encounter (Signed)
-----   Message from [name] sent at 05/24/2015 11:42 AM EDT ----- Regarding: Re: Result Follow-up Sent: 06/04/2015  Phone Encounter: 12/29/2014  Call pt and send final viral load order to [facility]. This will be pt's 1 year. GF  > From: Marcianne Ozbun > To: [name] > Sent: 01/19/2015 9:49 AM > Pt notified. GF  > From: Lucilla Lame MD > To: [name] > Sent: 01/18/2015 5:50 PM > Let the patient know his labs are normal and his HCV is negative.

## 2015-06-16 NOTE — Telephone Encounter (Signed)
Pt notified of lab order being mailed to repeat his viral load and Hepatic function test.

## 2015-06-16 NOTE — Telephone Encounter (Signed)
-----   Message from [name] sent at 05/24/2015 11:42 AM EDT ----- Regarding: Re: Result Follow-up Sent: 06/04/2015  Phone Encounter: 12/29/2014  Call pt and send final viral load order to [facility]. This will be pt's 1 year. GF  > From: Tomasina Keasling > To: Macayla Ekdahl > Sent: 01/19/2015 9:49 AM > Pt notified. GF  > From: Lucilla Lame MD > To: Won Kreuzer > Sent: 01/18/2015 5:50 PM > Let the patient know his labs are normal and his HCV is negative.

## 2015-07-21 ENCOUNTER — Other Ambulatory Visit: Payer: Self-pay

## 2015-08-19 ENCOUNTER — Other Ambulatory Visit: Payer: Self-pay | Admitting: General Surgery

## 2015-08-24 ENCOUNTER — Encounter (HOSPITAL_COMMUNITY): Payer: Self-pay

## 2015-08-24 ENCOUNTER — Encounter (HOSPITAL_COMMUNITY)
Admission: RE | Admit: 2015-08-24 | Discharge: 2015-08-24 | Disposition: A | Discharge status: Home / Self Care | Payer: Medicaid Other | Source: Ambulatory Visit | Attending: General Surgery | Admitting: General Surgery

## 2015-08-24 DIAGNOSIS — Z01812 Encounter for preprocedural laboratory examination: Secondary | ICD-10-CM | POA: Insufficient documentation

## 2015-08-24 DIAGNOSIS — K432 Incisional hernia without obstruction or gangrene: Secondary | ICD-10-CM | POA: Diagnosis not present

## 2015-08-24 LAB — BASIC METABOLIC PANEL
Anion gap: 8 (ref 5–15)
BUN: 14 mg/dL (ref 6–20)
CO2: 24 mmol/L (ref 22–32)
Calcium: 10 mg/dL (ref 8.9–10.3)
Chloride: 103 mmol/L (ref 101–111)
Creatinine, Ser: 1.42 mg/dL — ABNORMAL HIGH (ref 0.61–1.24)
GFR calc Af Amer: >60 mL/min (ref >60)
GFR calc non Af Amer: 54 mL/min — ABNORMAL LOW (ref >60)
Glucose, Bld: 99 mg/dL (ref 65–99)
Potassium: 4.8 mmol/L (ref 3.5–5.1)
Sodium: 135 mmol/L (ref 135–145)

## 2015-08-24 LAB — CBC WITH DIFFERENTIAL/PLATELET
Basophils Absolute: 0.1 10*3/uL (ref 0.0–0.1)
Basophils Relative: 1 %
Eosinophils Absolute: 0.3 10*3/uL (ref 0.0–0.7)
Eosinophils Relative: 4 %
HCT: 43 % (ref 39.0–52.0)
Hemoglobin: 14.6 g/dL (ref 13.0–17.0)
Lymphocytes Relative: 23 %
Lymphs Abs: 2 10*3/uL (ref 0.7–4.0)
MCH: 32.9 pg (ref 26.0–34.0)
MCHC: 34 g/dL (ref 30.0–36.0)
MCV: 96.8 fL (ref 78.0–100.0)
Monocytes Absolute: 0.8 10*3/uL (ref 0.1–1.0)
Monocytes Relative: 9 %
Neutro Abs: 5.7 10*3/uL (ref 1.7–7.7)
Neutrophils Relative %: 63 %
Platelets: 198 10*3/uL (ref 150–400)
RBC: 4.44 MIL/uL (ref 4.22–5.81)
RDW: 12.2 % (ref 11.5–15.5)
WBC: 8.9 10*3/uL (ref 4.0–10.5)

## 2015-08-24 LAB — URINALYSIS, ROUTINE W REFLEX MICROSCOPIC
Bilirubin Urine: NEGATIVE
Glucose, UA: NEGATIVE mg/dL
Hgb urine dipstick: NEGATIVE
Ketones, ur: NEGATIVE mg/dL
Leukocytes, UA: NEGATIVE
Nitrite: NEGATIVE
Protein, ur: NEGATIVE mg/dL
Specific Gravity, Urine: 1.02 (ref 1.005–1.030)
Urobilinogen, UA: 0.2 mg/dL (ref 0.0–1.0)
pH: 5.5 (ref 5.0–8.0)

## 2015-08-24 NOTE — Pre-Procedure Instructions (Signed)
    [name]  08/24/2015      [id number] - [name], [place] - [address] [place] [address] Phone: [phone] Fax: [phone]    Your procedure is scheduled on 08/30/15.  Report to [facility] Admitting at 730 A.M.  Call this number if you have problems the morning of surgery:  [phone]   Remember:  Do not eat food or drink liquids after midnight.  Take these medicines the morning of surgery with A SIP OF WATER --xanax,depakote,lithium,oxycodone,protonix   Do not wear jewelry, make-up or nail polish.  Do not wear lotions, powders, or perfumes.  You may wear deodorant.  Do not shave 48 hours prior to surgery.  Men may shave [name] and neck.  Do not bring valuables to the hospital.  [facility] is not responsible for any belongings or valuables.  Contacts, dentures or bridgework may not be worn into surgery.  Leave your suitcase in the car.  After surgery it may be brought to your room.  For patients admitted to the hospital, discharge time will be determined by your treatment team.  Patients discharged the day of surgery will not be allowed to drive home.   Name and phone number of your driver:    Special instructions:   Please read over the following fact sheets that you were given. Pain Booklet, Coughing and Deep Breathing and Surgical Site Infection Prevention

## 2015-08-27 IMAGING — CR DG CHEST 1V PORT
1 series · 1 of 1 positions shown · non-contrast
Comparison: November 20, 2014

CLINICAL DATA: Status post aortobifemoral bypass grafting ;
atelectasis

EXAM:
PORTABLE CHEST - 1 VIEW

[AP]
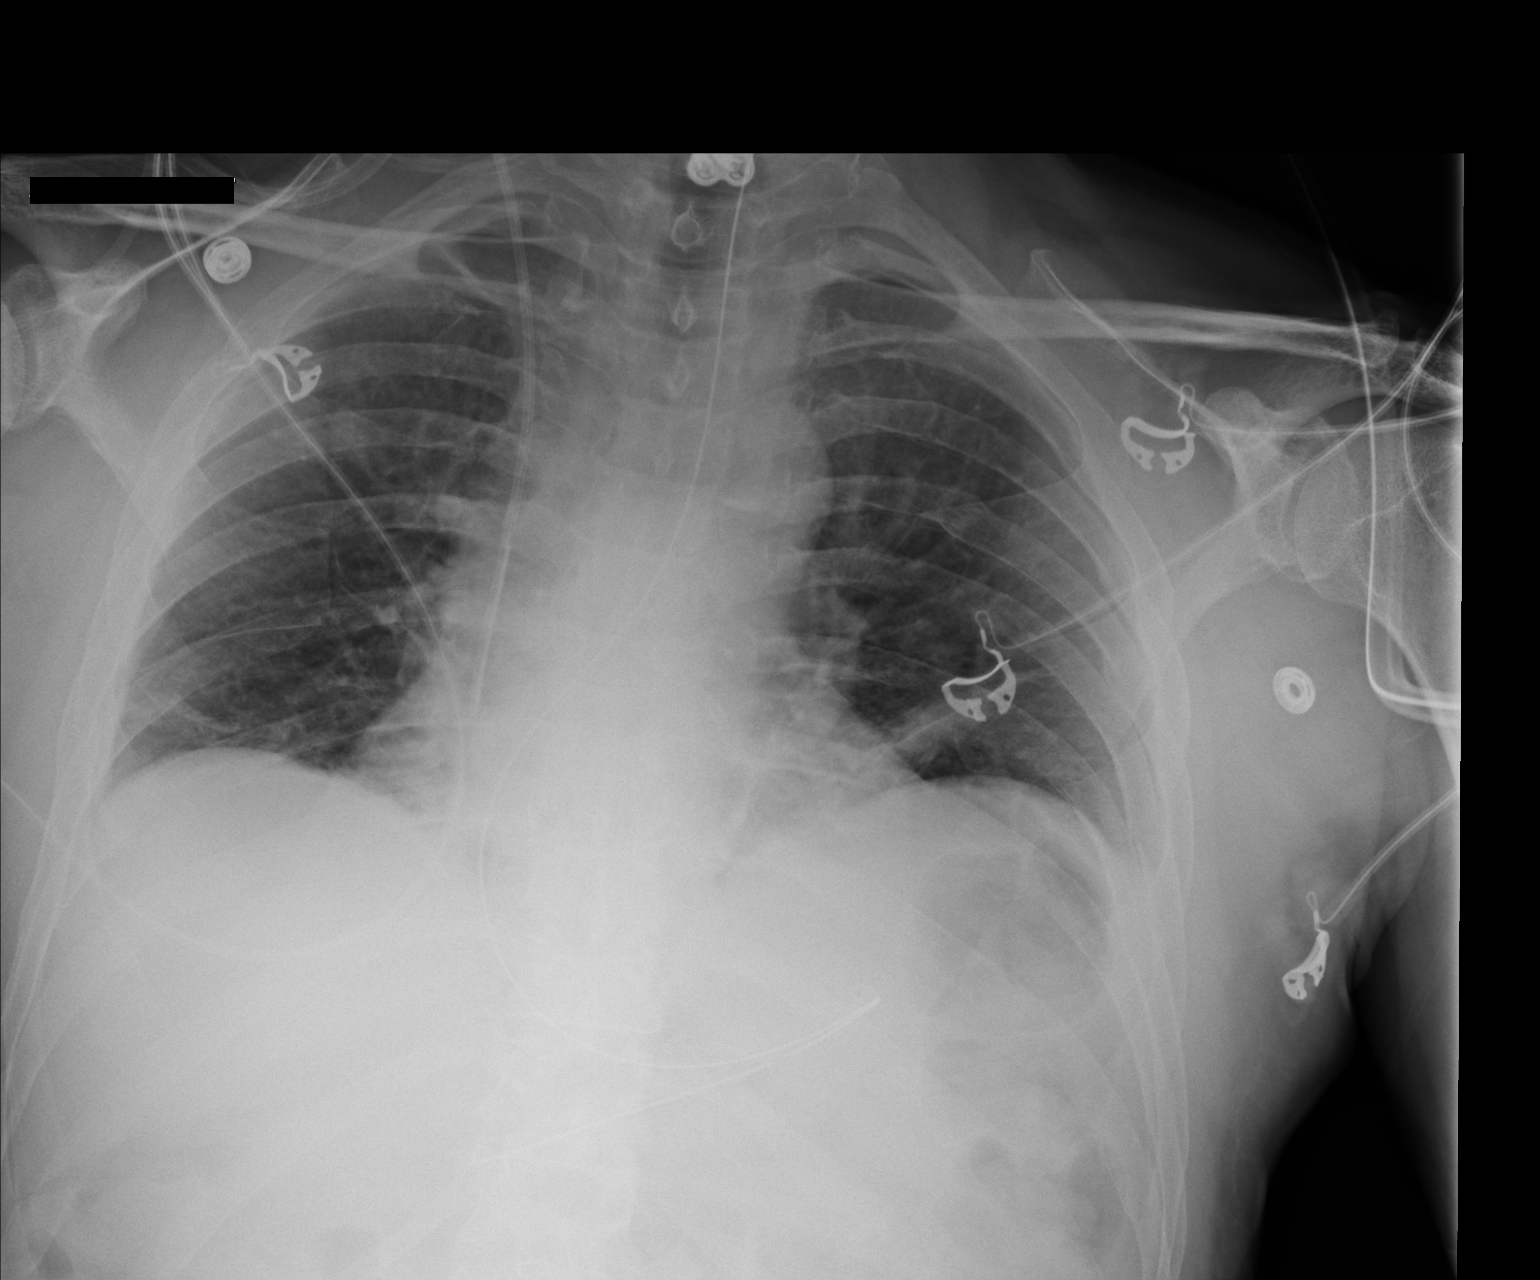

[1 of 1 positions shown; findings below may reference images not displayed]

FINDINGS: Central catheter tip is at the cavoatrial junction. Nasogastric tube
tip and side port are in the stomach. No pneumothorax. There is
bibasilar atelectatic change. Lungs are otherwise clear. Heart is
upper normal in size with pulmonary vascularity within normal limits
No adenopathy. There is atherosclerotic change in the left carotid
artery. There is postoperative change in the lower cervical spine.
IMPRESSION: Tube and catheter positions as described without pneumothorax.
Bibasilar atelectatic change. No consolidation.

## 2015-08-29 NOTE — H&P (Signed)
  42 yom who underwent afb in December 2015 with an incisional (epigastric mostly) hernia. I saw him initially in June and he now has not been smoking for over a month. He has been off patch for about 3 weeks. The hernia is slowly increasing in size and is bothering him. preventing him from doing some activities. no change in bms. he has been evaluated by cardiology already.    Other Problems Rolm Bookbinder, MD; 08/02/2015 2:00 PM) Alcohol Abuse Depression Gastroesophageal Reflux Disease Hepatitis Myocardial infarction Seizure Disorder  Past Surgical History Rolm Bookbinder, MD; 08/02/2015 2:00 PM) Bypass Surgery for Poor Blood Flow to Legs Colon Polyp Removal - Colonoscopy Coronary Artery Bypass Graft  Allergies Elbert Ewings, CMA; 08/02/2015 1:28 PM) No Known Drug Allergies06/21/2016  Medication History Elbert Ewings, CMA; 08/02/2015 1:28 PM) Vyvanse (70MG  Capsule, Oral) Active. ALPRAZolam (2MG  Tablet, Oral) Active. Clopidogrel Bisulfate (75MG  Tablet, Oral) Active. Divalproex Sodium ER (500MG  Tablet ER 24HR, Oral) Active. Lisinopril (20MG  Tablet, Oral) Active. Lithium Carbonate ER (450MG  Tablet ER, Oral) Active. Simvastatin (10MG  Tablet, Oral) Active. Abilify (20MG  Tablet, Oral) Active. Medications Reconciled  Social History Rolm Bookbinder, MD; 08/02/2015 2:00 PM) Tobacco use Current every day smoker.  Vitals Elbert Ewings CMA; 08/02/2015 1:29 PM) 08/02/2015 1:28 PM Weight: 164 lb Height: 67in Body Surface Area: 1.88 m Body Mass Index: 25.69 kg/m Temp.: 97.67F(Temporal)  Pulse: 82 (Regular)  BP: 130/70 (Sitting, Left Arm, Standard)    Physical Exam Rolm Bookbinder MD; 08/02/2015 2:01 PM) General Mental Status-Alert. Orientation-Oriented X3. Chest and Lung Exam Chest and lung exam reveals -on auscultation, normal breath sounds, no adventitious sounds and normal vocal resonance. Cardiovascular Cardiovascular examination  reveals -normal heart sounds, regular rate and rhythm with no murmurs. Abdomen Soft reducible large incisional hernia, healed incision    Assessment & Plan Rolm Bookbinder MD; 08/02/2015 2:04 PM) Fatima Blank HERNIA ([phone]  [id number]) Story: we discussed all options for repair including laparoscopic vs open. the size and location make this difficult. he has told me he is no longer smoking and is off patches also. He has been seen by cardiology and is off plavix. this is causing him symptoms and he would like repaired. I think due to his age, size of hernia and location that a laparoscopic repair with mesh would not be ideal. I talked to him today about a preperitoneal mesh repair of this hernia with possible tar release to medialize abdominal wall. I explained this fully and the rationale behind it. we also discussed the length of hospital stay and recovery. he understands this will be weeks. we also discussed risks which include but are not limited to bleeding, infection, reoperation, recurrence, injury to bowel, open wound, mesh infection, mi, dvt, pe stroke etc. will plan on proceeding in next few weeks.

## 2015-08-30 ENCOUNTER — Inpatient Hospital Stay (HOSPITAL_COMMUNITY): Payer: Medicaid Other | Admitting: Anesthesiology

## 2015-08-30 ENCOUNTER — Encounter (HOSPITAL_COMMUNITY)
Admission: RE | Disposition: A | Discharge status: Home / Self Care | Payer: Self-pay | Source: Ambulatory Visit | Attending: General Surgery

## 2015-08-30 ENCOUNTER — Ambulatory Visit (HOSPITAL_COMMUNITY)
Admission: RE | Admit: 2015-08-30 | Discharge: 2015-08-30 | Disposition: A | Discharge status: Home / Self Care | Payer: Medicaid Other | Source: Ambulatory Visit | Attending: General Surgery | Admitting: General Surgery

## 2015-08-30 ENCOUNTER — Encounter (HOSPITAL_COMMUNITY): Payer: Self-pay | Admitting: *Deleted

## 2015-08-30 DIAGNOSIS — F329 Major depressive disorder, single episode, unspecified: Secondary | ICD-10-CM | POA: Diagnosis not present

## 2015-08-30 DIAGNOSIS — I252 Old myocardial infarction: Secondary | ICD-10-CM | POA: Insufficient documentation

## 2015-08-30 DIAGNOSIS — K219 Gastro-esophageal reflux disease without esophagitis: Secondary | ICD-10-CM | POA: Insufficient documentation

## 2015-08-30 DIAGNOSIS — Z87891 Personal history of nicotine dependence: Secondary | ICD-10-CM | POA: Diagnosis not present

## 2015-08-30 DIAGNOSIS — G40909 Epilepsy, unspecified, not intractable, without status epilepticus: Secondary | ICD-10-CM | POA: Diagnosis not present

## 2015-08-30 DIAGNOSIS — Z79899 Other long term (current) drug therapy: Secondary | ICD-10-CM | POA: Diagnosis not present

## 2015-08-30 DIAGNOSIS — K432 Incisional hernia without obstruction or gangrene: Secondary | ICD-10-CM | POA: Insufficient documentation

## 2015-08-30 DIAGNOSIS — Z538 Procedure and treatment not carried out for other reasons: Secondary | ICD-10-CM | POA: Insufficient documentation

## 2015-08-30 LAB — BASIC METABOLIC PANEL
Anion gap: 5 (ref 5–15)
BUN: 16 mg/dL (ref 6–20)
CO2: 26 mmol/L (ref 22–32)
Calcium: 9.8 mg/dL (ref 8.9–10.3)
Chloride: 105 mmol/L (ref 101–111)
Creatinine, Ser: 1.34 mg/dL — ABNORMAL HIGH (ref 0.61–1.24)
GFR calc Af Amer: >60 mL/min (ref >60)
GFR calc non Af Amer: 58 mL/min — ABNORMAL LOW (ref >60)
Glucose, Bld: 103 mg/dL — ABNORMAL HIGH (ref 65–99)
Potassium: 5.5 mmol/L — ABNORMAL HIGH (ref 3.5–5.1)
Sodium: 136 mmol/L (ref 135–145)

## 2015-08-30 IMAGING — CR DG CHEST 1V PORT
1 series · 1 of 1 positions shown · non-contrast
Comparison: 11/21/2014

CLINICAL DATA: 53-year-old follow-up rhonchi

EXAM:
PORTABLE CHEST - 1 VIEW

[portable]
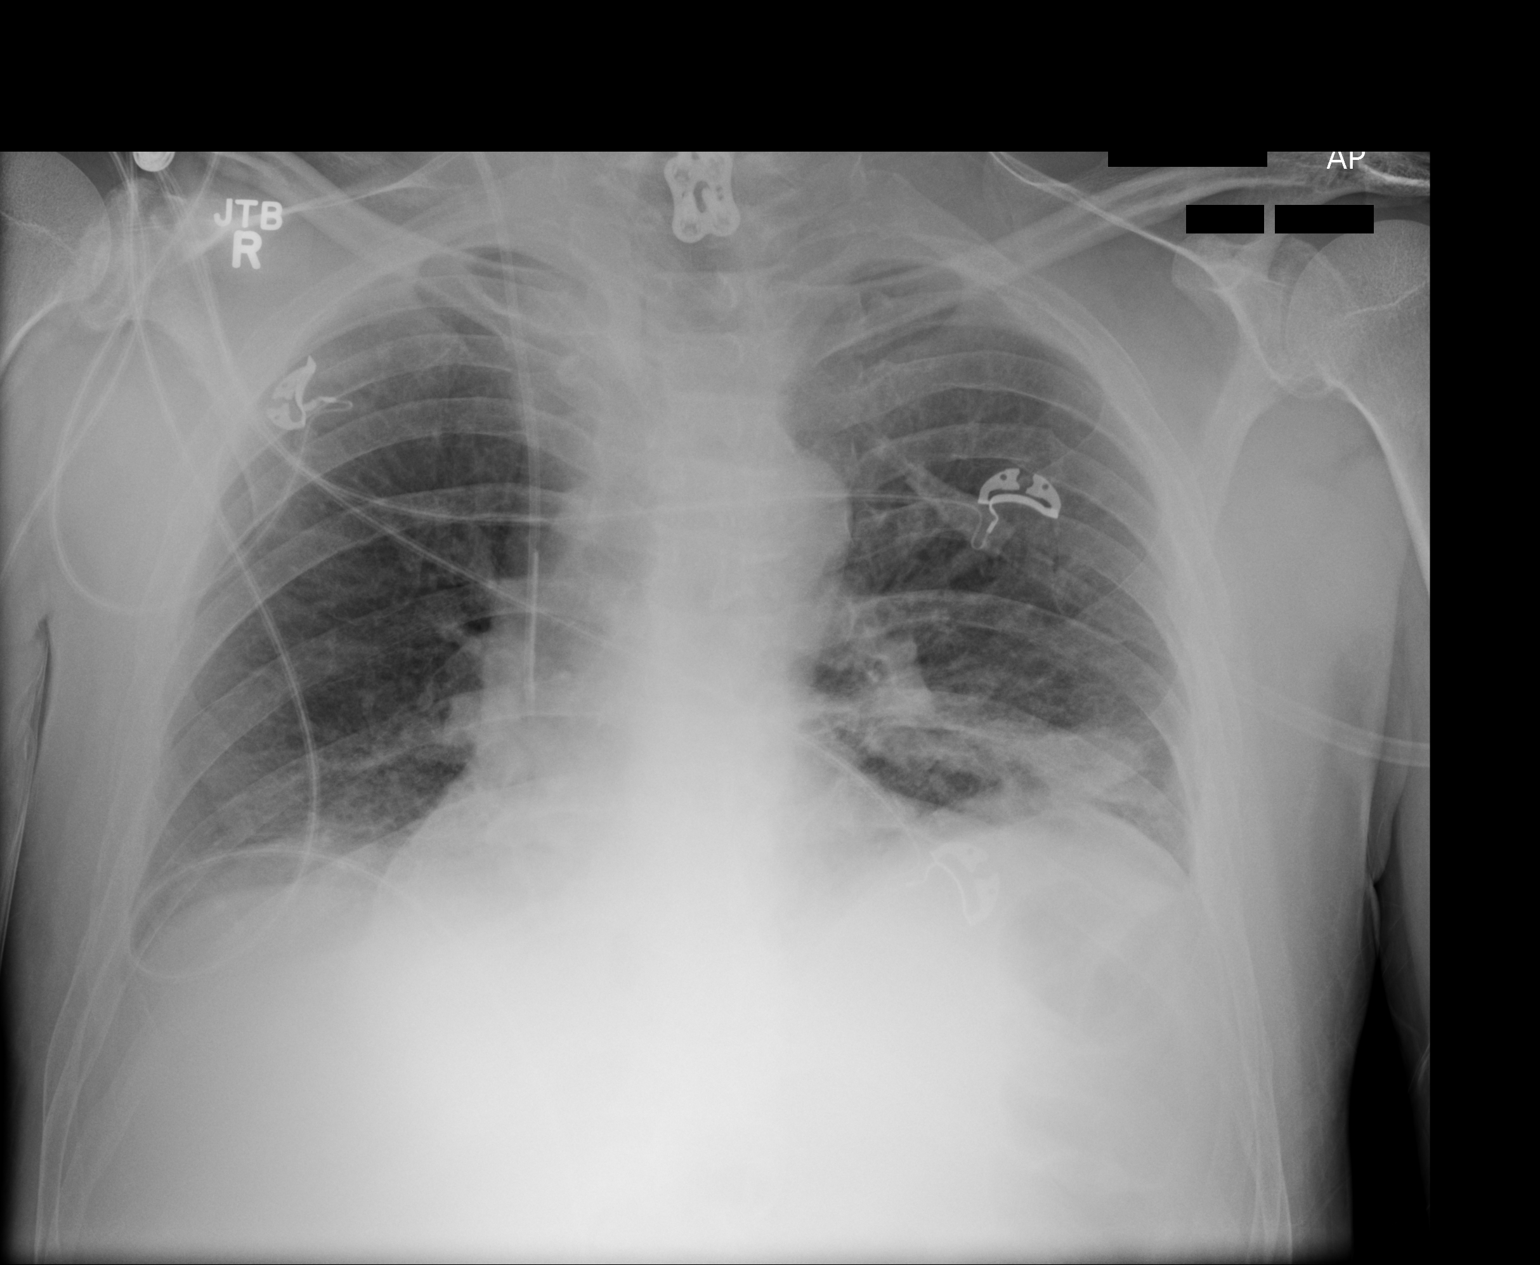

[1 of 1 positions shown; findings below may reference images not displayed]

FINDINGS: A right internal jugular central venous catheter tip projects over
the region of the distal SVC. Cervical fixation hardware is present.
There has been interval removal of an enteric tube.

The lung volumes are diminished. The cardiac silhouette and
mediastinal contours are unchanged. Increasing bibasilar opacities
are present, left greater than right. There is no pleural effusion
or pneumothorax. There is no overt pulmonary edema.

There is no acute osseous abnormality.
IMPRESSION: Hypoinflation with associated bibasilar opacities, atelectasis
versus developing pneumonitis.

## 2015-08-30 IMAGING — CR DG CHEST 1V PORT
1 series · 1 of 1 positions shown · non-contrast
Comparison: 11/24/2014

CLINICAL DATA: Endotracheal tube placement.  Reintubation

EXAM:
PORTABLE CHEST - 1 VIEW

[AP]
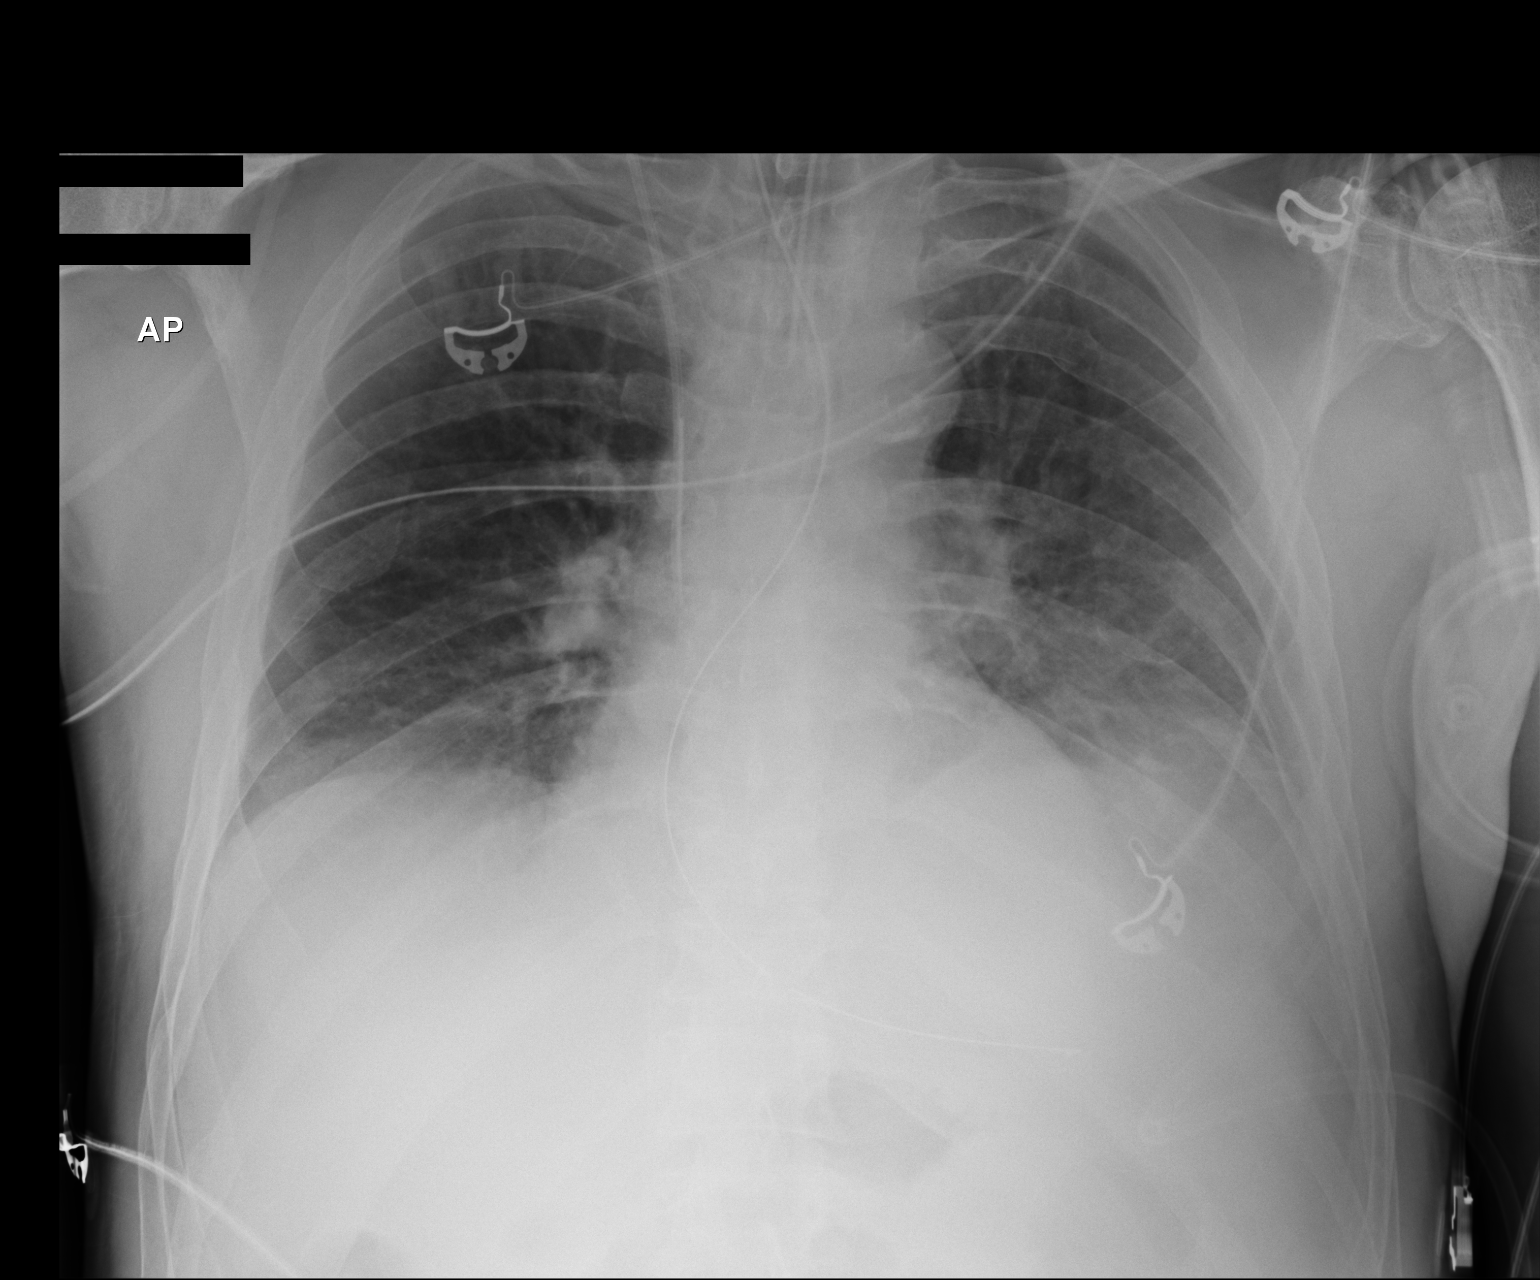

[1 of 1 positions shown; findings below may reference images not displayed]

FINDINGS: Endotracheal tube in good position. Right jugular catheter tip in
the SVC. NG tube in the stomach.

Bibasilar atelectasis similar to earlier today.  Negative for edema.
IMPRESSION: Endotracheal tube in good position.

Bibasilar atelectasis unchanged.

## 2015-08-30 SURGERY — REPAIR, HERNIA, INCISIONAL
Anesthesia: General

## 2015-08-30 MED ORDER — CEFAZOLIN SODIUM-DEXTROSE 2-3 GM-% IV SOLR
2.0000 g | INTRAVENOUS | Status: DC
  Filled 2015-08-30: qty 50

## 2015-08-30 MED ORDER — PROPOFOL 10 MG/ML IV BOLUS
INTRAVENOUS | Status: AC
  Filled 2015-08-30: qty 20

## 2015-08-30 MED ORDER — FENTANYL CITRATE (PF) 250 MCG/5ML IJ SOLN
INTRAMUSCULAR | Status: AC
Start: 2015-08-30 — End: 2015-08-30
  Filled 2015-08-30: qty 5

## 2015-08-30 MED ORDER — LACTATED RINGERS IV SOLN
INTRAVENOUS | Status: DC
  Administered 2015-08-30: 09:00:00 via INTRAVENOUS

## 2015-08-30 MED ORDER — HEPARIN SODIUM (PORCINE) 5000 UNIT/ML IJ SOLN
5000.0000 [IU] | Freq: Once | INTRAMUSCULAR | Status: AC
  Administered 2015-08-30: 5000 [IU] via SUBCUTANEOUS
  Filled 2015-08-30: qty 1

## 2015-08-30 MED ORDER — PROPOFOL 10 MG/ML IV BOLUS
INTRAVENOUS | Status: AC
Start: 2015-08-30 — End: 2015-08-30
  Filled 2015-08-30: qty 20

## 2015-08-30 MED ORDER — LIDOCAINE HCL (CARDIAC) 20 MG/ML IV SOLN
INTRAVENOUS | Status: AC
  Filled 2015-08-30: qty 5

## 2015-08-30 NOTE — Anesthesia Preprocedure Evaluation (Deleted)
Anesthesia Evaluation  Patient identified by MRN, date of birth, ID band Patient awake    Reviewed: Allergy & Precautions, H&P , NPO status , Patient's Chart, lab work & pertinent test results  Airway Mallampati: II  TM Distance: >3 FB Neck ROM: Full    Dental no notable dental hx.    Pulmonary shortness of breath, Current Smoker,    Pulmonary exam normal breath sounds clear to auscultation       Cardiovascular hypertension, Pt. on medications + CAD, + Past MI and + Peripheral Vascular Disease  Normal cardiovascular exam Rhythm:Regular Rate:Normal     Neuro/Psych Seizures -,  PSYCHIATRIC DISORDERS Anxiety Depression Bipolar Disorder Schizophrenia    GI/Hepatic GERD  ,(+) Hepatitis -, C  Endo/Other  negative endocrine ROS  Renal/GU negative Renal ROS     Musculoskeletal negative musculoskeletal ROS (+)   Abdominal   Peds  Hematology negative hematology ROS (+)   Anesthesia Other Findings   Reproductive/Obstetrics                             Anesthesia Physical  Anesthesia Plan  ASA: III  Anesthesia Plan: General   Post-op Pain Management:    Induction: Intravenous  Airway Management Planned: Oral ETT  Additional Equipment:   Intra-op Plan:   Post-operative Plan: Extubation in OR  Informed Consent: I have reviewed the patients History and Physical, chart, labs and discussed the procedure including the risks, benefits and alternatives for the proposed anesthesia with the patient or authorized representative who has indicated his/her understanding and acceptance.   Dental advisory given  Plan Discussed with: CRNA  Anesthesia Plan Comments:         Anesthesia Quick Evaluation

## 2015-08-30 NOTE — Progress Notes (Signed)
Pt case has been cancelled and voices understanding of the need to follow up with PCP. IV d/c and Pt instructed to dress.

## 2015-08-30 NOTE — Progress Notes (Signed)
Patient ID: [name], male   DOB: Dec 29, 1960, 54 y.o.   MRN: [id number] Patient with elevated creatinine on screening labs that was double his baseline. I have rechecked him today and his creatinine is still elevated with a potassium of 5.5.  I have cancelled his elective case today due to this and will get him to see his pcp this week also for labs to be rechecked and evaluate him.

## 2015-09-04 IMAGING — CR DG CHEST 1V PORT
1 series · 1 of 1 positions shown · non-contrast
Comparison: 11/28/2014

CLINICAL DATA: Acute respiratory failure

EXAM:
PORTABLE CHEST - 1 VIEW

[AP]
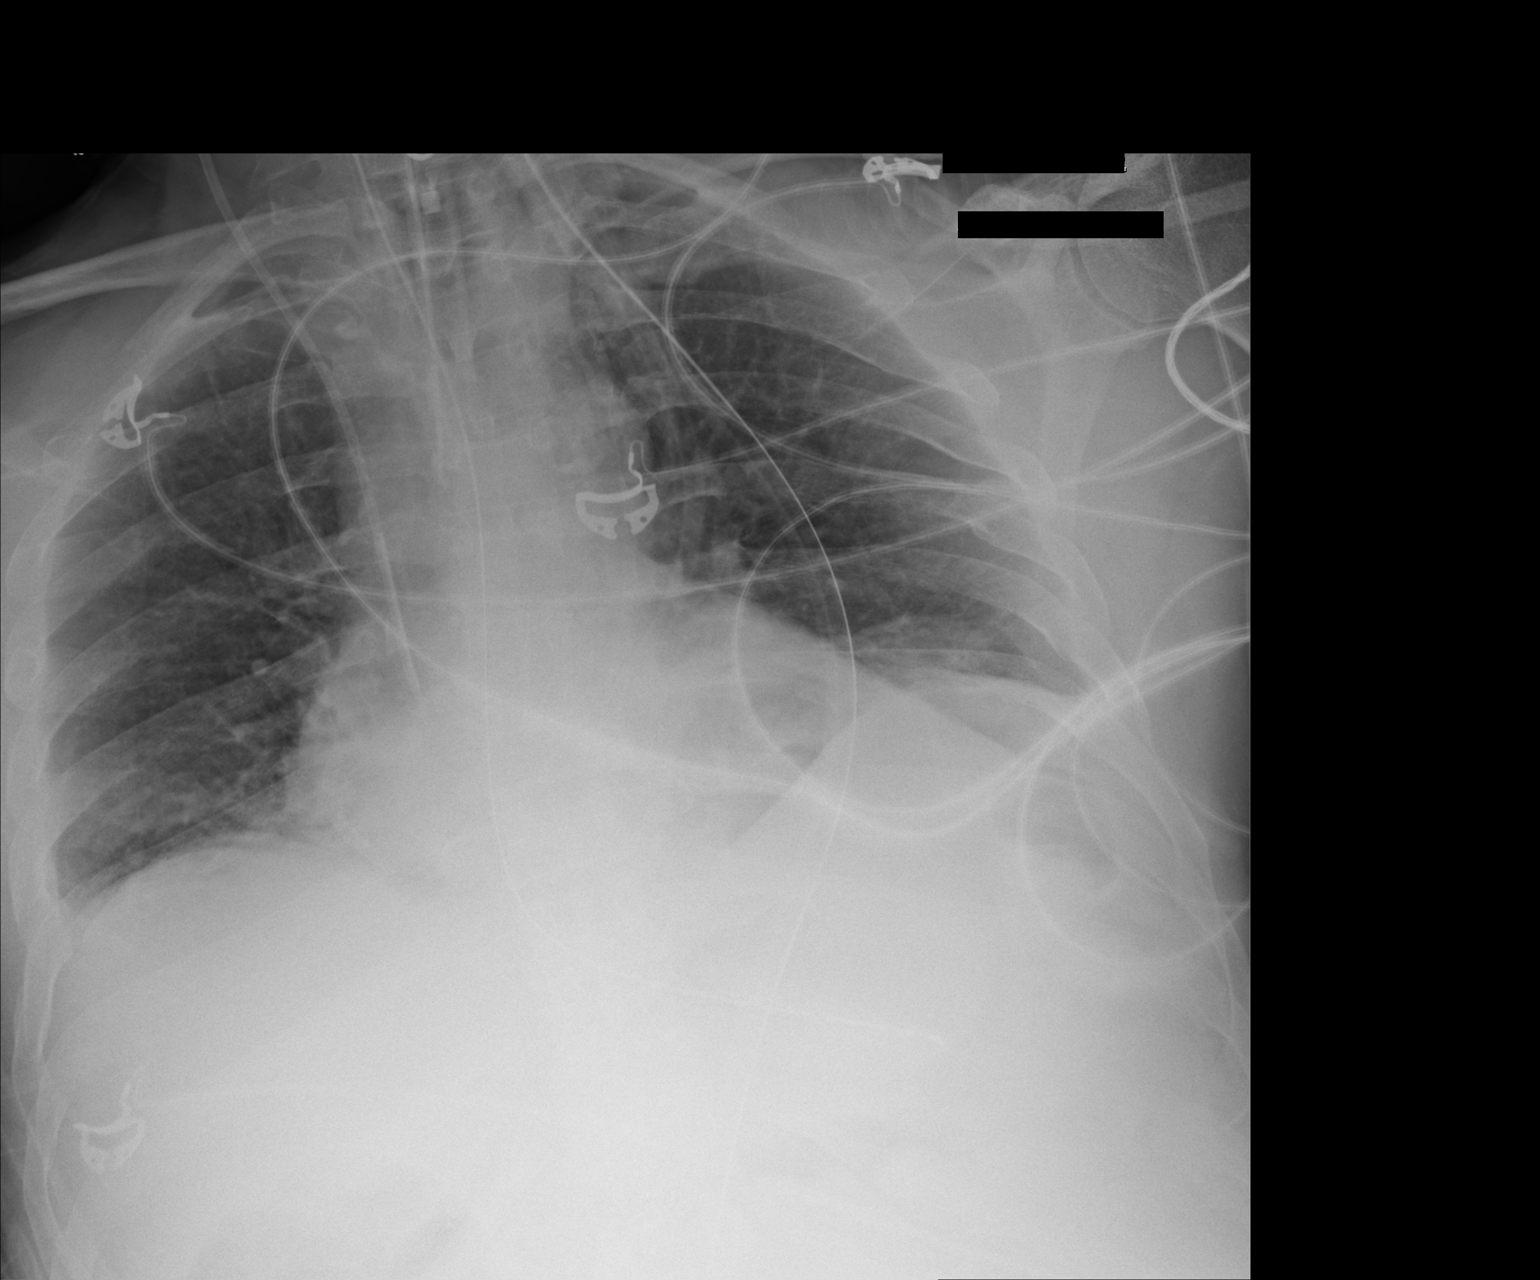

[1 of 1 positions shown; findings below may reference images not displayed]

FINDINGS: Cardiomediastinal silhouette is stable. Endotracheal tube in place
with tip 2.2 cm above the carina. Stable NG tube position with tip
in proximal stomach. Right IJ central line is unchanged in position.
No pulmonary edema. Stable mild basilar atelectasis or infiltrate.
IMPRESSION: Stable support of bilateral. Stable bilateral basilar atelectasis or
infiltrate. No pulmonary edema.

## 2015-09-13 ENCOUNTER — Encounter: Payer: Self-pay | Admitting: Family

## 2015-09-14 ENCOUNTER — Ambulatory Visit (INDEPENDENT_AMBULATORY_CARE_PROVIDER_SITE_OTHER): Payer: Medicaid Other | Admitting: Family

## 2015-09-14 ENCOUNTER — Encounter: Payer: Self-pay | Admitting: Family

## 2015-09-14 ENCOUNTER — Encounter (HOSPITAL_COMMUNITY): Payer: Medicaid Other

## 2015-09-14 VITALS — BP 126/85 | HR 78 | Temp 97.3°F | Resp 16 | Ht 67.0 in | Wt 162.0 lb

## 2015-09-14 DIAGNOSIS — Z48812 Encounter for surgical aftercare following surgery on the circulatory system: Secondary | ICD-10-CM

## 2015-09-14 DIAGNOSIS — Z95828 Presence of other vascular implants and grafts: Secondary | ICD-10-CM | POA: Diagnosis not present

## 2015-09-14 DIAGNOSIS — K432 Incisional hernia without obstruction or gangrene: Secondary | ICD-10-CM | POA: Diagnosis not present

## 2015-09-14 DIAGNOSIS — Z87891 Personal history of nicotine dependence: Secondary | ICD-10-CM | POA: Diagnosis not present

## 2015-09-14 DIAGNOSIS — M79605 Pain in left leg: Secondary | ICD-10-CM

## 2015-09-14 DIAGNOSIS — I7409 Other arterial embolism and thrombosis of abdominal aorta: Secondary | ICD-10-CM | POA: Diagnosis not present

## 2015-09-14 NOTE — Progress Notes (Signed)
[facility] [place] HISTORY AND PHYSICAL -PAD  History of Present Illness [name] is a 54 y.o. male patient of Dr. 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 He has done well since that time but has developed a bulge in the upper part of his midline abdominal incision which has enlarged.   His past medical history includes CAD, Hepatitis C, and several psychiatric disorders.   Dr. Kellie Simmering last saw pt on 04/27/15. At that time Dr. Kellie Simmering felt that his ventral hernia should probably be repaired. Dr. Kellie Simmering referred pt to Dr. Serita Grammes for further evaluation and possible ventral hernia repair.   Pt's insurance denied coverage of ABI's today.  Pt c/o left knee to hip pain that is worse at night in bed, not as bad when walking although it occasionally occurs with standing.  He denies non healing wounds.  Pt also denies any known history of stroke or TIA.   He states he could not have his ventral hernia repaired due to his kidney function. The ventral hernia is not incarcerated.  He then saw his PCP who stopped his lisinopril, apparently working to try to improve his renal function so he may have the ventral hernia repair.   Pt Diabetic: No Pt smoker: former smoker, quit in June 2016  Pt meds include: Statin :Yes Betablocker: No ASA: Yes Other anticoagulants/antiplatelets: no    Past Medical History  Diagnosis Date  . Hypertension   . Hyperlipidemia   . Myocardial infarction ([place]) 1996; 1997; 2000's    "total of 3" (10/22/2012)  . Hepatitis C     "diagnosed in the past 2 wk" (10/22/2012)  . Seizures ([place])     "when I get anxious" (10/22/2012)  . Anxiety   . Depression   . Schizophrenia ([place])   . PAD (peripheral artery disease) ([place])     2010 PTA & stent to left CIA & left SFA; 05/2012 PTA & stent to distal LCIA; 10/2012 thrombectomy and stents to [place]  . History of ETOH abuse     quit 2007  . Tobacco abuse   . Broken fingers   . Coronary artery disease     s/p PCI to RCA at [facility]; s/p PCI to RCA '08; Cath 05/2012 showed known occluded LCx, RCA stent w/ mild restenosis, LAD no significant dz, EF 35% . Cardiac cath in July of 2014 showed no significant change.   Marland Kitchen GERD (gastroesophageal reflux disease)     takes Protonix daily  . Bipolar affective ([place])     takes Lithium daily    Social History Social History  Substance Use Topics  . Smoking status: Former Smoker -- 1.00 packs/day for 40 years    Types: Cigarettes    Quit date: 05/15/2015  . Smokeless tobacco: Never Used  . Alcohol Use: No     Comment: 10/22/2012 "last drink was 5-7 yr ago; used to have a problem w/it"    Family History Family History  Problem Relation Age of Onset  . Emphysema Father   . Diabetes Other   . Alcohol abuse Other   . Hypertension Other   . Hyperlipidemia Other   . Seizures Other     Past Surgical History  Procedure Laterality Date  . Subdural hematoma evacuation via craniotomy    . Anterior cervical decomp/discectomy fusion  01/2011    plate & screw placement   .  Iliac artery stent  10/22/2012    "2" (10/22/2012)  . Brain surgery    . Posterior fusion cervical spine  2012  . Liver biopsy  10/2012  . Cardiac catheterization      Stent - 12  . Coronary angioplasty with stent placement  03/2007    to RCA  . Iliac artery stent  06/2012    left  . Left heart catheterization with coronary angiogram N/A 05/29/2012    Procedure: LEFT HEART CATHETERIZATION WITH CORONARY ANGIOGRAM;  Surgeon: Wellington Hampshire, MD;  Location: [facility] CATH LAB;  Service: Cardiovascular;  Laterality: N/A;  . Lower extremity angiogram N/A 05/29/2012    Procedure: LOWER EXTREMITY ANGIOGRAM;  Surgeon: Wellington Hampshire, MD;  Location: [facility] CATH LAB;  Service: Cardiovascular;  Laterality: N/A;  . Percutaneous stent intervention Left 05/29/2012    Procedure: PERCUTANEOUS  STENT INTERVENTION;  Surgeon: Wellington Hampshire, MD;  Location: [place] CATH LAB;  Service: Cardiovascular;  Laterality: Left;  . Abdominal aortagram N/A 10/22/2012    Procedure: ABDOMINAL Maxcine Ham;  Surgeon: Wellington Hampshire, MD;  Location[facility] C[facility] LAB;  Service: Cardiovascular;  Laterality: N/A;  . Abdominal aortagram N/A 11/11/2014    Procedure: ABDOMINAL Maxcine Ham;  Surgeon: Wellington Hampshire, MD;  Location: [place] CATH LAB;  Service: Cardiovascular;  Laterality: N/A;  . Aorta - bilateral femoral artery bypass graft N/A 11/20/2014    Procedure: AORTA BIFEMORAL BYPASS GRAFT;  Surgeon: Mal Misty, MD;  Location: [facility] OR;  Service: Vascular;  Laterality: N/A;    No Known Allergies  Current Outpatient Prescriptions  Medication Sig Dispense Refill  . alprazolam (XANAX) 2 MG tablet Take 0.5 tablets (1 mg total) by mouth 2 (two) times daily as needed for sleep or anxiety. (Patient not taking: Reported on 08/23/2015) 30 tablet 0  . aspirin 81 MG EC tablet Take 81 mg by mouth daily.      . divalproex (DEPAKOTE ER) 500 MG 24 hr tablet Take 500 mg by mouth 3 (three) times daily.  2  . lisinopril (PRINIVIL,ZESTRIL) 20 MG tablet Take 1 tablet (20 mg total) by mouth daily. 30 tablet 6  . lithium carbonate (ESKALITH) 450 MG CR tablet Take 450 mg by mouth 2 (two) times daily.     Marland Kitchen oxyCODONE-acetaminophen (ROXICET) 5-325 MG per tablet Take 1 tablet by mouth every 4 (four) hours as needed for severe pain. (Patient not taking: Reported on 08/23/2015) 30 tablet 0  . pantoprazole (PROTONIX) 40 MG tablet Take 40 mg by mouth daily before breakfast.  3  . simvastatin (ZOCOR) 10 MG tablet Take 1 tablet (10 mg total) by mouth daily. (Patient not taking: Reported on 08/23/2015) 30 tablet 5   No current facility-administered medications for this visit.    ROS: See HPI for pertinent positives and negatives.   Physical Examination  Filed Vitals:   09/14/15 1553  BP: 126/85  Pulse: 78  Temp: 97.3 F (36.3 C)  Resp: 16   Height: 5\' 7"  (1.702 m)  Weight: 162 lb (73.483 kg)  SpO2: 100%   Body mass index is 25.37 kg/(m^2).  General: A&O x 3, WDWN. Gait: normal Eyes: PERRLA. Pulmonary: CTAB, without wheezes , rales or rhonchi. Cardiac: regular rhythm, no detected murmur.         Carotid Bruits Right Left   Negative Negative  Aorta is not palpable. Radial pulses: 1+ palpable and =  VASCULAR EXAM: Extremities without ischemic changes, without Gangrene; without open wounds.                                                                                                          LE Pulses Right Left       FEMORAL  3+ palpable  3+ palpable        POPLITEAL  not palpable   not palpable       POSTERIOR TIBIAL  not palpable   not palpable        DORSALIS PEDIS      ANTERIOR TIBIAL 2+ palpable  2+ palpable    Abdomen: soft, NT, moderate sized reducible midline incisional ventral hernia. Skin: no rashes, no ulcers. Musculoskeletal: no muscle wasting or atrophy.  Neurologic: A&O X 3; Appropriate Affect; MOTOR FUNCTION:  moving all extremities equally, motor strength 5/5 throughout. Speech is fluent/normal. CN 2-12 intact. He has a chewing type repetitive motion of his mouth.    ASSESSMENT: [name] is a 54 y.o. 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 He has done well since that time but has developed a bulge in the upper part of his midline abdominal incision which has enlarged.  His insurance denied coverage of ABI's today. He c/o left knee to hip pain that is worse at night in bed, not as bad when walking although it occasionally occurs with standing.  He denies non healing wounds.     Discussed with Dr. Donnetta Hutching. Since pt has a 2+ palpable left dorsalis pedis pulse, his left leg pain is not due to lack of arterial perfusion. His feet are pink and warm. Bilateral straight  leg raise did not elicit pain; he denies any known lower back problem.  Pt advised to discuss his left leg pain with his PCP.   PLAN:  Based on the patient's vascular studies and examination, pt will return to clinic whenever his insurance approves ABI's and see me for discussion of results.   I discussed in depth with the patient the nature of atherosclerosis, and emphasized the importance of maximal medical management including strict control of blood pressure, blood glucose, and lipid levels, obtaining regular exercise, and continued cessation of smoking.  The patient is aware that without maximal medical management the underlying atherosclerotic disease process will progress, limiting the benefit of any interventions.  The patient was given information about PAD including signs, symptoms, treatment, what symptoms should prompt the patient to seek immediate medical care, and risk reduction measures to take.  Clemon Chambers, RN, MSN, FNP-C [facility] [employer] [facility] Phone: [phone]  Clinic MD: Early  09/14/2015 3:55 PM

## 2015-09-14 NOTE — Patient Instructions (Signed)
Peripheral Vascular Disease Peripheral vascular disease (PVD) is a disease of the blood vessels that are not part of your heart and brain. A simple term for PVD is poor circulation. In most cases, PVD narrows the blood vessels that carry blood from your heart to the rest of your body. This can result in a decreased supply of blood to your arms, legs, and internal organs, like your stomach or kidneys. However, it most often affects a person's lower legs and feet. There are two types of PVD.  Organic PVD. This is the more common type. It is caused by damage to the structure of blood vessels.  Functional PVD. This is caused by conditions that make blood vessels contract and tighten (spasm). Without treatment, PVD tends to get worse over time. PVD can also lead to acute ischemic limb. This is when an arm or limb suddenly has trouble getting enough blood. This is a medical emergency. CAUSES Each type of PVD has many different causes. The most common cause of PVD is buildup of a fatty material (plaque) inside of your arteries (atherosclerosis). Small amounts of plaque can break off from the walls of the blood vessels and become lodged in a smaller artery. This blocks blood flow and can cause acute ischemic limb. Other common causes of PVD include:  Blood clots that form inside of blood vessels.  Injuries to blood vessels.  Diseases that cause inflammation of blood vessels or cause blood vessel spasms.  Health behaviors and health history that increase your risk of developing PVD. RISK FACTORS  You may have a greater risk of PVD if you:  Have a family history of PVD.  Have certain medical conditions, including:  High cholesterol.  Diabetes.  High blood pressure (hypertension).  Coronary heart disease.  Past problems with blood clots.  Past injury, such as burns or a broken bone. These may have damaged blood vessels in your limbs.  Buerger disease. This is caused by inflamed blood  vessels in your hands and feet.  Some forms of arthritis.  Rare birth defects that affect the arteries in your legs.  Use tobacco.  Do not get enough exercise.  Are obese.  Are age 50 or older. SIGNS AND SYMPTOMS  PVD may cause many different symptoms. Your symptoms depend on what part of your body is not getting enough blood. Some common signs and symptoms include:  Cramps in your lower legs. This may be a symptom of poor leg circulation (claudication).  Pain and weakness in your legs while you are physically active that goes away when you rest (intermittent claudication).  Leg pain when at rest.  Leg numbness, tingling, or weakness.  Coldness in a leg or foot, especially when compared with the other leg.  Skin or hair changes. These can include:  Hair loss.  Shiny skin.  Pale or bluish skin.  Thick toenails.  Inability to get or maintain an erection (erectile dysfunction). People with PVD are more prone to developing ulcers and sores on their toes, feet, or legs. These may take longer than normal to heal. DIAGNOSIS Your health care provider may diagnose PVD from your signs and symptoms. The health care provider will also do a physical exam. You may have tests to find out what is causing your PVD and determine its severity. Tests may include:  Blood pressure recordings from your arms and legs and measurements of the strength of your pulses (pulse volume recordings).  Imaging studies using sound waves to take pictures of   the blood flow through your blood vessels (Doppler ultrasound).  Injecting a dye into your blood vessels before having imaging studies using:  X-rays (angiogram or arteriogram).  Computer-generated X-rays (CT angiogram).  A powerful electromagnetic field and a computer (magnetic resonance angiogram or MRA). TREATMENT Treatment for PVD depends on the cause of your condition and the severity of your symptoms. It also depends on your age. Underlying  causes need to be treated and controlled. These include long-lasting (chronic) conditions, such as diabetes, high cholesterol, and high blood pressure. You may need to first try making lifestyle changes and taking medicines. Surgery may be needed if these do not work. Lifestyle changes may include:  Quitting smoking.  Exercising regularly.  Following a low-fat, low-cholesterol diet. Medicines may include:  Blood thinners to prevent blood clots.  Medicines to improve blood flow.  Medicines to improve your blood cholesterol levels. Surgical procedures may include:  A procedure that uses an inflated balloon to open a blocked artery and improve blood flow (angioplasty).  A procedure to put in a tube (stent) to keep a blocked artery open (stent implant).  Surgery to reroute blood flow around a blocked artery (peripheral bypass surgery).  Surgery to remove dead tissue from an infected wound on the affected limb.  Amputation. This is surgical removal of the affected limb. This may be necessary in cases of acute ischemic limb that are not improved through medical or surgical treatments. HOME CARE INSTRUCTIONS  Take medicines only as directed by your health care provider.  Do not use any tobacco products, including cigarettes, chewing tobacco, or electronic cigarettes. If you need help quitting, ask your health care provider.  Lose weight if you are overweight, and maintain a healthy weight as directed by your health care provider.  Eat a diet that is low in fat and cholesterol. If you need help, ask your health care provider.  Exercise regularly. Ask your health care provider to suggest some good activities for you.  Use compression stockings or other mechanical devices as directed by your health care provider.  Take good care of your feet.  Wear comfortable shoes that fit well.  Check your feet often for any cuts or sores. SEEK MEDICAL CARE IF:  You have cramps in your legs  while walking.  You have leg pain when you are at rest.  You have coldness in a leg or foot.  Your skin changes.  You have erectile dysfunction.  You have cuts or sores on your feet that are not healing. SEEK IMMEDIATE MEDICAL CARE IF:  Your arm or leg turns cold and blue.  Your arms or legs become red, warm, swollen, painful, or numb.  You have chest pain or trouble breathing.  You suddenly have weakness in your face, arm, or leg.  You become very confused or lose the ability to speak.  You suddenly have a very bad headache or lose your vision.   This information is not intended to replace advice given to you by your health care provider. Make sure you discuss any questions you have with your health care provider.   Document Released: 12/28/2004 Document Revised: 12/11/2014 Document Reviewed: 04/30/2014 Elsevier Interactive Patient Education 2016 [employer]  

## 2015-09-30 ENCOUNTER — Encounter: Payer: Self-pay | Admitting: Family

## 2015-09-30 ENCOUNTER — Ambulatory Visit (HOSPITAL_COMMUNITY)
Admission: RE | Admit: 2015-09-30 | Discharge: 2015-09-30 | Disposition: A | Discharge status: Home / Self Care | Payer: Medicaid Other | Source: Ambulatory Visit | Attending: Vascular Surgery | Admitting: Vascular Surgery

## 2015-09-30 DIAGNOSIS — I739 Peripheral vascular disease, unspecified: Secondary | ICD-10-CM | POA: Diagnosis present

## 2015-10-05 ENCOUNTER — Ambulatory Visit: Payer: Self-pay | Admitting: Family

## 2015-10-08 ENCOUNTER — Other Ambulatory Visit: Payer: Self-pay | Admitting: General Surgery

## 2015-11-02 ENCOUNTER — Encounter (HOSPITAL_COMMUNITY)
Admission: RE | Admit: 2015-11-02 | Discharge: 2015-11-02 | Disposition: A | Discharge status: Home / Self Care | Payer: Medicaid Other | Source: Ambulatory Visit | Attending: General Surgery | Admitting: General Surgery

## 2015-11-02 ENCOUNTER — Encounter (HOSPITAL_COMMUNITY): Payer: Self-pay

## 2015-11-02 DIAGNOSIS — I10 Essential (primary) hypertension: Secondary | ICD-10-CM | POA: Insufficient documentation

## 2015-11-02 DIAGNOSIS — F209 Schizophrenia, unspecified: Secondary | ICD-10-CM | POA: Insufficient documentation

## 2015-11-02 DIAGNOSIS — Z87891 Personal history of nicotine dependence: Secondary | ICD-10-CM | POA: Diagnosis not present

## 2015-11-02 DIAGNOSIS — R569 Unspecified convulsions: Secondary | ICD-10-CM | POA: Diagnosis not present

## 2015-11-02 DIAGNOSIS — F319 Bipolar disorder, unspecified: Secondary | ICD-10-CM | POA: Insufficient documentation

## 2015-11-02 DIAGNOSIS — I2510 Atherosclerotic heart disease of native coronary artery without angina pectoris: Secondary | ICD-10-CM | POA: Insufficient documentation

## 2015-11-02 DIAGNOSIS — K219 Gastro-esophageal reflux disease without esophagitis: Secondary | ICD-10-CM | POA: Diagnosis not present

## 2015-11-02 DIAGNOSIS — Z01818 Encounter for other preprocedural examination: Secondary | ICD-10-CM | POA: Insufficient documentation

## 2015-11-02 DIAGNOSIS — K432 Incisional hernia without obstruction or gangrene: Secondary | ICD-10-CM | POA: Insufficient documentation

## 2015-11-02 DIAGNOSIS — Z79899 Other long term (current) drug therapy: Secondary | ICD-10-CM | POA: Diagnosis not present

## 2015-11-02 DIAGNOSIS — I252 Old myocardial infarction: Secondary | ICD-10-CM | POA: Diagnosis not present

## 2015-11-02 DIAGNOSIS — E785 Hyperlipidemia, unspecified: Secondary | ICD-10-CM | POA: Diagnosis not present

## 2015-11-02 DIAGNOSIS — Z95828 Presence of other vascular implants and grafts: Secondary | ICD-10-CM | POA: Insufficient documentation

## 2015-11-02 DIAGNOSIS — Z01812 Encounter for preprocedural laboratory examination: Secondary | ICD-10-CM | POA: Insufficient documentation

## 2015-11-02 HISTORY — DX: Frequency of micturition: R35.0

## 2015-11-02 LAB — COMPREHENSIVE METABOLIC PANEL
ALT: 16 U/L — ABNORMAL LOW (ref 17–63)
AST: 19 U/L (ref 15–41)
Albumin: 4.1 g/dL (ref 3.5–5.0)
Alkaline Phosphatase: 40 U/L (ref 38–126)
Anion gap: 6 (ref 5–15)
BUN: 12 mg/dL (ref 6–20)
CO2: 26 mmol/L (ref 22–32)
Calcium: 9.8 mg/dL (ref 8.9–10.3)
Chloride: 105 mmol/L (ref 101–111)
Creatinine, Ser: 1.01 mg/dL (ref 0.61–1.24)
GFR calc Af Amer: >60 mL/min (ref >60)
GFR calc non Af Amer: >60 mL/min (ref >60)
Glucose, Bld: 97 mg/dL (ref 65–99)
Potassium: 4.2 mmol/L (ref 3.5–5.1)
Sodium: 137 mmol/L (ref 135–145)
Total Bilirubin: 0.7 mg/dL (ref 0.3–1.2)
Total Protein: 7 g/dL (ref 6.5–8.1)

## 2015-11-02 LAB — URINALYSIS, ROUTINE W REFLEX MICROSCOPIC
Bilirubin Urine: NEGATIVE
Glucose, UA: NEGATIVE mg/dL
Hgb urine dipstick: NEGATIVE
Ketones, ur: NEGATIVE mg/dL
Leukocytes, UA: NEGATIVE
Nitrite: NEGATIVE
Protein, ur: NEGATIVE mg/dL
Specific Gravity, Urine: 1.025 (ref 1.005–1.030)
pH: 5.5 (ref 5.0–8.0)

## 2015-11-02 LAB — CBC WITH DIFFERENTIAL/PLATELET
Basophils Absolute: 0 10*3/uL (ref 0.0–0.1)
Basophils Relative: 1 %
Eosinophils Absolute: 0.2 10*3/uL (ref 0.0–0.7)
Eosinophils Relative: 3 %
HCT: 44.9 % (ref 39.0–52.0)
Hemoglobin: 15.4 g/dL (ref 13.0–17.0)
Lymphocytes Relative: 26 %
Lymphs Abs: 1.9 10*3/uL (ref 0.7–4.0)
MCH: 32 pg (ref 26.0–34.0)
MCHC: 34.3 g/dL (ref 30.0–36.0)
MCV: 93.3 fL (ref 78.0–100.0)
Monocytes Absolute: 0.8 10*3/uL (ref 0.1–1.0)
Monocytes Relative: 11 %
Neutro Abs: 4.3 10*3/uL (ref 1.7–7.7)
Neutrophils Relative %: 59 %
Platelets: 130 10*3/uL — ABNORMAL LOW (ref 150–400)
RBC: 4.81 MIL/uL (ref 4.22–5.81)
RDW: 12.8 % (ref 11.5–15.5)
WBC: 7.2 10*3/uL (ref 4.0–10.5)

## 2015-11-02 LAB — PROTIME-INR
INR: 1.04 (ref 0.00–1.49)
Prothrombin Time: 13.8 seconds (ref 11.6–15.2)

## 2015-11-02 NOTE — Progress Notes (Signed)
Patient states that surgery was originally scheduled for September but was cancelled day of surgery due to elevated kidney levels (see Dr. Cristal Generous note - elevated creatinine and potassium).  Patient states that he stopped taking Lisinopril.

## 2015-11-02 NOTE — Progress Notes (Signed)
PCP -Dr. Gayland Curry Cardiologist - Dr. Hedy Jacob  EKG- 06/10/2015 - Epic CXR - 1 view - 11/2014  Echo- 2006 - Care Everywhere Stress test - 2015 - Epic Cardiac Cath - 2014 - Epic  Patient denies chest pain and shortness of breath at PAT appointment.

## 2015-11-02 NOTE — Pre-Procedure Instructions (Signed)
    [name]  11/02/2015      [id number] - [name], [place] - [address] [place] [address] Phone: [phone] Fax: [phone]    Your procedure is scheduled on Monday, December 5th, 2016.  Report to [facility] Admitting at 7:30 A.M.  Call this number if you have problems the morning of surgery:  [phone]   Remember:  Do not eat food or drink liquids after midnight.   Take these medicines the morning of surgery with A SIP OF WATER: Depakote, Lithium Carbonate, and Pantoprazole (Protonix).  Stop taking: Aspirin, NSAIDS, Aleve, Naproxen, Ibuprofen, Advil, Motrin, BC's, Goody's, Fish oil, all herbal medications, and all vitamins.    Do not wear jewelry.  Do not wear lotions, powders, or colognes.  You may wear deodorant.   Men may shave face and neck.  Do not bring valuables to the hospital.  [facility] is not responsible for any belongings or valuables.  Contacts, dentures or bridgework may not be worn into surgery.  Leave your suitcase in the car.  After surgery it may be brought to your room.  For patients admitted to the hospital, discharge time will be determined by your treatment team.  Patients discharged the day of surgery will not be allowed to drive home.   Special instructions:  See attached.   Please read over the following fact sheets that you were given. Pain Booklet, Coughing and Deep Breathing and Surgical Site Infection Prevention

## 2015-11-03 NOTE — Progress Notes (Signed)
Anesthesia Chart Review:  Pt is 54 year old male scheduled for incisional hernia repair with mesh, possible myofascial release on 11/08/2015 with Dr. Donne Hazel.   Cardiologist is Dr. Kathlyn Sacramento, last office visit 06/10/15; Dr. Fletcher Anon clears pt for hernia repair surgery in that note at low to moderate risk for cardiovascular complications.   PMH includes:  CAD (PCI to RCA 1997 and 2008), MI (x3), PAD (2010 PTA & stent to left CIA & left SFA; 05/2012 PTA & stent to distal LCIA; 10/2012 thrombectomy and stents to LCIA and LEIA), HTN, hyperlipidemia, hepatitis C, seizures, history alcohol abuse, schizophrenia, bipolar affective disorder, GERD. Former smoker. BMI 25. S/p aorta bifem bypass graft 11/20/14.   Pt had significant problems with confusion and delirium s/p aorta bifem bypass graft last year. Notes indicate it was suspected these symptoms were due to alcohol abuse and psychiatric medications.   Medications: depakote, lithium, protonix.   Preoperative labs reviewed.    1 view CXR 11/29/14: Stable mild bibasilar atelectasis or infiltrate.   EKG 06/10/15: Sinus  Rhythm. LAFB. Nonspecific T-abnormality.   Nuclear stress test 11/16/14:  1. Overall moderate risk scan 2. Moderate scar in the inferior and inferolateral territory. No significant ischemia.  3. EF 40% 4. LV global function mildly reduced 5. There is GI uptake artifact noted on this study  Cardiac cath 06/12/13:  - significant 2 vessel CAD with patent RCA stent and chronically occluded L CX.  - Global LV function moderately depressed. EF 40%.   In reviewing records, it appears pt is also supposed to be taking lipitor (atorvastatin) and carvedilol.  I called and spoke with pt. He is not currently taking those medications but understands he is supposed to be taking them. Instructed pt to restart meds and he verbalizes agreement.   If no changes, I anticipate pt can proceed with surgery as scheduled.   Willeen Cass, FNP-BC [facility]  [facility]/Anesthesiology Phone: [phone] 11/03/2015 3:30 PM

## 2015-11-07 MED ORDER — CEFAZOLIN SODIUM-DEXTROSE 2-3 GM-% IV SOLR
2.0000 g | INTRAVENOUS | Status: AC
  Administered 2015-11-08: 2 g via INTRAVENOUS
  Filled 2015-11-07: qty 50

## 2015-11-08 ENCOUNTER — Inpatient Hospital Stay (HOSPITAL_COMMUNITY)
Admission: RE | Admit: 2015-11-08 | Discharge: 2015-11-14 | DRG: 355 | Disposition: A | Discharge status: Home / Self Care | Payer: Medicaid Other | Source: Ambulatory Visit | Attending: General Surgery | Admitting: General Surgery

## 2015-11-08 ENCOUNTER — Encounter (HOSPITAL_COMMUNITY): Payer: Self-pay | Admitting: Certified Registered Nurse Anesthetist

## 2015-11-08 ENCOUNTER — Inpatient Hospital Stay (HOSPITAL_COMMUNITY): Payer: Medicaid Other | Admitting: Anesthesiology

## 2015-11-08 ENCOUNTER — Encounter (HOSPITAL_COMMUNITY)
Admission: RE | Disposition: A | Discharge status: Home / Self Care | Payer: Self-pay | Source: Ambulatory Visit | Attending: General Surgery

## 2015-11-08 ENCOUNTER — Inpatient Hospital Stay (HOSPITAL_COMMUNITY): Payer: Medicaid Other | Admitting: Emergency Medicine

## 2015-11-08 DIAGNOSIS — I739 Peripheral vascular disease, unspecified: Secondary | ICD-10-CM | POA: Diagnosis present

## 2015-11-08 DIAGNOSIS — Z833 Family history of diabetes mellitus: Secondary | ICD-10-CM | POA: Diagnosis not present

## 2015-11-08 DIAGNOSIS — F209 Schizophrenia, unspecified: Secondary | ICD-10-CM | POA: Diagnosis present

## 2015-11-08 DIAGNOSIS — I10 Essential (primary) hypertension: Secondary | ICD-10-CM | POA: Diagnosis present

## 2015-11-08 DIAGNOSIS — F319 Bipolar disorder, unspecified: Secondary | ICD-10-CM | POA: Diagnosis present

## 2015-11-08 DIAGNOSIS — F1021 Alcohol dependence, in remission: Secondary | ICD-10-CM | POA: Diagnosis present

## 2015-11-08 DIAGNOSIS — E785 Hyperlipidemia, unspecified: Secondary | ICD-10-CM | POA: Diagnosis present

## 2015-11-08 DIAGNOSIS — K660 Peritoneal adhesions (postprocedural) (postinfection): Secondary | ICD-10-CM | POA: Diagnosis present

## 2015-11-08 DIAGNOSIS — Z87891 Personal history of nicotine dependence: Secondary | ICD-10-CM

## 2015-11-08 DIAGNOSIS — K432 Incisional hernia without obstruction or gangrene: Secondary | ICD-10-CM | POA: Diagnosis present

## 2015-11-08 DIAGNOSIS — K219 Gastro-esophageal reflux disease without esophagitis: Secondary | ICD-10-CM | POA: Diagnosis present

## 2015-11-08 DIAGNOSIS — Z95820 Peripheral vascular angioplasty status with implants and grafts: Secondary | ICD-10-CM | POA: Diagnosis not present

## 2015-11-08 DIAGNOSIS — F419 Anxiety disorder, unspecified: Secondary | ICD-10-CM | POA: Diagnosis present

## 2015-11-08 DIAGNOSIS — I252 Old myocardial infarction: Secondary | ICD-10-CM

## 2015-11-08 DIAGNOSIS — Z825 Family history of asthma and other chronic lower respiratory diseases: Secondary | ICD-10-CM

## 2015-11-08 DIAGNOSIS — Z7902 Long term (current) use of antithrombotics/antiplatelets: Secondary | ICD-10-CM | POA: Diagnosis not present

## 2015-11-08 DIAGNOSIS — J9811 Atelectasis: Secondary | ICD-10-CM

## 2015-11-08 DIAGNOSIS — I2510 Atherosclerotic heart disease of native coronary artery without angina pectoris: Secondary | ICD-10-CM | POA: Diagnosis present

## 2015-11-08 DIAGNOSIS — Z8249 Family history of ischemic heart disease and other diseases of the circulatory system: Secondary | ICD-10-CM

## 2015-11-08 HISTORY — PX: INSERTION OF MESH: SHX5868

## 2015-11-08 HISTORY — PX: INCISIONAL HERNIA REPAIR: SHX193

## 2015-11-08 SURGERY — REPAIR, HERNIA, INCISIONAL
Anesthesia: Epidural | Site: Abdomen

## 2015-11-08 MED ORDER — METHOCARBAMOL 500 MG PO TABS
500.0000 mg | ORAL_TABLET | Freq: Four times a day (QID) | ORAL | Status: DC | PRN
  Administered 2015-11-09 – 2015-11-12 (×5): 500 mg via ORAL
  Filled 2015-11-08 (×5): qty 1

## 2015-11-08 MED ORDER — ROPIVACAINE HCL 5 MG/ML IJ SOLN
INTRAMUSCULAR | Status: DC | PRN
  Administered 2015-11-08 (×2): 5 mL via EPIDURAL

## 2015-11-08 MED ORDER — HEPARIN SODIUM (PORCINE) 5000 UNIT/ML IJ SOLN: 5000.0000 [IU] | Freq: Three times a day (TID) | INTRAMUSCULAR | Status: DC

## 2015-11-08 MED ORDER — MIDAZOLAM HCL 5 MG/5ML IJ SOLN
INTRAMUSCULAR | Status: DC | PRN
  Administered 2015-11-08: 1 mg via INTRAVENOUS

## 2015-11-08 MED ORDER — ROPIVACAINE HCL 2 MG/ML IJ SOLN
6.0000 mL/h | INTRAMUSCULAR | Status: DC
  Administered 2015-11-08: 6 mL/h via EPIDURAL
  Filled 2015-11-08 (×2): qty 200

## 2015-11-08 MED ORDER — HEPARIN SODIUM (PORCINE) 5000 UNIT/ML IJ SOLN
5000.0000 [IU] | Freq: Once | INTRAMUSCULAR | Status: AC
  Administered 2015-11-08: 5000 [IU] via SUBCUTANEOUS
  Filled 2015-11-08: qty 1

## 2015-11-08 MED ORDER — ACETAMINOPHEN 10 MG/ML IV SOLN
1000.0000 mg | Freq: Four times a day (QID) | INTRAVENOUS | Status: AC
Start: 2015-11-08 — End: 2015-11-09
  Administered 2015-11-08 – 2015-11-09 (×4): 1000 mg via INTRAVENOUS
  Filled 2015-11-08 (×4): qty 100

## 2015-11-08 MED ORDER — DIVALPROEX SODIUM ER 500 MG PO TB24
1500.0000 mg | ORAL_TABLET | Freq: Every day | ORAL | Status: DC
  Administered 2015-11-09 – 2015-11-14 (×6): 1500 mg via ORAL
  Filled 2015-11-08 (×6): qty 3

## 2015-11-08 MED ORDER — SODIUM CHLORIDE 0.9 % IV SOLN
INTRAVENOUS | Status: DC
  Administered 2015-11-08: 100 mL/h via INTRAVENOUS
  Administered 2015-11-08: 23:00:00 via INTRAVENOUS

## 2015-11-08 MED ORDER — GLYCOPYRROLATE 0.2 MG/ML IJ SOLN
INTRAMUSCULAR | Status: AC
  Filled 2015-11-08: qty 3

## 2015-11-08 MED ORDER — 0.9 % SODIUM CHLORIDE (POUR BTL) OPTIME
TOPICAL | Status: DC | PRN
  Administered 2015-11-08: 1000 mL

## 2015-11-08 MED ORDER — ONDANSETRON HCL 4 MG/2ML IJ SOLN
4.0000 mg | Freq: Four times a day (QID) | INTRAMUSCULAR | Status: DC | PRN
  Administered 2015-11-09 – 2015-11-13 (×6): 4 mg via INTRAVENOUS
  Filled 2015-11-08 (×6): qty 2

## 2015-11-08 MED ORDER — ARTIFICIAL TEARS OP OINT
TOPICAL_OINTMENT | OPHTHALMIC | Status: AC
  Filled 2015-11-08: qty 3.5

## 2015-11-08 MED ORDER — PROPOFOL 10 MG/ML IV BOLUS
INTRAVENOUS | Status: DC | PRN
  Administered 2015-11-08: 110 mg via INTRAVENOUS

## 2015-11-08 MED ORDER — LIDOCAINE HCL (CARDIAC) 20 MG/ML IV SOLN
INTRAVENOUS | Status: DC | PRN
  Administered 2015-11-08: 60 mg via INTRAVENOUS

## 2015-11-08 MED ORDER — ZOLPIDEM TARTRATE 5 MG PO TABS
5.0000 mg | ORAL_TABLET | Freq: Every evening | ORAL | Status: DC | PRN
  Administered 2015-11-10 – 2015-11-12 (×3): 5 mg via ORAL
  Filled 2015-11-08 (×3): qty 1

## 2015-11-08 MED ORDER — ROCURONIUM BROMIDE 50 MG/5ML IV SOLN
INTRAVENOUS | Status: AC
  Filled 2015-11-08: qty 1

## 2015-11-08 MED ORDER — NEOSTIGMINE METHYLSULFATE 10 MG/10ML IV SOLN
INTRAVENOUS | Status: AC
Start: 2015-11-08 — End: 2015-11-08
  Filled 2015-11-08: qty 1

## 2015-11-08 MED ORDER — CETYLPYRIDINIUM CHLORIDE 0.05 % MT LIQD
7.0000 mL | Freq: Two times a day (BID) | OROMUCOSAL | Status: DC
  Administered 2015-11-08 – 2015-11-09 (×2): 7 mL via OROMUCOSAL

## 2015-11-08 MED ORDER — FENTANYL CITRATE (PF) 100 MCG/2ML IJ SOLN
INTRAMUSCULAR | Status: DC | PRN
  Administered 2015-11-08 (×2): 50 ug via INTRAVENOUS
  Administered 2015-11-08: 150 ug via INTRAVENOUS

## 2015-11-08 MED ORDER — ONDANSETRON HCL 4 MG/2ML IJ SOLN
INTRAMUSCULAR | Status: AC
  Filled 2015-11-08: qty 2

## 2015-11-08 MED ORDER — ACETAMINOPHEN 10 MG/ML IV SOLN
INTRAVENOUS | Status: AC
  Administered 2015-11-08: 1000 mg via INTRAVENOUS
  Filled 2015-11-08: qty 100

## 2015-11-08 MED ORDER — LACTATED RINGERS IV SOLN
INTRAVENOUS | Status: DC
  Administered 2015-11-08 (×2): via INTRAVENOUS

## 2015-11-08 MED ORDER — ARTIFICIAL TEARS OP OINT
TOPICAL_OINTMENT | OPHTHALMIC | Status: DC | PRN
  Administered 2015-11-08: 1 via OPHTHALMIC

## 2015-11-08 MED ORDER — ROPIVACAINE HCL 2 MG/ML IJ SOLN
10.0000 mL/h | INTRAMUSCULAR | Status: DC
  Administered 2015-11-09 – 2015-11-10 (×2): 10 mL/h via EPIDURAL
  Filled 2015-11-08 (×5): qty 200

## 2015-11-08 MED ORDER — MIDAZOLAM HCL 2 MG/2ML IJ SOLN
INTRAMUSCULAR | Status: AC
  Filled 2015-11-08: qty 2

## 2015-11-08 MED ORDER — SUGAMMADEX SODIUM 200 MG/2ML IV SOLN
INTRAVENOUS | Status: DC | PRN
  Administered 2015-11-08: 150 mg via INTRAVENOUS

## 2015-11-08 MED ORDER — LITHIUM CARBONATE ER 450 MG PO TBCR
450.0000 mg | EXTENDED_RELEASE_TABLET | Freq: Two times a day (BID) | ORAL | Status: DC
  Administered 2015-11-08 – 2015-11-14 (×12): 450 mg via ORAL
  Filled 2015-11-08 (×15): qty 1

## 2015-11-08 MED ORDER — HYDROMORPHONE HCL 1 MG/ML IJ SOLN
INTRAMUSCULAR | Status: AC
  Administered 2015-11-08: 0.5 mg via INTRAVENOUS
  Filled 2015-11-08: qty 1

## 2015-11-08 MED ORDER — ROCURONIUM BROMIDE 100 MG/10ML IV SOLN
INTRAVENOUS | Status: DC | PRN
  Administered 2015-11-08: 50 mg via INTRAVENOUS
  Administered 2015-11-08: 20 mg via INTRAVENOUS

## 2015-11-08 MED ORDER — PROMETHAZINE HCL 25 MG/ML IJ SOLN
6.2500 mg | INTRAMUSCULAR | Status: AC | PRN
  Administered 2015-11-08 (×2): 6.25 mg via INTRAVENOUS

## 2015-11-08 MED ORDER — ONDANSETRON 4 MG PO TBDP
4.0000 mg | ORAL_TABLET | Freq: Four times a day (QID) | ORAL | Status: DC | PRN
  Filled 2015-11-08: qty 1

## 2015-11-08 MED ORDER — INFLUENZA VAC SPLIT QUAD 0.5 ML IM SUSY
0.5000 mL | PREFILLED_SYRINGE | INTRAMUSCULAR | Status: AC
  Administered 2015-11-09: 0.5 mL via INTRAMUSCULAR
  Filled 2015-11-08: qty 0.5

## 2015-11-08 MED ORDER — PANTOPRAZOLE SODIUM 40 MG PO TBEC
40.0000 mg | DELAYED_RELEASE_TABLET | Freq: Every day | ORAL | Status: DC
  Administered 2015-11-09 – 2015-11-14 (×6): 40 mg via ORAL
  Filled 2015-11-08 (×6): qty 1

## 2015-11-08 MED ORDER — HYDROMORPHONE HCL 1 MG/ML IJ SOLN
1.0000 mg | INTRAMUSCULAR | Status: DC | PRN
  Administered 2015-11-08 – 2015-11-09 (×5): 1 mg via INTRAVENOUS
  Filled 2015-11-08 (×5): qty 1

## 2015-11-08 MED ORDER — FENTANYL CITRATE (PF) 100 MCG/2ML IJ SOLN
INTRAMUSCULAR | Status: AC
  Administered 2015-11-08: 50 ug via INTRAVENOUS
  Filled 2015-11-08: qty 2

## 2015-11-08 MED ORDER — FENTANYL CITRATE (PF) 250 MCG/5ML IJ SOLN
INTRAMUSCULAR | Status: AC
  Filled 2015-11-08: qty 5

## 2015-11-08 MED ORDER — MIDAZOLAM HCL 2 MG/2ML IJ SOLN
INTRAMUSCULAR | Status: AC
  Administered 2015-11-08: 1 mg via INTRAVENOUS
  Filled 2015-11-08: qty 2

## 2015-11-08 MED ORDER — LIDOCAINE HCL (CARDIAC) 20 MG/ML IV SOLN
INTRAVENOUS | Status: AC
  Filled 2015-11-08: qty 5

## 2015-11-08 MED ORDER — SUGAMMADEX SODIUM 200 MG/2ML IV SOLN
INTRAVENOUS | Status: AC
  Filled 2015-11-08: qty 2

## 2015-11-08 MED ORDER — SIMETHICONE 80 MG PO CHEW: 40.0000 mg | CHEWABLE_TABLET | Freq: Four times a day (QID) | ORAL | Status: DC | PRN

## 2015-11-08 MED ORDER — PROPOFOL 10 MG/ML IV BOLUS
INTRAVENOUS | Status: AC
  Filled 2015-11-08: qty 20

## 2015-11-08 MED ORDER — HYDROMORPHONE HCL 1 MG/ML IJ SOLN
INTRAMUSCULAR | Status: AC
  Administered 2015-11-08: 0.25 mg via INTRAVENOUS
  Filled 2015-11-08: qty 1

## 2015-11-08 MED ORDER — PROMETHAZINE HCL 25 MG/ML IJ SOLN
INTRAMUSCULAR | Status: AC
  Administered 2015-11-08: 6.25 mg via INTRAVENOUS
  Filled 2015-11-08: qty 1

## 2015-11-08 MED ORDER — ONDANSETRON HCL 4 MG/2ML IJ SOLN
INTRAMUSCULAR | Status: DC | PRN
  Administered 2015-11-08: 4 mg via INTRAVENOUS

## 2015-11-08 MED ORDER — HYDROMORPHONE HCL 1 MG/ML IJ SOLN
0.2500 mg | INTRAMUSCULAR | Status: DC | PRN
  Administered 2015-11-08: 0.5 mg via INTRAVENOUS
  Administered 2015-11-08 (×2): 0.25 mg via INTRAVENOUS
  Administered 2015-11-08 (×2): 0.5 mg via INTRAVENOUS

## 2015-11-08 SURGICAL SUPPLY — 58 items
APPLIER CLIP 9.375 MED OPEN (MISCELLANEOUS) ×3
APR CLP MED 9.3 20 MLT OPN (MISCELLANEOUS) ×1
BINDER ABDOMINAL 10 UNV 27-48 (MISCELLANEOUS) ×4 IMPLANT
BIOPATCH RED 1 DISK 7.0 (GAUZE/BANDAGES/DRESSINGS) ×2 IMPLANT
BIOPATCH RED 1IN DISK 7.0MM (GAUZE/BANDAGES/DRESSINGS) ×2
BLADE SURG 11 STRL SS (BLADE) ×2 IMPLANT
BLADE SURG ROTATE 9660 (MISCELLANEOUS) IMPLANT
CANISTER SUCTION 2500CC (MISCELLANEOUS) ×3 IMPLANT
CHLORAPREP W/TINT 26ML (MISCELLANEOUS) ×3 IMPLANT
CLIP APPLIE 9.375 MED OPEN (MISCELLANEOUS) IMPLANT
CLOSURE STERI-STRIP 1/2X4 (GAUZE/BANDAGES/DRESSINGS) ×1
CLSR STERI-STRIP ANTIMIC 1/2X4 (GAUZE/BANDAGES/DRESSINGS) ×1 IMPLANT
COVER SURGICAL LIGHT HANDLE (MISCELLANEOUS) ×3 IMPLANT
DEVICE TROCAR PUNCTURE CLOSURE (ENDOMECHANICALS) ×2 IMPLANT
DRAIN CHANNEL 19F RND (DRAIN) ×4 IMPLANT
DRAPE LAPAROSCOPIC ABDOMINAL (DRAPES) ×3 IMPLANT
DRSG OPSITE POSTOP 4X10 (GAUZE/BANDAGES/DRESSINGS) ×2 IMPLANT
DRSG OPSITE POSTOP 4X8 (GAUZE/BANDAGES/DRESSINGS) ×2 IMPLANT
DRSG TEGADERM 2-3/8X2-3/4 SM (GAUZE/BANDAGES/DRESSINGS) ×4 IMPLANT
ELECT CAUTERY BLADE 6.4 (BLADE) ×3 IMPLANT
ELECT REM PT RETURN 9FT ADLT (ELECTROSURGICAL) ×3
ELECTRODE REM PT RTRN 9FT ADLT (ELECTROSURGICAL) ×1 IMPLANT
EVACUATOR SILICONE 100CC (DRAIN) ×4 IMPLANT
GAUZE SPONGE 4X4 12PLY STRL (GAUZE/BANDAGES/DRESSINGS) ×2 IMPLANT
GLOVE BIO SURGEON STRL SZ7 (GLOVE) ×5 IMPLANT
GLOVE BIO SURGEON STRL SZ7.5 (GLOVE) ×2 IMPLANT
GLOVE BIOGEL PI IND STRL 7.5 (GLOVE) ×1 IMPLANT
GLOVE BIOGEL PI IND STRL 8 (GLOVE) IMPLANT
GLOVE BIOGEL PI INDICATOR 7.5 (GLOVE) ×2
GLOVE BIOGEL PI INDICATOR 8 (GLOVE) ×2
GLOVE SURG SS PI 7.0 STRL IVOR (GLOVE) ×2 IMPLANT
GOWN STRL REUS W/ TWL LRG LVL3 (GOWN DISPOSABLE) ×3 IMPLANT
GOWN STRL REUS W/TWL LRG LVL3 (GOWN DISPOSABLE) ×9
KIT BASIN OR (CUSTOM PROCEDURE TRAY) ×3 IMPLANT
KIT ROOM TURNOVER OR (KITS) ×3 IMPLANT
LIQUID BAND (GAUZE/BANDAGES/DRESSINGS) ×2 IMPLANT
MESH VENTRALIGHT ST 12X14IN (Mesh General) ×2 IMPLANT
NS IRRIG 1000ML POUR BTL (IV SOLUTION) ×3 IMPLANT
PACK GENERAL/GYN (CUSTOM PROCEDURE TRAY) ×3 IMPLANT
PAD ARMBOARD 7.5X6 YLW CONV (MISCELLANEOUS) ×3 IMPLANT
PENCIL BUTTON HOLSTER BLD 10FT (ELECTRODE) ×2 IMPLANT
STAPLER VISISTAT 35W (STAPLE) ×2 IMPLANT
SUT ETHILON 2 0 FS 18 (SUTURE) ×4 IMPLANT
SUT NOV 1 T60/GS (SUTURE) IMPLANT
SUT NOVA NAB GS-21 0 18 T12 DT (SUTURE) IMPLANT
SUT PDS AB 2-0 CT1 27 (SUTURE) ×8 IMPLANT
SUT PROLENE 0 CT 1 CR/8 (SUTURE) ×4 IMPLANT
SUT SILK 2 0 (SUTURE) ×3
SUT SILK 2 0 SH (SUTURE) ×2 IMPLANT
SUT SILK 2 0 SH CR/8 (SUTURE) ×2 IMPLANT
SUT SILK 2-0 18XBRD TIE 12 (SUTURE) IMPLANT
SUT VIC AB 2-0 SH 18 (SUTURE) ×4 IMPLANT
SUT VIC AB 2-0 SH 27 (SUTURE) ×9
SUT VIC AB 2-0 SH 27X BRD (SUTURE) IMPLANT
SUT VIC AB 3-0 SH 18 (SUTURE) ×6 IMPLANT
TOWEL OR 17X24 6PK STRL BLUE (TOWEL DISPOSABLE) ×3 IMPLANT
TOWEL OR 17X26 10 PK STRL BLUE (TOWEL DISPOSABLE) ×3 IMPLANT
TRAY FOLEY CATH 14FRSI W/METER (CATHETERS) IMPLANT

## 2015-11-08 NOTE — Anesthesia Preprocedure Evaluation (Addendum)
Anesthesia Evaluation  Patient identified by MRN, date of birth, ID band Patient awake    Reviewed: Allergy & Precautions, H&P , NPO status , Patient's Chart, lab work & pertinent test results  Airway Mallampati: II  TM Distance: >3 FB Neck ROM: Full    Dental no notable dental hx. (+) Teeth Intact, Dental Advisory Given   Pulmonary neg pulmonary ROS, former smoker,    Pulmonary exam normal breath sounds clear to auscultation       Cardiovascular hypertension, + CAD, + Past MI, + Cardiac Stents and + Peripheral Vascular Disease   Rhythm:Regular Rate:Normal     Neuro/Psych Seizures -, Well Controlled,  Anxiety Depression Bipolar Disorder Schizophrenia    GI/Hepatic Neg liver ROS, GERD  Medicated and Controlled,  Endo/Other  negative endocrine ROS  Renal/GU negative Renal ROS  negative genitourinary   Musculoskeletal   Abdominal   Peds  Hematology negative hematology ROS (+)   Anesthesia Other Findings   Reproductive/Obstetrics negative OB ROS                            Anesthesia Physical Anesthesia Plan  ASA: III  Anesthesia Plan: General and Epidural   Post-op Pain Management: GA combined w/ Regional for post-op pain   Induction: Intravenous  Airway Management Planned: Oral ETT  Additional Equipment:   Intra-op Plan:   Post-operative Plan: Extubation in OR  Informed Consent: I have reviewed the patients History and Physical, chart, labs and discussed the procedure including the risks, benefits and alternatives for the proposed anesthesia with the patient or authorized representative who has indicated his/her understanding and acceptance.   Dental advisory given  Plan Discussed with: CRNA  Anesthesia Plan Comments:         Anesthesia Quick Evaluation

## 2015-11-08 NOTE — Progress Notes (Signed)
UR COMPLETED  

## 2015-11-08 NOTE — Op Note (Signed)
Preoperative diagnosis: incisional hernia Postoperative diagnosis: same as above Procedure: open incisional hernia repair with ventralight st mesh, bilateral myofascial (TAR) releases Surgeon Dr Serita Grammes Asst: Dr Ralene Ok EBL: 50 cc Drains 2 19 fr blake drains above mesh Specimens none Anesthesia general with epidural Complications none Sponge and needle count correct dispo to recovery stable  Indications: This is a 54 year old male was undergone a prior aortobifemoral bypass who has a increasingly symptomatic enlarging ventral hernia. This is mostly centered in his epigastrium. We discussed all the different types of repair and elected to do an open repair with likely myofascial releases.  Procedure: After informed consent was obtained the patient first underwent epidural placement with anesthesia. He was given cefazolin. Sequential compression devices were on his legs. He was placed under general anesthesia without complication. A Foley catheter and orogastric tube were placed. He was then prepped and draped in the standard sterile surgical fashion. A surgical timeout was then performed.  I excised his old scar. I then entered into his peritoneum without difficulty. Most of his adhesions were from his omentum to his anterior abdominal wall but these were taken down very easily. Once I had freed this up it did appear that he was going to do to be released and needed large piece of mesh for repair. The hernia was up to his xiphoid and was about 11 cm wide. I then incised the left posterior rectus sheath and dissected the peritoneum away from until I was able to see the transversus muscle. I then incised this about a cm from its attachments. These were this was then divided from its most superior portion down inferiorly. This space was then entered and taken out to the ASIS and space of Retzius. Superiorly this was taken as far as it would go as well. I then performed a similar  myofascial transversus abdominis release on the other side. I inspected all of the bowel and all of this was without injury. Hemostasis was then obtained. I then closed the peritoneum with 2-0 Vicryl suture. The counts were correct at this point. I then used a large rectangle mesh measuring over 35 cm and placed this in this space. This fit very well without any folds. It completely covers the defect and went up underneath the ribs to give good superior coverage of the hernia. I then placed 5 0 Prolene sutures superiorly to attach the middle and both corners. I had an additional lateral suture on both sides as well as an inferior suture. These were all placed with the use of the Endo Close device. The mesh was in good position and gave good coverage to my defect. Hemostasis was again observed. I then placed 2 19 [place] Blake drains over the mesh and secured these with 2-0 nylon suture. I then released the skin on both sides like to visualize his fascia. I closed his fascia with 2-0 PDS. I then closed the skin with staples. He tolerated this well was extubated and transferred to the recovery room in stable condition.

## 2015-11-08 NOTE — H&P (Signed)
[name] is an 54 y.o. male.   Chief Complaint: incisional hernia HPI:  55 yom who underwent afb in December 2015 with an incisional (epigastric mostly) hernia. I saw him initially in June and he now has not been smoking for over a month. He has been off patch for about 3 weeks. The hernia is slowly increasing in size and is bothering him. preventing him from doing some activities. no change in bms. he has been evaluated by cardiology already. I had him scheduled and his creatinine on preop labs and dos was elevated. he has seen his pcp and is now off lisinopril with nl creatinine. he returns today with hernia bothering him more now and it is clearly protruding more just seeing him in exam room  Past Medical History  Diagnosis Date  . Hyperlipidemia   . Myocardial infarction ([place]) 1996; 1997; 2000's    "total of 3" (10/22/2012)  . Anxiety   . Depression   . Schizophrenia ([place])   . PAD (peripheral artery disease) ([place])     2010 PTA & stent to left CIA & left SFA; 05/2012 PTA & stent to distal LCIA; 10/2012 thrombectomy and stents to [place]  . History of ETOH abuse     quit 2007  . Tobacco abuse   . Broken fingers   . Coronary artery disease     s/p PCI to RCA at [facility]; s/p PCI to RCA '08; Cath 05/2012 showed known occluded LCx, RCA stent w/ mild restenosis, LAD no significant dz, EF 35% . Cardiac cath in July of 2014 showed no significant change.   Marland Kitchen GERD (gastroesophageal reflux disease)     takes Protonix daily  . Bipolar affective ([place])     takes Lithium daily  . Hypertension     "pt. denies - no longer taking blood pressure medications"  . Seizures ([place])     "when I get anxious" (10/22/2012)  . Hepatitis C     "diagnosed in the past 2 wk" (10/22/2012)  . Urinary frequency     Past Surgical History  Procedure Laterality Date  . Subdural hematoma evacuation via craniotomy    . Anterior cervical decomp/discectomy fusion  01/2011    plate & screw placement    . Iliac artery stent  10/22/2012    "2" (10/22/2012)  . Brain surgery    . Posterior fusion cervical spine  2012  . Liver biopsy  10/2012  . Cardiac catheterization      Stent - 12  . Coronary angioplasty with stent placement  03/2007    to RCA  . Iliac artery stent  06/2012    left  . Left heart catheterization with coronary angiogram N/A 05/29/2012    Procedure: LEFT HEART CATHETERIZATION WITH CORONARY ANGIOGRAM;  Surgeon: Wellington Hampshire, MD;  Location: [place] CATH LAB;  Service: Cardiovascular;  Laterality: N/A;  . Lower extremity angiogram N/A 05/29/2012    Procedure: LOWER EXTREMITY ANGIOGRAM;  Surgeon: Wellington Hampshire, MD;  Location: [place] CATH LAB;  Service: Cardiovascular;  Laterality: N/A;  . Percutaneous stent intervention Left 05/29/2012    Procedure: PERCUTANEOUS STENT INTERVENTION;  Surgeon: Wellington Hampshire, MD;  Location: [facility] CATH LAB;  Service: Cardiovascular;  Laterality: Left;  . Abdominal aortagram N/A 10/22/2012    Procedure: ABDOMINAL Maxcine Ham;  Surgeon: Wellington Hampshire, MD;  Location: [facility] CATH LAB;  Service: Cardiovascular;  Laterality: N/A;  . Abdominal aortagram N/A 11/11/2014    Procedure: ABDOMINAL AORTAGRAM;  Surgeon:  Wellington Hampshire, MD;  Location: [facility] CATH LAB;  Service: Cardiovascular;  Laterality: N/A;  . Aorta - bilateral femoral artery bypass graft N/A 11/20/2014    Procedure: AORTA BIFEMORAL BYPASS GRAFT;  Surgeon: Mal Misty, MD;  Location[facility] OR;  Service: Vascular;  Laterality: N/A;    Family History  Problem Relation Age of Onset  . Emphysema Father   . Diabetes Other   . Alcohol abuse Other   . Hypertension Other   . Hyperlipidemia Other   . Seizures Other   . COPD Mother    Social History:  reports that he quit smoking about 4 months ago. His smoking use included Cigarettes. He has a 40 pack-year smoking history. He has never used smokeless tobacco. He reports that he does not drink alcohol or use illicit drugs.  Allergies: No Known  Allergies  Medication History (Sonya Bynum, CMA; 10/08/2015 9:54 AM) Vyvanse (70MG  Capsule, Oral) Active. ALPRAZolam (2MG  Tablet, Oral) Active. Clopidogrel Bisulfate (75MG  Tablet, Oral) Active. Divalproex Sodium ER (500MG  Tablet ER 24HR, Oral) Active. Lisinopril (20MG  Tablet, Oral) Active. Lithium Carbonate ER (450MG  Tablet ER, Oral) Active. Simvastatin (10MG  Tablet, Oral) Active. Abilify (20MG  Tablet, Oral) Active. Medications Reconciled  No results found for this or any previous visit (from the past 48 hour(s)). No results found.  ROS Negative  There were no vitals taken for this visit. Physical Exam  Vitals  Weight: 162.2 lb Height: 67in Body Surface Area: 1.85 m Body Mass Index: 25.4 kg/m  Temp.: 51F(Temporal)  Pulse: 73 (Regular)  BP: 122/76 (Sitting, Left Arm, Standard)  Physical Exam  General Mental Status-Alert. Orientation-Oriented X3. Chest and Lung Exam Chest and lung exam reveals -on auscultation, normal breath sounds, no adventitious sounds and normal vocal resonance. Cardiovascular Cardiovascular examination reveals -normal heart sounds, regular rate and rhythm with no murmurs. Abdomen Note: midline incision with at least 10 cm supraumbilical hernia that is mildly tender and reducible for most part, soft nd  Assessment/Plan Assessment & Plan  Story: I think ideally conservative mgt for him would be best as he is high risk but this is significant and causing him a lot of trouble. now that cr is normal will proceed with incisional hernia repair with mesh and tar. we have reviewed this procedure again today. he has told me he still is no longer smoking and is off patches also. He has been seen by cardiology and is off plavix. t I think due to his age, size of hernia and location that a laparoscopic repair with mesh would not be ideal. I talked to him again today about a preperitoneal mesh repair of this hernia with possible tar  release to medialize abdominal wall. I explained this fully and the rationale behind it. we also discussed the length of hospital stay and recovery. he understands this will be weeks. we also discussed risks which include but are not limited to bleeding, infection, reoperation, recurrence, injury to bowel, open wound, mesh infection, mi, dvt, pe stroke death etc. will plan on proceeding in next few weeks.    [place] Record 11/08/2015, 7:20 AM

## 2015-11-08 NOTE — Care Management Note (Signed)
Case Management Note  Patient Details  Name: [name] MRN: [id number] Date of Birth: 1961-11-08  Subjective/Objective:                Admitted with incisional hernia, s/p  open incisional hernia repair with ventralight st mesh, bilateral myofascial (TAR) releases 11/08/15.   Action/Plan: Return to home when medically stable. CM to f/u with disposition needs.  Expected Discharge Date:                  Expected Discharge Plan:  Home/Self Care  In-House Referral:     Discharge planning Services  CM Consult  Post Acute Care Choice:    Choice offered to:     DME Arranged:    DME Agency:     HH Arranged:    HH Agency:     Status of Service:  In process, will continue to follow  Medicare Important Message Given:    Date Medicare IM Given:    Medicare IM give by:    Date Additional Medicare IM Given:    Additional Medicare Important Message give by:     If discussed at [place] of Stay Meetings, dates discussed:    Additional Comments:  Sharin Mons, [place] [phone] 11/08/2015, 9:24 PM

## 2015-11-08 NOTE — Anesthesia Procedure Notes (Addendum)
Central Venous Catheter Insertion Performed by: anesthesiologist 11/08/2015 10:09 AM Patient location: OR. Preanesthetic checklist: patient identified, IV checked, site marked, risks and benefits discussed, surgical consent, monitors and equipment checked, pre-op evaluation, timeout performed and anesthesia consent Position: Trendelenburg Landmarks identified Catheter size: 8 Fr Central line was placed.Double lumen Procedure performed using ultrasound guided technique. Attempts: 1 Following insertion, dressing applied, line sutured and Biopatch. Post procedure assessment: blood return through all ports. Patient tolerated the procedure well with no immediate complications.    Procedure Name: Intubation Date/Time: 11/08/2015 9:25 AM Performed by: Willeen Cass P Pre-anesthesia Checklist: Patient identified, Timeout performed, Emergency Drugs available, Suction available and Patient being monitored Patient Re-evaluated:Patient Re-evaluated prior to inductionOxygen Delivery Method: Circle system utilized Preoxygenation: Pre-oxygenation with 100% oxygen Intubation Type: IV induction Ventilation: Mask ventilation without difficulty Laryngoscope Size: Mac and 4 Grade View: Grade I Tube type: Oral Tube size: 7.5 mm Number of attempts: 1 Airway Equipment and Method: Stylet Placement Confirmation: ETT inserted through vocal cords under direct vision,  positive ETCO2 and breath sounds checked- equal and bilateral Secured at: 22 cm Tube secured with: Tape Dental Injury: Teeth and Oropharynx as per pre-operative assessment     Epidural Patient location during procedure: holding area Start time: 11/08/2015 8:45 AM End time: 11/08/2015 9:00 AM  Staffing Anesthesiologist: Roderic Palau  Preanesthetic Checklist Completed: patient identified, site marked, surgical consent, pre-op evaluation, timeout performed, IV checked, risks and benefits discussed, monitors and equipment checked  and post-op pain management  Epidural Patient position: sitting Prep: DuraPrep Patient monitoring: heart rate, cardiac monitor, continuous pulse ox and blood pressure Approach: right paramedian Location: thoracic (1-12) Injection technique: LOR air  Needle:  Needle type: Tuohy  Needle gauge: 18 G Needle length: 9 cm Needle insertion depth: 5 cm Catheter type: closed end flexible Catheter size: 20 Guage Catheter at skin depth: 10 cm Test dose: negative and 1.5% lidocaine with Epi 1:200 K  Assessment Sensory level: T6  Additional Notes Reason for block:post-op pain management

## 2015-11-08 NOTE — Interval H&P Note (Signed)
History and Physical Interval Note:  11/08/2015 8:49 AM  [name]  has presented today for surgery, with the diagnosis of INCISIONAL HERNIA  The various methods of treatment have been discussed with the patient and family. After consideration of risks, benefits and other options for treatment, the patient has consented to  Procedure(s): INCISIONAL HERNIA REPAIR WITH MESH POSSILE MYOFASCIAL RELEASE (N/A) as a surgical intervention .  The patient's history has been reviewed, patient examined, no change in status, stable for surgery.  I have reviewed the patient's chart and labs.  Questions were answered to the patient's satisfaction.     Virgil Slinger

## 2015-11-08 NOTE — Anesthesia Postprocedure Evaluation (Signed)
Anesthesia Post Note  Patient: [name]  Procedure(s) Performed: Procedure(s) (LRB): INCISIONAL HERNIA REPAIR WITH MYOFASCIAL RELEASE (N/A) INSERTION OF MESH (N/A)  Patient location during evaluation: PACU Anesthesia Type: General Level of consciousness: sedated Pain management: pain level controlled Vital Signs Assessment: post-procedure vital signs reviewed and stable Respiratory status: spontaneous breathing and respiratory function stable Cardiovascular status: stable Anesthetic complications: no    Last Vitals:  Filed Vitals:   11/08/15 1300 11/08/15 1345  BP: 172/85 134/97  Pulse: 60 68  Temp:    Resp: 10 12               Izen Petz DANIEL

## 2015-11-08 NOTE — Transfer of Care (Signed)
Immediate Anesthesia Transfer of Care Note  Patient: [name]  Procedure(s) Performed: Procedure(s): INCISIONAL HERNIA REPAIR WITH MYOFASCIAL RELEASE (N/A) INSERTION OF MESH (N/A)  Patient Location: PACU  Anesthesia Type:General  Level of Consciousness: awake, alert , oriented and patient cooperative  Airway & Oxygen Therapy: Patient Spontanous Breathing  Post-op Assessment: Report given to RN  Post vital signs: Reviewed and stable  Last Vitals:  BP 153/ 94 (109) HR 59 RR 17 SpO2 97% on RA Resting comfortably, maintains good airway. C/o pain-- epidural bolus given and gtt titrated up by Dr Therisa Doyne. See anes record for details.   Complications: No apparent anesthesia complications

## 2015-11-09 ENCOUNTER — Inpatient Hospital Stay (HOSPITAL_COMMUNITY): Payer: Medicaid Other

## 2015-11-09 ENCOUNTER — Encounter (HOSPITAL_COMMUNITY): Payer: Self-pay | Admitting: General Surgery

## 2015-11-09 LAB — CBC
HCT: 36.7 % — ABNORMAL LOW (ref 39.0–52.0)
Hemoglobin: 12.2 g/dL — ABNORMAL LOW (ref 13.0–17.0)
MCH: 31 pg (ref 26.0–34.0)
MCHC: 33.2 g/dL (ref 30.0–36.0)
MCV: 93.4 fL (ref 78.0–100.0)
Platelets: 123 10*3/uL — ABNORMAL LOW (ref 150–400)
RBC: 3.93 MIL/uL — ABNORMAL LOW (ref 4.22–5.81)
RDW: 12.7 % (ref 11.5–15.5)
WBC: 9.2 10*3/uL (ref 4.0–10.5)

## 2015-11-09 LAB — BASIC METABOLIC PANEL
Anion gap: 6 (ref 5–15)
BUN: 13 mg/dL (ref 6–20)
CO2: 27 mmol/L (ref 22–32)
Calcium: 8.4 mg/dL — ABNORMAL LOW (ref 8.9–10.3)
Chloride: 107 mmol/L (ref 101–111)
Creatinine, Ser: 1.11 mg/dL (ref 0.61–1.24)
GFR calc Af Amer: >60 mL/min (ref >60)
GFR calc non Af Amer: >60 mL/min (ref >60)
Glucose, Bld: 115 mg/dL — ABNORMAL HIGH (ref 65–99)
Potassium: 4.4 mmol/L (ref 3.5–5.1)
Sodium: 140 mmol/L (ref 135–145)

## 2015-11-09 MED ORDER — HEPARIN SODIUM (PORCINE) 5000 UNIT/ML IJ SOLN
5000.0000 [IU] | Freq: Three times a day (TID) | INTRAMUSCULAR | Status: DC
  Administered 2015-11-09 – 2015-11-11 (×4): 5000 [IU] via SUBCUTANEOUS
  Filled 2015-11-09 (×3): qty 1

## 2015-11-09 MED ORDER — MORPHINE SULFATE (PF) 2 MG/ML IV SOLN
2.0000 mg | INTRAVENOUS | Status: DC | PRN
  Administered 2015-11-09 – 2015-11-14 (×26): 2 mg via INTRAVENOUS
  Filled 2015-11-09 (×26): qty 1

## 2015-11-09 MED ORDER — DEXTROSE-NACL 5-0.45 % IV SOLN
INTRAVENOUS | Status: DC
  Administered 2015-11-09 – 2015-11-12 (×6): via INTRAVENOUS

## 2015-11-09 NOTE — Progress Notes (Signed)
1 Day Post-Op  Subjective: Some pain otherwise doing well, no n/v  Objective: Vital signs in last 24 hours: Temp:  [97.5 F (36.4 C)-98.4 F (36.9 C)] 98.3 F (36.8 C) (12/06 0351) Pulse Rate:  [54-84] 84 (12/06 0351) Resp:  [7-24] 24 (12/06 0351) BP: (108-172)/(46-99) 116/89 mmHg (12/06 0351) SpO2:  [94 %-100 %] 96 % (12/06 0351) Weight:  [72.576 kg (160 lb)] 72.576 kg (160 lb) (12/05 2000)    Intake/Output from previous day: 12/05 0701 - 12/06 0700 In: 2911.8 [I.V.:2551.7; IV Piggyback:200] Out: [id number] [Urine:795; Drains:445; Blood:100] Intake/Output this shift: Total I/O In: 1811.8 [I.V.:1551.7; Other:160.1; IV Piggyback:100] Out: 490 [Urine:350; Drains:140]  Resp: diminished breath sounds bibasilar Cardio: regular rate and rhythm GI: incision clean, drains serosang, approp tender  Lab Results:   Recent Labs  11/09/15 0445  WBC 9.2  HGB 12.2*  HCT 36.7*  PLT 123*   BMET  Recent Labs  11/09/15 0445  NA 140  K 4.4  CL 107  CO2 27  GLUCOSE 115*  BUN 13  CREATININE 1.11  CALCIUM 8.4*   PT/INR No results for input(s): LABPROT, INR in the last 72 hours. ABG No results for input(s): PHART, HCO3 in the last 72 hours.  Invalid input(s): PCO2, PO2  Studies/Results: No results found.  Anti-infectives: Anti-infectives    Start     Dose/Rate Route Frequency Ordered Stop   11/08/15 0915  ceFAZolin (ANCEF) IVPB 2 g/50 mL premix     2 g 100 mL/hr over 30 Minutes Intravenous To [facility] 11/07/15 1511 11/08/15 0929      Assessment/Plan: POD 1 incisional hernia repair ,tar  1. Cont epidural, iv backup, mm relaxant as needed 2. Aggressive pulm toilet, needs oob today, ics 3. Check cxr with line (thought was being done yesterday) 4. Clear liquids 5. Recheck hct in am 6 sq heparin, scds 7. Cont foley for monitoring, will dc in am tomorrow 8. Plan to floor in am if doing well  [facility] 11/09/2015

## 2015-11-10 LAB — CBC
HCT: 32 % — ABNORMAL LOW (ref 39.0–52.0)
Hemoglobin: 11 g/dL — ABNORMAL LOW (ref 13.0–17.0)
MCH: 31.6 pg (ref 26.0–34.0)
MCHC: 34.4 g/dL (ref 30.0–36.0)
MCV: 92 fL (ref 78.0–100.0)
Platelets: 121 10*3/uL — ABNORMAL LOW (ref 150–400)
RBC: 3.48 MIL/uL — ABNORMAL LOW (ref 4.22–5.81)
RDW: 12.7 % (ref 11.5–15.5)
WBC: 7.9 10*3/uL (ref 4.0–10.5)

## 2015-11-10 LAB — MRSA PCR SCREENING: MRSA by PCR: NEGATIVE

## 2015-11-10 MED ORDER — KETOROLAC TROMETHAMINE 15 MG/ML IJ SOLN
15.0000 mg | Freq: Three times a day (TID) | INTRAMUSCULAR | Status: DC | PRN
  Administered 2015-11-10 – 2015-11-12 (×3): 15 mg via INTRAVENOUS
  Filled 2015-11-10 (×3): qty 1

## 2015-11-10 NOTE — Progress Notes (Signed)
Called to room after nursing noticed edema around the soft tissue area to the lower right of his back. I was unable to appreciate asymmetric edema but the catheter now appeared to be at <7cm in the skin, which would correlate with less than 2cm in the epidural space and likely a catheter that had migrated out of the space. Since he had been off heparin for an appropriate amount of time we agreed to remove the catheter at this time since he was likely getting no benefit from it. Tip was intact on removal, no signs of erythema or streaking at epidural site, no point tenderness, moving both legs and sitting in his chair. Heparin may be restarted SQ within the next 2 hours, but the most conservative option would be to wait 2 hours until next dose to decrease risk of epidural hematoma following catheter manipulation. I have alerted the patient and his care staff to be alert for red flags of epidural abscess or epidural hematoma: significant back pain, fever, WBC elevation, bilateral loss of LE motor function, etc.   Veatrice Kells, MD

## 2015-11-10 NOTE — Progress Notes (Signed)
2 Days Post-Op  Subjective: Pain this am, no flatus/bm, did not get up yesterday   Objective: Vital signs in last 24 hours: Temp:  [98.7 F (37.1 C)-99.7 F (37.6 C)] 98.7 F (37.1 C) (12/07 0745) Pulse Rate:  [82-97] 82 (12/07 0453) Resp:  [20-26] 25 (12/07 0453) BP: (104-121)/(72-82) 104/82 mmHg (12/07 0453) SpO2:  [89 %-93 %] 93 % (12/07 0453)    Intake/Output from previous day: 12/06 0701 - 12/07 0700 In: 2480 [P.O.:120; I.V.:2120] Out: 1535 [Urine:1225; Drains:310] Intake/Output this shift: Total I/O In: -  Out: 212.5 [Urine:200; Drains:12.5]  General appearance: no distress Resp: clear to auscultation bilaterally Cardio: regular rate and rhythm GI: wound clean, drains more serous, few bs mild distended  Lab Results:   Recent Labs  11/09/15 0445 11/10/15 0057  WBC 9.2 7.9  HGB 12.2* 11.0*  HCT 36.7* 32.0*  PLT 123* 121*   BMET  Recent Labs  11/09/15 0445  NA 140  K 4.4  CL 107  CO2 27  GLUCOSE 115*  BUN 13  CREATININE 1.11  CALCIUM 8.4*   PT/INR No results for input(s): LABPROT, INR in the last 72 hours. ABG No results for input(s): PHART, HCO3 in the last 72 hours.  Invalid input(s): PCO2, PO2  Studies/Results: Dg Chest Port 1 View  11/09/2015  CLINICAL DATA:  Shortness of Breath EXAM: PORTABLE CHEST 1 VIEW COMPARISON:  November 29, 2014 FINDINGS: Endotracheal tube and nasogastric tube have been removed. Central catheter tip is in the superior vena cava. There is no demonstrable pneumothorax. There is atelectatic change in the left base with mild elevation of the left hemidiaphragm, stable. Lungs elsewhere are clear. Heart is upper normal in size with pulmonary vascularity within normal limits. No adenopathy. There is calcification in the left carotid artery. There is postoperative change in the lower cervical spine. IMPRESSION: No demonstrable pneumothorax. Persistent elevation of the left hemidiaphragm with left base atelectasis. No change in  cardiac silhouette. Calcification noted in left carotid artery. Electronically Signed   By: Lowella Grip III M.D.   On: 11/09/2015 07:55    Anti-infectives: Anti-infectives    Start     Dose/Rate Route Frequency Ordered Stop   11/08/15 0915  ceFAZolin (ANCEF) IVPB 2 g/50 mL premix     2 g 100 mL/hr over 30 Minutes Intravenous To [facility] 11/07/15 1511 11/08/15 0929      Assessment/Plan: POD 2 incisional hernia repair ,tar  1. Cont epidural, iv backup, mm relaxant as needed, will add toradol today 2. Aggressive pulm toilet, needs oob today this did not happen yesterday, pt consult for home recs, ics 3. Sips/chips until more bowel function 4. Recheck hct in am 5 sq heparin, scds 6. Dc foley 7. Transfer to floor  [facility] 11/10/2015

## 2015-11-10 NOTE — Progress Notes (Signed)
S: reports still has abdominal pain that [name] describes as cramping abdominal pain, does report mild back pain, overall [name] says pain is better than yesterday, still has not been up and out of bed  O: lying in bed, appears in minimal discomfort, answers questions appropriately, can move legs bilaterally and denies point tenderness on back exam -dressing is intact, no signs of erythema or streaking, catheter at 8cm which means is 3cm in the epidural space and is acceptable, difficult to test level with ICE given abdominal binder in place Afebrile, BP 104/82, HR 80s, minimal O2 with nasal cannula  A/P: would rec continuing epidural for now, appears more comfortable than yesterday and with less IV narcotic used so far this am (no dilaudid IV), no signs of fever, no increased WBC -[name] does need to get up to chair and out of bed which should happen today, continue incentive spirometer use, foley has been removed and [name] expresses need to urinate which is good - HEPARIN: will need to be held for 6 hours prior to pulling epidural per our guidelines

## 2015-11-10 NOTE — Progress Notes (Signed)
Report called to [place] on 6N.

## 2015-11-11 MED ORDER — ENOXAPARIN SODIUM 40 MG/0.4ML ~~LOC~~ SOLN
40.0000 mg | SUBCUTANEOUS | Status: DC
  Administered 2015-11-11 – 2015-11-13 (×3): 40 mg via SUBCUTANEOUS
  Filled 2015-11-11 (×3): qty 0.4

## 2015-11-11 MED ORDER — OXYCODONE HCL 5 MG PO TABS
10.0000 mg | ORAL_TABLET | ORAL | Status: DC | PRN
  Administered 2015-11-12 – 2015-11-14 (×4): 10 mg via ORAL
  Filled 2015-11-11 (×4): qty 2

## 2015-11-11 MED ORDER — BISACODYL 10 MG RE SUPP
10.0000 mg | Freq: Once | RECTAL | Status: AC
  Administered 2015-11-11: 10 mg via RECTAL
  Filled 2015-11-11: qty 1

## 2015-11-11 NOTE — Progress Notes (Signed)
3 Days Post-Op  Subjective: Epidural out, no flatus/bm, on floor, was out of bed, epidural out, voiding fine  Objective: Vital signs in last 24 hours: Temp:  [98.2 F (36.8 C)-99.1 F (37.3 C)] 99 F (37.2 C) (12/08 0446) Pulse Rate:  [77-93] 78 (12/08 0446) Resp:  [16-21] 21 (12/08 0446) BP: (99-121)/(68-76) 114/68 mmHg (12/08 0446) SpO2:  [92 %-96 %] 94 % (12/08 0446) Last BM Date: 11/08/15  Intake/Output from previous day: 12/07 0701 - 12/08 0700 In: 1400 [I.V.:1400] Out: 1867.5 [Urine:1725; Drains:142.5] Intake/Output this shift: Total I/O In: -  Out: 1030 [Urine:900; Drains:130]  General appearance: no distress Resp: clear to auscultation bilaterally Cardio: regular rate and rhythm GI: incision clean, dressing removed,drains serous, few bs, approp tender  Lab Results:   Recent Labs  11/09/15 0445 11/10/15 0057  WBC 9.2 7.9  HGB 12.2* 11.0*  HCT 36.7* 32.0*  PLT 123* 121*   BMET  Recent Labs  11/09/15 0445  NA 140  K 4.4  CL 107  CO2 27  GLUCOSE 115*  BUN 13  CREATININE 1.11  CALCIUM 8.4*   PT/INR No results for input(s): LABPROT, INR in the last 72 hours. ABG No results for input(s): PHART, HCO3 in the last 72 hours.  Invalid input(s): PCO2, PO2  Studies/Results: Dg Chest Port 1 View  11/09/2015  CLINICAL DATA:  Shortness of Breath EXAM: PORTABLE CHEST 1 VIEW COMPARISON:  November 29, 2014 FINDINGS: Endotracheal tube and nasogastric tube have been removed. Central catheter tip is in the superior vena cava. There is no demonstrable pneumothorax. There is atelectatic change in the left base with mild elevation of the left hemidiaphragm, stable. Lungs elsewhere are clear. Heart is upper normal in size with pulmonary vascularity within normal limits. No adenopathy. There is calcification in the left carotid artery. There is postoperative change in the lower cervical spine. IMPRESSION: No demonstrable pneumothorax. Persistent elevation of the left  hemidiaphragm with left base atelectasis. No change in cardiac silhouette. Calcification noted in left carotid artery. Electronically Signed   By: Lowella Grip III M.D.   On: 11/09/2015 07:55    Anti-infectives: Anti-infectives    Start     Dose/Rate Route Frequency Ordered Stop   11/08/15 0915  ceFAZolin (ANCEF) IVPB 2 g/50 mL premix     2 g 100 mL/hr over 30 Minutes Intravenous To [facility] 11/07/15 1511 11/08/15 0929      Assessment/Plan: POD 3 incisional hernia repair ,tar  1. troradol prn, mm relaxants, start oxy with iv backup 2. PT to see today, aggressive pulm toilet, needs to be oob as much as possible 3. Clear liquids 4. Recheck hct in am, check bmet 5. Change to lovenox, scds 6. Continue home meds  [facility] 11/11/2015

## 2015-11-11 NOTE — Evaluation (Signed)
Physical Therapy Evaluation Patient Details Name: [name] MRN: [id number] DOB: [birth year] Today's Date: 11/11/2015   History of Present Illness  Patient is a 54 yo male admitted 11/08/15 with incisional hernia.  Now s/p open incisional hernia repair with ventralight st mesh, bilateral myofascial (TAR) releases.  PMH:  CAD, MI, PAD, cervical fusion, schizophrenia, bipolar affective disorder, anxiety, depression, HTN, seizures  Clinical Impression  Patient presents with problems listed below.  Will benefit from acute PT to maximize functional independence prior to discharge home.  Do not anticipate any f/u PT needs for d/c.    Follow Up Recommendations No PT follow up;Supervision for mobility/OOB    Equipment Recommendations  None recommended by PT    Recommendations for Other Services       Precautions / Restrictions Precautions Precautions: Fall Restrictions Weight Bearing Restrictions: No      Mobility  Bed Mobility Overal bed mobility: Modified Independent             General bed mobility comments: Use of bed rail  Transfers Overall transfer level: Needs assistance Equipment used: None Transfers: Sit to/from Stand Sit to Stand: Min guard         General transfer comment: Safety/balance  Ambulation/Gait Ambulation/Gait assistance: Min guard Ambulation Distance (Feet): 40 Feet Assistive device: 1 person hand held assist Gait Pattern/deviations: Step-through pattern;Decreased stride length Gait velocity: decreased Gait velocity interpretation: Below normal speed for age/gender General Gait Details: Patient with slow guarded gait.  Limited distance - patient wanted to stay in room. Had just been given suppository for BM.  Stairs            Wheelchair Mobility    Modified Rankin (Stroke Patients Only)       Balance Overall balance assessment: No apparent balance deficits (not formally assessed)                                            Pertinent Vitals/Pain Pain Assessment: 0-10 Pain Score: 2  Pain Location: Abdomen Pain Descriptors / Indicators: Sore Pain Intervention(s): Monitored during session    Home Living Family/patient expects to be discharged to:: Private residence Living Arrangements: Non-relatives/Friends Available Help at Discharge: Friend(s);Available 24 hours/day Type of Home: House Home Access: Stairs to enter Entrance Stairs-Rails: None Entrance Stairs-Number of Steps: 3 Home Layout: One level Home Equipment: None      Prior Function Level of Independence: Independent         Comments: Does not drive     Hand Dominance        Extremity/Trunk Assessment   Upper Extremity Assessment: Overall WFL for tasks assessed           Lower Extremity Assessment: Overall WFL for tasks assessed         Communication   Communication: No difficulties  Cognition Arousal/Alertness: Awake/alert Behavior During Therapy: Flat affect Overall Cognitive Status: Within Functional Limits for tasks assessed                      General Comments General comments (skin integrity, edema, etc.): Patient wearing abdominal binder    Exercises        Assessment/Plan    PT Assessment Patient needs continued PT services  PT Diagnosis Abnormality of gait;Acute pain   PT Problem List Decreased strength;Decreased activity tolerance;Decreased mobility;Pain  PT Treatment Interventions Gait training;Stair training;Functional mobility  training;Therapeutic exercise;Patient/family education   PT Goals (Current goals can be found in the Care Plan section) Acute Rehab PT Goals Patient Stated Goal: None stated PT Goal Formulation: With patient Time For Goal Achievement: 11/18/15 Potential to Achieve Goals: Good    Frequency Min 3X/week   Barriers to discharge        Co-evaluation               End of Session Equipment Utilized During Treatment: Other (comment)  (Abdominal binder) Activity Tolerance: Patient tolerated treatment well Patient left: in bed;with call bell/phone within reach           Time: 1013-1023 PT Time Calculation (min) (ACUTE ONLY): 10 min   Charges:   PT Evaluation $Initial PT Evaluation Tier I: 1 Procedure     PT G CodesDespina Pole 2015/12/01, 10:37 AM Carita Pian. Sanjuana Kava, [place] Pager [phone]

## 2015-11-12 LAB — BASIC METABOLIC PANEL
Anion gap: 6 (ref 5–15)
BUN: <5 mg/dL — ABNORMAL LOW (ref 6–20)
CO2: 26 mmol/L (ref 22–32)
Calcium: 8.5 mg/dL — ABNORMAL LOW (ref 8.9–10.3)
Chloride: 104 mmol/L (ref 101–111)
Creatinine, Ser: 1.02 mg/dL (ref 0.61–1.24)
GFR calc Af Amer: >60 mL/min (ref >60)
GFR calc non Af Amer: >60 mL/min (ref >60)
Glucose, Bld: 98 mg/dL (ref 65–99)
Potassium: 3 mmol/L — ABNORMAL LOW (ref 3.5–5.1)
Sodium: 136 mmol/L (ref 135–145)

## 2015-11-12 MED ORDER — SODIUM CHLORIDE 0.9 % IJ SOLN
10.0000 mL | INTRAMUSCULAR | Status: DC | PRN
  Administered 2015-11-12: 10 mL
  Administered 2015-11-13: 20 mL
  Filled 2015-11-12 (×2): qty 40

## 2015-11-12 MED ORDER — POTASSIUM CHLORIDE 10 MEQ/100ML IV SOLN
10.0000 meq | INTRAVENOUS | Status: AC
  Administered 2015-11-12 (×4): 10 meq via INTRAVENOUS
  Filled 2015-11-12 (×4): qty 100

## 2015-11-12 MED ORDER — POLYETHYLENE GLYCOL 3350 17 G PO PACK
17.0000 g | PACK | Freq: Every day | ORAL | Status: DC
  Administered 2015-11-12 – 2015-11-13 (×2): 17 g via ORAL
  Filled 2015-11-12 (×2): qty 1

## 2015-11-12 NOTE — Progress Notes (Signed)
Physical Therapy Treatment and Discharge Patient Details Name: [name] MRN: [id number] DOB: 01-28-1961 Today's Date: 11/12/2015    History of Present Illness Patient is a 54 yo male admitted 11/08/15 with incisional hernia.  Now s/p open incisional hernia repair with ventralight st mesh, bilateral myofascial (TAR) releases.  PMH:  CAD, MI, PAD, cervical fusion, schizophrenia, bipolar affective disorder, anxiety, depression, HTN, seizures    PT Comments    Patient mobilizing without physical assistance. Able to push his own IV pole safely as walking on the unit. No further PT needs. Will d/c from PT. Patient aware he needs to ask for nursing assist to help him ambulate until they clear him to walk alone.   Follow Up Recommendations  No PT follow up;Supervision for mobility/OOB     Equipment Recommendations  None recommended by PT    Recommendations for Other Services       Precautions / Restrictions Precautions Precautions: None Restrictions Weight Bearing Restrictions: No    Mobility  Bed Mobility Overal bed mobility: Modified Independent             General bed mobility comments: Use of bed rail, HOB flat; in/out of bed  Transfers Overall transfer level: Independent Equipment used: None Transfers: Sit to/from Stand Sit to Stand: Independent            Ambulation/Gait Ambulation/Gait assistance: Supervision;Modified independent (Device/Increase time) Ambulation Distance (Feet): 170 Feet Assistive device:  (pushing his IV pole) Gait Pattern/deviations: WFL(Within Functional Limits)   Gait velocity interpretation: at or above normal speed for age/gender     Stairs Stairs:  (pt deferred; won't be a problme)          Wheelchair Mobility    Modified Rankin (Stroke Patients Only)       Balance Overall balance assessment: No apparent balance deficits (not formally assessed)                                  Cognition  Arousal/Alertness: Awake/alert Behavior During Therapy: Anxious (stated visitors in his room make him anxious) Overall Cognitive Status: Within Functional Limits for tasks assessed                      Exercises      General Comments General comments (skin integrity, edema, etc.): abd binder and drains intact      Pertinent Vitals/Pain Pain Assessment: Faces Pain Score:  (unable to state) Faces Pain Scale: Hurts even more Pain Location: abd Pain Descriptors / Indicators: Operative site guarding Pain Intervention(s): Limited activity within patient's tolerance;Monitored during session;Premedicated before session;Repositioned;Patient requesting pain meds-RN notified    Home Living                      Prior Function            PT Goals (current goals can now be found in the care plan section) Acute Rehab PT Goals Patient Stated Goal: None stated PT Goal Formulation: With patient Time For Goal Achievement: 11/18/15 Potential to Achieve Goals: Good Progress towards PT goals: Goals met/education completed, patient discharged from PT    Frequency       PT Plan Current plan remains appropriate    Co-evaluation             End of Session Equipment Utilized During Treatment: Other (comment) (Abdominal binder) Activity Tolerance: Patient tolerated treatment well Patient  left: in bed;with call bell/phone within reach;with bed alarm set;with family/visitor present   Patient is being discharged from PT services secondary to:  Goals met and no further therapy needs identified.    Progress and discharge plan and discussed with patient/caregiver and they  Agree    Time: 1505-6979 PT Time Calculation (min) (ACUTE ONLY): 19 min  Charges:  $Gait Training: 8-22 mins                    G Codes:      Naod Sweetland 12/01/15, 4:13 PM Pager [phone]

## 2015-11-12 NOTE — Progress Notes (Signed)
4 Days Post-Op  Subjective: Having flatus, pain controlled, ambulating, voiding  Objective: Vital signs in last 24 hours: Temp:  [97.5 F (36.4 C)-98.8 F (37.1 C)] 97.5 F (36.4 C) (12/09 0532) Pulse Rate:  [66-88] 66 (12/09 0532) Resp:  [17-18] 17 (12/09 0532) BP: (115-123)/(72-75) 119/72 mmHg (12/09 0532) SpO2:  [92 %-95 %] 92 % (12/09 0532) Last BM Date: 11/07/15  Intake/Output from previous day: 12/08 0701 - 12/09 0700 In: 4253.6 [P.O.:1040; I.V.:2649.6; IV Piggyback:564] Out: 2400 [Urine:2200; Drains:200] Intake/Output this shift:    Resp: clear to auscultation bilaterally Cardio: regular rate and rhythm GI: soft bs present drains serous incision without infection  Lab Results:   Recent Labs  11/10/15 0057  WBC 7.9  HGB 11.0*  HCT 32.0*  PLT 121*    Anti-infectives: Anti-infectives    Start     Dose/Rate Route Frequency Ordered Stop   11/08/15 0915  ceFAZolin (ANCEF) IVPB 2 g/50 mL premix     2 g 100 mL/hr over 30 Minutes Intravenous To [facility] 11/07/15 1511 11/08/15 0929      Assessment/Plan: POD 4 incisional hernia repair ,tar  1. Oxy with iv backup, mm relaxant 2.  aggressive pulm toilet, needs to be oob as much as possible 3. Full liquids 4.  check bmet 5.  lovenox, scds 6. Continue home meds 7. Hopefully home over weekend  [facility] 11/12/2015

## 2015-11-13 LAB — BASIC METABOLIC PANEL
Anion gap: 6 (ref 5–15)
BUN: <5 mg/dL — ABNORMAL LOW (ref 6–20)
CO2: 26 mmol/L (ref 22–32)
Calcium: 8.4 mg/dL — ABNORMAL LOW (ref 8.9–10.3)
Chloride: 104 mmol/L (ref 101–111)
Creatinine, Ser: 0.92 mg/dL (ref 0.61–1.24)
GFR calc Af Amer: >60 mL/min (ref >60)
GFR calc non Af Amer: >60 mL/min (ref >60)
Glucose, Bld: 90 mg/dL (ref 65–99)
Potassium: 3.5 mmol/L (ref 3.5–5.1)
Sodium: 136 mmol/L (ref 135–145)

## 2015-11-13 MED ORDER — DOCUSATE SODIUM 100 MG PO CAPS
100.0000 mg | ORAL_CAPSULE | Freq: Two times a day (BID) | ORAL | Status: DC
  Administered 2015-11-13 – 2015-11-14 (×3): 100 mg via ORAL
  Filled 2015-11-13 (×3): qty 1

## 2015-11-13 NOTE — Progress Notes (Signed)
Md called back and gave a telephone order to change diet to full liquids. Pt notified

## 2015-11-13 NOTE — Plan of Care (Signed)
Problem: Urinary Elimination: Goal: Ability to achieve and maintain adequate urine output will improve Outcome: Progressing Pt voiding with no difficulty

## 2015-11-13 NOTE — Progress Notes (Signed)
2mg  iv morphine administered for c/o abdominal pain. Pt says he has been having cramps since he ate breakfast this morning. Asking for a diet downgrade to liquids

## 2015-11-13 NOTE — Progress Notes (Signed)
5 Days Post-Op  Subjective: Passing flatus, pain controlled, ambulating  Objective: Vital signs in last 24 hours: Temp:  [97.9 F (36.6 C)-98 F (36.7 C)] 98 F (36.7 C) (12/10 0524) Pulse Rate:  [63-70] 63 (12/10 0524) Resp:  [18] 18 (12/10 0524) BP: (115-149)/(65-72) 149/72 mmHg (12/10 0524) SpO2:  [94 %-95 %] 94 % (12/10 0524) Last BM Date: 11/07/15  Intake/Output from previous day: 12/09 0701 - 12/10 0700 In: 3642.9 [P.O.:1380; I.V.:1862.9; IV Piggyback:400] Out: 2275 [Urine:2050; Drains:225] Intake/Output this shift:    General appearance: no distress Resp: clear to auscultation bilaterally Cardio: regular rate and rhythm GI: incision clean, drains serous, bs present approp tender  Lab Results:  No results for input(s): WBC, HGB, HCT, PLT in the last 72 hours. BMET  Recent Labs  11/12/15 0924 11/13/15 0420  NA 136 136  K 3.0* 3.5  CL 104 104  CO2 26 26  GLUCOSE 98 90  BUN <5* <5*  CREATININE 1.02 0.92  CALCIUM 8.5* 8.4*   PT/INR No results for input(s): LABPROT, INR in the last 72 hours. ABG No results for input(s): PHART, HCO3 in the last 72 hours.  Invalid input(s): PCO2, PO2  Studies/Results: No results found.  Anti-infectives: Anti-infectives    Start     Dose/Rate Route Frequency Ordered Stop   11/08/15 0915  ceFAZolin (ANCEF) IVPB 2 g/50 mL premix     2 g 100 mL/hr over 30 Minutes Intravenous To [facility] 11/07/15 1511 11/08/15 0929      Assessment/Plan: POD 5 incisional hernia repair ,tar  1. Oxy with iv backup, mm relaxant 2. aggressive pulm toilet, needs to be oob as much as possible 3. Regular diet today 4. k replaced, bmet normal today 5. lovenox, scds 6. Continue home meds 7. Hopefully home tomorrow  [facility] 11/13/2015

## 2015-11-13 NOTE — Progress Notes (Signed)
Pt ambulated about 679feet with no distress noted

## 2015-11-13 NOTE — Progress Notes (Signed)
4mg iv zofran administered for c/o nausea 

## 2015-11-14 MED ORDER — DOCUSATE SODIUM 100 MG PO CAPS: 100.0000 mg | ORAL_CAPSULE | Freq: Two times a day (BID) | ORAL | Status: DC

## 2015-11-14 MED ORDER — METHOCARBAMOL 500 MG PO TABS: 500.0000 mg | ORAL_TABLET | Freq: Three times a day (TID) | ORAL | Status: DC | PRN

## 2015-11-14 MED ORDER — OXYCODONE HCL 10 MG PO TABS: 10.0000 mg | ORAL_TABLET | ORAL | Status: DC | PRN

## 2015-11-14 NOTE — Progress Notes (Signed)
6 Days Post-Op  Subjective: Doing fine this am having bowel function wants to go home  Objective: Vital signs in last 24 hours: Temp:  [97.4 F (36.3 C)-98.8 F (37.1 C)] 97.4 F (36.3 C) (12/11 0523) Pulse Rate:  [63-75] 63 (12/11 0523) Resp:  [18] 18 (12/11 0523) BP: (136-140)/(69-72) 136/72 mmHg (12/11 0523) SpO2:  [93 %-98 %] 98 % (12/11 0523) Last BM Date: 11/07/15  Intake/Output from previous day: 12/10 0701 - 12/11 0700 In: 522 [P.O.:522] Out: 1645 [id number]; Drains:175] Intake/Output this shift:    Resp: clear to auscultation bilaterally Cardio: regular rate and rhythm GI: drains serous , bs present incision clean without infection  Lab Results:  No results for input(s): WBC, HGB, HCT, PLT in the last 72 hours. BMET  Recent Labs  11/12/15 0924 11/13/15 0420  NA 136 136  K 3.0* 3.5  CL 104 104  CO2 26 26  GLUCOSE 98 90  BUN <5* <5*  CREATININE 1.02 0.92  CALCIUM 8.5* 8.4*   PT/INR No results for input(s): LABPROT, INR in the last 72 hours. ABG No results for input(s): PHART, HCO3 in the last 72 hours.  Invalid input(s): PCO2, PO2  Studies/Results: No results found.  Anti-infectives: Anti-infectives    Start     Dose/Rate Route Frequency Ordered Stop   11/08/15 0915  ceFAZolin (ANCEF) IVPB 2 g/50 mL premix     2 g 100 mL/hr over 30 Minutes Intravenous To [facility] 11/07/15 1511 11/08/15 0929      Assessment/Plan: POD 6 incisional hernia repair ,tar  Home today Will return Wednesday for drain removal  [facility] 11/14/2015

## 2015-11-14 NOTE — Discharge Instructions (Signed)
[facility]      [facility] [phone]  OPEN ABDOMINAL SURGERY: POST OP INSTRUCTIONS  Always review your discharge instruction sheet given to you by the facility where your surgery was performed.  IF YOU HAVE DISABILITY OR FAMILY LEAVE FORMS, YOU MUST BRING THEM TO THE OFFICE FOR PROCESSING.  PLEASE DO NOT GIVE THEM TO YOUR DOCTOR.  1. A prescription for pain medication may be given to you upon discharge.  Take your pain medication as prescribed, if needed.  If narcotic pain medicine is not needed, then you may take acetaminophen (Tylenol) or ibuprofen (Advil) as needed. 2. Take your usually prescribed medications unless otherwise directed. 3. If you need a refill on your pain medication, please contact your pharmacy. They will contact our office to request authorization.  Prescriptions will not be filled after 5pm or on week-ends. 4. You should follow a light diet the first few days after arrival home, such as soup and crackers, pudding, etc.unless your doctor has advised otherwise. A high-fiber, low fat diet can be resumed as tolerated.   Be sure to include lots of fluids daily. Most patients will experience some swelling and bruising on the chest and neck area.  Ice packs will help.  Swelling and bruising can take several days to resolve 5. Most patients will experience some swelling and bruising in the area of the incision. Ice pack will help. Swelling and bruising can take several days to resolve..  6. It is common to experience some constipation if taking pain medication after surgery.  Increasing fluid intake and taking a stool softener will usually help or prevent this problem from occurring.  A mild laxative (Milk of Magnesia or Miralax) should be taken according to package directions if there are no bowel movements after 48 hours. 7.  You may have steri-strips (small skin tapes) in place directly over the incision.  These strips should be left on the skin for 7-10 days.  If your  surgeon used skin glue on the incision, you may shower in 24 hours.  The glue will flake off over the next 2-3 weeks.  Any sutures or staples will be removed at the office during your follow-up visit. You may find that a light gauze bandage over your incision may keep your staples from being rubbed or pulled. You may shower and replace the bandage daily. 8. ACTIVITIES:  You may resume regular (light) daily activities beginning the next day--such as daily self-care, walking, climbing stairs--gradually increasing activities as tolerated.  You may have sexual intercourse when it is comfortable.  Refrain from any heavy lifting or straining until approved by your doctor. a. You may drive when you no longer are taking prescription pain medication, you can comfortably wear a seatbelt, and you can safely maneuver your car and apply brakes b. Return to Work: ___________________________________ 9. You should see your doctor in the office for a follow-up appointment approximately two weeks after your surgery.  Make sure that you call for this appointment within a day or two after you arrive home to insure a convenient appointment time. OTHER INSTRUCTIONS:  _____________________________________________________________ _____________________________________________________________  WHEN TO CALL YOUR DOCTOR: 1. Fever over 101.0 2. Inability to urinate 3. Nausea and/or vomiting 4. Extreme swelling or bruising 5. Continued bleeding from incision. 6. Increased pain, redness, or drainage from the incision. 7. Difficulty swallowing or breathing 8. Muscle cramping or spasms. 9. Numbness or tingling in hands or feet or around lips.  The clinic staff is available to   answer your questions during regular business hours.  Please don't hesitate to call and ask to speak to one of the nurses if you have concerns.  For further questions, please visit [email or web address]   

## 2015-11-14 NOTE — Progress Notes (Signed)
Patient taught how to empty JP drains with teachback

## 2015-11-14 NOTE — Progress Notes (Signed)
Art Buff to be D/C'd  per MD order. Discussed with the patient and all questions fully answered.  VSS, Skin clean, dry and intact without evidence of skin break down, no evidence of skin tears noted.  IV catheter discontinued intact. Site without signs and symptoms of complications. Dressing and pressure applied.  An After Visit Summary was printed and given to the patient. Patient received prescription.  D/c education completed with patient/family including follow up instructions, medication list, d/c activities limitations if indicated, with other d/c instructions as indicated by MD - patient able to verbalize understanding, all questions fully answered.   Patient instructed to return to ED, call 911, or call MD for any changes in condition.   Patient to be escorted via [place], and D/C home via private auto.

## 2015-11-14 NOTE — Discharge Summary (Signed)
Physician Discharge Summary  Patient ID: AVROHOM MCELVEEN MRN: [id number] DOB/AGE: 55-15-1962 54 y.o.  Admit date: 11/08/2015 Discharge date: 11/14/2015  Admission Diagnoses: Incisional hernia PAD  Discharge Diagnoses:  Active Problems:   Incisional hernia   Discharged Condition:good  Hospital Course: 61 yom with symptomatic incisional hernia admitted and underwent preperitoneal mesh repair with TAR.  He did well postop and had diet advanced. His pain was well controlled. He was seen by PT and did well.  Was discharged home on regular diet.  Consults: none  Significant Diagnostic Studies: none  Treatments: surgery   Disposition: 01-Home or Self Care     Medication List    TAKE these medications        divalproex 500 MG 24 hr tablet  Commonly known as:  DEPAKOTE ER  Take 1,500 mg by mouth daily.     docusate sodium 100 MG capsule  Commonly known as:  COLACE  Take 1 capsule (100 mg total) by mouth 2 (two) times daily.     lithium carbonate 450 MG CR tablet  Commonly known as:  ESKALITH  Take 450 mg by mouth 2 (two) times daily.     methocarbamol 500 MG tablet  Commonly known as:  ROBAXIN  Take 1 tablet (500 mg total) by mouth every 8 (eight) hours as needed for muscle spasms.     Oxycodone HCl 10 MG Tabs  Take 1 tablet (10 mg total) by mouth every 4 (four) hours as needed for moderate pain.     pantoprazole 40 MG tablet  Commonly known as:  PROTONIX  Take 40 mg by mouth daily before breakfast.           Follow-up Information    Follow up with Rolm Bookbinder, MD In 3 days.   Specialty:  General Surgery   Contact information:   [address] [place] [address] [phone]       Signed: Rolm Bookbinder 11/14/2015, 8:54 PM

## 2015-11-19 ENCOUNTER — Encounter: Payer: Self-pay | Admitting: Family

## 2015-11-24 ENCOUNTER — Encounter: Payer: Self-pay | Admitting: Family

## 2015-11-24 ENCOUNTER — Ambulatory Visit (INDEPENDENT_AMBULATORY_CARE_PROVIDER_SITE_OTHER): Payer: Medicaid Other | Admitting: Family

## 2015-11-24 VITALS — BP 114/73 | HR 66 | Ht 67.0 in | Wt 157.0 lb

## 2015-11-24 DIAGNOSIS — Z95828 Presence of other vascular implants and grafts: Secondary | ICD-10-CM

## 2015-11-24 DIAGNOSIS — Z87891 Personal history of nicotine dependence: Secondary | ICD-10-CM | POA: Diagnosis not present

## 2015-11-24 DIAGNOSIS — Z8679 Personal history of other diseases of the circulatory system: Secondary | ICD-10-CM

## 2015-11-24 DIAGNOSIS — Z48812 Encounter for surgical aftercare following surgery on the circulatory system: Secondary | ICD-10-CM

## 2015-11-24 DIAGNOSIS — I7409 Other arterial embolism and thrombosis of abdominal aorta: Secondary | ICD-10-CM

## 2015-11-24 NOTE — Addendum Note (Signed)
Addended by: Dorthula Rue L on: 11/24/2015 03:32 PM   Modules accepted: Orders

## 2015-11-24 NOTE — Progress Notes (Signed)
[facility] [place] HISTORY AND PHYSICAL -PAD  History of Present Illness [name] is a 54 y.o. male patient of Dr. 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 He has done well since that time but has developed a bulge in the upper part of his midline abdominal incision which has enlarged.   His past medical history includes CAD, Hepatitis C, and several psychiatric disorders.   Dr. Kellie Simmering last saw pt on 04/27/15. At that time Dr. Kellie Simmering felt that his ventral hernia should probably be repaired. Dr. Kellie Simmering referred pt to Dr. Serita Grammes for further evaluation and possible ventral hernia repair and this was performed 11/08/15 after his renal function improved.  Pt states he has not moved his bowels since the surgery but that he is passing gas.  Pt denies claudication sx's with walking. He denies non healing wounds.  Pt also denies any known history of stroke or TIA.    Pt Diabetic: No Pt smoker: former smoker, quit in June 2016. He uses vapor nicotine product.   Pt meds include: Statin :Yes Betablocker: No ASA: states he will ask his surgeon today when he sees him if OK to resume ASA Other anticoagulants/antiplatelets: no   Past Medical History  Diagnosis Date  . Hyperlipidemia   . Myocardial infarction ([place]) 1996; 1997; 2000's    "total of 3" (10/22/2012)  . Anxiety   . Depression   . Schizophrenia ([place])   . PAD (peripheral artery disease) ([place])     2010 PTA & stent to left CIA & left SFA; 05/2012 PTA & stent to distal LCIA; 10/2012 thrombectomy and stents to [place]  . History of ETOH abuse     quit 2007  . Tobacco abuse   . Broken fingers   . Coronary artery disease     s/p PCI to RCA at [facility]; s/p PCI to RCA '08; Cath 05/2012 showed known occluded LCx, RCA stent w/ mild restenosis, LAD no significant dz, EF 35% . Cardiac cath in July of 2014  showed no significant change.   Marland Kitchen GERD (gastroesophageal reflux disease)     takes Protonix daily  . Bipolar affective ([place])     takes Lithium daily  . Hypertension     "pt. denies - no longer taking blood pressure medications"  . Seizures ([place])     "when I get anxious" (10/22/2012)  . Hepatitis C     "diagnosed in the past 2 wk" (10/22/2012)  . Urinary frequency     Social History Social History  Substance Use Topics  . Smoking status: Former Smoker -- 1.00 packs/day for 40 years    Types: Cigarettes    Quit date: 07/05/2015  . Smokeless tobacco: Never Used  . Alcohol Use: No     Comment: "last drink was before 11/2014, haven't drank in over a year" 10/2015    Family History Family History  Problem Relation Age of Onset  . Emphysema Father   . Diabetes Other   . Alcohol abuse Other   . Hypertension Other   . Hyperlipidemia Other   . Seizures Other   . COPD Mother     Past Surgical History  Procedure Laterality Date  . Subdural hematoma evacuation via craniotomy    . Anterior cervical decomp/discectomy fusion  01/2011    plate & screw placement   . Iliac artery stent  10/22/2012    "2" (  10/22/2012)  . Brain surgery    . Posterior fusion cervical spine  2012  . Liver biopsy  10/2012  . Cardiac catheterization      Stent - 12  . Coronary angioplasty with stent placement  03/2007    to RCA  . Iliac artery stent  06/2012    left  . Left heart catheterization with coronary angiogram N/A 05/29/2012    Procedure: LEFT HEART CATHETERIZATION WITH CORONARY ANGIOGRAM;  Surgeon: Wellington Hampshire, MD;  Location: [place] CATH LAB;  Service: Cardiovascular;  Laterality: N/A;  . Lower extremity angiogram N/A 05/29/2012    Procedure: LOWER EXTREMITY ANGIOGRAM;  Surgeon: Wellington Hampshire, MD;  Location: [place] CATH LAB;  Service: Cardiovascular;  Laterality: N/A;  . Percutaneous stent intervention Left 05/29/2012    Procedure: PERCUTANEOUS STENT INTERVENTION;  Surgeon: Wellington Hampshire, MD;   Location: [place] CATH LAB;  Service: Cardiovascular;  Laterality: Left;  . Abdominal aortagram N/A 10/22/2012    Procedure: ABDOMINAL Maxcine Ham;  Surgeon: Wellington Hampshire, MD;  Location: [facility] CATH LAB;  Service: Cardiovascular;  Laterality: N/A;  . Abdominal aortagram N/A 11/11/2014    Procedure: ABDOMINAL Maxcine Ham;  Surgeon: Wellington Hampshire, MD;  Location: [place] CATH LAB;  Service: Cardiovascular;  Laterality: N/A;  . Aorta - bilateral femoral artery bypass graft N/A 11/20/2014    Procedure: AORTA BIFEMORAL BYPASS GRAFT;  Surgeon: Mal Misty, MD;  Location: [place];  Service: Vascular;  Laterality: N/A;  . Incisional hernia repair N/A 11/08/2015    Procedure: INCISIONAL HERNIA REPAIR WITH MYOFASCIAL RELEASE;  Surgeon: Rolm Bookbinder, MD;  Location: [place];  Service: General;  Laterality: N/A;  . Insertion of mesh N/A 11/08/2015    Procedure: INSERTION OF MESH;  Surgeon: Rolm Bookbinder, MD;  Location: [place];  Service: General;  Laterality: N/A;    No Known Allergies  Current Outpatient Prescriptions  Medication Sig Dispense Refill  . divalproex (DEPAKOTE ER) 500 MG 24 hr tablet Take 1,500 mg by mouth daily.   2  . docusate sodium (COLACE) 100 MG capsule Take 1 capsule (100 mg total) by mouth 2 (two) times daily. 30 capsule 2  . lithium carbonate (ESKALITH) 450 MG CR tablet Take 450 mg by mouth 2 (two) times daily.     . methocarbamol (ROBAXIN) 500 MG tablet Take 1 tablet (500 mg total) by mouth every 8 (eight) hours as needed for muscle spasms. 20 tablet 0  . oxyCODONE 10 MG TABS Take 1 tablet (10 mg total) by mouth every 4 (four) hours as needed for moderate pain. 30 tablet 0  . pantoprazole (PROTONIX) 40 MG tablet Take 40 mg by mouth daily before breakfast.  3   No current facility-administered medications for this visit.    ROS: See HPI for pertinent positives and negatives.   Physical Examination  Filed Vitals:   11/24/15 0855  BP: 114/73  Pulse: 66  Height: 5\' 7"  (1.702 m)   Weight: 157 lb (71.215 kg)  SpO2: 100%   Body mass index is 24.58 kg/(m^2).  General: A&O x 3, WDWN. Gait: normal Eyes: PERRLA. Pulmonary: CTAB, without wheezes , rales or rhonchi. Cardiac: regular rhythm, no detected murmur.     Carotid Bruits Right Left   Negative Negative  Aorta is not palpable. Radial pulses: 1+ palpable and =   VASCULAR EXAM: Extremities without ischemic changes, without Gangrene; without open wounds.     LE Pulses Right Left   FEMORAL 3+ palpable 3+ palpable    POPLITEAL  not palpable  not palpable   POSTERIOR TIBIAL not palpable  not palpable    DORSALIS PEDIS  ANTERIOR TIBIAL 2+ palpable  2+ palpable    Abdomen: soft, NT, large abdominal dressing in place with small to moderate amount sero-sanguinous drainage. Hypoactive bowel sounds.  Skin: no rashes, no ulcers. Musculoskeletal: no muscle wasting or atrophy. Neurologic: A&O X 3; Appropriate Affect; MOTOR FUNCTION: moving all extremities equally, motor strength 4/5 throughout. Speech is fluent/normal. CN 2-12 intact.           Non-Invasive Vascular Imaging: DATE: 09/30/15 ABI   R: 1.13 (1.10, 03/09/15), DP: triphasic, PT: triphasic, TBI: 0.77  L: 1.03 (1.0), DP: triphasic, PT: triphasic, TBI: 0.86    ASSESSMENT: [name] is a 54 y.o. male who is s/p aortobifemoral bypass grafting in December 2015 for severe aortoiliac occlusive disease.  ABI's and TBI's on 09/30/15 are normal bilaterally with all triphasic waveforms. Resume daily 81 mg ASA when his surgeon agrees to resume. Daily ASA has been shown to significantly reduce the incidence of CVD events. The pt sees his general surgeon later today. Continue graduated walking program; defer to Dr. Donne Hazel  for any restrictions re this.  Fortunately he has stopped tobacco use, is using a nicotine vapor product and I advised him to wean off this as soon as possible.    PLAN:  Based on the patient's vascular studies and examination, pt will return to clinic in 10 months for ABI's and evaluation by me. October of 2017 will be a year after his last ABI's.  I discussed in depth with the patient the nature of atherosclerosis, and emphasized the importance of maximal medical management including strict control of blood pressure, blood glucose, and lipid levels, obtaining regular exercise, and continued cessation of smoking.  The patient is aware that without maximal medical management the underlying atherosclerotic disease process will progress, limiting the benefit of any interventions.  The patient was given information about PAD including signs, symptoms, treatment, what symptoms should prompt the patient to seek immediate medical care, and risk reduction measures to take.  Clemon Chambers, RN, MSN, FNP-C [facility] Phone: [phone]  Clinic MD: Scot Dock  11/24/2015 8:59 AM

## 2015-11-24 NOTE — Patient Instructions (Signed)
Peripheral Vascular Disease Peripheral vascular disease (PVD) is a disease of the blood vessels that are not part of your heart and brain. A simple term for PVD is poor circulation. In most cases, PVD narrows the blood vessels that carry blood from your heart to the rest of your body. This can result in a decreased supply of blood to your arms, legs, and internal organs, like your stomach or kidneys. However, it most often affects a person's lower legs and feet. There are two types of PVD.  Organic PVD. This is the more common type. It is caused by damage to the structure of blood vessels.  Functional PVD. This is caused by conditions that make blood vessels contract and tighten (spasm). Without treatment, PVD tends to get worse over time. PVD can also lead to acute ischemic limb. This is when an arm or limb suddenly has trouble getting enough blood. This is a medical emergency. CAUSES Each type of PVD has many different causes. The most common cause of PVD is buildup of a fatty material (plaque) inside of your arteries (atherosclerosis). Small amounts of plaque can break off from the walls of the blood vessels and become lodged in a smaller artery. This blocks blood flow and can cause acute ischemic limb. Other common causes of PVD include:  Blood clots that form inside of blood vessels.  Injuries to blood vessels.  Diseases that cause inflammation of blood vessels or cause blood vessel spasms.  Health behaviors and health history that increase your risk of developing PVD. RISK FACTORS  You may have a greater risk of PVD if you:  Have a family history of PVD.  Have certain medical conditions, including:  High cholesterol.  Diabetes.  High blood pressure (hypertension).  Coronary heart disease.  Past problems with blood clots.  Past injury, such as burns or a broken bone. These may have damaged blood vessels in your limbs.  Buerger disease. This is caused by inflamed blood  vessels in your hands and feet.  Some forms of arthritis.  Rare birth defects that affect the arteries in your legs.  Use tobacco.  Do not get enough exercise.  Are obese.  Are age 50 or older. SIGNS AND SYMPTOMS  PVD may cause many different symptoms. Your symptoms depend on what part of your body is not getting enough blood. Some common signs and symptoms include:  Cramps in your lower legs. This may be a symptom of poor leg circulation (claudication).  Pain and weakness in your legs while you are physically active that goes away when you rest (intermittent claudication).  Leg pain when at rest.  Leg numbness, tingling, or weakness.  Coldness in a leg or foot, especially when compared with the other leg.  Skin or hair changes. These can include:  Hair loss.  Shiny skin.  Pale or bluish skin.  Thick toenails.  Inability to get or maintain an erection (erectile dysfunction). People with PVD are more prone to developing ulcers and sores on their toes, feet, or legs. These may take longer than normal to heal. DIAGNOSIS Your health care provider may diagnose PVD from your signs and symptoms. The health care provider will also do a physical exam. You may have tests to find out what is causing your PVD and determine its severity. Tests may include:  Blood pressure recordings from your arms and legs and measurements of the strength of your pulses (pulse volume recordings).  Imaging studies using sound waves to take pictures of   the blood flow through your blood vessels (Doppler ultrasound).  Injecting a dye into your blood vessels before having imaging studies using:  X-rays (angiogram or arteriogram).  Computer-generated X-rays (CT angiogram).  A powerful electromagnetic field and a computer (magnetic resonance angiogram or MRA). TREATMENT Treatment for PVD depends on the cause of your condition and the severity of your symptoms. It also depends on your age. Underlying  causes need to be treated and controlled. These include long-lasting (chronic) conditions, such as diabetes, high cholesterol, and high blood pressure. You may need to first try making lifestyle changes and taking medicines. Surgery may be needed if these do not work. Lifestyle changes may include:  Quitting smoking.  Exercising regularly.  Following a low-fat, low-cholesterol diet. Medicines may include:  Blood thinners to prevent blood clots.  Medicines to improve blood flow.  Medicines to improve your blood cholesterol levels. Surgical procedures may include:  A procedure that uses an inflated balloon to open a blocked artery and improve blood flow (angioplasty).  A procedure to put in a tube (stent) to keep a blocked artery open (stent implant).  Surgery to reroute blood flow around a blocked artery (peripheral bypass surgery).  Surgery to remove dead tissue from an infected wound on the affected limb.  Amputation. This is surgical removal of the affected limb. This may be necessary in cases of acute ischemic limb that are not improved through medical or surgical treatments. HOME CARE INSTRUCTIONS  Take medicines only as directed by your health care provider.  Do not use any tobacco products, including cigarettes, chewing tobacco, or electronic cigarettes. If you need help quitting, ask your health care provider.  Lose weight if you are overweight, and maintain a healthy weight as directed by your health care provider.  Eat a diet that is low in fat and cholesterol. If you need help, ask your health care provider.  Exercise regularly. Ask your health care provider to suggest some good activities for you.  Use compression stockings or other mechanical devices as directed by your health care provider.  Take good care of your feet.  Wear comfortable shoes that fit well.  Check your feet often for any cuts or sores. SEEK MEDICAL CARE IF:  You have cramps in your legs  while walking.  You have leg pain when you are at rest.  You have coldness in a leg or foot.  Your skin changes.  You have erectile dysfunction.  You have cuts or sores on your feet that are not healing. SEEK IMMEDIATE MEDICAL CARE IF:  Your arm or leg turns cold and blue.  Your arms or legs become red, warm, swollen, painful, or numb.  You have chest pain or trouble breathing.  You suddenly have weakness in your face, arm, or leg.  You become very confused or lose the ability to speak.  You suddenly have a very bad headache or lose your vision.   This information is not intended to replace advice given to you by your health care provider. Make sure you discuss any questions you have with your health care provider.   Document Released: 12/28/2004 Document Revised: 12/11/2014 Document Reviewed: 04/30/2014 Elsevier Interactive Patient Education 2016 [employer]  

## 2015-12-17 ENCOUNTER — Encounter: Payer: Self-pay | Admitting: Physician Assistant

## 2015-12-20 ENCOUNTER — Encounter: Payer: Self-pay | Admitting: Cardiovascular Disease

## 2015-12-20 ENCOUNTER — Ambulatory Visit: Payer: Self-pay | Admitting: Physician Assistant

## 2015-12-20 ENCOUNTER — Ambulatory Visit (INDEPENDENT_AMBULATORY_CARE_PROVIDER_SITE_OTHER): Payer: Medicaid Other | Admitting: Cardiovascular Disease

## 2015-12-20 VITALS — BP 128/74 | HR 62 | Ht 67.0 in | Wt 152.0 lb

## 2015-12-20 DIAGNOSIS — I7409 Other arterial embolism and thrombosis of abdominal aorta: Secondary | ICD-10-CM | POA: Diagnosis not present

## 2015-12-20 DIAGNOSIS — E785 Hyperlipidemia, unspecified: Secondary | ICD-10-CM

## 2015-12-20 DIAGNOSIS — I2510 Atherosclerotic heart disease of native coronary artery without angina pectoris: Secondary | ICD-10-CM

## 2015-12-20 DIAGNOSIS — F17200 Nicotine dependence, unspecified, uncomplicated: Secondary | ICD-10-CM

## 2015-12-20 DIAGNOSIS — I10 Essential (primary) hypertension: Secondary | ICD-10-CM

## 2015-12-20 DIAGNOSIS — I255 Ischemic cardiomyopathy: Secondary | ICD-10-CM

## 2015-12-20 MED ORDER — ASPIRIN EC 81 MG PO TBEC: 81.0000 mg | DELAYED_RELEASE_TABLET | Freq: Every day | ORAL | Status: DC

## 2015-12-20 NOTE — Patient Instructions (Signed)
Medication Instructions: Resume Aspirin 81 mg once daily.   Labwork: None.   Procedures/Testing: None.   Follow-Up: 6 months with Dr. Fletcher Anon.   Any Additional Special Instructions Will Be Listed Below (If Applicable).

## 2015-12-20 NOTE — Progress Notes (Signed)
HPI  [name] is a 55 year old gentleman who is here today for a followup visit regarding PAD and coronary artery disease. He has a history of coronary artery disease, stent to his RCA in April 2008 with occluded circumflex, previous bilateral iliac stenting and most recently aortobifemoral bypass December 2015 by Dr. Kellie Simmering ,  mild to moderate renal artery stenosis on the left, history of hepatitis C, long alcohol history currently in remission, long history of smoking , hypertension, bipolar disorder and hyperlipidemia .  Most recent cardiac catheterization in July 2014 showed patent RCA stent with chronically occluded left circumflex artery. Ejection fraction was 40%.  He underwent abdominal hernia surgery last month without complications. He is overall doing well and denies chest pain or significant dyspnea. He cut down on tobacco use to about 6 cigarettes a day. He denies any alcohol use.    No Known Allergies   Current Outpatient Prescriptions on File Prior to Visit  Medication Sig Dispense Refill  . divalproex (DEPAKOTE ER) 500 MG 24 hr tablet Take 1,500 mg by mouth daily.   2  . docusate sodium (COLACE) 100 MG capsule Take 1 capsule (100 mg total) by mouth 2 (two) times daily. 30 capsule 2  . lithium carbonate (ESKALITH) 450 MG CR tablet Take 450 mg by mouth 2 (two) times daily.     . pantoprazole (PROTONIX) 40 MG tablet Take 40 mg by mouth daily before breakfast.  3   No current facility-administered medications on file prior to visit.     Past Medical History  Diagnosis Date  . Hyperlipidemia   . Myocardial infarction ([place]) 1996; 1997; 2000's    "total of 3" (10/22/2012)  . Anxiety   . Depression   . Schizophrenia ([place])   . PAD (peripheral artery disease) ([place])     2010 PTA & stent to left CIA & left SFA; 05/2012 PTA & stent to distal LCIA; 10/2012 thrombectomy and stents to [place]  . History of ETOH abuse     quit 2007  . Tobacco abuse   . Broken  fingers   . Coronary artery disease     a. s/p PCI to RCA at [facility]; b. s/p PCI to RCA '08; c. Cath 05/2012 showed known occluded LCx, RCA stent w/ mild restenosis, LAD no significant dz, EF 35%; d. Cardiac cath in July of 2014 showed no significant change.   Marland Kitchen GERD (gastroesophageal reflux disease)     takes Protonix daily  . Bipolar affective ([place])     takes Lithium daily  . Hypertension     "pt. denies - no longer taking blood pressure medications"  . Seizures ([place])     "when I get anxious" (10/22/2012)  . Hepatitis C     "diagnosed in the past 2 wk" (10/22/2012)  . Urinary frequency      Past Surgical History  Procedure Laterality Date  . Subdural hematoma evacuation via craniotomy    . Anterior cervical decomp/discectomy fusion  01/2011    plate & screw placement   . Iliac artery stent  10/22/2012    "2" (10/22/2012)  . Brain surgery    . Posterior fusion cervical spine  2012  . Liver biopsy  10/2012  . Cardiac catheterization      Stent - 12  . Coronary angioplasty with stent placement  03/2007    to RCA  . Iliac artery stent  06/2012    left  . Left heart  catheterization with coronary angiogram N/A 05/29/2012    Procedure: LEFT HEART CATHETERIZATION WITH CORONARY ANGIOGRAM;  Surgeon: Wellington Hampshire, MD;  Location: [place] CATH LAB;  Service: Cardiovascular;  Laterality: N/A;  . Lower extremity angiogram N/A 05/29/2012    Procedure: LOWER EXTREMITY ANGIOGRAM;  Surgeon: Wellington Hampshire, MD;  Location: [place] CATH LAB;  Service: Cardiovascular;  Laterality: N/A;  . Percutaneous stent intervention Left 05/29/2012    Procedure: PERCUTANEOUS STENT INTERVENTION;  Surgeon: Wellington Hampshire, MD;  Location: [place] CATH LAB;  Service: Cardiovascular;  Laterality: Left;  . Abdominal aortagram N/A 10/22/2012    Procedure: ABDOMINAL Maxcine Ham;  Surgeon: Wellington Hampshire, MD;  Location: [facility] CATH LAB;  Service: Cardiovascular;  Laterality: N/A;  . Abdominal aortagram N/A 11/11/2014    Procedure:  ABDOMINAL Maxcine Ham;  Surgeon: Wellington Hampshire, MD;  Location: [place] CATH LAB;  Service: Cardiovascular;  Laterality: N/A;  . Aorta - bilateral femoral artery bypass graft N/A 11/20/2014    Procedure: AORTA BIFEMORAL BYPASS GRAFT;  Surgeon: Mal Misty, MD;  Location: [place];  Service: Vascular;  Laterality: N/A;  . Incisional hernia repair N/A 11/08/2015    Procedure: INCISIONAL HERNIA REPAIR WITH MYOFASCIAL RELEASE;  Surgeon: Rolm Bookbinder, MD;  Location: [place];  Service: General;  Laterality: N/A;  . Insertion of mesh N/A 11/08/2015    Procedure: INSERTION OF MESH;  Surgeon: Rolm Bookbinder, MD;  Location: [facility] OR;  Service: General;  Laterality: N/A;     Family History  Problem Relation Age of Onset  . Emphysema Father   . Diabetes Other   . Alcohol abuse Other   . Hypertension Other   . Hyperlipidemia Other   . Seizures Other   . COPD Mother      Social History   Social History  . Marital Status: Divorced    Spouse Name: N/A  . Number of Children: N/A  . Years of Education: N/A   Occupational History  . Disabled    Social History Main Topics  . Smoking status: Light Tobacco Smoker -- 1.00 packs/day for 40 years    Types: Cigarettes    Last Attempt to Quit: 07/05/2015  . Smokeless tobacco: Never Used  . Alcohol Use: No     Comment: "last drink was before 11/2014, haven't drank in over a year" 10/2015  . Drug Use: No  . Sexual Activity: Yes   Other Topics Concern  . Not on file   Social History Narrative   17 years Bipolar/ADHD.   Pt gets regular exercise.     PHYSICAL EXAM   BP 128/74 mmHg  Pulse 62  Ht 5\' 7"  (1.702 m)  Wt 152 lb (68.947 kg)  BMI 23.80 kg/m2  Constitutional: He is oriented to person, place, and time. He appears well-developed and well-nourished. No distress.  HENT: No nasal discharge.  Head: Normocephalic and atraumatic.  Eyes: Pupils are equal and round. Right eye exhibits no discharge. Left eye exhibits no discharge.  Neck:  Normal range of motion. Neck supple. No JVD present. No thyromegaly present.  Cardiovascular: Normal rate, regular rhythm, normal heart sounds and. Exam reveals no gallop and no friction rub. No murmur heard.  Pulmonary/Chest: Effort normal and breath sounds normal. No stridor. No respiratory distress. He has no wheezes. He has no rales. He exhibits no tenderness.  Abdominal: Soft. Bowel sounds are normal. He exhibits no distension. There is no tenderness. There is no rebound and no guarding.  Musculoskeletal: Normal range of motion. He exhibits  no edema and no tenderness.  Neurological: He is alert and oriented to person, place, and time. Coordination normal.  Skin: Skin is warm and dry. No rash noted. He is not diaphoretic. No erythema. No pallor.  Psychiatric: He has a normal mood and affect. His behavior is normal. Judgment and thought content normal.    EKG: Normal sinus rhythm with left axis deviation and possible old inferior infarct.  ASSESSMENT AND PLAN

## 2015-12-20 NOTE — Assessment & Plan Note (Signed)
He is doing very well with no symptoms suggestive of angina. I resumed low-dose aspirin today.

## 2015-12-20 NOTE — Assessment & Plan Note (Signed)
He was switched from lisinopril to carvedilol and his blood pressure is well controlled

## 2015-12-20 NOTE — Assessment & Plan Note (Signed)
He is status post aortobifemoral bypass and is followed at [facility]. Most recent ABI was normal.

## 2015-12-20 NOTE — Assessment & Plan Note (Signed)
He was able to cut down to 6 cigarettes a day but he is not able to quit smoking completely. I did discuss with him the importance of smoking cessation.

## 2015-12-20 NOTE — Assessment & Plan Note (Signed)
Continue treatment with atorvastatin with target LDL of less than 70.

## 2016-03-27 ENCOUNTER — Encounter (HOSPITAL_COMMUNITY): Payer: Self-pay | Admitting: Emergency Medicine

## 2016-03-27 ENCOUNTER — Emergency Department (HOSPITAL_COMMUNITY)
Admission: EM | Admit: 2016-03-27 | Discharge: 2016-03-27 | Discharge status: Against Medical Advice | Payer: Medicaid Other | Attending: Emergency Medicine | Admitting: Emergency Medicine

## 2016-03-27 DIAGNOSIS — F17210 Nicotine dependence, cigarettes, uncomplicated: Secondary | ICD-10-CM | POA: Insufficient documentation

## 2016-03-27 DIAGNOSIS — I10 Essential (primary) hypertension: Secondary | ICD-10-CM | POA: Insufficient documentation

## 2016-03-27 DIAGNOSIS — K219 Gastro-esophageal reflux disease without esophagitis: Secondary | ICD-10-CM | POA: Diagnosis not present

## 2016-03-27 DIAGNOSIS — Z87828 Personal history of other (healed) physical injury and trauma: Secondary | ICD-10-CM | POA: Diagnosis not present

## 2016-03-27 DIAGNOSIS — I2510 Atherosclerotic heart disease of native coronary artery without angina pectoris: Secondary | ICD-10-CM | POA: Insufficient documentation

## 2016-03-27 DIAGNOSIS — F319 Bipolar disorder, unspecified: Secondary | ICD-10-CM | POA: Insufficient documentation

## 2016-03-27 DIAGNOSIS — Z8619 Personal history of other infectious and parasitic diseases: Secondary | ICD-10-CM | POA: Diagnosis not present

## 2016-03-27 DIAGNOSIS — Z9861 Coronary angioplasty status: Secondary | ICD-10-CM | POA: Diagnosis not present

## 2016-03-27 DIAGNOSIS — Z79899 Other long term (current) drug therapy: Secondary | ICD-10-CM | POA: Diagnosis not present

## 2016-03-27 DIAGNOSIS — Z7982 Long term (current) use of aspirin: Secondary | ICD-10-CM | POA: Diagnosis not present

## 2016-03-27 DIAGNOSIS — I252 Old myocardial infarction: Secondary | ICD-10-CM | POA: Diagnosis not present

## 2016-03-27 DIAGNOSIS — R109 Unspecified abdominal pain: Secondary | ICD-10-CM | POA: Insufficient documentation

## 2016-03-27 DIAGNOSIS — R197 Diarrhea, unspecified: Secondary | ICD-10-CM | POA: Insufficient documentation

## 2016-03-27 DIAGNOSIS — F209 Schizophrenia, unspecified: Secondary | ICD-10-CM | POA: Diagnosis not present

## 2016-03-27 DIAGNOSIS — E785 Hyperlipidemia, unspecified: Secondary | ICD-10-CM | POA: Insufficient documentation

## 2016-03-27 DIAGNOSIS — R1013 Epigastric pain: Secondary | ICD-10-CM | POA: Diagnosis present

## 2016-03-27 DIAGNOSIS — Z9889 Other specified postprocedural states: Secondary | ICD-10-CM | POA: Insufficient documentation

## 2016-03-27 LAB — COMPREHENSIVE METABOLIC PANEL
ALT: 19 U/L (ref 17–63)
AST: 20 U/L (ref 15–41)
Albumin: 4.3 g/dL (ref 3.5–5.0)
Alkaline Phosphatase: 58 U/L (ref 38–126)
Anion gap: 13 (ref 5–15)
BUN: 7 mg/dL (ref 6–20)
CO2: 22 mmol/L (ref 22–32)
Calcium: 9.8 mg/dL (ref 8.9–10.3)
Chloride: 104 mmol/L (ref 101–111)
Creatinine, Ser: 0.89 mg/dL (ref 0.61–1.24)
GFR calc Af Amer: >60 mL/min (ref >60)
GFR calc non Af Amer: >60 mL/min (ref >60)
Glucose, Bld: 89 mg/dL (ref 65–99)
Potassium: 4.5 mmol/L (ref 3.5–5.1)
Sodium: 139 mmol/L (ref 135–145)
Total Bilirubin: 0.7 mg/dL (ref 0.3–1.2)
Total Protein: 7.7 g/dL (ref 6.5–8.1)

## 2016-03-27 LAB — URINALYSIS, ROUTINE W REFLEX MICROSCOPIC
Bilirubin Urine: NEGATIVE
Glucose, UA: NEGATIVE mg/dL
Hgb urine dipstick: NEGATIVE
Ketones, ur: NEGATIVE mg/dL
Leukocytes, UA: NEGATIVE
Nitrite: NEGATIVE
Protein, ur: NEGATIVE mg/dL
Specific Gravity, Urine: 1.01 (ref 1.005–1.030)
pH: 5.5 (ref 5.0–8.0)

## 2016-03-27 LAB — CBC
HCT: 49.2 % (ref 39.0–52.0)
Hemoglobin: 16.6 g/dL (ref 13.0–17.0)
MCH: 31.8 pg (ref 26.0–34.0)
MCHC: 33.7 g/dL (ref 30.0–36.0)
MCV: 94.3 fL (ref 78.0–100.0)
Platelets: 188 10*3/uL (ref 150–400)
RBC: 5.22 MIL/uL (ref 4.22–5.81)
RDW: 13.2 % (ref 11.5–15.5)
WBC: 10.2 10*3/uL (ref 4.0–10.5)

## 2016-03-27 LAB — LIPASE, BLOOD: Lipase: 29 U/L (ref 11–51)

## 2016-03-27 NOTE — ED Notes (Signed)
Pt c/o RUQ abd pain x 1 week. Pt states he had hernia surgery in December and has had ongoing pain and constipation since surgery. Pt states the pain has been worse over the last week. Pt denies N/V.

## 2016-03-27 NOTE — ED Notes (Signed)
Pt walked out of room after talking with the doctor and threw his labels on the desk and said he was not staying here any longer and we could call him with his lab results, the patient then walked out of the ed

## 2016-03-27 NOTE — ED Provider Notes (Signed)
CSN: [id number]     Arrival date & time 03/27/16  1037 History   First MD Initiated Contact with Patient 03/27/16 1114     Chief Complaint  Patient presents with  . Abdominal Pain     Patient is a 55 y.o. male presenting with abdominal pain. The history is provided by the patient. No language interpreter was used.  Abdominal Pain  [name] is a 55 y.o. male who presents to the Emergency Department complaining of abdominal pain.  He reports right sided abdominal pain that has been constant since hernia surgery in January.  Two days ago he moved his arm and developed increased central and epigastric abdominal pain described as a sharp pain that radiated to his back.  He denies any new chest pain/sob.  No fevers, nausea, vomiting, dysuria.  He has occasional diarrhea.  Sxs are moderate and worsening.    Past Medical History  Diagnosis Date  . Hyperlipidemia   . Myocardial infarction ([place]) 1996; 1997; 2000's    "total of 3" (10/22/2012)  . Anxiety   . Depression   . Schizophrenia ([place])   . PAD (peripheral artery disease) ([place])     2010 PTA & stent to left CIA & left SFA; 05/2012 PTA & stent to distal LCIA; 10/2012 thrombectomy and stents to [place]  . History of ETOH abuse     quit 2007  . Tobacco abuse   . Broken fingers   . Coronary artery disease     a. s/p PCI to RCA at [facility]; b. s/p PCI to RCA '08; c. Cath 05/2012 showed known occluded LCx, RCA stent w/ mild restenosis, LAD no significant dz, EF 35%; d. Cardiac cath in July of 2014 showed no significant change.   Marland Kitchen GERD (gastroesophageal reflux disease)     takes Protonix daily  . Bipolar affective ([place])     takes Lithium daily  . Hypertension     "pt. denies - no longer taking blood pressure medications"  . Seizures ([place])     "when I get anxious" (10/22/2012)  . Hepatitis C     "diagnosed in the past 2 wk" (10/22/2012)  . Urinary frequency    Past Surgical History  Procedure Laterality Date  . Subdural  hematoma evacuation via craniotomy    . Anterior cervical decomp/discectomy fusion  01/2011    plate & screw placement   . Iliac artery stent  10/22/2012    "2" (10/22/2012)  . Brain surgery    . Posterior fusion cervical spine  2012  . Liver biopsy  10/2012  . Cardiac catheterization      Stent - 12  . Coronary angioplasty with stent placement  03/2007    to RCA  . Iliac artery stent  06/2012    left  . Left heart catheterization with coronary angiogram N/A 05/29/2012    Procedure: LEFT HEART CATHETERIZATION WITH CORONARY ANGIOGRAM;  Surgeon: Wellington Hampshire, MD;  Location: [place] CATH LAB;  Service: Cardiovascular;  Laterality: N/A;  . Lower extremity angiogram N/A 05/29/2012    Procedure: LOWER EXTREMITY ANGIOGRAM;  Surgeon: Wellington Hampshire, MD;  Location: [place] CATH LAB;  Service: Cardiovascular;  Laterality: N/A;  . Percutaneous stent intervention Left 05/29/2012    Procedure: PERCUTANEOUS STENT INTERVENTION;  Surgeon: Wellington Hampshire, MD;  Location: [employer] CATH LAB;  Service: Cardiovascular;  Laterality: Left;  . Abdominal aortagram N/A 10/22/2012    Procedure: ABDOMINAL Maxcine Ham;  Surgeon: Wellington Hampshire, MD;  Location: [place] CATH LAB;  Service: Cardiovascular;  Laterality: N/A;  . Abdominal aortagram N/A 11/11/2014    Procedure: ABDOMINAL Maxcine Ham;  Surgeon: Wellington Hampshire, MD;  Location: [place] CATH LAB;  Service: Cardiovascular;  Laterality: N/A;  . Aorta - bilateral femoral artery bypass graft N/A 11/20/2014    Procedure: AORTA BIFEMORAL BYPASS GRAFT;  Surgeon: Mal Misty, MD;  Location: [place];  Service: Vascular;  Laterality: N/A;  . Incisional hernia repair N/A 11/08/2015    Procedure: INCISIONAL HERNIA REPAIR WITH MYOFASCIAL RELEASE;  Surgeon: Rolm Bookbinder, MD;  Location: [place];  Service: General;  Laterality: N/A;  . Insertion of mesh N/A 11/08/2015    Procedure: INSERTION OF MESH;  Surgeon: Rolm Bookbinder, MD;  Location: [facility] OR;  Service: General;  Laterality: N/A;   Family  History  Problem Relation Age of Onset  . Emphysema Father   . Diabetes Other   . Alcohol abuse Other   . Hypertension Other   . Hyperlipidemia Other   . Seizures Other   . COPD Mother    Social History  Substance Use Topics  . Smoking status: Light Tobacco Smoker -- 1.00 packs/day for 40 years    Types: Cigarettes    Last Attempt to Quit: 07/05/2015  . Smokeless tobacco: Never Used  . Alcohol Use: No     Comment: "last drink was before 11/2014, haven't drank in over a year" 10/2015    Review of Systems  Gastrointestinal: Positive for abdominal pain.  All other systems reviewed and are negative.     Allergies  Review of patient's allergies indicates no known allergies.  Home Medications   Prior to Admission medications   Medication Sig Start Date End Date Taking? Authorizing Provider  aspirin EC 81 MG tablet Take 1 tablet (81 mg total) by mouth daily. 12/20/15   Wellington Hampshire, MD  atorvastatin (LIPITOR) 20 MG tablet Take 20 mg by mouth daily.    Historical Provider, MD  carvedilol (COREG) 3.125 MG tablet Take 3.125 mg by mouth 2 (two) times daily with a meal.    Historical Provider, MD  divalproex (DEPAKOTE ER) 500 MG 24 hr tablet Take 1,500 mg by mouth daily.  08/09/15   Historical Provider, MD  docusate sodium (COLACE) 100 MG capsule Take 1 capsule (100 mg total) by mouth 2 (two) times daily. 11/14/15   Rolm Bookbinder, MD  lithium carbonate (ESKALITH) 450 MG CR tablet Take 450 mg by mouth 2 (two) times daily.  02/15/15   Historical Provider, MD  pantoprazole (PROTONIX) 40 MG tablet Take 40 mg by mouth daily before breakfast. 08/09/15   Historical Provider, MD   BP 158/96 mmHg  Pulse 95  Temp(Src) 97.8 F (36.6 C) (Oral)  Resp 18  Ht 5\' 7"  (1.702 m)  Wt 152 lb (68.947 kg)  BMI 23.80 kg/m2  SpO2 100% Physical Exam  Constitutional: He is oriented to person, place, and time. He appears well-developed and well-nourished.  HENT:  Head: Normocephalic and atraumatic.   Cardiovascular: Normal rate and regular rhythm.   No murmur heard. Pulmonary/Chest: Effort normal and breath sounds normal. No respiratory distress.  Abdominal: Soft. There is no rebound and no guarding.  Well healing midline abdominal incisional scar.  Mild right sided abdominal tenderness.    Musculoskeletal: He exhibits no edema or tenderness.  Neurological: He is alert and oriented to person, place, and time.  Skin: Skin is warm and dry.  Psychiatric: He has a normal mood and affect. His behavior  is normal.  Nursing note and vitals reviewed.   ED Course  Procedures (including critical care time) Labs Review Labs Reviewed  LIPASE, BLOOD  COMPREHENSIVE METABOLIC PANEL  CBC  URINALYSIS, ROUTINE W REFLEX MICROSCOPIC (NOT AT [facility])    Imaging Review No results found. I have personally reviewed and evaluated these images and lab results as part of my medical decision-making.   EKG Interpretation None      MDM   Final diagnoses:  None    Patient here for evaluation of abdominal pain that has been present since his surgery in January. He has a benign abdominal examination. Plan to check labs and reassess. Patient eloped from department after initial interview and examination.    Quintella Reichert, MD 03/27/16 1725

## 2016-04-19 ENCOUNTER — Other Ambulatory Visit: Payer: Self-pay | Admitting: Family Medicine

## 2016-04-19 DIAGNOSIS — R1011 Right upper quadrant pain: Secondary | ICD-10-CM

## 2016-04-24 ENCOUNTER — Ambulatory Visit
Admission: RE | Admit: 2016-04-24 | Discharge: 2016-04-24 | Disposition: A | Discharge status: Home / Self Care | Payer: Medicaid Other | Source: Ambulatory Visit | Attending: Family Medicine | Admitting: Family Medicine

## 2016-04-24 DIAGNOSIS — K824 Cholesterolosis of gallbladder: Secondary | ICD-10-CM | POA: Diagnosis not present

## 2016-04-24 DIAGNOSIS — R1011 Right upper quadrant pain: Secondary | ICD-10-CM | POA: Diagnosis not present

## 2016-04-24 DIAGNOSIS — K7689 Other specified diseases of liver: Secondary | ICD-10-CM | POA: Diagnosis not present

## 2016-05-22 ENCOUNTER — Encounter: Payer: Self-pay | Admitting: *Deleted

## 2016-05-31 ENCOUNTER — Ambulatory Visit (INDEPENDENT_AMBULATORY_CARE_PROVIDER_SITE_OTHER): Payer: Medicaid Other | Admitting: General Surgery

## 2016-05-31 ENCOUNTER — Encounter: Payer: Self-pay | Admitting: General Surgery

## 2016-05-31 VITALS — BP 160/88 | HR 70 | Resp 16 | Ht 67.0 in | Wt 152.0 lb

## 2016-05-31 DIAGNOSIS — R0781 Pleurodynia: Secondary | ICD-10-CM | POA: Diagnosis not present

## 2016-05-31 DIAGNOSIS — K824 Cholesterolosis of gallbladder: Secondary | ICD-10-CM | POA: Diagnosis not present

## 2016-05-31 DIAGNOSIS — R1084 Generalized abdominal pain: Secondary | ICD-10-CM | POA: Diagnosis not present

## 2016-05-31 NOTE — Progress Notes (Signed)
Patient ID: [name], male   DOB: Apr 22, 1961, 55 y.o.   MRN: [id number]  Chief Complaint  Patient presents with  . Abdominal Pain    HPI [name] is a 55 y.o. male here today for evaluation of his gall bladder. He states the right upper abdomen started hurting around December 2016 after his hernia surgery. He states he was in the hospital for 10 days with the surgery and a drain tube as well. He states the pain is constant and a dull ache, worse with lying down. No weight change and his appetite is good.No increased symptoms related to diet. He had an abdominal ultrasound in May 2017.  He admits to chronic nausea (since hernia surgery), no vomiting.  I personally reviewed the patient's history    HPI  Past Medical History  Diagnosis Date  . Hyperlipidemia   . Myocardial infarction ([place]) 1996; 1997; 2000's    "total of 3" (10/22/2012)  . Anxiety   . Depression   . Schizophrenia ([place])   . PAD (peripheral artery disease) ([place])     2010 PTA & stent to left CIA & left SFA; 05/2012 PTA & stent to distal LCIA; 10/2012 thrombectomy and stents to [place]  . History of ETOH abuse     quit 2007  . Tobacco abuse   . Broken fingers   . Coronary artery disease     a. s/p PCI to RCA at [facility]; b. s/p PCI to RCA '08; c. Cath 05/2012 showed known occluded LCx, RCA stent w/ mild restenosis, LAD no significant dz, EF 35%; d. Cardiac cath in July of 2014 showed no significant change.   Marland Kitchen GERD (gastroesophageal reflux disease)     takes Protonix daily  . Bipolar affective ([place])     takes Lithium daily  . Hypertension     "pt. denies - no longer taking blood pressure medications"  . Seizures ([place])     "when I get anxious" (10/22/2012)  . Hepatitis C     "diagnosed in the past 2 wk" (10/22/2012)  . Urinary frequency     Past Surgical History  Procedure Laterality Date  . Subdural hematoma evacuation via craniotomy    . Anterior cervical decomp/discectomy fusion   01/2011    plate & screw placement   . Iliac artery stent  10/22/2012    "2" (10/22/2012)  . Brain surgery    . Posterior fusion cervical spine  2012  . Liver biopsy  10/2012  . Cardiac catheterization      Stent - 12  . Coronary angioplasty with stent placement  03/2007    to RCA  . Iliac artery stent  06/2012    left  . Left heart catheterization with coronary angiogram N/A 05/29/2012    Procedure: LEFT HEART CATHETERIZATION WITH CORONARY ANGIOGRAM;  Surgeon: Wellington Hampshire, MD;  Location: [place] CATH LAB;  Service: Cardiovascular;  Laterality: N/A;  . Lower extremity angiogram N/A 05/29/2012    Procedure: LOWER EXTREMITY ANGIOGRAM;  Surgeon: Wellington Hampshire, MD;  Location: [place] CATH LAB;  Service: Cardiovascular;  Laterality: N/A;  . Percutaneous stent intervention Left 05/29/2012    Procedure: PERCUTANEOUS STENT INTERVENTION;  Surgeon: Wellington Hampshire, MD;  Location: [place] CATH LAB;  Service: Cardiovascular;  Laterality: Left;  . Abdominal aortagram N/A 10/22/2012    Procedure: ABDOMINAL Maxcine Ham;  Surgeon: Wellington Hampshire, MD;  Location: [facility] CATH LAB;  Service: Cardiovascular;  Laterality: N/A;  . Abdominal aortagram N/A  11/11/2014    Procedure: ABDOMINAL Maxcine Ham;  Surgeon: Wellington Hampshire, MD;  Location: [facility] CATH LAB;  Service: Cardiovascular;  Laterality: N/A;  . Aorta - bilateral femoral artery bypass graft N/A 11/20/2014    Procedure: AORTA BIFEMORAL BYPASS GRAFT;  Surgeon: Mal Misty, MD;  Location: [place];  Service: Vascular;  Laterality: N/A;  . Incisional hernia repair N/A 11/08/2015    Procedure: INCISIONAL HERNIA REPAIR WITH MYOFASCIAL RELEASE;  Surgeon: Rolm Bookbinder, MD;  Location: [place];  Service: General;  Laterality: N/A;  . Insertion of mesh N/A 11/08/2015    Procedure: INSERTION OF MESH;  Surgeon: Rolm Bookbinder, MD;  Location: [facility] OR;  Service: General;  Laterality: N/A;    Family History  Problem Relation Age of Onset  . Emphysema Father   . Diabetes Other   .  Alcohol abuse Other   . Hypertension Other   . Hyperlipidemia Other   . Seizures Other   . COPD Mother     Social History Social History  Substance Use Topics  . Smoking status: Current Every Day Smoker -- 0.50 packs/day for 40 years    Types: Cigarettes    Last Attempt to Quit: 07/05/2015  . Smokeless tobacco: Never Used  . Alcohol Use: No     Comment: "last drink was before 11/2014, haven't drank in over a year" 10/2015    No Known Allergies  Current Outpatient Prescriptions  Medication Sig Dispense Refill  . aspirin EC 81 MG tablet Take 1 tablet (81 mg total) by mouth daily. 90 tablet 3  . atorvastatin (LIPITOR) 20 MG tablet Take 20 mg by mouth daily.    . carvedilol (COREG) 3.125 MG tablet Take 3.125 mg by mouth 2 (two) times daily with a meal.    . divalproex (DEPAKOTE ER) 500 MG 24 hr tablet Take 1,500 mg by mouth daily.   2  . gabapentin (NEURONTIN) 100 MG capsule Take 100 mg by mouth 3 (three) times daily.     Marland Kitchen lithium carbonate (ESKALITH) 450 MG CR tablet Take 450 mg by mouth daily.     . pantoprazole (PROTONIX) 40 MG tablet Take 40 mg by mouth daily before breakfast.  3  . meloxicam (MOBIC) 15 MG tablet Take 1 tablet (15 mg total) by mouth daily. 30 tablet 0   No current facility-administered medications for this visit.    Review of Systems Review of Systems  Constitutional: Negative.   Respiratory: Negative.   Cardiovascular: Negative.   Gastrointestinal: Positive for nausea and abdominal pain. Negative for vomiting, diarrhea and constipation.  Endocrine: Negative.   Genitourinary: Negative.   Allergic/Immunologic: Negative.   Neurological: Negative.   Hematological: Negative.   Psychiatric/Behavioral: The patient is nervous/anxious.     Blood pressure 160/88, pulse 70, resp. rate 16, height 5\' 7"  (1.702 m), weight 152 lb (68.947 kg).  Physical Exam Physical Exam  Constitutional: He is oriented to person, place, and time. He appears well-developed and  well-nourished.  HENT:  Mouth/Throat: Oropharynx is clear and moist.  Eyes: Conjunctivae are normal. No scleral icterus.  Neck: Neck supple.  Cardiovascular: Normal rate, regular rhythm and normal heart sounds.   Pulses:      Femoral pulses are 2+ on the right side, and 2+ on the left side.      Dorsalis pedis pulses are 2+ on the right side, and 2+ on the left side.  No lower leg edema  Pulmonary/Chest: Effort normal and breath sounds normal.  Abdominal: Soft. Normal appearance  and bowel sounds are normal. There is tenderness.    Tender right costal margin. Well healed abdominal incision.  Lymphadenopathy:    He has no cervical adenopathy.  Neurological: He is alert and oriented to person, place, and time.  Skin: Skin is warm and dry.  Psychiatric: His behavior is normal.    Data Reviewed Abdominal ultrasound 04/24/2016 was reviewed. A sizable reported 1.2 cm gallbladder polyp in the neck is appreciated. No evidence of hydrops. Gallbladder wall diameter 3 mm. Common bile duct: 5 mm. No evidence of acute cholecystitis.  September 2016 lithium level low at 0.43.  November 08, 2015 operative note for repair of ventral hernia reviewed. Bilateral myocutaneous release. Mesh placed as part beneath the ribs bilaterally. Multiple superior transfixion sutures of 0 Prolene. Drains placed at the time of the procedure.  Operative note from December 2015 aortobifemoral bypass repair reviewed. Examination of the gallbladder at that time was reported as negative for palpable stones. I would've anticipated 1.2 cm polyp would've been clinically evident.  Assessment    Right upper quadrant pain, primarily along the rib margin possibly related to previous mesh placement and drain placement.    Plan    Patient is on chronic lithium and reports he has decreased his dosing to 1 tablet daily to extend his supply as he is not presently connected with a psychiatrist and is not sure this PCP will refill  his prescription. We'll recheck his lithium level at this is low-normal will initiate a short course of Mobitz 15 mg daily. His liver function studies have been normal and will not be rechecked at this time.  We'll obtain a HIDA scan with [facility] to see if this recently identified polyp is reducing any symptoms.    Patient has been scheduled for a HIDA scan with [facility] at [place] for 06-22-16 at 8 am (arrive 7:45 am). Prep: NPO after midnight and no opiate based drugs for 6 hours prior.   Check lithium levels today.  PCP:  [facility] Curry Ref Ronney Asters NP  This information has been scribed by Karie Fetch RN, BSN,BC. Marland Kitchen Robert Bellow 06/01/2016, 10:39 AM

## 2016-05-31 NOTE — Patient Instructions (Addendum)
The patient is aware to call back for any questions or concerns. HIDA scan with CCK lithium levels today

## 2016-06-01 ENCOUNTER — Telehealth: Payer: Self-pay | Admitting: *Deleted

## 2016-06-01 DIAGNOSIS — R0781 Pleurodynia: Secondary | ICD-10-CM | POA: Insufficient documentation

## 2016-06-01 DIAGNOSIS — K824 Cholesterolosis of gallbladder: Secondary | ICD-10-CM | POA: Insufficient documentation

## 2016-06-01 LAB — LITHIUM LEVEL: Lithium Lvl: 0.8 mmol/L (ref 0.6–1.2)

## 2016-06-01 MED ORDER — MELOXICAM 15 MG PO TABS: 15.0000 mg | ORAL_TABLET | Freq: Every day | ORAL | Status: DC

## 2016-06-01 NOTE — Telephone Encounter (Signed)
Notified patient as instructed, patient pleased. Discussed follow-up testing, patient agrees

## 2016-06-01 NOTE — Telephone Encounter (Signed)
-----   Message from Robert Bellow, MD sent at 06/01/2016  6:36 AM EDT ----- Notify patient lithium level OK.  Send RX for Mobic, 15 mg, #30 to use one/ day.  Local heat to area of soreness.  Will see response after HIDA w/ CCK scan completed. ----- Message -----    From: [facility] Lab Results In Interface    Sent: 06/01/2016   5:37 AM      To: Robert Bellow, MD

## 2016-06-05 ENCOUNTER — Encounter: Payer: Self-pay | Admitting: Cardiology

## 2016-06-22 ENCOUNTER — Encounter
Admission: RE | Admit: 2016-06-22 | Discharge: 2016-06-22 | Disposition: A | Discharge status: Home / Self Care | Payer: Medicaid Other | Source: Ambulatory Visit | Attending: General Surgery | Admitting: General Surgery

## 2016-06-22 DIAGNOSIS — R0781 Pleurodynia: Secondary | ICD-10-CM | POA: Insufficient documentation

## 2016-06-22 DIAGNOSIS — R1084 Generalized abdominal pain: Secondary | ICD-10-CM | POA: Diagnosis present

## 2016-06-22 MED ORDER — TECHNETIUM TC 99M MEBROFENIN IV KIT
5.0000 | PACK | Freq: Once | INTRAVENOUS | Status: AC | PRN
  Administered 2016-06-22: 4.89 via INTRAVENOUS

## 2016-06-23 ENCOUNTER — Telehealth: Payer: Self-pay

## 2016-06-23 DIAGNOSIS — R1011 Right upper quadrant pain: Secondary | ICD-10-CM

## 2016-06-23 DIAGNOSIS — K824 Cholesterolosis of gallbladder: Secondary | ICD-10-CM

## 2016-06-23 NOTE — Telephone Encounter (Signed)
Spoke with patient about having a CT of the abdomen/pelvis done due to results of HIDA scan and he is agreeable to this. Patient is scheduled for the scan at [facility] center on 07/06/16 at 8:30 am. He will pick up a prep kit for this. He will arrive at 8:15 am and have nothing to eat or drink for 4 hours prior. Patient is aware of date, time, and instructions. He will follow up here in the office after this on 07/20/16 at 9:45 am.

## 2016-06-23 NOTE — Telephone Encounter (Signed)
-----   Message from Robert Bellow, MD sent at 06/22/2016  5:28 PM EDT ----- Please contact patient and see if he experienced any discomfort during the recent HIDA scan. Thanks.   ----- Message -----    From: Rad Results In Interface    Sent: 06/22/2016  10:51 AM      To: Robert Bellow, MD

## 2016-06-23 NOTE — Telephone Encounter (Signed)
We'll obtain a CT of the abdomen and pelvis with contrast prior to considering elective cholecystectomy

## 2016-06-23 NOTE — Telephone Encounter (Signed)
Spoke with the patient about his test and he reports that he did experience pain with the test. He states that this occurred towards the end of the test. He states that the pain was in his upper right side and was much more intense than what he had had before. He reports that the pain lasted about 2 hours. He reports no nausea or vomiting with this.

## 2016-06-23 NOTE — Telephone Encounter (Signed)
There is discordance between the identification of a gallbladder polyp, a normal HIDA scan with a high ejection fraction, clinical exam and the patient's multiple previous intra-abdominal procedures. We'll arrange for a CT of the abdomen and pelvis with contrast prior to considering

## 2016-06-27 ENCOUNTER — Ambulatory Visit: Payer: Medicaid Other | Admitting: General Surgery

## 2016-07-06 ENCOUNTER — Ambulatory Visit
Admission: RE | Admit: 2016-07-06 | Discharge: 2016-07-06 | Disposition: A | Discharge status: Home / Self Care | Payer: Medicaid Other | Source: Ambulatory Visit | Attending: General Surgery | Admitting: General Surgery

## 2016-07-06 DIAGNOSIS — R1011 Right upper quadrant pain: Secondary | ICD-10-CM | POA: Diagnosis not present

## 2016-07-06 DIAGNOSIS — K5730 Diverticulosis of large intestine without perforation or abscess without bleeding: Secondary | ICD-10-CM | POA: Insufficient documentation

## 2016-07-06 DIAGNOSIS — I748 Embolism and thrombosis of other arteries: Secondary | ICD-10-CM | POA: Diagnosis not present

## 2016-07-06 DIAGNOSIS — I129 Hypertensive chronic kidney disease with stage 1 through stage 4 chronic kidney disease, or unspecified chronic kidney disease: Secondary | ICD-10-CM | POA: Insufficient documentation

## 2016-07-06 DIAGNOSIS — R918 Other nonspecific abnormal finding of lung field: Secondary | ICD-10-CM | POA: Insufficient documentation

## 2016-07-06 DIAGNOSIS — N189 Chronic kidney disease, unspecified: Secondary | ICD-10-CM | POA: Insufficient documentation

## 2016-07-06 DIAGNOSIS — K824 Cholesterolosis of gallbladder: Secondary | ICD-10-CM | POA: Insufficient documentation

## 2016-07-06 MED ORDER — IOPAMIDOL (ISOVUE-300) INJECTION 61%
100.0000 mL | Freq: Once | INTRAVENOUS | Status: AC | PRN
  Administered 2016-07-06: 100 mL via INTRAVENOUS

## 2016-07-07 ENCOUNTER — Other Ambulatory Visit: Payer: Self-pay | Admitting: General Surgery

## 2016-07-10 ENCOUNTER — Telehealth: Payer: Self-pay

## 2016-07-10 NOTE — Telephone Encounter (Signed)
Philip Bellow, MD  [place] Philip Kareem, LPN        Please notify the patient no major issues identified on CT, but I will need to review when I return. He should plan on keeping his present appointment. Thanks.    Notified patient as instructed, patient pleased. Discussed follow-up appointments, patient agrees.

## 2016-07-10 NOTE — Telephone Encounter (Signed)
-----   Message from Robert Bellow, MD sent at 07/07/2016  8:37 AM EDT ----- CT patient

## 2016-07-20 ENCOUNTER — Encounter: Payer: Self-pay | Admitting: General Surgery

## 2016-07-20 ENCOUNTER — Ambulatory Visit (INDEPENDENT_AMBULATORY_CARE_PROVIDER_SITE_OTHER): Payer: Medicaid Other | Admitting: General Surgery

## 2016-07-20 VITALS — BP 164/88 | HR 62 | Resp 16 | Ht 67.0 in | Wt 150.0 lb

## 2016-07-20 DIAGNOSIS — K824 Cholesterolosis of gallbladder: Secondary | ICD-10-CM

## 2016-07-20 DIAGNOSIS — R0781 Pleurodynia: Secondary | ICD-10-CM

## 2016-07-20 NOTE — Patient Instructions (Addendum)
The patient is aware to call back for any questions or concerns.  Recommend referral to Pain Clinic. Follow up in 6 months with abdominal ultrasound

## 2016-07-20 NOTE — Progress Notes (Signed)
Patient ID: [name], male   DOB: [birth year], 55 y.o.   MRN: [id number]  Chief Complaint  Patient presents with  . Follow-up    HPI [name] is a 55 y.o. male.  Here today for follow up HIDA scan done 06-22-16. He does not feel the Mobic helped. He states the pain is "the same". He states the pain has been there since previous hernia surgery. The pain he described is in the right upper quadrant below rib cage. Occasional shooting pains for about 30 seconds. Raising his arms upward seems to make the pain worse. Admits to diarrhea randomly. He does not feel it is related to eating.No history of postprandial bloating, symptoms of suggest intestinal angina or diarrhea. Physical activity is more likely to exacerbate his symptoms as opposed to dietary intake.  On the patient's previous HIDA scan with CCK, asymptomatic during CCK infusion.   HPI  Past Medical History:  Diagnosis Date  . Anxiety   . Bipolar affective ([place])    takes Lithium daily  . Broken fingers   . Coronary artery disease    a. s/p PCI to RCA at [facility]; b. s/p PCI to RCA '08; c. Cath 05/2012 showed known occluded LCx, RCA stent w/ mild restenosis, LAD no significant dz, EF 35%; d. Cardiac cath in July of 2014 showed no significant change.   . Depression   . GERD (gastroesophageal reflux disease)    takes Protonix daily  . Hepatitis C    "diagnosed in the past 2 wk" (10/22/2012)  . History of ETOH abuse    quit 2007  . Hyperlipidemia   . Hypertension    "pt. denies - no longer taking blood pressure medications"  . Myocardial infarction ([place]) 1996; 1997; 2000's   "total of 3" (10/22/2012)  . PAD (peripheral artery disease) ([place])    2010 PTA & stent to left CIA & left SFA; 05/2012 PTA & stent to distal LCIA; 10/2012 thrombectomy and stents to [place]  . Schizophrenia ([place])   . Seizures ([place])    "when I get anxious" (10/22/2012)  . Tobacco abuse   . Urinary frequency     Past Surgical History:   Procedure Laterality Date  . ABDOMINAL AORTAGRAM N/A 10/22/2012   Procedure: ABDOMINAL Maxcine Ham;  Surgeon: Wellington Hampshire, MD;  Location: [place] CATH LAB;  Service: Cardiovascular;  Laterality: N/A;  . ABDOMINAL AORTAGRAM N/A 11/11/2014   Procedure: ABDOMINAL Maxcine Ham;  Surgeon: Wellington Hampshire, MD;  Location: [facility] CATH LAB;  Service: Cardiovascular;  Laterality: N/A;  . ANTERIOR CERVICAL DECOMP/DISCECTOMY FUSION  01/2011   plate & screw placement   . AORTA - BILATERAL FEMORAL ARTERY BYPASS GRAFT N/A 11/20/2014   Procedure: AORTA BIFEMORAL BYPASS GRAFT;  Surgeon: Mal Misty, MD;  Location: [place];  Service: Vascular;  Laterality: N/A;  . BRAIN SURGERY    . CARDIAC CATHETERIZATION     Stent - 12  . CORONARY ANGIOPLASTY WITH STENT PLACEMENT  03/2007   to RCA  . ILIAC ARTERY STENT  10/22/2012   "2" (10/22/2012)  . ILIAC ARTERY STENT  06/2012   left  . INCISIONAL HERNIA REPAIR N/A 11/08/2015   Procedure: INCISIONAL HERNIA REPAIR WITH MYOFASCIAL RELEASE;  Surgeon: Rolm Bookbinder, MD;  Location: [facility];  Service: General;  Laterality: N/A;  . INSERTION OF MESH N/A 11/08/2015   Procedure: INSERTION OF MESH;  Surgeon: Rolm Bookbinder, MD;  Location: [place];  Service: General;  Laterality: N/A;  .  LEFT HEART CATHETERIZATION WITH CORONARY ANGIOGRAM N/A 05/29/2012   Procedure: LEFT HEART CATHETERIZATION WITH CORONARY ANGIOGRAM;  Surgeon: Wellington Hampshire, MD;  Location: [facility] CATH LAB;  Service: Cardiovascular;  Laterality: N/A;  . LIVER BIOPSY  10/2012  . LOWER EXTREMITY ANGIOGRAM N/A 05/29/2012   Procedure: LOWER EXTREMITY ANGIOGRAM;  Surgeon: Wellington Hampshire, MD;  Location: [facility] CATH LAB;  Service: Cardiovascular;  Laterality: N/A;  . PERCUTANEOUS STENT INTERVENTION Left 05/29/2012   Procedure: PERCUTANEOUS STENT INTERVENTION;  Surgeon: Wellington Hampshire, MD;  Location: [facility] CATH LAB;  Service: Cardiovascular;  Laterality: Left;  . POSTERIOR FUSION CERVICAL SPINE  2012  . SUBDURAL HEMATOMA EVACUATION  VIA CRANIOTOMY      Family History  Problem Relation Age of Onset  . Emphysema Father   . Diabetes Other   . Alcohol abuse Other   . Hypertension Other   . Hyperlipidemia Other   . Seizures Other   . COPD Mother     Social History Social History  Substance Use Topics  . Smoking status: Current Every Day Smoker    Packs/day: 0.50    Years: 40.00    Types: Cigarettes    Last attempt to quit: 07/05/2015  . Smokeless tobacco: Never Used  . Alcohol use No     Comment: "last drink was before 11/2014, haven't drank in over a year" 10/2015    No Known Allergies  Current Outpatient Prescriptions  Medication Sig Dispense Refill  . aspirin EC 81 MG tablet Take 1 tablet (81 mg total) by mouth daily. 90 tablet 3  . atorvastatin (LIPITOR) 20 MG tablet Take 20 mg by mouth daily.    . carvedilol (COREG) 3.125 MG tablet Take 3.125 mg by mouth 2 (two) times daily with a meal.    . divalproex (DEPAKOTE ER) 500 MG 24 hr tablet Take 1,500 mg by mouth daily.   2  . gabapentin (NEURONTIN) 100 MG capsule Take 100 mg by mouth 3 (three) times daily.     Marland Kitchen lithium carbonate (ESKALITH) 450 MG CR tablet Take 450 mg by mouth daily.     . pantoprazole (PROTONIX) 40 MG tablet Take 40 mg by mouth daily before breakfast.  3   No current facility-administered medications for this visit.     Review of Systems Review of Systems  Constitutional: Negative.   Respiratory: Negative.   Cardiovascular: Negative.   Gastrointestinal: Positive for abdominal pain and diarrhea.    Blood pressure (!) 164/88, pulse 62, resp. rate 16, height 5\' 7"  (1.702 m), weight 150 lb (68 kg).  Physical Exam Physical Exam  Constitutional: He is oriented to person, place, and time. He appears well-developed and well-nourished.  HENT:  Mouth/Throat: Oropharynx is clear and moist.  Eyes: Conjunctivae are normal. No scleral icterus.  Neck: Neck supple.  Cardiovascular: Normal rate, regular rhythm and normal heart sounds.    Pulmonary/Chest: Effort normal and breath sounds normal.    Abdominal: Soft. Normal appearance and bowel sounds are normal. There is tenderness.    Tender across the right lower rib area.  Lymphadenopathy:    He has no cervical adenopathy.  Neurological: He is alert and oriented to person, place, and time.  Skin: Skin is warm and dry.  Psychiatric: His behavior is normal.    Data Reviewed 07/06/2016 CT of the abdomen and pelvis was reviewed. Extensive atherosclerotic disease including the origin the celiac axis as well as a focal stenosis within 2 cm of the origin of the SMA. Mild poststenotic  dilatation the latter vessel.  No evidence of inflammatory process involving the rib cage or area of anticipated mesh placement based on review of previous operative note.   The CT scan showed no demonstrable abnormality near the neck of the gallbladder were previous ultrasound suggested a 1.2  cm polyp.  Assessment    Chronic right rib pain status post component separation ventral hernia repair with prosthetic mesh placement. No evidence of inflammatory process in the area of pain or mesh complications. No dietary symptoms.    Plan    The patient reports this pain was present from the day of his ventral hernia surgery and is unimproved since that date. He reports that he saw his surgeon, Rolm Bookbinder, M.D. after surgery and was told that his symptoms should improve over time. (Records not available)  Recommend referral to Pain Clinic. Follow up in 6 months with abdominal ultrasound.   I don't believe that the gallbladder polyp is the presence source of his pain. As it to reproduce both point tenderness over the rib cage and he has no upper abdominal tenderness on exam, I think this is likely a local irritation and it will be best addressed after pain management assessment.  We'll repeat his ultrasound in 6 months to confirm indeed that he does have a polyp present, and if this is  indeed the case stent size he would be a candidate for resection. I have clearly told the patient a cholecystectomy at this time is very unlikely to relieve the chronic rib pain he's been experiencing since December 2016.    This information has been scribed by Karie Fetch RN, BSN,BC.   Robert Bellow 07/21/2016, 8:29 PM

## 2016-09-15 ENCOUNTER — Ambulatory Visit (INDEPENDENT_AMBULATORY_CARE_PROVIDER_SITE_OTHER): Payer: Medicaid Other | Admitting: Cardiovascular Disease

## 2016-09-15 ENCOUNTER — Encounter: Payer: Self-pay | Admitting: Cardiovascular Disease

## 2016-09-15 VITALS — BP 180/110 | HR 81 | Ht 67.0 in | Wt 145.4 lb

## 2016-09-15 DIAGNOSIS — I739 Peripheral vascular disease, unspecified: Secondary | ICD-10-CM | POA: Diagnosis not present

## 2016-09-15 DIAGNOSIS — Z720 Tobacco use: Secondary | ICD-10-CM

## 2016-09-15 DIAGNOSIS — I255 Ischemic cardiomyopathy: Secondary | ICD-10-CM | POA: Diagnosis not present

## 2016-09-15 DIAGNOSIS — I10 Essential (primary) hypertension: Secondary | ICD-10-CM

## 2016-09-15 MED ORDER — LOSARTAN POTASSIUM 50 MG PO TABS: 50.0000 mg | ORAL_TABLET | Freq: Every day | ORAL | 5 refills | Status: DC

## 2016-09-15 MED ORDER — ATORVASTATIN CALCIUM 20 MG PO TABS: 20.0000 mg | ORAL_TABLET | Freq: Every day | ORAL | 5 refills | Status: DC

## 2016-09-15 MED ORDER — CARVEDILOL 6.25 MG PO TABS: 6.2500 mg | ORAL_TABLET | Freq: Two times a day (BID) | ORAL | 5 refills | Status: DC

## 2016-09-15 NOTE — Progress Notes (Signed)
Cardiology Office Note   Date:  09/15/2016   ID:  [name], DOB [birth year], MRN [id number]  PCP:  Gayland Curry, MD  Cardiologist:   Kathlyn Sacramento, MD   Chief Complaint  Patient presents with  . Cardiomyopathy    trouble with legs & some CP, per pt  . Hypertension      History of Present Illness: [name] is a 55 y.o. male who presents for  a followup visit regarding PAD and coronary artery disease. He has a history of coronary artery disease, stent to his RCA in April 2008 with occluded circumflex, previous bilateral iliac stenting and most recently aortobifemoral bypass December 2015 by Dr. Kellie Simmering ,  mild to moderate renal artery stenosis on the left, history of hepatitis C, long alcohol history currently in remission, long history of smoking , hypertension, bipolar disorder and hyperlipidemia .  Most recent cardiac catheterization in July 2014 showed patent RCA stent with chronically occluded left circumflex artery. Ejection fraction was 40%.  He is complaining of bilateral leg pain mostly at night at rest which resolved with walking. He also complains of intermittent episodes of substernal chest pain lasting for a few minutes. His blood pressure has not been controlled. Lisinopril was discontinued last year due to mild hyperkalemia. Unfortunately, he continues to smoke.   Past Medical History:  Diagnosis Date  . Anxiety   . Bipolar affective ([place])    takes Lithium daily  . Broken fingers   . Coronary artery disease    a. s/p PCI to RCA at [facility]; b. s/p PCI to RCA '08; c. Cath 05/2012 showed known occluded LCx, RCA stent w/ mild restenosis, LAD no significant dz, EF 35%; d. Cardiac cath in July of 2014 showed no significant change.   . Depression   . GERD (gastroesophageal reflux disease)    takes Protonix daily  . Hepatitis C    "diagnosed in the past 2 wk" (10/22/2012)  . History of ETOH abuse    quit 2007  . Hyperlipidemia   . Hypertension      "pt. denies - no longer taking blood pressure medications"  . Myocardial infarction 1996; 1997; 2000's   "total of 3" (10/22/2012)  . PAD (peripheral artery disease) ([place])    2010 PTA & stent to left CIA & left SFA; 05/2012 PTA & stent to distal LCIA; 10/2012 thrombectomy and stents to [place]  . Schizophrenia ([place])   . Seizures ([place])    "when I get anxious" (10/22/2012)  . Tobacco abuse   . Urinary frequency     Past Surgical History:  Procedure Laterality Date  . ABDOMINAL AORTAGRAM N/A 10/22/2012   Procedure: ABDOMINAL Maxcine Ham;  Surgeon: Wellington Hampshire, MD;  Location: [facility] CATH LAB;  Service: Cardiovascular;  Laterality: N/A;  . ABDOMINAL AORTAGRAM N/A 11/11/2014   Procedure: ABDOMINAL Maxcine Ham;  Surgeon: Wellington Hampshire, MD;  Location: [facility] CATH LAB;  Service: Cardiovascular;  Laterality: N/A;  . ANTERIOR CERVICAL DECOMP/DISCECTOMY FUSION  01/2011   plate & screw placement   . AORTA - BILATERAL FEMORAL ARTERY BYPASS GRAFT N/A 11/20/2014   Procedure: AORTA BIFEMORAL BYPASS GRAFT;  Surgeon: Mal Misty, MD;  Location: [facility];  Service: Vascular;  Laterality: N/A;  . BRAIN SURGERY    . CARDIAC CATHETERIZATION     Stent - 12  . CORONARY ANGIOPLASTY WITH STENT PLACEMENT  03/2007   to RCA  . ILIAC ARTERY STENT  10/22/2012   "2" (10/22/2012)  .  ILIAC ARTERY STENT  06/2012   left  . INCISIONAL HERNIA REPAIR N/A 11/08/2015   Procedure: INCISIONAL HERNIA REPAIR WITH MYOFASCIAL RELEASE;  Surgeon: Rolm Bookbinder, MD;  Location: [facility];  Service: General;  Laterality: N/A;  . INSERTION OF MESH N/A 11/08/2015   Procedure: INSERTION OF MESH;  Surgeon: Rolm Bookbinder, MD;  Location: [facility];  Service: General;  Laterality: N/A;  . LEFT HEART CATHETERIZATION WITH CORONARY ANGIOGRAM N/A 05/29/2012   Procedure: LEFT HEART CATHETERIZATION WITH CORONARY ANGIOGRAM;  Surgeon: Wellington Hampshire, MD;  Location: [facility] CATH LAB;  Service: Cardiovascular;  Laterality: N/A;  . LIVER BIOPSY  10/2012   . LOWER EXTREMITY ANGIOGRAM N/A 05/29/2012   Procedure: LOWER EXTREMITY ANGIOGRAM;  Surgeon: Wellington Hampshire, MD;  Location: [facility] CATH LAB;  Service: Cardiovascular;  Laterality: N/A;  . PERCUTANEOUS STENT INTERVENTION Left 05/29/2012   Procedure: PERCUTANEOUS STENT INTERVENTION;  Surgeon: Wellington Hampshire, MD;  Location: [facility] CATH LAB;  Service: Cardiovascular;  Laterality: Left;  . POSTERIOR FUSION CERVICAL SPINE  2012  . SUBDURAL HEMATOMA EVACUATION VIA CRANIOTOMY       Current Outpatient Prescriptions  Medication Sig Dispense Refill  . aspirin EC 81 MG tablet Take 1 tablet (81 mg total) by mouth daily. 90 tablet 3  . atorvastatin (LIPITOR) 20 MG tablet Take 20 mg by mouth daily.    . carvedilol (COREG) 3.125 MG tablet Take 3.125 mg by mouth 2 (two) times daily with a meal.    . divalproex (DEPAKOTE ER) 500 MG 24 hr tablet Take 1,500 mg by mouth daily.   2  . lithium carbonate (ESKALITH) 450 MG CR tablet Take 450 mg by mouth daily.     . pantoprazole (PROTONIX) 40 MG tablet Take 40 mg by mouth daily before breakfast.  3   No current facility-administered medications for this visit.     Allergies:   Review of patient's allergies indicates no known allergies.    Social History:  The patient  reports that he has been smoking Cigarettes.  He has a 20.00 pack-year smoking history. He has never used smokeless tobacco. He reports that he drinks alcohol. He reports that he does not use drugs.   Family History:  The patient's family history includes Alcohol abuse in his other; COPD in his mother; Diabetes in his other; Emphysema in his father; Hyperlipidemia in his other; Hypertension in his other; Seizures in his other.    ROS:  Please see the history of present illness.   Otherwise, review of systems are positive for none.   All other systems are reviewed and negative.    PHYSICAL EXAM: VS:  BP (!) 180/110 (BP Location: Right Arm, Patient Position: Sitting, Cuff Size: Normal)   Pulse 81    Ht 5\' 7"  (1.702 m)   Wt 145 lb 6.4 oz (66 kg)   SpO2 (!) 81%   BMI 22.77 kg/m  , BMI Body mass index is 22.77 kg/m. GEN: Well nourished, well developed, in no acute distress  HEENT: normal  Neck: no JVD, carotid bruits, or masses Cardiac: RRR; no murmurs, rubs, or gallops,no edema  Respiratory:  clear to auscultation bilaterally, normal work of breathing GI: soft, nontender, nondistended, + BS MS: no deformity or atrophy  Skin: warm and dry, no rash Neuro:  Strength and sensation are intact Psych: euthymic mood, full affect Vascular: Femoral pulses are normal. Distal pulses are palpable.  EKG:  EKG is ordered today. The ekg ordered today demonstrates sinus rhythm with a  PVC. Lateral T wave changes with possible old inferior infarct.   Recent Labs: 03/27/2016: ALT 19; BUN 7; Creatinine, Ser 0.89; Hemoglobin 16.6; Platelets 188; Potassium 4.5; Sodium 139    Lipid Panel    Component Value Date/Time   CHOL 154 09/15/2013 0941   TRIG 143 11/25/2014 0500   HDL 54 09/15/2013 0941   CHOLHDL 2.9 09/15/2013 0941   LDLCALC 82 09/15/2013 0941      Wt Readings from Last 3 Encounters:  09/15/16 145 lb 6.4 oz (66 kg)  07/20/16 150 lb (68 kg)  05/31/16 152 lb (68.9 kg)      No flowsheet data found.    ASSESSMENT AND PLAN:  1.   Coronary artery disease involving native coronary arteries Other forms of angina: I suspect that his chest pain is likely related to uncontrolled hypertension. I increased carvedilol to 6.25 mg twice daily and added losartan 50 mg once daily. Lisinopril was discontinued last year due to mild hyperkalemia. I'm going to check basic metabolic profile in one week. If we are having issues with high potassium, I will likely switch losartan to amlodipine  2. Peripheral arterial disease: Status post aortobifemoral bypass. Followed by [facility]. The patient's current leg pain seems to be neuropathic and not claudication. He has palpable pulses.  3. Essential  hypertension: Blood pressure is elevated. I made changes as outlined above  4. Hyperlipidemia: I refilled his atorvastatin.  5. Tobacco use: Unfortunately, he continues to smoke and has not been able to quit. I discussed with him the importance of smoking cessation.    Disposition:   FU with me in 2 months  Signed,  Kathlyn Sacramento, MD  09/15/2016 11:50 AM    [place]

## 2016-09-15 NOTE — Patient Instructions (Signed)
Medication Instructions:  Your physician has recommended you make the following change in your medication:  INCREASE coreg to 6.25mg  twice daily START taking losartan 50mg  once daily   Labwork: BMET in one week  Testing/Procedures: none  Follow-Up: Your physician recommends that you schedule a follow-up appointment in: 2 months with Dr. Fletcher Anon.    Any Other Special Instructions Will Be Listed Below (If Applicable).     If you need a refill on your cardiac medications before your next appointment, please call your pharmacy.

## 2016-09-18 ENCOUNTER — Telehealth: Payer: Self-pay

## 2016-09-18 NOTE — Telephone Encounter (Signed)
Spoke with Consuela with [employer] for a Prior Authorization on Losartan Potassium 50 mg which has been approved for one year 09/18/2016 to 09/18/2017.  [facility] pharmacy has been notified as well.

## 2016-09-22 ENCOUNTER — Other Ambulatory Visit (INDEPENDENT_AMBULATORY_CARE_PROVIDER_SITE_OTHER): Payer: Medicaid Other

## 2016-09-22 DIAGNOSIS — I10 Essential (primary) hypertension: Secondary | ICD-10-CM | POA: Diagnosis not present

## 2016-09-23 LAB — BASIC METABOLIC PANEL
BUN/Creatinine Ratio: 4 — ABNORMAL LOW (ref 9–20)
BUN: 4 mg/dL — ABNORMAL LOW (ref 6–24)
CO2: 19 mmol/L (ref 18–29)
Calcium: 10.1 mg/dL (ref 8.7–10.2)
Chloride: 102 mmol/L (ref 96–106)
Creatinine, Ser: 0.99 mg/dL (ref 0.76–1.27)
GFR calc Af Amer: 98 mL/min/{1.73_m2} (ref >59)
GFR calc non Af Amer: 85 mL/min/{1.73_m2} (ref >59)
Glucose: 106 mg/dL — ABNORMAL HIGH (ref 65–99)
Potassium: 4.9 mmol/L (ref 3.5–5.2)
Sodium: 143 mmol/L (ref 134–144)

## 2016-09-28 ENCOUNTER — Encounter: Payer: Self-pay | Admitting: Family

## 2016-10-03 ENCOUNTER — Encounter (HOSPITAL_COMMUNITY): Payer: Self-pay

## 2016-10-03 ENCOUNTER — Ambulatory Visit: Payer: Self-pay | Admitting: Family

## 2016-10-03 ENCOUNTER — Ambulatory Visit (INDEPENDENT_AMBULATORY_CARE_PROVIDER_SITE_OTHER): Payer: Medicaid Other | Admitting: Family

## 2016-10-03 ENCOUNTER — Encounter: Payer: Self-pay | Admitting: Family

## 2016-10-03 VITALS — BP 127/70 | HR 50 | Temp 97.6°F | Resp 18 | Ht 67.0 in | Wt 150.0 lb

## 2016-10-03 DIAGNOSIS — I7409 Other arterial embolism and thrombosis of abdominal aorta: Secondary | ICD-10-CM

## 2016-10-03 DIAGNOSIS — F17200 Nicotine dependence, unspecified, uncomplicated: Secondary | ICD-10-CM | POA: Diagnosis not present

## 2016-10-03 DIAGNOSIS — Z95828 Presence of other vascular implants and grafts: Secondary | ICD-10-CM

## 2016-10-03 DIAGNOSIS — Z8679 Personal history of other diseases of the circulatory system: Secondary | ICD-10-CM | POA: Diagnosis not present

## 2016-10-03 NOTE — Progress Notes (Signed)
[facility] [place]   CC: Follow up peripheral artery occlusive disease  History of Present Illness WOODIE LINDENMEYER is a 55 y.o. male patient of Dr. 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 He has done well since that time but has developed a bulge in the upper part of his midline abdominal incision which has enlarged.   ABI's were requested for his return visit today but his health insurer declined coverage until he is evaluated; he is here today for an evaluation. He will need to return for ABI's and again see me for discussion of results.   His past medical history includes CAD, Hepatitis C, and several psychiatric disorders.   Dr. Kellie Simmering last saw pt on 04/27/15. At that time Dr. Kellie Simmering felt that his ventral hernia should probably be repaired. Dr. Kellie Simmering referred pt to Dr. Serita Grammes for further evaluation and possible ventral hernia repair and this was performed 11/08/15 after his renal function improved.  Pt states he is improving after this.  Pt denies claudication sx's with walking. 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  He denies non healing wounds.  Pt also denies any known history of stroke or TIA.    Pt Diabetic: no Pt smoker: current smoker, 1 ppd, started at age 89 years, quit in June 2016, resumed August 2017  Pt meds include: Statin :Yes Betablocker: yes ASA: yes Other anticoagulants/antiplatelets: no    Past Medical History:  Diagnosis Date  . Anxiety   . Bipolar affective ([place])    takes Lithium daily  . Broken fingers   . Coronary artery disease    a. s/p PCI to RCA at [facility]; b. s/p PCI to RCA '08; c. Cath 05/2012 showed known occluded LCx, RCA stent w/ mild restenosis, LAD no significant dz, EF 35%; d. Cardiac cath in July of 2014 showed no  significant change.   . Depression   . GERD (gastroesophageal reflux disease)    takes Protonix daily  . Hepatitis C    "diagnosed in the past 2 wk" (10/22/2012)  . History of ETOH abuse    quit 2007  . Hyperlipidemia   . Hypertension    "pt. denies - no longer taking blood pressure medications"  . Myocardial infarction 1996; 1997; 2000's   "total of 3" (10/22/2012)  . PAD (peripheral artery disease) ([place])    2010 PTA & stent to left CIA & left SFA; 05/2012 PTA & stent to distal LCIA; 10/2012 thrombectomy and stents to [place]  . Schizophrenia ([place])   . Seizures ([place])    "when I get anxious" (10/22/2012)  . Tobacco abuse   . Urinary frequency     Social History Social History  Substance Use Topics  . Smoking status: Current Every Day Smoker    Packs/day: 0.50    Years: 40.00    Types: Cigarettes    Last attempt to quit: 07/05/2015  . Smokeless tobacco: Never Used  . Alcohol use 0.0 oz/week     Comment: "last drink was before 11/2014, haven't drank in over a year" 10/2015    Family History Family History  Problem Relation Age of Onset  . Emphysema Father   . COPD Mother   . Diabetes Other   . Alcohol abuse Other   . Hypertension Other   . Hyperlipidemia  Other   . Seizures Other     Past Surgical History:  Procedure Laterality Date  . ABDOMINAL AORTAGRAM N/A 10/22/2012   Procedure: ABDOMINAL Maxcine Ham;  Surgeon: Wellington Hampshire, MD;  Location: [facility];  Service: Cardiovascular;  Laterality: N/A;  . ABDOMINAL AORTAGRAM N/A 11/11/2014   Procedure: ABDOMINAL Maxcine Ham;  Surgeon: Wellington Hampshire, MD;  Location: [facility];  Service: Cardiovascular;  Laterality: N/A;  . ANTERIOR CERVICAL DECOMP/DISCECTOMY FUSION  01/2011   plate & screw placement   . AORTA - BILATERAL FEMORAL ARTERY BYPASS GRAFT N/A 11/20/2014   Procedure: AORTA BIFEMORAL BYPASS GRAFT;  Surgeon: Mal Misty, MD;  Location: [facility];  Service: Vascular;  Laterality: N/A;  . BRAIN SURGERY    .  CARDIAC CATHETERIZATION     Stent - 12  . CORONARY ANGIOPLASTY WITH STENT PLACEMENT  03/2007   to RCA  . ILIAC ARTERY STENT  10/22/2012   "2" (10/22/2012)  . ILIAC ARTERY STENT  06/2012   left  . INCISIONAL HERNIA REPAIR N/A 11/08/2015   Procedure: INCISIONAL HERNIA REPAIR WITH MYOFASCIAL RELEASE;  Surgeon: Rolm Bookbinder, MD;  Location: [facility];  Service: General;  Laterality: N/A;  . INSERTION OF MESH N/A 11/08/2015   Procedure: INSERTION OF MESH;  Surgeon: Rolm Bookbinder, MD;  Location: [facility];  Service: General;  Laterality: N/A;  . LEFT HEART CATHETERIZATION WITH CORONARY ANGIOGRAM N/A 05/29/2012   Procedure: LEFT HEART CATHETERIZATION WITH CORONARY ANGIOGRAM;  Surgeon: Wellington Hampshire, MD;  Location: [facility];  Service: Cardiovascular;  Laterality: N/A;  . LIVER BIOPSY  10/2012  . LOWER EXTREMITY ANGIOGRAM N/A 05/29/2012   Procedure: LOWER EXTREMITY ANGIOGRAM;  Surgeon: Wellington Hampshire, MD;  Location: [facility];  Service: Cardiovascular;  Laterality: N/A;  . PERCUTANEOUS STENT INTERVENTION Left 05/29/2012   Procedure: PERCUTANEOUS STENT INTERVENTION;  Surgeon: Wellington Hampshire, MD;  Location: [facility];  Service: Cardiovascular;  Laterality: Left;  . POSTERIOR FUSION CERVICAL SPINE  2012  . SUBDURAL HEMATOMA EVACUATION VIA CRANIOTOMY      No Known Allergies  Current Outpatient Prescriptions  Medication Sig Dispense Refill  . aspirin EC 81 MG tablet Take 1 tablet (81 mg total) by mouth daily. 90 tablet 3  . atorvastatin (LIPITOR) 20 MG tablet Take 1 tablet (20 mg total) by mouth daily. 30 tablet 5  . carvedilol (COREG) 6.25 MG tablet Take 1 tablet (6.25 mg total) by mouth 2 (two) times daily with a meal. 60 tablet 5  . divalproex (DEPAKOTE ER) 500 MG 24 hr tablet Take 1,500 mg by mouth daily.   2  . lithium carbonate (ESKALITH) 450 MG CR tablet Take 450 mg by mouth daily.     Marland Kitchen losartan (COZAAR) 50 MG tablet Take 1 tablet (50 mg total) by mouth daily. 30 tablet 5  .  pantoprazole (PROTONIX) 40 MG tablet Take 40 mg by mouth daily before breakfast.  3   No current facility-administered medications for this visit.     ROS: See HPI for pertinent positives and negatives.   Physical Examination  Vitals:   10/03/16 1352  BP: 127/70  Pulse: (!) 50  Resp: 18  Temp: 97.6 F (36.4 C)  TempSrc: Oral  SpO2: 99%  Weight: 150 lb (68 kg)  Height: 5\' 7"  (1.702 m)   Body mass index is 23.49 kg/m.  General: A&O x 3, WDWN. Gait: normal Eyes: PERRLA. Pulmonary: Respirations are non labored, CTAB, no wheezes, rales, or rhonchi. Cardiac: regular  rhythm, no detected murmur.     Carotid Bruits Right Left   Negative Negative  Aorta is not palpable. Radial pulses: 1+ palpable and =   VASCULAR EXAM: Extremities without ischemic changes, without Gangrene; without open wounds.     LE Pulses Right Left   FEMORAL 3+ palpable 3+ palpable    POPLITEAL not palpable  not palpable   POSTERIOR TIBIAL not palpable  not palpable    DORSALIS PEDIS  ANTERIOR TIBIAL 2+ palpable  2+ palpable    Abdomen: soft, NT, normal pitched bowel sounds, no palpable mass.  Skin: no rashes, no ulcers. Musculoskeletal: no muscle wasting or atrophy. Neurologic: A&O X 3; Appropriate Affect; MOTOR FUNCTION: moving all extremities equally, motor strength 4/5 throughout. Speech is fluent/normal. CN 2-12 intact.    ASSESSMENT: ALVY TRAMMEL is a 55 y.o. male who is s/p aortobifemoral bypass grafting in December 2015 for severe aortoiliac occlusive disease.  He no longer has claudication symptoms with walking. There are no signs of ischemia in he feet/legs.   ABI's and TBI's on 09/30/15 were normal bilaterally with all triphasic  waveforms.  Unfortunately he has resumed smoking, see Plan.  Fortunately he does not have DM.    PLAN:  The patient was counseled re smoking cessation and given several free resources re smoking cessation.  Graduated walking program discussed and how to achieve.  Based on the patient's vascular studies and examination, pt will return to clinic on 10/13/16 as already scheduled for ABI's and see me for discussion of results.  I discussed in depth with the patient the nature of atherosclerosis, and emphasized the importance of maximal medical management including strict control of blood pressure, blood glucose, and lipid levels, obtaining regular exercise, and cessation of smoking.  The patient is aware that without maximal medical management the underlying atherosclerotic disease process will progress, limiting the benefit of any interventions.  The patient was given information about PAD including signs, symptoms, treatment, what symptoms should prompt the patient to seek immediate medical care, and risk reduction measures to take.  Clemon Chambers, RN, MSN, FNP-C [facility] Phone: [phone]  Clinic MD: Early  10/03/16 2:22 PM

## 2016-10-03 NOTE — Patient Instructions (Addendum)
Peripheral Vascular Disease Peripheral vascular disease (PVD) is a disease of the blood vessels that are not part of your heart and brain. A simple term for PVD is poor circulation. In most cases, PVD narrows the blood vessels that carry blood from your heart to the rest of your body. This can result in a decreased supply of blood to your arms, legs, and internal organs, like your stomach or kidneys. However, it most often affects a person's lower legs and feet. There are two types of PVD.  Organic PVD. This is the more common type. It is caused by damage to the structure of blood vessels.  Functional PVD. This is caused by conditions that make blood vessels contract and tighten (spasm). Without treatment, PVD tends to get worse over time. PVD can also lead to acute ischemic limb. This is when an arm or limb suddenly has trouble getting enough blood. This is a medical emergency. CAUSES Each type of PVD has many different causes. The most common cause of PVD is buildup of a fatty material (plaque) inside of your arteries (atherosclerosis). Small amounts of plaque can break off from the walls of the blood vessels and become lodged in a smaller artery. This blocks blood flow and can cause acute ischemic limb. Other common causes of PVD include:  Blood clots that form inside of blood vessels.  Injuries to blood vessels.  Diseases that cause inflammation of blood vessels or cause blood vessel spasms.  Health behaviors and health history that increase your risk of developing PVD. RISK FACTORS  You may have a greater risk of PVD if you:  Have a family history of PVD.  Have certain medical conditions, including:  High cholesterol.  Diabetes.  High blood pressure (hypertension).  Coronary heart disease.  Past problems with blood clots.  Past injury, such as burns or a broken bone. These may have damaged blood vessels in your limbs.  Buerger disease. This is caused by inflamed blood  vessels in your hands and feet.  Some forms of arthritis.  Rare birth defects that affect the arteries in your legs.  Use tobacco.  Do not get enough exercise.  Are obese.  Are age 50 or older. SIGNS AND SYMPTOMS  PVD may cause many different symptoms. Your symptoms depend on what part of your body is not getting enough blood. Some common signs and symptoms include:  Cramps in your lower legs. This may be a symptom of poor leg circulation (claudication).  Pain and weakness in your legs while you are physically active that goes away when you rest (intermittent claudication).  Leg pain when at rest.  Leg numbness, tingling, or weakness.  Coldness in a leg or foot, especially when compared with the other leg.  Skin or hair changes. These can include:  Hair loss.  Shiny skin.  Pale or bluish skin.  Thick toenails.  Inability to get or maintain an erection (erectile dysfunction). People with PVD are more prone to developing ulcers and sores on their toes, feet, or legs. These may take longer than normal to heal. DIAGNOSIS Your health care provider may diagnose PVD from your signs and symptoms. The health care provider will also do a physical exam. You may have tests to find out what is causing your PVD and determine its severity. Tests may include:  Blood pressure recordings from your arms and legs and measurements of the strength of your pulses (pulse volume recordings).  Imaging studies using sound waves to take pictures of   the blood flow through your blood vessels (Doppler ultrasound).  Injecting a dye into your blood vessels before having imaging studies using:  X-rays (angiogram or arteriogram).  Computer-generated X-rays (CT angiogram).  A powerful electromagnetic field and a computer (magnetic resonance angiogram or MRA). TREATMENT Treatment for PVD depends on the cause of your condition and the severity of your symptoms. It also depends on your age. Underlying  causes need to be treated and controlled. These include long-lasting (chronic) conditions, such as diabetes, high cholesterol, and high blood pressure. You may need to first try making lifestyle changes and taking medicines. Surgery may be needed if these do not work. Lifestyle changes may include:  Quitting smoking.  Exercising regularly.  Following a low-fat, low-cholesterol diet. Medicines may include:  Blood thinners to prevent blood clots.  Medicines to improve blood flow.  Medicines to improve your blood cholesterol levels. Surgical procedures may include:  A procedure that uses an inflated balloon to open a blocked artery and improve blood flow (angioplasty).  A procedure to put in a tube (stent) to keep a blocked artery open (stent implant).  Surgery to reroute blood flow around a blocked artery (peripheral bypass surgery).  Surgery to remove dead tissue from an infected wound on the affected limb.  Amputation. This is surgical removal of the affected limb. This may be necessary in cases of acute ischemic limb that are not improved through medical or surgical treatments. HOME CARE INSTRUCTIONS  Take medicines only as directed by your health care provider.  Do not use any tobacco products, including cigarettes, chewing tobacco, or electronic cigarettes. If you need help quitting, ask your health care provider.  Lose weight if you are overweight, and maintain a healthy weight as directed by your health care provider.  Eat a diet that is low in fat and cholesterol. If you need help, ask your health care provider.  Exercise regularly. Ask your health care provider to suggest some good activities for you.  Use compression stockings or other mechanical devices as directed by your health care provider.  Take good care of your feet.  Wear comfortable shoes that fit well.  Check your feet often for any cuts or sores. SEEK MEDICAL CARE IF:  You have cramps in your legs  while walking.  You have leg pain when you are at rest.  You have coldness in a leg or foot.  Your skin changes.  You have erectile dysfunction.  You have cuts or sores on your feet that are not healing. SEEK IMMEDIATE MEDICAL CARE IF:  Your arm or leg turns cold and blue.  Your arms or legs become red, warm, swollen, painful, or numb.  You have chest pain or trouble breathing.  You suddenly have weakness in your face, arm, or leg.  You become very confused or lose the ability to speak.  You suddenly have a very bad headache or lose your vision.   This information is not intended to replace advice given to you by your health care provider. Make sure you discuss any questions you have with your health care provider.   Document Released: 12/28/2004 Document Revised: 12/11/2014 Document Reviewed: 04/30/2014 Elsevier Interactive Patient Education 2016 Elsevier Inc.     Steps to Quit Smoking  Smoking tobacco can be harmful to your health and can affect almost every organ in your body. Smoking puts you, and those around you, at risk for developing many serious chronic diseases. Quitting smoking is difficult, but it is one of   the best things that you can do for your health. It is never too late to quit. WHAT ARE THE BENEFITS OF QUITTING SMOKING? When you quit smoking, you lower your risk of developing serious diseases and conditions, such as:  Lung cancer or lung disease, such as COPD.  Heart disease.  Stroke.  Heart attack.  Infertility.  Osteoporosis and bone fractures. Additionally, symptoms such as coughing, wheezing, and shortness of breath may get better when you quit. You may also find that you get sick less often because your body is stronger at fighting off colds and infections. If you are pregnant, quitting smoking can help to reduce your chances of having a baby of low birth weight. HOW DO I GET READY TO QUIT? When you decide to quit smoking, create a plan to  make sure that you are successful. Before you quit:  Pick a date to quit. Set a date within the next two weeks to give you time to prepare.  Write down the reasons why you are quitting. Keep this list in places where you will see it often, such as on your bathroom mirror or in your car or wallet.  Identify the people, places, things, and activities that make you want to smoke (triggers) and avoid them. Make sure to take these actions:  Throw away all cigarettes at home, at work, and in your car.  Throw away smoking accessories, such as ashtrays and lighters.  Clean your car and make sure to empty the ashtray.  Clean your home, including curtains and carpets.  Tell your family, friends, and coworkers that you are quitting. Support from your loved ones can make quitting easier.  Talk with your health care provider about your options for quitting smoking.  Find out what treatment options are covered by your health insurance. WHAT STRATEGIES CAN I USE TO QUIT SMOKING?  Talk with your healthcare provider about different strategies to quit smoking. Some strategies include:  Quitting smoking altogether instead of gradually lessening how much you smoke over a period of time. Research shows that quitting "cold turkey" is more successful than gradually quitting.  Attending in-person counseling to help you build problem-solving skills. You are more likely to have success in quitting if you attend several counseling sessions. Even short sessions of 10 minutes can be effective.  Finding resources and support systems that can help you to quit smoking and remain smoke-free after you quit. These resources are most helpful when you use them often. They can include:  Online chats with a counselor.  Telephone quitlines.  Printed self-help materials.  Support groups or group counseling.  Text messaging programs.  Mobile phone applications.  Taking medicines to help you quit smoking. (If you are  pregnant or breastfeeding, talk with your health care provider first.) Some medicines contain nicotine and some do not. Both types of medicines help with cravings, but the medicines that include nicotine help to relieve withdrawal symptoms. Your health care provider may recommend:  Nicotine patches, gum, or lozenges.  Nicotine inhalers or sprays.  Non-nicotine medicine that is taken by mouth. Talk with your health care provider about combining strategies, such as taking medicines while you are also receiving in-person counseling. Using these two strategies together makes you more likely to succeed in quitting than if you used either strategy on its own. If you are pregnant or breastfeeding, talk with your health care provider about finding counseling or other support strategies to quit smoking. Do not take medicine to help you   quit smoking unless told to do so by your health care provider. WHAT THINGS CAN I DO TO MAKE IT EASIER TO QUIT? Quitting smoking might feel overwhelming at first, but there is a lot that you can do to make it easier. Take these important actions:  Reach out to your family and friends and ask that they support and encourage you during this time. Call telephone quitlines, reach out to support groups, or work with a counselor for support.  Ask people who smoke to avoid smoking around you.  Avoid places that trigger you to smoke, such as bars, parties, or smoke-break areas at work.  Spend time around people who do not smoke.  Lessen stress in your life, because stress can be a smoking trigger for some people. To lessen stress, try:  Exercising regularly.  Deep-breathing exercises.  Yoga.  Meditating.  Performing a body scan. This involves closing your eyes, scanning your body from head to toe, and noticing which parts of your body are particularly tense. Purposefully relax the muscles in those areas.  Download or purchase mobile phone or tablet apps (applications)  that can help you stick to your quit plan by providing reminders, tips, and encouragement. There are many free apps, such as QuitGuide from the CDC (Centers for Disease Control and Prevention). You can find other support for quitting smoking (smoking cessation) through smokefree.gov and other websites. HOW WILL I FEEL WHEN I QUIT SMOKING? Within the first 24 hours of quitting smoking, you may start to feel some withdrawal symptoms. These symptoms are usually most noticeable 2-3 days after quitting, but they usually do not last beyond 2-3 weeks. Changes or symptoms that you might experience include:  Mood swings.  Restlessness, anxiety, or irritation.  Difficulty concentrating.  Dizziness.  Strong cravings for sugary foods in addition to nicotine.  Mild weight gain.  Constipation.  Nausea.  Coughing or a sore throat.  Changes in how your medicines work in your body.  A depressed mood.  Difficulty sleeping (insomnia). After the first 2-3 weeks of quitting, you may start to notice more positive results, such as:  Improved sense of smell and taste.  Decreased coughing and sore throat.  Slower heart rate.  Lower blood pressure.  Clearer skin.  The ability to breathe more easily.  Fewer sick days. Quitting smoking is very challenging for most people. Do not get discouraged if you are not successful the first time. Some people need to make many attempts to quit before they achieve long-term success. Do your best to stick to your quit plan, and talk with your health care provider if you have any questions or concerns.   This information is not intended to replace advice given to you by your health care provider. Make sure you discuss any questions you have with your health care provider.   Document Released: 11/14/2001 Document Revised: 04/06/2015 Document Reviewed: 04/06/2015 Elsevier Interactive Patient Education 2016 Elsevier Inc.  

## 2016-10-13 ENCOUNTER — Inpatient Hospital Stay (HOSPITAL_COMMUNITY): Admission: RE | Admit: 2016-10-13 | Payer: Self-pay | Source: Ambulatory Visit

## 2016-11-16 ENCOUNTER — Other Ambulatory Visit: Payer: Self-pay

## 2016-11-16 DIAGNOSIS — K824 Cholesterolosis of gallbladder: Secondary | ICD-10-CM

## 2016-11-20 ENCOUNTER — Ambulatory Visit: Payer: Self-pay | Admitting: Cardiovascular Disease

## 2016-12-08 ENCOUNTER — Other Ambulatory Visit: Payer: Self-pay | Admitting: Neurosurgery

## 2016-12-08 DIAGNOSIS — M542 Cervicalgia: Secondary | ICD-10-CM

## 2016-12-14 ENCOUNTER — Telehealth: Payer: Self-pay

## 2016-12-14 NOTE — Telephone Encounter (Signed)
Follow up Gallabadder Ultrasound has not been authorized by the patient's insurance. The patient will need to see Dr Bary Castilla first.

## 2016-12-15 ENCOUNTER — Ambulatory Visit
Admission: RE | Admit: 2016-12-15 | Discharge: 2016-12-15 | Disposition: A | Discharge status: Home / Self Care | Payer: Medicaid Other | Source: Ambulatory Visit | Attending: Neurosurgery | Admitting: Neurosurgery

## 2016-12-15 DIAGNOSIS — Z981 Arthrodesis status: Secondary | ICD-10-CM | POA: Diagnosis not present

## 2016-12-15 DIAGNOSIS — M2578 Osteophyte, vertebrae: Secondary | ICD-10-CM | POA: Diagnosis not present

## 2016-12-15 DIAGNOSIS — M4802 Spinal stenosis, cervical region: Secondary | ICD-10-CM | POA: Diagnosis not present

## 2016-12-15 DIAGNOSIS — M542 Cervicalgia: Secondary | ICD-10-CM | POA: Insufficient documentation

## 2016-12-25 ENCOUNTER — Other Ambulatory Visit: Payer: Self-pay | Admitting: Neurosurgery

## 2016-12-26 ENCOUNTER — Ambulatory Visit (INDEPENDENT_AMBULATORY_CARE_PROVIDER_SITE_OTHER): Payer: Medicaid Other | Admitting: Cardiovascular Disease

## 2016-12-26 ENCOUNTER — Encounter: Payer: Self-pay | Admitting: Cardiovascular Disease

## 2016-12-26 VITALS — BP 140/84 | HR 52 | Ht 67.0 in | Wt 147.0 lb

## 2016-12-26 DIAGNOSIS — I255 Ischemic cardiomyopathy: Secondary | ICD-10-CM

## 2016-12-26 DIAGNOSIS — I739 Peripheral vascular disease, unspecified: Secondary | ICD-10-CM

## 2016-12-26 DIAGNOSIS — I10 Essential (primary) hypertension: Secondary | ICD-10-CM

## 2016-12-26 DIAGNOSIS — I2510 Atherosclerotic heart disease of native coronary artery without angina pectoris: Secondary | ICD-10-CM

## 2016-12-26 DIAGNOSIS — Z720 Tobacco use: Secondary | ICD-10-CM

## 2016-12-26 DIAGNOSIS — E7800 Pure hypercholesterolemia, unspecified: Secondary | ICD-10-CM

## 2016-12-26 MED ORDER — ATORVASTATIN CALCIUM 20 MG PO TABS: 20.0000 mg | ORAL_TABLET | Freq: Every day | ORAL | 11 refills | Status: DC

## 2016-12-26 NOTE — Progress Notes (Signed)
Cardiology Office Note   Date:  12/26/2016   ID:  [name], DOB Dec 18, 1960, MRN [id number]  PCP:  Gayland Curry, MD  Cardiologist:   Kathlyn Sacramento, MD   Chief Complaint  Patient presents with  . other    2 month follow up. Meds reviewed by the pt. verbally. "doing well."       History of Present Illness: [name] is a 56 y.o. male who presents for a followup visit regarding PAD and coronary artery disease. He has a history of coronary artery disease, stent to his RCA in April 2008 with occluded circumflex, previous bilateral iliac stenting and most recently aortobifemoral bypass December 2015 by Dr. Kellie Simmering , mild to moderate renal artery stenosis on the left, history of hepatitis C, long alcohol history currently in remission, long history of smoking , hypertension, bipolar disorder and hyperlipidemia .  Most recent cardiac catheterization in July 2014 showed patent RCA stent with chronically occluded left circumflex artery. Ejection fraction was 40%.  During last visit, I increased carvedilol and added losartan for blood pressure control. Blood pressure improved since then. He ran out of atorvastatin 2 months ago. He has been doing reasonably well and denies any chest pain or worsening dyspnea. No significant leg claudication. He is scheduled for cervical spine surgery next week.  Past Medical History:  Diagnosis Date  . Anxiety   . Bipolar affective ([place])    takes Lithium daily  . Broken fingers   . Coronary artery disease    a. s/p PCI to RCA at [facility]; b. s/p PCI to RCA '08; c. Cath 05/2012 showed known occluded LCx, RCA stent w/ mild restenosis, LAD no significant dz, EF 35%; d. Cardiac cath in July of 2014 showed no significant change.   . Depression   . GERD (gastroesophageal reflux disease)    takes Protonix daily  . Hepatitis C    "diagnosed in the past 2 wk" (10/22/2012)  . History of ETOH abuse    quit 2007  . Hyperlipidemia   .  Hypertension    "pt. denies - no longer taking blood pressure medications"  . Myocardial infarction 1996; 1997; 2000's   "total of 3" (10/22/2012)  . PAD (peripheral artery disease) ([place])    2010 PTA & stent to left CIA & left SFA; 05/2012 PTA & stent to distal LCIA; 10/2012 thrombectomy and stents to [place]  . Schizophrenia ([place])   . Seizures ([place])    "when I get anxious" (10/22/2012)  . Tobacco abuse   . Urinary frequency     Past Surgical History:  Procedure Laterality Date  . ABDOMINAL AORTAGRAM N/A 10/22/2012   Procedure: ABDOMINAL Maxcine Ham;  Surgeon: Wellington Hampshire, MD;  Location: [place] CATH LAB;  Service: Cardiovascular;  Laterality: N/A;  . ABDOMINAL AORTAGRAM N/A 11/11/2014   Procedure: ABDOMINAL Maxcine Ham;  Surgeon: Wellington Hampshire, MD;  Location: [place] CATH LAB;  Service: Cardiovascular;  Laterality: N/A;  . ANTERIOR CERVICAL DECOMP/DISCECTOMY FUSION  01/2011   plate & screw placement   . AORTA - BILATERAL FEMORAL ARTERY BYPASS GRAFT N/A 11/20/2014   Procedure: AORTA BIFEMORAL BYPASS GRAFT;  Surgeon: Mal Misty, MD;  Location: [place];  Service: Vascular;  Laterality: N/A;  . BRAIN SURGERY    . CARDIAC CATHETERIZATION     Stent - 12  . CORONARY ANGIOPLASTY WITH STENT PLACEMENT  03/2007   to RCA  . ILIAC ARTERY STENT  10/22/2012   "2" (10/22/2012)  .  ILIAC ARTERY STENT  06/2012   left  . INCISIONAL HERNIA REPAIR N/A 11/08/2015   Procedure: INCISIONAL HERNIA REPAIR WITH MYOFASCIAL RELEASE;  Surgeon: Rolm Bookbinder, MD;  Location: [place];  Service: General;  Laterality: N/A;  . INSERTION OF MESH N/A 11/08/2015   Procedure: INSERTION OF MESH;  Surgeon: Rolm Bookbinder, MD;  Location: [place];  Service: General;  Laterality: N/A;  . LEFT HEART CATHETERIZATION WITH CORONARY ANGIOGRAM N/A 05/29/2012   Procedure: LEFT HEART CATHETERIZATION WITH CORONARY ANGIOGRAM;  Surgeon: Wellington Hampshire, MD;  Location: [place] CATH LAB;  Service: Cardiovascular;  Laterality: N/A;  . LIVER  BIOPSY  10/2012  . LOWER EXTREMITY ANGIOGRAM N/A 05/29/2012   Procedure: LOWER EXTREMITY ANGIOGRAM;  Surgeon: Wellington Hampshire, MD;  Location: [place] CATH LAB;  Service: Cardiovascular;  Laterality: N/A;  . PERCUTANEOUS STENT INTERVENTION Left 05/29/2012   Procedure: PERCUTANEOUS STENT INTERVENTION;  Surgeon: Wellington Hampshire, MD;  Location: [place] CATH LAB;  Service: Cardiovascular;  Laterality: Left;  . POSTERIOR FUSION CERVICAL SPINE  2012  . SUBDURAL HEMATOMA EVACUATION VIA CRANIOTOMY       Current Outpatient Prescriptions  Medication Sig Dispense Refill  . aspirin EC 81 MG tablet Take 1 tablet (81 mg total) by mouth daily. (Patient taking differently: Take 81 mg by mouth See admin instructions. Takes 2 to 3 times weekly (per patient)) 90 tablet 3  . atorvastatin (LIPITOR) 20 MG tablet Take 1 tablet (20 mg total) by mouth daily. 30 tablet 11  . carvedilol (COREG) 6.25 MG tablet Take 1 tablet (6.25 mg total) by mouth 2 (two) times daily with a meal. 60 tablet 5  . HYDROcodone-acetaminophen (NORCO/VICODIN) 5-325 MG tablet Take 1 tablet by mouth every 6 (six) hours as needed. For pain.  0  . lithium carbonate (ESKALITH) 450 MG CR tablet Take 450 mg by mouth 2 (two) times daily.     Marland Kitchen losartan (COZAAR) 50 MG tablet Take 1 tablet (50 mg total) by mouth daily. 30 tablet 5  . pantoprazole (PROTONIX) 40 MG tablet Take 40 mg by mouth daily before breakfast.  3   No current facility-administered medications for this visit.     Allergies:   Patient has no known allergies.    Social History:  The patient  reports that he has been smoking Cigarettes.  He has a 40.00 pack-year smoking history. He has never used smokeless tobacco. He reports that he drinks alcohol. He reports that he does not use drugs.   Family History:  The patient's family history includes Alcohol abuse in his other; COPD in his mother; Diabetes in his other; Emphysema in his father; Hyperlipidemia in his other; Hypertension in his other;  Seizures in his other.    ROS:  Please see the history of present illness.   Otherwise, review of systems are positive for none.   All other systems are reviewed and negative.    PHYSICAL EXAM: VS:  BP 140/84 (BP Location: Left Arm, Patient Position: Sitting, Cuff Size: Normal)   Pulse (!) 52   Ht 5\' 7"  (1.702 m)   Wt 147 lb (66.7 kg)   BMI 23.02 kg/m  , BMI Body mass index is 23.02 kg/m. GEN: Well nourished, well developed, in no acute distress  HEENT: normal  Neck: no JVD, carotid bruits, or masses Cardiac: RRR; no murmurs, rubs, or gallops,no edema  Respiratory:  clear to auscultation bilaterally, normal work of breathing GI: soft, nontender, nondistended, + BS MS: no deformity or atrophy  Skin:  warm and dry, no rash Neuro:  Strength and sensation are intact Psych: euthymic mood, full affect Vascular: Femoral pulses are normal. Distal pulses are palpable.  EKG:  EKG is ordered today. The ekg ordered today demonstrates sinus rhythm with left anterior fascicular block. possible old inferior infarct. No significant changes compared to most recent EKG.   Recent Labs: 03/27/2016: ALT 19; Hemoglobin 16.6; Platelets 188 09/22/2016: BUN 4; Creatinine, Ser 0.99; Potassium 4.9; Sodium 143    Lipid Panel    Component Value Date/Time   CHOL 154 09/15/2013 0941   TRIG 143 11/25/2014 0500   HDL 54 09/15/2013 0941   CHOLHDL 2.9 09/15/2013 0941   LDLCALC 82 09/15/2013 0941      Wt Readings from Last 3 Encounters:  12/26/16 147 lb (66.7 kg)  10/03/16 150 lb (68 kg)  09/15/16 145 lb 6.4 oz (66 kg)      No flowsheet data found.    ASSESSMENT AND PLAN:  1.   Coronary artery disease involving native coronary arteries without angina: Continue medical therapy  2. Peripheral arterial disease: Status post aortobifemoral bypass. Followed by [facility].  Normal Doppler in October 2017.  3. Essential hypertension: Blood pressure improved significantly after adding losartan.  4.  Hyperlipidemia: I reviewed his lipid profile which was done by Dr. Sedalia Muta this month. It showed an LDL of 104. He reports that he ran out of atorvastatin 2 months ago. I refilled this medication and instructed him to take it regularly. I suspect that his LDL will go down below 70 with this.  5. Tobacco use: Unfortunately, he continues to smoke and has not been able to quit.   6. Preoperative cardiovascular evaluation for cervical spine surgery: Given the patient's prior history of coronary artery disease, he is considered at moderate risk. He does not require further ischemic evaluation before surgery given the lack of anginal symptoms and no change in his EKG. Aspirin can be held 5-7 days before surgery if needed.   Disposition:   FU with me in 6 months  Signed,  Kathlyn Sacramento, MD  12/26/2016 4:28 PM    [place]

## 2016-12-26 NOTE — Patient Instructions (Signed)

## 2016-12-27 ENCOUNTER — Encounter (HOSPITAL_COMMUNITY)
Admission: RE | Admit: 2016-12-27 | Discharge: 2016-12-27 | Disposition: A | Discharge status: Home / Self Care | Payer: Medicaid Other | Source: Ambulatory Visit | Attending: Neurosurgery | Admitting: Neurosurgery

## 2016-12-27 ENCOUNTER — Encounter (HOSPITAL_COMMUNITY): Payer: Self-pay

## 2016-12-27 DIAGNOSIS — E785 Hyperlipidemia, unspecified: Secondary | ICD-10-CM | POA: Insufficient documentation

## 2016-12-27 DIAGNOSIS — Z9889 Other specified postprocedural states: Secondary | ICD-10-CM | POA: Diagnosis not present

## 2016-12-27 DIAGNOSIS — I10 Essential (primary) hypertension: Secondary | ICD-10-CM | POA: Insufficient documentation

## 2016-12-27 DIAGNOSIS — I739 Peripheral vascular disease, unspecified: Secondary | ICD-10-CM | POA: Diagnosis not present

## 2016-12-27 DIAGNOSIS — Z720 Tobacco use: Secondary | ICD-10-CM | POA: Insufficient documentation

## 2016-12-27 DIAGNOSIS — I2510 Atherosclerotic heart disease of native coronary artery without angina pectoris: Secondary | ICD-10-CM | POA: Insufficient documentation

## 2016-12-27 DIAGNOSIS — Z01812 Encounter for preprocedural laboratory examination: Secondary | ICD-10-CM | POA: Insufficient documentation

## 2016-12-27 LAB — COMPREHENSIVE METABOLIC PANEL
ALT: 38 U/L (ref 17–63)
AST: 27 U/L (ref 15–41)
Albumin: 4.4 g/dL (ref 3.5–5.0)
Alkaline Phosphatase: 55 U/L (ref 38–126)
Anion gap: 8 (ref 5–15)
BUN: 9 mg/dL (ref 6–20)
CO2: 27 mmol/L (ref 22–32)
Calcium: 9.8 mg/dL (ref 8.9–10.3)
Chloride: 104 mmol/L (ref 101–111)
Creatinine, Ser: 0.97 mg/dL (ref 0.61–1.24)
GFR calc Af Amer: >60 mL/min (ref >60)
GFR calc non Af Amer: >60 mL/min (ref >60)
Glucose, Bld: 79 mg/dL (ref 65–99)
Potassium: 4.4 mmol/L (ref 3.5–5.1)
Sodium: 139 mmol/L (ref 135–145)
Total Bilirubin: 1 mg/dL (ref 0.3–1.2)
Total Protein: 6.9 g/dL (ref 6.5–8.1)

## 2016-12-27 LAB — CBC
HCT: 45.4 % (ref 39.0–52.0)
Hemoglobin: 15.6 g/dL (ref 13.0–17.0)
MCH: 33.1 pg (ref 26.0–34.0)
MCHC: 34.4 g/dL (ref 30.0–36.0)
MCV: 96.4 fL (ref 78.0–100.0)
Platelets: 151 10*3/uL (ref 150–400)
RBC: 4.71 MIL/uL (ref 4.22–5.81)
RDW: 12.7 % (ref 11.5–15.5)
WBC: 5.9 10*3/uL (ref 4.0–10.5)

## 2016-12-27 LAB — SURGICAL PCR SCREEN
MRSA, PCR: NEGATIVE
Staphylococcus aureus: NEGATIVE

## 2016-12-27 LAB — TYPE AND SCREEN
ABO/RH(D): A POS
Antibody Screen: NEGATIVE

## 2016-12-27 NOTE — Progress Notes (Signed)
PCP -  Gayland Curry Cardiologist - Ar[place]  Chest x-ray - not needed EKG - 09/15/16 Stress Test - 11/16/14 ECHO - denies Cardiac Cath - 06/12/13 Sleep Study - denies   Instructed to hold aspirin 5-7 days by cardiologist   Patient denies shortness of breath, fever, cough and chest pain at PAT appointment   Patient verbalized understanding of instructions that was given to them at the PAT appointment. Patient expressed that there were no further questions.  Patient was also instructed that they will need to review over the PAT instructions again at home before the surgery.

## 2016-12-27 NOTE — Pre-Procedure Instructions (Signed)
[name]  12/27/2016      [id number] - [name],  - [address] [address] Phone: [phone] Fax: [phone]    Your procedure is scheduled on January 31  Report to [place] at 1200 A.M.  Call this number if you have problems the morning of surgery:  [phone]   Remember:  Do not eat food or drink liquids after midnight.   Take these medicines the morning of surgery with A SIP OF WATER carvedilol (COREG), HYDROcodone-acetaminophen (NORCO/VICODIN), lithium carbonate (ESKALITH), pantoprazole (PROTONIX)  7 days prior to surgery STOP taking any Aspirin, Aleve, Naproxen, Ibuprofen, Motrin, Advil, Goody's, BC's, all herbal medications, fish oil, and all vitamins    Do not wear jewelry.  Do not wear lotions, powders, or cologne, or deoderant.  Men may shave face and neck.  Do not bring valuables to the hospital.  [facility] is not responsible for any belongings or valuables.  Contacts, dentures or bridgework may not be worn into surgery.  Leave your suitcase in the car.  After surgery it may be brought to your room.  For patients admitted to the hospital, discharge time will be determined by your treatment team.  Patients discharged the day of surgery will not be allowed to drive home.    Special instructions:   [place]- Preparing For Surgery  Before surgery, you can play an important role. Because skin is not sterile, your skin needs to be as free of germs as possible. You can reduce the number of germs on your skin by washing with CHG (chlorahexidine gluconate) Soap before surgery.  CHG is an antiseptic cleaner which kills germs and bonds with the skin to continue killing germs even after washing.  Please do not use if you have an allergy to CHG or antibacterial soaps. If your skin becomes reddened/irritated stop using the CHG.  Do not shave (including legs and underarms) for at least 48 hours prior  to first CHG shower. It is OK to shave your face.  Please follow these instructions carefully.   1. Shower the NIGHT BEFORE SURGERY and the MORNING OF SURGERY with CHG.   2. If you chose to wash your hair, wash your hair first as usual with your normal shampoo.  3. After you shampoo, rinse your hair and body thoroughly to remove the shampoo.  4. Use CHG as you would any other liquid soap. You can apply CHG directly to the skin and wash gently with a scrungie or a clean washcloth.   5. Apply the CHG Soap to your body ONLY FROM THE NECK DOWN.  Do not use on open wounds or open sores. Avoid contact with your eyes, ears, mouth and genitals (private parts). Wash genitals (private parts) with your normal soap.  6. Wash thoroughly, paying special attention to the area where your surgery will be performed.  7. Thoroughly rinse your body with warm water from the neck down.  8. DO NOT shower/wash with your normal soap after using and rinsing off the CHG Soap.  9. Pat yourself dry with a CLEAN TOWEL.   10. Wear CLEAN PAJAMAS   11. Place CLEAN SHEETS on your bed the night of your first shower and DO NOT SLEEP WITH PETS.    Day of Surgery: Do not apply any deodorants/lotions. Please wear clean clothes to the hospital/surgery center.       Please read over the following fact sheets  that you were given.

## 2016-12-29 ENCOUNTER — Encounter (HOSPITAL_COMMUNITY): Payer: Self-pay | Admitting: Vascular Surgery

## 2016-12-29 NOTE — Progress Notes (Signed)
Anesthesia Chart Review: Patient is a 56 year old male scheduled for C4-5 ACDF on 01/03/2017 by Dr. Christella Noa.  History includes smoking, CAD (PCI to RCA 1997 and 2008; known occluded CX), MI (x3), PAD s/p  (2010 PTA & stent to left CIA & left SFA; 05/2012 PTA & stent to distal LCIA; 10/2012 thrombectomy and stents to LCIA and LEIA), s/p AFBG 11/20/14, HTN, hyperlipidemia, hepatitis C, seizures, history alcohol abuse, schizophrenia, anxiety, depression, Bipolar affective disorder, GERD, incisional hernia repair 11/08/15, ACDF 2012. BMI 23.   He had significant problems with confusion and delirium, possible DT's following AFBG 11/2014. He required re-intubation and psychiatry consult. Symptoms thought likely related alcohol abuse and psychiatric medications.   PCP is Dr. Gayland Curry. Cardiologist is Dr. Kathlyn Sacramento, last visit 12/26/16. Continue medical therapy for CAD recommended. He felt patient would be at least moderate risk for surgery. He did not think patient would require further ischemic evaluation prior to surgery and gave permission to hold aspirin 5-7 days if needed.  Medications include aspirin 81 mg (holding 5-7 days prior to surgery), Lipitor, Coreg, Norco, lithium, losartan, Protonix.   BP 139/72   Pulse 64   Temp 36.4 C   Resp 20   Ht 5\' 7"  (1.702 m)   Wt 148 lb 14.4 oz (67.5 kg)   SpO2 100%   BMI 23.32 kg/m   EKG 12/26/16: SB at 51 bpm, LAFB, possible inferior infarct (age undetermined).    Nuclear stress test 11/16/14:  1. Overall moderate risk scan 2. Moderate scar in the inferior and inferolateral territory. No significant ischemia.  3. EF 40% 4. LV global function mildly reduced 5. There is GI uptake artifact noted on this study  Cardiac cath 06/12/13 [facility]):  - significant 2 vessel CAD with patent RCA stent and chronically occluded L CX. (100% mid CX, 30% mid RCA.) No significant change in anatomy. - Global LV function moderately depressed. EF 40%.    Carotid U/S 07/01/14: Impression: Heterogenous plaque, bilaterally. 1-39% bilateral ICA stenosis. Patent vertebral arteries with antegrade flow. Normal subclavian arteries, bilaterally.  Preoperative labs noted. T&S done.   If no acute changes then I anticipate that he can proceed as planned.  George Hugh [facility] Short Stay Center/Anesthesiology Phone [phone] 12/29/2016 3:29 PM

## 2016-12-30 DIAGNOSIS — I10 Essential (primary) hypertension: Secondary | ICD-10-CM | POA: Diagnosis not present

## 2016-12-30 DIAGNOSIS — Z7982 Long term (current) use of aspirin: Secondary | ICD-10-CM | POA: Insufficient documentation

## 2016-12-30 DIAGNOSIS — F17210 Nicotine dependence, cigarettes, uncomplicated: Secondary | ICD-10-CM | POA: Diagnosis not present

## 2016-12-30 DIAGNOSIS — I2510 Atherosclerotic heart disease of native coronary artery without angina pectoris: Secondary | ICD-10-CM | POA: Diagnosis not present

## 2016-12-30 DIAGNOSIS — Z5321 Procedure and treatment not carried out due to patient leaving prior to being seen by health care provider: Secondary | ICD-10-CM | POA: Diagnosis not present

## 2016-12-30 DIAGNOSIS — Z79899 Other long term (current) drug therapy: Secondary | ICD-10-CM | POA: Insufficient documentation

## 2016-12-30 DIAGNOSIS — R079 Chest pain, unspecified: Secondary | ICD-10-CM | POA: Insufficient documentation

## 2016-12-31 ENCOUNTER — Emergency Department: Payer: Medicaid Other

## 2016-12-31 ENCOUNTER — Emergency Department
Admission: EM | Admit: 2016-12-31 | Discharge: 2016-12-31 | Disposition: A | Discharge status: Home / Self Care | Payer: Medicaid Other | Attending: Emergency Medicine | Admitting: Emergency Medicine

## 2016-12-31 LAB — BASIC METABOLIC PANEL
Anion gap: 8 (ref 5–15)
BUN: 7 mg/dL (ref 6–20)
CO2: 24 mmol/L (ref 22–32)
Calcium: 9.7 mg/dL (ref 8.9–10.3)
Chloride: 108 mmol/L (ref 101–111)
Creatinine, Ser: 0.81 mg/dL (ref 0.61–1.24)
GFR calc Af Amer: >60 mL/min (ref >60)
GFR calc non Af Amer: >60 mL/min (ref >60)
Glucose, Bld: 89 mg/dL (ref 65–99)
Potassium: 4.6 mmol/L (ref 3.5–5.1)
Sodium: 140 mmol/L (ref 135–145)

## 2016-12-31 LAB — CBC
HCT: 47.3 % (ref 40.0–52.0)
Hemoglobin: 16.1 g/dL (ref 13.0–18.0)
MCH: 32.9 pg (ref 26.0–34.0)
MCHC: 34.1 g/dL (ref 32.0–36.0)
MCV: 96.6 fL (ref 80.0–100.0)
Platelets: 172 10*3/uL (ref 150–440)
RBC: 4.89 MIL/uL (ref 4.40–5.90)
RDW: 13.3 % (ref 11.5–14.5)
WBC: 9.8 10*3/uL (ref 3.8–10.6)

## 2016-12-31 LAB — TROPONIN I: Troponin I: <0.03 ng/mL (ref <0.03)

## 2016-12-31 NOTE — ED Notes (Signed)
Pt walked into the ER about 10 steps and just stopped; appeared to be confused and unaware of where to go next; pt said he's here for chest pain and walked up to stat registration; directed pt to clerk just beside me and pt turned and began walking away from both of [place]; pt seems to be having some difficulty even following simple commands

## 2016-12-31 NOTE — ED Notes (Signed)
When MD entered the room, patient walked out stating "I don't want to talk to you." This RN called the first nurse and had her stop him to see if he was in any distress and wanted to be seen; he stated "Nah", he then lit up a cigarette and left.

## 2016-12-31 NOTE — ED Notes (Signed)
Report to Wells Guiles, RN; pt to go to xray via wheelchair and then to treatment room 3

## 2016-12-31 NOTE — ED Triage Notes (Signed)
Patient reports left sided chest pain that radiates down left arm since yesterday.  Patient is slow to answer questions at this time.

## 2017-01-03 ENCOUNTER — Ambulatory Visit (HOSPITAL_COMMUNITY): Admission: RE | Admit: 2017-01-03 | Payer: Medicaid Other | Source: Ambulatory Visit | Admitting: Neurosurgery

## 2017-01-03 ENCOUNTER — Encounter (HOSPITAL_COMMUNITY): Admission: RE | Payer: Self-pay | Source: Ambulatory Visit

## 2017-01-03 SURGERY — ANTERIOR CERVICAL DECOMPRESSION/DISCECTOMY FUSION 1 LEVEL
Anesthesia: General

## 2017-01-08 ENCOUNTER — Ambulatory Visit: Payer: Medicaid Other

## 2017-01-17 ENCOUNTER — Ambulatory Visit: Payer: Medicaid Other | Admitting: General Surgery

## 2017-01-23 ENCOUNTER — Other Ambulatory Visit: Payer: Self-pay | Admitting: Neurosurgery

## 2017-01-25 ENCOUNTER — Encounter (HOSPITAL_COMMUNITY): Payer: Self-pay | Admitting: *Deleted

## 2017-01-25 NOTE — Progress Notes (Signed)
Anesthesia Chart Review: SAME DAY WORK-UP.  Patient is a 56 year old male scheduled for C4-5 ACDF on 01/26/2017 by Dr. Christella Noa. Surgery was initially scheduled for 01/03/2017, but was postponed due to insurance reasons. He had previously been cleared by cardiology following 12/26/16 visit with Dr. Fletcher Anon. Since then he had a brief ED visit on 12/31/12 in which he presented somewhat confused (slow to answer questions) and reported left sided chest pain that radiates down left arm since the day prior. EKG stable. Troponin negative. By RN notes, once MD entered the room patient said he didn't want to be seen and left. Patient said he thinks it was for eating bad barbeque. Per PAT RN, he denied CP and SOB.    History includes smoking, CAD (PCI to RCA 1997 and 2008; known occluded CX), MI (x3), PAD s/p  (2010 PTA &stent to left CIA &left SFA; 05/2012 PTA &stent to distal LCIA; 10/2012 thrombectomy and stents to LCIA and LEIA), s/p AFBG 11/20/14, HTN, hyperlipidemia, hepatitis C, seizures, history alcohol abuse, schizophrenia, anxiety, depression, Bipolar affective disorder, GERD, incisional hernia repair 11/08/15, ACDF 2012. BMI 23.   He had significant problems with confusion and delirium, possible DT's following AFBG 11/2014. He required re-intubation and psychiatry consult. Symptoms thought likely related alcohol abuse and psychiatric medications.   PCP is Dr. Gayland Curry with [facility] (Care Everywhere). Cardiologist is Dr. Kathlyn Sacramento, last visit 12/26/16. Continue medical therapy for CAD recommended. He felt patient would be at least moderate risk for surgery. He did not think patient would require further ischemic evaluation prior to surgery and gave permission to hold aspirin 5-7 days if needed.  Medications include aspirin 81 mg (holding), Lipitor, Coreg, Norco, lithium, losartan, Protonix.   EKG 12/31/16: SR at 62 bpm, LAD, possible inferior infarct (cited on or before  08/14/06). Overall, EKG appears stable when compared to 12/26/16 tracing.  Nuclear stress test 11/16/14:  1. Overall moderate risk scan 2. Moderate scar in the inferior and inferolateral territory. No significant ischemia.  3. EF 40% 4. LV global function mildly reduced 5. There is GI uptake artifact noted on this study  Cardiac cath 06/12/13 [facility]):  - significant 2 vessel CAD with patent RCA stent and chronically occluded L CX. (100% mid CX, 30% mid RCA.) No significant change in anatomy. - Global LV function moderately depressed. EF 40%.   Carotid U/S 07/01/14: Impression: Heterogenous plaque, bilaterally. 1-39% bilateral ICA stenosis. Patent vertebral arteries with antegrade flow. Normal subclavian arteries, bilaterally.  CXR 12/31/16: IMPRESSION: 1. No active cardiopulmonary disease. 2. Coronary stents in place. 3. Aortic atherosclerosis.  Reviewed above with anesthesiologist Dr. Smith Robert. Patient is a same day work-up. He does have previous cardiac clearance (moderate risk) from a recent office visit but a single episode of chest pain since then (incomplete ED visit with negative troponin on 12/31/16). He will be further evaluated by his assigned anesthesiologist on the day of surgery. If any concerns then cardiology can be contacted at that time. He will need updated labs prior to surgery. CBC, BMET, and Troponin were all WNL on 12/31/16.    George Hugh [facility] Short Stay Center/Anesthesiology Phone [phone] 01/25/2017 5:05 PM

## 2017-01-25 NOTE — Progress Notes (Signed)
Pt stated that ED visit " was from eating some barbeque that probably was no good and caused chest pain ."  Anesthesia asked to review recent ED notes since last PAT.

## 2017-01-25 NOTE — Progress Notes (Signed)
Pt denies SOB and chest pain. Pt under the care of Dr. Fletcher Anon. Pt stated " nothing has changed ' when reviewing pt history. Pt made aware to stop taking Aspirin,vitamins, fish oil and herbal medications. Do not take any NSAIDs ie: Ibuprofen, Advil, Naproxen, BC and Goody Powder or any medication containing Aspirin. Pt stated that he had his pre-op instruction sheet from PAT visit. Pt verbalized understanding of all pre-op instructions.

## 2017-01-26 ENCOUNTER — Ambulatory Visit (HOSPITAL_COMMUNITY): Payer: Medicaid Other | Admitting: Vascular Surgery

## 2017-01-26 ENCOUNTER — Ambulatory Visit (HOSPITAL_COMMUNITY): Payer: Medicaid Other

## 2017-01-26 ENCOUNTER — Encounter (HOSPITAL_COMMUNITY)
Admission: RE | Disposition: A | Discharge status: Home / Self Care | Payer: Self-pay | Source: Ambulatory Visit | Attending: Neurosurgery

## 2017-01-26 ENCOUNTER — Ambulatory Visit (HOSPITAL_COMMUNITY)
Admission: RE | Admit: 2017-01-26 | Discharge: 2017-01-27 | Disposition: A | Discharge status: Home / Self Care | Payer: Medicaid Other | Source: Ambulatory Visit | Attending: Neurosurgery | Admitting: Neurosurgery

## 2017-01-26 ENCOUNTER — Encounter (HOSPITAL_COMMUNITY): Payer: Self-pay | Admitting: *Deleted

## 2017-01-26 DIAGNOSIS — E785 Hyperlipidemia, unspecified: Secondary | ICD-10-CM | POA: Insufficient documentation

## 2017-01-26 DIAGNOSIS — M4722 Other spondylosis with radiculopathy, cervical region: Secondary | ICD-10-CM | POA: Diagnosis present

## 2017-01-26 DIAGNOSIS — B1920 Unspecified viral hepatitis C without hepatic coma: Secondary | ICD-10-CM | POA: Diagnosis not present

## 2017-01-26 DIAGNOSIS — I10 Essential (primary) hypertension: Secondary | ICD-10-CM | POA: Diagnosis not present

## 2017-01-26 DIAGNOSIS — F209 Schizophrenia, unspecified: Secondary | ICD-10-CM | POA: Diagnosis not present

## 2017-01-26 DIAGNOSIS — I2510 Atherosclerotic heart disease of native coronary artery without angina pectoris: Secondary | ICD-10-CM | POA: Insufficient documentation

## 2017-01-26 DIAGNOSIS — I6523 Occlusion and stenosis of bilateral carotid arteries: Secondary | ICD-10-CM | POA: Diagnosis not present

## 2017-01-26 DIAGNOSIS — K219 Gastro-esophageal reflux disease without esophagitis: Secondary | ICD-10-CM | POA: Insufficient documentation

## 2017-01-26 DIAGNOSIS — I700 Atherosclerosis of aorta: Secondary | ICD-10-CM | POA: Insufficient documentation

## 2017-01-26 DIAGNOSIS — Z7982 Long term (current) use of aspirin: Secondary | ICD-10-CM | POA: Insufficient documentation

## 2017-01-26 DIAGNOSIS — F17210 Nicotine dependence, cigarettes, uncomplicated: Secondary | ICD-10-CM | POA: Diagnosis not present

## 2017-01-26 DIAGNOSIS — M2578 Osteophyte, vertebrae: Secondary | ICD-10-CM | POA: Diagnosis not present

## 2017-01-26 DIAGNOSIS — F419 Anxiety disorder, unspecified: Secondary | ICD-10-CM | POA: Insufficient documentation

## 2017-01-26 DIAGNOSIS — I739 Peripheral vascular disease, unspecified: Secondary | ICD-10-CM | POA: Insufficient documentation

## 2017-01-26 DIAGNOSIS — I252 Old myocardial infarction: Secondary | ICD-10-CM | POA: Insufficient documentation

## 2017-01-26 DIAGNOSIS — F319 Bipolar disorder, unspecified: Secondary | ICD-10-CM | POA: Diagnosis not present

## 2017-01-26 HISTORY — PX: ANTERIOR CERVICAL DECOMP/DISCECTOMY FUSION: SHX1161

## 2017-01-26 LAB — TYPE AND SCREEN
ABO/RH(D): A POS
Antibody Screen: NEGATIVE

## 2017-01-26 LAB — SURGICAL PCR SCREEN
MRSA, PCR: NEGATIVE
Staphylococcus aureus: NEGATIVE

## 2017-01-26 LAB — BASIC METABOLIC PANEL
Anion gap: 10 (ref 5–15)
BUN: 7 mg/dL (ref 6–20)
CO2: 22 mmol/L (ref 22–32)
Calcium: 9.7 mg/dL (ref 8.9–10.3)
Chloride: 105 mmol/L (ref 101–111)
Creatinine, Ser: 0.9 mg/dL (ref 0.61–1.24)
GFR calc Af Amer: >60 mL/min (ref >60)
GFR calc non Af Amer: >60 mL/min (ref >60)
Glucose, Bld: 89 mg/dL (ref 65–99)
Potassium: 4.4 mmol/L (ref 3.5–5.1)
Sodium: 137 mmol/L (ref 135–145)

## 2017-01-26 LAB — CBC
HCT: 44 % (ref 39.0–52.0)
Hemoglobin: 15.3 g/dL (ref 13.0–17.0)
MCH: 33.3 pg (ref 26.0–34.0)
MCHC: 34.8 g/dL (ref 30.0–36.0)
MCV: 95.7 fL (ref 78.0–100.0)
Platelets: 103 10*3/uL — ABNORMAL LOW (ref 150–400)
RBC: 4.6 MIL/uL (ref 4.22–5.81)
RDW: 12.8 % (ref 11.5–15.5)
WBC: 6.3 10*3/uL (ref 4.0–10.5)

## 2017-01-26 SURGERY — ANTERIOR CERVICAL DECOMPRESSION/DISCECTOMY FUSION 1 LEVEL
Anesthesia: General | Site: Spine Cervical

## 2017-01-26 MED ORDER — MIDAZOLAM HCL 2 MG/2ML IJ SOLN: 0.5000 mg | Freq: Once | INTRAMUSCULAR | Status: DC | PRN

## 2017-01-26 MED ORDER — PHENYLEPHRINE 40 MCG/ML (10ML) SYRINGE FOR IV PUSH (FOR BLOOD PRESSURE SUPPORT)
PREFILLED_SYRINGE | INTRAVENOUS | Status: AC
  Filled 2017-01-26: qty 10

## 2017-01-26 MED ORDER — MUPIROCIN 2 % EX OINT
TOPICAL_OINTMENT | CUTANEOUS | Status: AC
  Administered 2017-01-26: 08:00:00
  Filled 2017-01-26: qty 22

## 2017-01-26 MED ORDER — DIAZEPAM 5 MG PO TABS
5.0000 mg | ORAL_TABLET | Freq: Four times a day (QID) | ORAL | Status: DC | PRN
  Administered 2017-01-26 (×2): 5 mg via ORAL
  Filled 2017-01-26 (×2): qty 1

## 2017-01-26 MED ORDER — LIDOCAINE-EPINEPHRINE (PF) 2 %-1:200000 IJ SOLN
INTRAMUSCULAR | Status: DC | PRN
  Administered 2017-01-26: 3 mL via INTRADERMAL

## 2017-01-26 MED ORDER — POTASSIUM CHLORIDE IN NACL 20-0.9 MEQ/L-% IV SOLN: INTRAVENOUS | Status: DC

## 2017-01-26 MED ORDER — TIZANIDINE HCL 4 MG PO TABS: 4.0000 mg | ORAL_TABLET | Freq: Four times a day (QID) | ORAL | 0 refills | Status: DC | PRN

## 2017-01-26 MED ORDER — PHENYLEPHRINE HCL 10 MG/ML IJ SOLN
INTRAMUSCULAR | Status: DC | PRN
  Administered 2017-01-26: 15 ug/min via INTRAVENOUS

## 2017-01-26 MED ORDER — SUGAMMADEX SODIUM 200 MG/2ML IV SOLN
INTRAVENOUS | Status: DC | PRN
  Administered 2017-01-26: 150 mg via INTRAVENOUS

## 2017-01-26 MED ORDER — PROPOFOL 10 MG/ML IV BOLUS
INTRAVENOUS | Status: AC
  Filled 2017-01-26: qty 40

## 2017-01-26 MED ORDER — PROMETHAZINE HCL 25 MG/ML IJ SOLN
6.2500 mg | INTRAMUSCULAR | Status: DC | PRN
  Administered 2017-01-26: 6.25 mg via INTRAVENOUS

## 2017-01-26 MED ORDER — SODIUM CHLORIDE 0.9% FLUSH
3.0000 mL | Freq: Two times a day (BID) | INTRAVENOUS | Status: DC
  Administered 2017-01-26: 3 mL via INTRAVENOUS

## 2017-01-26 MED ORDER — CARVEDILOL 6.25 MG PO TABS
6.2500 mg | ORAL_TABLET | Freq: Two times a day (BID) | ORAL | Status: DC
  Administered 2017-01-26: 6.25 mg via ORAL
  Filled 2017-01-26: qty 1

## 2017-01-26 MED ORDER — HYDROMORPHONE HCL 1 MG/ML IJ SOLN
INTRAMUSCULAR | Status: AC
  Filled 2017-01-26: qty 0.5

## 2017-01-26 MED ORDER — THROMBIN 5000 UNITS EX SOLR
CUTANEOUS | Status: DC | PRN
  Administered 2017-01-26: 10000 [IU] via TOPICAL

## 2017-01-26 MED ORDER — ROCURONIUM BROMIDE 50 MG/5ML IV SOSY
PREFILLED_SYRINGE | INTRAVENOUS | Status: AC
  Filled 2017-01-26: qty 10

## 2017-01-26 MED ORDER — LOSARTAN POTASSIUM 50 MG PO TABS
50.0000 mg | ORAL_TABLET | Freq: Every day | ORAL | Status: DC
  Administered 2017-01-26: 50 mg via ORAL
  Filled 2017-01-26: qty 1

## 2017-01-26 MED ORDER — ACETAMINOPHEN 325 MG PO TABS: 650.0000 mg | ORAL_TABLET | ORAL | Status: DC | PRN

## 2017-01-26 MED ORDER — ONDANSETRON HCL 4 MG/2ML IJ SOLN
INTRAMUSCULAR | Status: DC | PRN
  Administered 2017-01-26: 4 mg via INTRAVENOUS

## 2017-01-26 MED ORDER — 0.9 % SODIUM CHLORIDE (POUR BTL) OPTIME
TOPICAL | Status: DC | PRN
  Administered 2017-01-26: 1000 mL

## 2017-01-26 MED ORDER — PANTOPRAZOLE SODIUM 40 MG PO TBEC
40.0000 mg | DELAYED_RELEASE_TABLET | Freq: Every day | ORAL | Status: DC
  Administered 2017-01-27: 40 mg via ORAL
  Filled 2017-01-26: qty 1

## 2017-01-26 MED ORDER — LACTATED RINGERS IV SOLN
INTRAVENOUS | Status: DC
  Administered 2017-01-26 (×3): via INTRAVENOUS

## 2017-01-26 MED ORDER — FENTANYL CITRATE (PF) 100 MCG/2ML IJ SOLN
INTRAMUSCULAR | Status: DC | PRN
Start: 2017-01-26 — End: 2017-01-26
  Administered 2017-01-26 (×2): 50 ug via INTRAVENOUS
  Administered 2017-01-26 (×2): 25 ug via INTRAVENOUS
  Administered 2017-01-26: 50 ug via INTRAVENOUS

## 2017-01-26 MED ORDER — FENTANYL CITRATE (PF) 100 MCG/2ML IJ SOLN
INTRAMUSCULAR | Status: AC
  Filled 2017-01-26: qty 4

## 2017-01-26 MED ORDER — ROCURONIUM BROMIDE 100 MG/10ML IV SOLN
INTRAVENOUS | Status: DC | PRN
  Administered 2017-01-26: 10 mg via INTRAVENOUS
  Administered 2017-01-26: 50 mg via INTRAVENOUS

## 2017-01-26 MED ORDER — THROMBIN 5000 UNITS EX SOLR
CUTANEOUS | Status: AC
  Filled 2017-01-26: qty 10000

## 2017-01-26 MED ORDER — OXYCODONE HCL 5 MG PO TABS
5.0000 mg | ORAL_TABLET | ORAL | Status: DC | PRN
  Administered 2017-01-26 – 2017-01-27 (×4): 10 mg via ORAL
  Filled 2017-01-26 (×4): qty 2

## 2017-01-26 MED ORDER — HYDROCODONE-ACETAMINOPHEN 5-325 MG PO TABS
1.0000 | ORAL_TABLET | ORAL | Status: DC | PRN
  Administered 2017-01-26: 2 via ORAL
  Filled 2017-01-26: qty 2

## 2017-01-26 MED ORDER — FENTANYL CITRATE (PF) 100 MCG/2ML IJ SOLN
INTRAMUSCULAR | Status: AC
  Filled 2017-01-26: qty 2

## 2017-01-26 MED ORDER — CEFAZOLIN SODIUM 1 G IJ SOLR
INTRAMUSCULAR | Status: DC | PRN
  Administered 2017-01-26: 2 g via INTRAMUSCULAR

## 2017-01-26 MED ORDER — LIDOCAINE 2% (20 MG/ML) 5 ML SYRINGE
INTRAMUSCULAR | Status: AC
  Filled 2017-01-26: qty 10

## 2017-01-26 MED ORDER — MEPERIDINE HCL 25 MG/ML IJ SOLN: 6.2500 mg | INTRAMUSCULAR | Status: DC | PRN

## 2017-01-26 MED ORDER — HYDROMORPHONE HCL 1 MG/ML IJ SOLN
0.2500 mg | INTRAMUSCULAR | Status: DC | PRN
  Administered 2017-01-26 (×3): 0.5 mg via INTRAVENOUS

## 2017-01-26 MED ORDER — LITHIUM CARBONATE 300 MG PO CAPS
300.0000 mg | ORAL_CAPSULE | Freq: Two times a day (BID) | ORAL | Status: DC
  Administered 2017-01-26: 300 mg via ORAL
  Filled 2017-01-26 (×2): qty 1

## 2017-01-26 MED ORDER — MUPIROCIN 2 % EX OINT: 1.0000 "application " | TOPICAL_OINTMENT | Freq: Once | CUTANEOUS | Status: DC

## 2017-01-26 MED ORDER — LIDOCAINE-EPINEPHRINE (PF) 2 %-1:200000 IJ SOLN
INTRAMUSCULAR | Status: AC
  Filled 2017-01-26: qty 20

## 2017-01-26 MED ORDER — ACETAMINOPHEN 650 MG RE SUPP: 650.0000 mg | RECTAL | Status: DC | PRN

## 2017-01-26 MED ORDER — ATORVASTATIN CALCIUM 20 MG PO TABS: 20.0000 mg | ORAL_TABLET | Freq: Every day | ORAL | Status: DC

## 2017-01-26 MED ORDER — SODIUM CHLORIDE 0.9% FLUSH: 3.0000 mL | INTRAVENOUS | Status: DC | PRN

## 2017-01-26 MED ORDER — ONDANSETRON HCL 4 MG/2ML IJ SOLN
INTRAMUSCULAR | Status: AC
  Filled 2017-01-26: qty 2

## 2017-01-26 MED ORDER — PROPOFOL 10 MG/ML IV BOLUS
INTRAVENOUS | Status: DC | PRN
  Administered 2017-01-26: 20 mg via INTRAVENOUS
  Administered 2017-01-26: 30 mg via INTRAVENOUS
  Administered 2017-01-26: 20 mg via INTRAVENOUS
  Administered 2017-01-26: 100 mg via INTRAVENOUS

## 2017-01-26 MED ORDER — ACETAMINOPHEN 325 MG PO TABS: 650.0000 mg | ORAL_TABLET | Freq: Four times a day (QID) | ORAL | Status: DC | PRN

## 2017-01-26 MED ORDER — LIDOCAINE HCL 4 % EX SOLN
CUTANEOUS | Status: DC | PRN
  Administered 2017-01-26: 4 mL via TOPICAL

## 2017-01-26 MED ORDER — GLYCOPYRROLATE 0.2 MG/ML IJ SOLN
INTRAMUSCULAR | Status: DC | PRN
  Administered 2017-01-26: 0.2 mg via INTRAVENOUS

## 2017-01-26 MED ORDER — OXYCODONE HCL 5 MG PO TABS: 5.0000 mg | ORAL_TABLET | Freq: Four times a day (QID) | ORAL | 0 refills | Status: DC | PRN

## 2017-01-26 MED ORDER — ZOLPIDEM TARTRATE 5 MG PO TABS: 5.0000 mg | ORAL_TABLET | Freq: Every evening | ORAL | Status: DC | PRN

## 2017-01-26 MED ORDER — LIDOCAINE HCL (CARDIAC) 20 MG/ML IV SOLN
INTRAVENOUS | Status: DC | PRN
  Administered 2017-01-26: 20 mg via INTRAVENOUS

## 2017-01-26 MED ORDER — PHENYLEPHRINE HCL 10 MG/ML IJ SOLN
INTRAMUSCULAR | Status: DC | PRN
  Administered 2017-01-26: 40 ug via INTRAVENOUS

## 2017-01-26 MED ORDER — PROMETHAZINE HCL 25 MG/ML IJ SOLN
INTRAMUSCULAR | Status: AC
  Filled 2017-01-26: qty 1

## 2017-01-26 MED ORDER — MIDAZOLAM HCL 2 MG/2ML IJ SOLN
INTRAMUSCULAR | Status: AC
  Filled 2017-01-26: qty 2

## 2017-01-26 MED ORDER — MENTHOL 3 MG MT LOZG
1.0000 | LOZENGE | OROMUCOSAL | Status: DC | PRN
  Administered 2017-01-26: 3 mg via ORAL
  Filled 2017-01-26: qty 9

## 2017-01-26 MED ORDER — PHENOL 1.4 % MT LIQD: 1.0000 | OROMUCOSAL | Status: DC | PRN

## 2017-01-26 MED ORDER — HEMOSTATIC AGENTS (NO CHARGE) OPTIME
TOPICAL | Status: DC | PRN
  Administered 2017-01-26: 1 via TOPICAL

## 2017-01-26 MED ORDER — SODIUM CHLORIDE 0.9 % IV SOLN: 250.0000 mL | INTRAVENOUS | Status: DC

## 2017-01-26 MED ORDER — ONDANSETRON HCL 4 MG/2ML IJ SOLN: 4.0000 mg | INTRAMUSCULAR | Status: DC | PRN

## 2017-01-26 MED ORDER — SUGAMMADEX SODIUM 200 MG/2ML IV SOLN
INTRAVENOUS | Status: AC
  Filled 2017-01-26: qty 2

## 2017-01-26 MED ORDER — CELECOXIB 200 MG PO CAPS
200.0000 mg | ORAL_CAPSULE | Freq: Two times a day (BID) | ORAL | Status: DC
  Administered 2017-01-26 (×2): 200 mg via ORAL
  Filled 2017-01-26 (×2): qty 1

## 2017-01-26 MED ORDER — ACETAMINOPHEN 650 MG RE SUPP: 650.0000 mg | Freq: Four times a day (QID) | RECTAL | Status: DC | PRN

## 2017-01-26 SURGICAL SUPPLY — 77 items
ADH SKN CLS APL DERMABOND .7 (GAUZE/BANDAGES/DRESSINGS) ×1
BLADE CLIPPER SURG (BLADE) IMPLANT
BNDG GAUZE ELAST 4 BULKY (GAUZE/BANDAGES/DRESSINGS) IMPLANT
BUR DRUM 4.0 (BURR) ×1 IMPLANT
BUR MATCHSTICK NEURO 3.0 LAGG (BURR) ×2 IMPLANT
CANISTER SUCT 3000ML PPV (MISCELLANEOUS) ×2 IMPLANT
CARTRIDGE OIL MAESTRO DRILL (MISCELLANEOUS) ×1 IMPLANT
DECANTER SPIKE VIAL GLASS SM (MISCELLANEOUS) ×2 IMPLANT
DERMABOND ADVANCED (GAUZE/BANDAGES/DRESSINGS) ×1
DERMABOND ADVANCED .7 DNX12 (GAUZE/BANDAGES/DRESSINGS) ×1 IMPLANT
DIFFUSER DRILL AIR PNEUMATIC (MISCELLANEOUS) ×2 IMPLANT
DRAPE HALF SHEET 40X57 (DRAPES) ×2 IMPLANT
DRAPE LAPAROTOMY 100X72 PEDS (DRAPES) ×2 IMPLANT
DRAPE MICROSCOPE LEICA (MISCELLANEOUS) ×2 IMPLANT
DRAPE POUCH INSTRU U-SHP 10X18 (DRAPES) ×2 IMPLANT
DURAPREP 6ML APPLICATOR 50/CS (WOUND CARE) ×2 IMPLANT
ELECT COATED BLADE 2.86 ST (ELECTRODE) ×2 IMPLANT
ELECT REM PT RETURN 9FT ADLT (ELECTROSURGICAL) ×2
ELECTRODE REM PT RTRN 9FT ADLT (ELECTROSURGICAL) ×1 IMPLANT
GAUZE SPONGE 4X4 16PLY XRAY LF (GAUZE/BANDAGES/DRESSINGS) IMPLANT
GLOVE BIO SURGEON STRL SZ 6.5 (GLOVE) IMPLANT
GLOVE BIO SURGEON STRL SZ7 (GLOVE) IMPLANT
GLOVE BIO SURGEON STRL SZ7.5 (GLOVE) IMPLANT
GLOVE BIO SURGEON STRL SZ8 (GLOVE) IMPLANT
GLOVE BIO SURGEON STRL SZ8.5 (GLOVE) IMPLANT
GLOVE BIOGEL M 8.0 STRL (GLOVE) IMPLANT
GLOVE BIOGEL PI IND STRL 7.0 (GLOVE) IMPLANT
GLOVE BIOGEL PI IND STRL 7.5 (GLOVE) IMPLANT
GLOVE BIOGEL PI INDICATOR 7.0 (GLOVE) ×1
GLOVE BIOGEL PI INDICATOR 7.5 (GLOVE) ×3
GLOVE ECLIPSE 6.5 STRL STRAW (GLOVE) ×2 IMPLANT
GLOVE ECLIPSE 7.0 STRL STRAW (GLOVE) ×3 IMPLANT
GLOVE ECLIPSE 7.5 STRL STRAW (GLOVE) IMPLANT
GLOVE ECLIPSE 8.0 STRL XLNG CF (GLOVE) IMPLANT
GLOVE ECLIPSE 8.5 STRL (GLOVE) IMPLANT
GLOVE EXAM NITRILE LRG STRL (GLOVE) IMPLANT
GLOVE EXAM NITRILE XL STR (GLOVE) IMPLANT
GLOVE EXAM NITRILE XS STR PU (GLOVE) IMPLANT
GLOVE INDICATOR 6.5 STRL GRN (GLOVE) IMPLANT
GLOVE INDICATOR 7.0 STRL GRN (GLOVE) IMPLANT
GLOVE INDICATOR 7.5 STRL GRN (GLOVE) IMPLANT
GLOVE INDICATOR 8.0 STRL GRN (GLOVE) IMPLANT
GLOVE INDICATOR 8.5 STRL (GLOVE) IMPLANT
GLOVE OPTIFIT SS 8.0 STRL (GLOVE) IMPLANT
GLOVE SS BIOGEL STRL SZ 7.5 (GLOVE) IMPLANT
GLOVE SUPERSENSE BIOGEL SZ 7.5 (GLOVE) ×1
GLOVE SURG SS PI 6.5 STRL IVOR (GLOVE) IMPLANT
GLOVE SURG SS PI 7.0 STRL IVOR (GLOVE) ×2 IMPLANT
GOWN STRL REUS W/ TWL LRG LVL3 (GOWN DISPOSABLE) ×2 IMPLANT
GOWN STRL REUS W/ TWL XL LVL3 (GOWN DISPOSABLE) IMPLANT
GOWN STRL REUS W/TWL 2XL LVL3 (GOWN DISPOSABLE) IMPLANT
GOWN STRL REUS W/TWL LRG LVL3 (GOWN DISPOSABLE) ×10
GOWN STRL REUS W/TWL XL LVL3 (GOWN DISPOSABLE)
KIT BASIN OR (CUSTOM PROCEDURE TRAY) ×2 IMPLANT
KIT ROOM TURNOVER OR (KITS) ×2 IMPLANT
NDL HYPO 25X1 1.5 SAFETY (NEEDLE) ×1 IMPLANT
NDL SPNL 22GX3.5 QUINCKE BK (NEEDLE) ×1 IMPLANT
NEEDLE HYPO 25X1 1.5 SAFETY (NEEDLE) ×2 IMPLANT
NEEDLE SPNL 22GX3.5 QUINCKE BK (NEEDLE) ×2 IMPLANT
NS IRRIG 1000ML POUR BTL (IV SOLUTION) ×2 IMPLANT
OIL CARTRIDGE MAESTRO DRILL (MISCELLANEOUS) ×2
PACK LAMINECTOMY NEURO (CUSTOM PROCEDURE TRAY) ×2 IMPLANT
PAD ARMBOARD 7.5X6 YLW CONV (MISCELLANEOUS) ×6 IMPLANT
PIN DISTRACTION 14MM (PIN) ×4 IMPLANT
PLATE HELIX-R 24MM (Plate) ×1 IMPLANT
RUBBERBAND STERILE (MISCELLANEOUS) ×4 IMPLANT
SCREW 4.0X13 (Screw) IMPLANT
SCREW 4.0X13MM (Screw) ×4 IMPLANT
SPACER CC-ACF 8MM PARALLEL (Bone Implant) ×1 IMPLANT
SPONGE INTESTINAL PEANUT (DISPOSABLE) ×2 IMPLANT
SPONGE SURGIFOAM ABS GEL SZ50 (HEMOSTASIS) ×2 IMPLANT
SUT VIC AB 0 CT1 27 (SUTURE) ×2
SUT VIC AB 0 CT1 27XBRD ANTBC (SUTURE) ×1 IMPLANT
SUT VIC AB 3-0 SH 8-18 (SUTURE) ×3 IMPLANT
TOWEL OR 17X24 6PK STRL BLUE (TOWEL DISPOSABLE) ×2 IMPLANT
TOWEL OR 17X26 10 PK STRL BLUE (TOWEL DISPOSABLE) ×2 IMPLANT
WATER STERILE IRR 1000ML POUR (IV SOLUTION) ×2 IMPLANT

## 2017-01-26 NOTE — Discharge Instructions (Signed)

## 2017-01-26 NOTE — Op Note (Signed)
01/26/2017  12:03 PM  PATIENT:  [name]  56 y.o. male with pain in the upper extremities secondary to an osteophyte compressing the right C5 nerve root. He wished to proceed with operative decompression of the C5 root.  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 was taken to the operating room, intubated, and placed under general anesthesia without difficulty. He was positioned supine with his head in slight extension on a horseshoe headrest. The neck was prepped and draped in a sterile manner. I infiltrated 5 cc's 1/2%lidocaine/1:200,000 strength epinephrine into the planned incision starting from the midline to the medial border of the left sternocleidomastoid muscle. I opened the incision with a 10 blade and dissected sharply through soft tissue to the platysma. I dissected in the plane superior to the platysma both rostrally and caudally. I then opened the platysma in a horizontal fashion with Metzenbaum scissors, and dissected in the inferior plane rostrally and caudally. With both blunt and sharp technique I created an avascular corridor to the cervical spine. I placed a spinal needle(s) in the disc space at 4/5 . I then reflected the longus colli from C4 to C5 and placed self retaining retractors. I opened the disc space(s) at 4/5 with a 15 blade. I removed disc with curettes, Kerrison punches,  and the drill. Using the drill I removed osteophytes and prepared for the decompression.  I decompressed the spinal canal and the C5 root(s) with the drill, Kerrison punches, and the curettes. I used the microscope to aid in microdissection. I removed the posterior longitudinal ligament to fully expose and decompress the thecal sac. I exposed the roots laterally taking down the 4/5 uncovertebral joints. With the decompression complete I moved on to the arthrodesis. We used the drill to level the surfaces of C4/5. I removed soft tissue to prepare the disc space and the bony surfaces. I measured the space and placed an 20mm structural allograft into the disc space.  I then placed the anterior instrumentation. I placed 2 screws in each vertebral body through the plate. I locked the screws into place. Intraoperative xray showed the graft, plate, and screws to be in good position. I irrigated the wound, achieved hemostasis, and closed the wound in layers. We approximated the platysma, and the subcuticular plane with vicryl sutures. I used Dermabond for a sterile dressing.   PLAN OF CARE: Admit for overnight observation  PATIENT DISPOSITION:  PACU - hemodynamically stable.   Delay start of Pharmacological VTE agent (>24hrs) due to surgical blood loss or risk of bleeding:  yes

## 2017-01-26 NOTE — H&P (Signed)
BP 134/64   Pulse (!) 55   Temp 97.9 F (36.6 C) (Oral)   Resp 20   Ht 5\' 7"  (1.702 m)   Wt 72.6 kg (160 lb)   SpO2 97%   BMI 25.06 kg/m  [name] returned today. I operated on his neck and did a ACDF years ago, and he did very well and actually has not been to the practice in a while, but he returns today stating that he has had pain since right after Thanksgiving which is fairly severe in his neck. It radiates into both shoulders. A lot of the pain statically is on the right side, and he is always rubbing the right shoulder region, supraclavicular area, but he does have pain on the left. Now it doesn't make it into his upper extremities. He has not had weakness. He has no bowel or bladder dysfunction. He doesn't feel much in the way of tingles, but it keeps him from sleeping, and he just finds it really difficult to go to sleep, and then once he is awake at night, he finds it difficult to stay asleep. He is currently unemployed. He is left-handed. He says the pain comes and goes, but it has gotten worse, and he said it is now continuous, whereas when he started, it was waxing and waning.  REVIEW OF SYSTEMS: Review of systems is positive for the neck pain. PAST MEDICAL HISTORY: He has a history of hypertension and myocardial infarction.  PAST SURGICAL HISTORY: He has undergone herniorrhaphy, he has had bypass surgery, and he has had brain surgery along with the surgical surgery. FAMILY HISTORY: Mother is 10 and is in fair health. Father is deceased.  SOCIAL HISTORY: He does smoke, and he has a 40 pack year history. No history of substance abuse. He does not use alcohol.  ALLERGIES: He has no known drug allergies. He listed lisinopril, but he says that causes an elevated potassium, which clearly is not an allergy.  CURRENT MEDICATIONS: He takes Depakote, Lithium, Losartan, Carvedilol, and Lipitor. PHYSICAL EXAMINATION: Height is 5 feet 7 inches, weight 149 pounds, temperature is 96.6, blood  pressure is 120/80, pulse is 58, pain is 10/10. [name] on exam is alert, oriented x4, and answering all questions appropriately. Memory, language, attention span, and fund of knowledge is normal. Speech is clear, it is also fluent. Hearing is intact to voice. Uvula elevates in the midline. Shoulder shrug is normal. Tongue protrudes in the midline. Reflexes are 2 to 3+ in biceps, triceps, brachioradialis, knees, and ankles. Romberg is negative. Gait is normal. IMPRESSION/PLAN:.[name] returns today with the MRI of the cervical spine. What it shows is that he has foraminal narrowing on the right side at C4-5. I think this is the most likely reason for his discomfort. He is fused at C5-6, and I did go both anterior and posterior. This I would just plan on doing an anterior approach at C4-5.   On exam today, possibly some slight weakness in the right deltoid, maybe 5-/5, but he has no gross deficits. Strength is otherwise normal in the upper extremities of 5/5. IMPRESSION/PLAN: [name] expressed no desire whatsoever for the physical therapy, injections, or more time. He would like to proceed with surgery, because he says his pain is severe and keeping him from doing many of the things which he likes to do.

## 2017-01-26 NOTE — Anesthesia Postprocedure Evaluation (Signed)
Anesthesia Post Note  Patient: [name]  Procedure(s) Performed: Procedure(s) (LRB): ANTERIOR CERVICAL DECOMPRESSION/DISCECTOMY FUSION CERVICAL FOUR- CERVICAL FIVE (N/A)  Patient location during evaluation: PACU Anesthesia Type: General Level of consciousness: awake and alert, oriented and patient cooperative Pain management: pain level controlled Vital Signs Assessment: post-procedure vital signs reviewed and stable Respiratory status: spontaneous breathing, nonlabored ventilation, respiratory function stable and patient connected to nasal cannula oxygen Cardiovascular status: blood pressure returned to baseline and stable Postop Assessment: no signs of nausea or vomiting Anesthetic complications: no       Last Vitals:  Vitals:   01/26/17 1349 01/26/17 1410  BP: (!) 146/83 (!) 167/88  Pulse:  63  Resp:  18  Temp:  36.9 C    Last Pain:  Vitals:   01/26/17 1245  TempSrc:   PainSc: 6                  Rennie Hack,E. [place]

## 2017-01-26 NOTE — Discharge Summary (Signed)
Physician Discharge Summary  Patient ID: [name] MRN: [id number] DOB/AGE: 1961/11/13 56 y.o.  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 Kimball was admitted and taken to the operating room for an uncomplicated ACDF at [id number]. Post op he is tolerating a regular diet, voiding, and ambulating.  His wound is clean,dry and without signs of infection. Speaking voice is slightly hoarse, breathing comfortably.   Treatments: surgery:  Anterior Cervical decompression C4/5 Arthrodesis C4/5 with 55mm structural allograft Anterior instrumentation(Nuvasive) C4/5   Discharge Exam: Blood pressure (!) 166/88, pulse 63, temperature 98.2 F (36.8 C), resp. rate 18, height 5\' 7"  (1.702 m), weight 72.6 kg (160 lb), SpO2 95 %. General appearance: alert, cooperative and appears stated age Neurologic: Alert and oriented X 3, normal strength and tone. Normal symmetric reflexes. Normal coordination and gait  Disposition: 01-Home or Self Care OSTEOARTHRITIS OF SPINE WITH RADICULOPATHY, CERVICAL REGION  Allergies as of 01/26/2017   No Known Allergies     Medication List    TAKE these medications   aspirin EC 81 MG tablet Take 1 tablet (81 mg total) by mouth daily.   atorvastatin 20 MG tablet Commonly known as:  LIPITOR Take 1 tablet (20 mg total) by mouth daily.   carvedilol 6.25 MG tablet Commonly known as:  COREG Take 1 tablet (6.25 mg total) by mouth 2 (two) times daily with a meal.   lithium carbonate 300 MG capsule Take 300 mg by mouth 2 (two) times daily.   losartan 50 MG tablet Commonly known as:  COZAAR Take 1 tablet (50 mg total) by mouth daily.   oxyCODONE 5 MG immediate release tablet Commonly known as:  ROXICODONE Take 1 tablet  (5 mg total) by mouth every 6 (six) hours as needed for severe pain.   pantoprazole 40 MG tablet Commonly known as:  PROTONIX Take 40 mg by mouth daily before breakfast.   tiZANidine 4 MG tablet Commonly known as:  ZANAFLEX Take 1 tablet (4 mg total) by mouth every 6 (six) hours as needed for muscle spasms.        Signed: Giuliano Preece L 01/26/2017, 6:47 PM

## 2017-01-26 NOTE — Transfer of Care (Signed)
Immediate Anesthesia Transfer of Care Note  Patient: [name]  Procedure(s) Performed: Procedure(s) with comments: ANTERIOR CERVICAL DECOMPRESSION/DISCECTOMY FUSION CERVICAL FOUR- CERVICAL FIVE (N/A) - ANTERIOR CERVICAL DECOMPRESSION/DISCECTOMY FUSION CERVICAL FOUR- CERVICAL FIVE  Patient Location: PACU  Anesthesia Type:General  Level of Consciousness: awake, alert , oriented and sedated  Airway & Oxygen Therapy: Patient Spontanous Breathing and Patient connected to nasal cannula oxygen  Post-op Assessment: Report given to RN, Post -op Vital signs reviewed and stable and Patient moving all extremities  Post vital signs: Reviewed and stable  Last Vitals:  Vitals:   01/26/17 0652  BP: 134/64  Pulse: (!) 55  Resp: 20  Temp: 36.6 C    Last Pain:  Vitals:   01/26/17 0652  TempSrc: Oral      Patients Stated Pain Goal: 2 ([id number])  Complications: No apparent anesthesia complications

## 2017-01-26 NOTE — Anesthesia Procedure Notes (Signed)
Procedure Name: Intubation Date/Time: 01/26/2017 9:56 AM Performed by: Scheryl Darter Pre-anesthesia Checklist: Patient identified, Emergency Drugs available, Suction available and Patient being monitored Patient Re-evaluated:Patient Re-evaluated prior to inductionOxygen Delivery Method: Circle System Utilized Preoxygenation: Pre-oxygenation with 100% oxygen Intubation Type: IV induction Ventilation: Mask ventilation without difficulty Laryngoscope Size: Miller and 2 Grade View: Grade I Tube type: Oral Tube size: 7.5 mm Number of attempts: 1 Airway Equipment and Method: Stylet and Oral airway Placement Confirmation: ETT inserted through vocal cords under direct vision,  positive ETCO2 and breath sounds checked- equal and bilateral Secured at: 23 cm Tube secured with: Tape Dental Injury: Teeth and Oropharynx as per pre-operative assessment

## 2017-01-26 NOTE — Anesthesia Procedure Notes (Signed)
Arterial Line Insertion Start/End2/23/2018 9:22 AM, 01/26/2017 9:25 AM Performed by: Annye Asa, anesthesiologist  Preanesthetic checklist: patient identified, IV checked, risks and benefits discussed, surgical consent, monitors and equipment checked, pre-op evaluation, timeout performed and anesthesia consent Lidocaine 1% used for infiltration Right, radial was placed Catheter size: 20 Fr Hand hygiene performed  Allen's test indicative of satisfactory collateral circulation Attempts: 1 (after several atttempts by CRNAs) Procedure performed without using ultrasound guided technique. Following insertion, dressing applied. Post procedure assessment: normal  Patient tolerated the procedure well with no immediate complications.

## 2017-01-26 NOTE — Anesthesia Preprocedure Evaluation (Addendum)
Anesthesia Evaluation  Patient identified by MRN, date of birth, ID band Patient awake    Reviewed: Allergy & Precautions, NPO status , Patient's Chart, lab work & pertinent test results, reviewed documented beta blocker date and time   History of Anesthesia Complications Negative for: history of anesthetic complications  Airway Mallampati: I  TM Distance: >3 FB Neck ROM: Full    Dental  (+) Edentulous Upper, Edentulous Lower   Pulmonary Current Smoker,    breath sounds clear to auscultation       Cardiovascular hypertension, Pt. on medications and Pt. on home beta blockers (-) angina+ CAD, + Past MI, + Cardiac Stents and + Peripheral Vascular Disease   Rhythm:Regular Rate:Normal  '13 Cath: known occluded LCx, RCA stent w/ mild restenosis, LAD no significant dz, EF 35%;  '14 Cath: no significant change, EF 40%   Neuro/Psych Anxiety Depression Bipolar Disorder Schizophrenia negative neurological ROS     GI/Hepatic GERD  Medicated and Controlled,(+)     substance abuse  alcohol use, Hepatitis -, C  Endo/Other    Renal/GU negative Renal ROS     Musculoskeletal   Abdominal   Peds  Hematology   Anesthesia Other Findings   Reproductive/Obstetrics                            Anesthesia Physical Anesthesia Plan  ASA: III  Anesthesia Plan: General   Post-op Pain Management:    Induction: Intravenous  Airway Management Planned: Oral ETT  Additional Equipment: Arterial line  Intra-op Plan:   Post-operative Plan: Extubation in OR  Informed Consent: I have reviewed the patients History and Physical, chart, labs and discussed the procedure including the risks, benefits and alternatives for the proposed anesthesia with the patient or authorized representative who has indicated his/her understanding and acceptance.     Plan Discussed with: CRNA and Surgeon  Anesthesia Plan Comments:  (Plan routine monitors, A-line, GETA )        Anesthesia Quick Evaluation

## 2017-01-27 DIAGNOSIS — M4722 Other spondylosis with radiculopathy, cervical region: Secondary | ICD-10-CM | POA: Diagnosis not present

## 2017-01-27 MED ORDER — ONDANSETRON HCL 4 MG PO TABS
4.0000 mg | ORAL_TABLET | ORAL | Status: DC | PRN
  Administered 2017-01-27: 4 mg via ORAL
  Filled 2017-01-27: qty 1

## 2017-01-27 NOTE — Progress Notes (Signed)
Pt given D/C instructions with Rx's, verbal understanding was provided. Pt's IV was removed prior to D/C. Pt's incision is clean and dry with no sign of infection. Pt D/C'd home via wheelchair per MD order. Pt is stable @ D/C and has no other needs at this time. Alaycia Eardley, RN  

## 2017-01-29 ENCOUNTER — Encounter (HOSPITAL_COMMUNITY): Payer: Self-pay | Admitting: Neurosurgery

## 2017-03-15 ENCOUNTER — Encounter: Payer: Self-pay | Admitting: *Deleted

## 2017-04-14 ENCOUNTER — Other Ambulatory Visit: Payer: Self-pay | Admitting: Cardiovascular Disease

## 2017-05-31 ENCOUNTER — Ambulatory Visit (INDEPENDENT_AMBULATORY_CARE_PROVIDER_SITE_OTHER): Payer: Medicaid Other | Admitting: General Surgery

## 2017-05-31 ENCOUNTER — Encounter: Payer: Self-pay | Admitting: General Surgery

## 2017-05-31 VITALS — BP 152/98 | HR 64 | Resp 14 | Ht 67.0 in | Wt 162.0 lb

## 2017-05-31 DIAGNOSIS — R0781 Pleurodynia: Secondary | ICD-10-CM | POA: Diagnosis not present

## 2017-05-31 DIAGNOSIS — K824 Cholesterolosis of gallbladder: Secondary | ICD-10-CM | POA: Diagnosis not present

## 2017-05-31 NOTE — Progress Notes (Signed)
Patient ID: [name], male   DOB: 1961/04/09, 56 y.o.   MRN: [id number]  Chief Complaint  Patient presents with  . Follow-up    gall bladder polyp    HPI [name] is a 56 y.o. male.  Here for his 6 month follow up gall bladder polyp, no ultrasound due to insurance. He states he has constant right upper abdominal pain especially when he lays down. He states it ius right under that right lower rib area. Denies any nausea, diarrhea or vomiting.   HPI  Past Medical History:  Diagnosis Date  . Anxiety   . Bipolar affective ([place])    takes Lithium daily  . Broken fingers   . Coronary artery disease    a. s/p PCI to RCA at [facility]; b. s/p PCI to RCA '08; c. Cath 05/2012 showed known occluded LCx, RCA stent w/ mild restenosis, LAD no significant dz, EF 35%; d. Cardiac cath in July of 2014 showed no significant change.   . Depression   . GERD (gastroesophageal reflux disease)    takes Protonix daily  . Hepatitis C    "diagnosed in the past 2 wk" (10/22/2012)  . History of ETOH abuse    quit 2007  . Hyperlipidemia   . Hypertension    "pt. denies - no longer taking blood pressure medications"  . Myocardial infarction ([place]) 1996; 1997; 2000's   "total of 3" (10/22/2012)  . PAD (peripheral artery disease) ([place])    2010 PTA & stent to left CIA & left SFA; 05/2012 PTA & stent to distal LCIA; 10/2012 thrombectomy and stents to [place]  . Schizophrenia ([place])   . Seizures ([place])    "when I get anxious" (10/22/2012)  . Tobacco abuse   . Urinary frequency     Past Surgical History:  Procedure Laterality Date  . ABDOMINAL AORTAGRAM N/A 10/22/2012   Procedure: ABDOMINAL Maxcine Ham;  Surgeon: Wellington Hampshire, MD;  Location: [place] CATH LAB;  Service: Cardiovascular;  Laterality: N/A;  . ABDOMINAL AORTAGRAM N/A 11/11/2014   Procedure: ABDOMINAL Maxcine Ham;  Surgeon: Wellington Hampshire, MD;  Location: [place] CATH LAB;  Service: Cardiovascular;  Laterality: N/A;  . ANTERIOR CERVICAL  DECOMP/DISCECTOMY FUSION  01/2011   plate & screw placement   . ANTERIOR CERVICAL DECOMP/DISCECTOMY FUSION N/A 01/26/2017   Procedure: ANTERIOR CERVICAL DECOMPRESSION/DISCECTOMY FUSION CERVICAL FOUR- CERVICAL FIVE;  Surgeon: Ashok Pall, MD;  Location: [place];  Service: Neurosurgery;  Laterality: N/A;  ANTERIOR CERVICAL DECOMPRESSION/DISCECTOMY FUSION CERVICAL FOUR- CERVICAL FIVE  . AORTA - BILATERAL FEMORAL ARTERY BYPASS GRAFT N/A 11/20/2014   Procedure: AORTA BIFEMORAL BYPASS GRAFT;  Surgeon: Mal Misty, MD;  Location: [place];  Service: Vascular;  Laterality: N/A;  . BRAIN SURGERY     subdural hematoma  . CARDIAC CATHETERIZATION     Stent - 12  . CORONARY ANGIOPLASTY WITH STENT PLACEMENT  03/2007   to RCA  . ILIAC ARTERY STENT  10/22/2012   "2" (10/22/2012)  . ILIAC ARTERY STENT  06/2012   left  . INCISIONAL HERNIA REPAIR N/A 11/08/2015   Procedure: INCISIONAL HERNIA REPAIR WITH MYOFASCIAL RELEASE;  Surgeon: Rolm Bookbinder, MD;  Location: [place];  Service: General;  Laterality: N/A;  . INSERTION OF MESH N/A 11/08/2015   Procedure: INSERTION OF MESH;  Surgeon: Rolm Bookbinder, MD;  Location: [place];  Service: General;  Laterality: N/A;  . LEFT HEART CATHETERIZATION WITH CORONARY ANGIOGRAM N/A 05/29/2012   Procedure: LEFT HEART CATHETERIZATION WITH CORONARY  ANGIOGRAM;  Surgeon: Wellington Hampshire, MD;  Location: [facility] CATH LAB;  Service: Cardiovascular;  Laterality: N/A;  . LIVER BIOPSY  10/2012  . LOWER EXTREMITY ANGIOGRAM N/A 05/29/2012   Procedure: LOWER EXTREMITY ANGIOGRAM;  Surgeon: Wellington Hampshire, MD;  Location: [place] CATH LAB;  Service: Cardiovascular;  Laterality: N/A;  . PERCUTANEOUS STENT INTERVENTION Left 05/29/2012   Procedure: PERCUTANEOUS STENT INTERVENTION;  Surgeon: Wellington Hampshire, MD;  Location: [place] CATH LAB;  Service: Cardiovascular;  Laterality: Left;  . POSTERIOR FUSION CERVICAL SPINE  2012  . SUBDURAL HEMATOMA EVACUATION VIA CRANIOTOMY      Family History  Problem  Relation Age of Onset  . Emphysema Father   . COPD Mother   . Diabetes Other   . Alcohol abuse Other   . Hypertension Other   . Hyperlipidemia Other   . Seizures Other     Social History Social History  Substance Use Topics  . Smoking status: Current Every Day Smoker    Packs/day: 1.00    Years: 40.00    Types: Cigarettes  . Smokeless tobacco: Never Used  . Alcohol use 0.0 oz/week     Comment: occasional    No Known Allergies  Current Outpatient Prescriptions  Medication Sig Dispense Refill  . ALPRAZolam (XANAX) 1 MG tablet TAKE 1 TABLET BY MOUTH 3 TIMES A DAY AS NEEDED FOR PANIOC ATTACKS  2  . atorvastatin (LIPITOR) 20 MG tablet TAKE 1 TABLET (20 MG TOTAL) BY MOUTH DAILY. 30 tablet 3  . buPROPion (WELLBUTRIN XL) 150 MG 24 hr tablet Take 150 mg by mouth every morning.  2  . carvedilol (COREG) 6.25 MG tablet TAKE 1 TABLET (6.25 MG TOTAL) BY MOUTH 2 (TWO) TIMES DAILY WITH A MEAL. 60 tablet 3  . divalproex (DEPAKOTE) 500 MG DR tablet Take 500 mg by mouth 3 (three) times daily.    Marland Kitchen lithium carbonate 300 MG capsule Take 300 mg by mouth 2 (two) times daily.    Marland Kitchen losartan (COZAAR) 50 MG tablet TAKE 1 TABLET (50 MG TOTAL) BY MOUTH DAILY. 30 tablet 3  . REXULTI 3 MG TABS TALE 1 TABLET BY MOUTH DAILY  2   No current facility-administered medications for this visit.     Review of Systems Review of Systems  Constitutional: Negative.   Respiratory: Negative.   Cardiovascular: Negative.   Gastrointestinal: Positive for abdominal pain.    Blood pressure (!) 152/98, pulse 64, resp. rate 14, height 5\' 7"  (1.702 m), weight 162 lb (73.5 kg).  Physical Exam Physical Exam  Constitutional: He is oriented to person, place, and time. He appears well-developed and well-nourished.  HENT:  Mouth/Throat: Oropharynx is clear and moist.  Eyes: Conjunctivae are normal. No scleral icterus.  Neck: Neck supple.  Cardiovascular: Normal rate, regular rhythm and normal heart sounds.    Pulmonary/Chest: Effort normal and breath sounds normal.  Abdominal: Soft. Normal appearance and bowel sounds are normal. There is tenderness in the right upper quadrant.    Lymphadenopathy:    He has no cervical adenopathy.  Neurological: He is alert and oriented to person, place, and time.  Skin: Skin is warm and dry.  Psychiatric: His behavior is normal.    Data Reviewed Previous records. CT of the abdomen and pelvis dated 07/06/2016.  Assessment    Chronic right upper quadrant pain post component separation ventral hernia repair.  Ultrasound evidence of a large gallbladder polyp with normal HIDA scan.    Plan    We'll recheck the  ultrasound to see if this polyp is indeed real or was "sludge". If it is still present he would be a candidate for resection based on its size. The possibility for resection of the mesh pushed under the costal margin at the time of his component separation hernia repair was reviewed.  Anticipated difficulty in light of his previous surgery was discussed.  Possibility of open repair reviewed.    Schedule gall bladder ultrasound Consider surgery   HPI, Physical Exam, Assessment and Plan have been scribed under the direction and in the presence of Robert Bellow, MD. Karie Fetch, RN  I have completed the exam and reviewed the above documentation for accuracy and completeness.  I agree with the above.  Haematologist has been used and any errors in dictation or transcription are unintentional.  Hervey Ard, M.D., F.A.C.S.  Robert Bellow 06/02/2017, 10:52 AM   Patient has been scheduled for a gallbladder ultrasound at [facility] for 06-08-17 at 8:45 am (arrive 8:30 am). Prep: NPO after midnight. This patient is aware of date, time, and instructions. He verbalizes understanding.   This patient will be contacted once results are available.  Dominga Ferry, CMA

## 2017-05-31 NOTE — Patient Instructions (Signed)
The patient is aware to call back for any questions or concerns.  

## 2017-06-08 ENCOUNTER — Telehealth: Payer: Self-pay

## 2017-06-08 ENCOUNTER — Ambulatory Visit
Admission: RE | Admit: 2017-06-08 | Discharge: 2017-06-08 | Disposition: A | Discharge status: Home / Self Care | Payer: Medicaid Other | Source: Ambulatory Visit | Attending: General Surgery | Admitting: General Surgery

## 2017-06-08 DIAGNOSIS — K824 Cholesterolosis of gallbladder: Secondary | ICD-10-CM | POA: Diagnosis not present

## 2017-06-08 NOTE — Telephone Encounter (Signed)
-----   Message from Robert Bellow, MD sent at 06/08/2017  9:15 AM EDT ----- Notify the patient that polyp is still there.  Need to discuss gallbladder removal, can look at dates to schedule, but he will need a pre-op appointment.   ----- Message ----- From: Interface, Rad Results In Sent: 06/08/2017   9:03 AM To: Robert Bellow, MD

## 2017-06-08 NOTE — Telephone Encounter (Signed)
Notified patient as instructed, patient pleased. Discussed follow-up appointments, patient agrees. Will follow up on 06/11/17 to discuss scheduling surgery. Would like to have done on 06/29/17.

## 2017-06-11 ENCOUNTER — Encounter: Payer: Self-pay | Admitting: General Surgery

## 2017-06-11 ENCOUNTER — Ambulatory Visit (INDEPENDENT_AMBULATORY_CARE_PROVIDER_SITE_OTHER): Payer: Medicaid Other | Admitting: General Surgery

## 2017-06-11 VITALS — BP 132/72 | HR 76 | Resp 12 | Ht 67.0 in | Wt 160.0 lb

## 2017-06-11 DIAGNOSIS — R1011 Right upper quadrant pain: Secondary | ICD-10-CM

## 2017-06-11 DIAGNOSIS — K824 Cholesterolosis of gallbladder: Secondary | ICD-10-CM

## 2017-06-11 NOTE — Progress Notes (Signed)
Patient ID: REIN POPOV, male   DOB: 07/02/1961, 56 y.o.   MRN: [id number]  Chief Complaint  Patient presents with  . Other    Pre Op    HPI [name] is a 56 y.o. male here today for his pre opo gallbladder surgery scheduled on 06/29/2017.  Recent ultrasound dated 06/08/2017 confirmed a 1.2 cm polypoid lesion in the gallbladder. Not previously confirmed on CT imaging. Normal gallbladder function on HIDA scan.  Previous CT dated 07/06/2016 had suggested an opacity in the right lung base for which a follow-up CT at 6-12 months was recommended. This will be scheduled in the future.  The patient continues to have pain in the right rib cage, present since his ventral hernia repair with component separation in 2016.  HPI  Past Medical History:  Diagnosis Date  . Anxiety   . Bipolar affective ([place])    takes Lithium daily  . Broken fingers   . Coronary artery disease    a. s/p PCI to RCA at [facility]; b. s/p PCI to RCA '08; c. Cath 05/2012 showed known occluded LCx, RCA stent w/ mild restenosis, LAD no significant dz, EF 35%; d. Cardiac cath in July of 2014 showed no significant change.   . Depression   . GERD (gastroesophageal reflux disease)    takes Protonix daily  . Hepatitis C    "diagnosed in the past 2 wk" (10/22/2012)  . History of ETOH abuse    quit 2007  . Hyperlipidemia   . Hypertension    "pt. denies - no longer taking blood pressure medications"  . Myocardial infarction ([place]) 1996; 1997; 2000's   "total of 3" (10/22/2012)  . PAD (peripheral artery disease) ([place])    2010 PTA & stent to left CIA & left SFA; 05/2012 PTA & stent to distal LCIA; 10/2012 thrombectomy and stents to [place]  . Schizophrenia ([place])   . Seizures ([place])    "when I get anxious" (10/22/2012)  . Tobacco abuse   . Urinary frequency     Past Surgical History:  Procedure Laterality Date  . ABDOMINAL AORTAGRAM N/A 10/22/2012   Procedure: ABDOMINAL Maxcine Ham;  Surgeon: Wellington Hampshire, MD;  Location: [place] CATH LAB;  Service: Cardiovascular;  Laterality: N/A;  . ABDOMINAL AORTAGRAM N/A 11/11/2014   Procedure: ABDOMINAL Maxcine Ham;  Surgeon: Wellington Hampshire, MD;  Location: [place] CATH [facility];  Service: Cardiovascular;  Laterality: N/A;  . ANTERIOR CERVICAL DECOMP/DISCECTOMY FUSION  01/2011   plate & screw placement   . ANTERIOR CERVICAL DECOMP/DISCECTOMY FUSION N/A 01/26/2017   Procedure: ANTERIOR CERVICAL DECOMPRESSION/DISCECTOMY FUSION CERVICAL FOUR- CERVICAL FIVE;  Surgeon: Ashok Pall, MD;  Location: [place];  Service: Neurosurgery;  Laterality: N/A;  ANTERIOR CERVICAL DECOMPRESSION/DISCECTOMY FUSION CERVICAL FOUR- CERVICAL FIVE  . AORTA - BILATERAL FEMORAL ARTERY BYPASS GRAFT N/A 11/20/2014   Procedure: AORTA BIFEMORAL BYPASS GRAFT;  Surgeon: Mal Misty, MD;  Location: [place];  Service: Vascular;  Laterality: N/A;  . BRAIN SURGERY     subdural hematoma  . CARDIAC CATHETERIZATION     Stent - 12  . CORONARY ANGIOPLASTY WITH STENT PLACEMENT  03/2007   to RCA  . ILIAC ARTERY STENT  10/22/2012   "2" (10/22/2012)  . ILIAC ARTERY STENT  06/2012   left  . INCISIONAL HERNIA REPAIR N/A 11/08/2015   Procedure: INCISIONAL HERNIA REPAIR WITH MYOFASCIAL RELEASE;  Surgeon: Rolm Bookbinder, MD;  Location: [place];  Service: General;  Laterality: N/A;  . INSERTION OF  MESH N/A 11/08/2015   Procedure: INSERTION OF MESH;  Surgeon: Rolm Bookbinder, MD;  Location: [place];  Service: General;  Laterality: N/A;  . LEFT HEART CATHETERIZATION WITH CORONARY ANGIOGRAM N/A 05/29/2012   Procedure: LEFT HEART CATHETERIZATION WITH CORONARY ANGIOGRAM;  Surgeon: Wellington Hampshire, MD;  Location: [place] CATH LAB;  Service: Cardiovascular;  Laterality: N/A;  . LIVER BIOPSY  10/2012  . LOWER EXTREMITY ANGIOGRAM N/A 05/29/2012   Procedure: LOWER EXTREMITY ANGIOGRAM;  Surgeon: Wellington Hampshire, MD;  Location: [place] CATH LAB;  Service: Cardiovascular;  Laterality: N/A;  . PERCUTANEOUS STENT INTERVENTION Left 05/29/2012    Procedure: PERCUTANEOUS STENT INTERVENTION;  Surgeon: Wellington Hampshire, MD;  Location: [place] CATH LAB;  Service: Cardiovascular;  Laterality: Left;  . POSTERIOR FUSION CERVICAL SPINE  2012  . SUBDURAL HEMATOMA EVACUATION VIA CRANIOTOMY      Family History  Problem Relation Age of Onset  . Emphysema Father   . COPD Mother   . Diabetes Other   . Alcohol abuse Other   . Hypertension Other   . Hyperlipidemia Other   . Seizures Other     Social History Social History  Substance Use Topics  . Smoking status: Current Every Day Smoker    Packs/day: 1.00    Years: 40.00    Types: Cigarettes  . Smokeless tobacco: Never Used  . Alcohol use 0.0 oz/week     Comment: occasional    No Known Allergies  Current Outpatient Prescriptions  Medication Sig Dispense Refill  . ALPRAZolam (XANAX) 1 MG tablet TAKE 1 TABLET BY MOUTH 3 TIMES A DAY AS NEEDED FOR PANIOC ATTACKS  2  . atorvastatin (LIPITOR) 20 MG tablet TAKE 1 TABLET (20 MG TOTAL) BY MOUTH DAILY. 30 tablet 3  . buPROPion (WELLBUTRIN XL) 150 MG 24 hr tablet Take 150 mg by mouth every morning.  2  . carvedilol (COREG) 6.25 MG tablet TAKE 1 TABLET (6.25 MG TOTAL) BY MOUTH 2 (TWO) TIMES DAILY WITH A MEAL. 60 tablet 3  . divalproex (DEPAKOTE) 500 MG DR tablet Take 500 mg by mouth 3 (three) times daily.    Marland Kitchen lithium carbonate 300 MG capsule Take 300 mg by mouth 2 (two) times daily.    Marland Kitchen losartan (COZAAR) 50 MG tablet TAKE 1 TABLET (50 MG TOTAL) BY MOUTH DAILY. 30 tablet 3  . REXULTI 3 MG TABS TALE 1 TABLET BY MOUTH DAILY  2   No current facility-administered medications for this visit.     Review of Systems Review of Systems  Constitutional: Negative.   Respiratory: Negative.   Cardiovascular: Negative.     Blood pressure 132/72, pulse 76, resp. rate 12, height 5\' 7"  (1.702 m), weight 160 lb (72.6 kg).  Physical Exam Physical Exam  Constitutional: He is oriented to person, place, and time. He appears well-developed and well-nourished.   Cardiovascular: Normal rate, regular rhythm and normal heart sounds.   Pulmonary/Chest: Effort normal and breath sounds normal.  Abdominal: Soft. Normal appearance and bowel sounds are normal. There is no hepatomegaly. There is tenderness in the right upper quadrant.  Neurological: He is alert and oriented to person, place, and time.  Skin: Skin is warm and dry.    Data Reviewed See summary above. Laboratory studies dated 12/27/2016 showed normal liver function studies. Total bilirubin 1.0, creatinine 0.9. Sodium 139. Normal transaminases. CBC of the same date showed a hemoglobin of 15.6 with an MCV of 96 and a white blood cell count 5900. Lately count of 151,000. CBC  of 01/26/2017 showed a platelet count of 103,000.  Assessment    History of hepatitis C diagnosed in 2013.  Chronic right upper quadrant pain in part likely secondary to mesh placement under the costal margin of the time of his ventral hernia repair.  Persistent gallbladder filling defect above 10 mm size for excision.   Plan    We had a long discussion of expectations and possible courses that surgery may undertake. The patient has had to use significant surgical interventions in the abdomen 1) aortic grafting and 2 component separation after the patient developed a ventral hernia. While a laparoscopic procedure would be planned, the possibility of an open procedure was discussed. The pain the patient is experienced in the right rib cage area may be from a transfixion suture from his mesh placement and an attempt will be made to remove the corner of mesh tucked up under the rib cage if this can be safely accomplished.  No guarantee was provided that the patient's pain will resolve post procedure.    Review of the August 2017 CT scan of the abdomen and pelvis suggested safe laparoscopic access will be obtainable in the left upper quadrant. Depending upon adhesive disease an open procedure may be appropriate. Visualization  of the mesh and removal of any transfixing sutures may require an incision even if the biliary portion of the procedure is completed laparoscopically.   The per report from his ventral hernia repair dated 11/08/2015 described an 11 mm defect up to the xiphoid. Dr. Donne Hazel describes a 35 cm piece of mesh (ventral light ST) being placed in a retrorectus position after myofascial release bilaterally. 5-0 Prolene sutures reportedly placed superiorly. These are not described as going through the costal margin.   Laparoscopic Cholecystectomy with Intraoperative Cholangiogram. The procedure, including it's potential risks and complications (including but not limited to infection, bleeding, injury to intra-abdominal organs or bile ducts, bile leak, poor cosmetic result, sepsis and death) were discussed with the patient in detail. Non-operative options, including their inherent risks (acute calculous cholecystitis with possible choledocholithiasis or gallstone pancreatitis, with the risk of ascending cholangitis, sepsis, and death) were discussed as well. The patient expressed and understanding of what we discussed and wishes to proceed with laparoscopic cholecystectomy. The patient further understands that if it is technically not possible, or it is unsafe to proceed laparoscopically, that I will convert to an open cholecystectomy.  HPI, Physical Exam, Assessment and Plan have been scribed under the direction and in the presence of Hervey Ard, MD.  Gaspar Cola, CMA  I have completed the exam and reviewed the above documentation for accuracy and completeness.  I agree with the above.  Haematologist has been used and any errors in dictation or transcription are unintentional.  Hervey Ard, M.D., F.A.C.S.  [name] 06/11/2017, 9:57 PM  Patient's surgery has been scheduled for 06-29-17 at [facility].   Dominga Ferry, CMA

## 2017-06-11 NOTE — Patient Instructions (Signed)

## 2017-06-12 ENCOUNTER — Telehealth: Payer: Self-pay

## 2017-06-12 NOTE — Telephone Encounter (Signed)
Patient notified of pre admit testing appointment. He will arrive there on 06/22/17 at 1:00 pm. Patient is aware of date, time, and location.

## 2017-06-22 ENCOUNTER — Encounter
Admission: RE | Admit: 2017-06-22 | Discharge: 2017-06-22 | Disposition: A | Discharge status: Home / Self Care | Payer: Medicaid Other | Source: Ambulatory Visit | Attending: General Surgery | Admitting: General Surgery

## 2017-06-22 ENCOUNTER — Other Ambulatory Visit: Payer: Self-pay

## 2017-06-22 DIAGNOSIS — Z01812 Encounter for preprocedural laboratory examination: Secondary | ICD-10-CM | POA: Insufficient documentation

## 2017-06-22 LAB — CBC WITH DIFFERENTIAL/PLATELET
Basophils Absolute: 0.1 10*3/uL (ref 0–0.1)
Basophils Relative: 1 %
Eosinophils Absolute: 0.4 10*3/uL (ref 0–0.7)
Eosinophils Relative: 6 %
HCT: 45.1 % (ref 40.0–52.0)
Hemoglobin: 15.4 g/dL (ref 13.0–18.0)
Lymphocytes Relative: 24 %
Lymphs Abs: 1.7 10*3/uL (ref 1.0–3.6)
MCH: 33.6 pg (ref 26.0–34.0)
MCHC: 34.2 g/dL (ref 32.0–36.0)
MCV: 98.2 fL (ref 80.0–100.0)
Monocytes Absolute: 0.7 10*3/uL (ref 0.2–1.0)
Monocytes Relative: 10 %
Neutro Abs: 4.2 10*3/uL (ref 1.4–6.5)
Neutrophils Relative %: 59 %
Platelets: 134 10*3/uL — ABNORMAL LOW (ref 150–440)
RBC: 4.59 MIL/uL (ref 4.40–5.90)
RDW: 13.6 % (ref 11.5–14.5)
WBC: 7 10*3/uL (ref 3.8–10.6)

## 2017-06-22 LAB — PROTIME-INR
INR: 1
Prothrombin Time: 13.2 seconds (ref 11.4–15.2)

## 2017-06-22 NOTE — Patient Instructions (Signed)
  Your procedure is scheduled on: June 29, 2017 (FRIDAY) Report to Same Day Surgery 2nd floor medical mall ([place] Entrance-take elevator on left to 2nd floor.  Check in with surgery information desk.) To find out your arrival time please call [phone] between 1PM - 3PM on June 28, 2017 (THURSDAY)  Remember: Instructions that are not followed completely may result in serious medical risk, up to and including death, or upon the discretion of your surgeon and anesthesiologist your surgery may need to be rescheduled.    _x___ 1. Do not eat food or drink liquids after midnight. No gum chewing or  hard candies                              __x__ 2. No Alcohol for 24 hours before or after surgery.   __x__3. No Smoking for 24 prior to surgery.   ____  4. Bring all medications with you on the day of surgery if instructed.    __x__ 5. Notify your doctor if there is any change in your medical condition     (cold, fever, infections).     Do not wear jewelry, make-up, hairpins, clips or nail polish.  Do not wear lotions, powders, or perfumes.    Do not shave 48 hours prior to surgery. Men may shave face and neck.  Do not bring valuables to the hospital.    [facility] is not responsible for any belongings or valuables.               Contacts, dentures or bridgework may not be worn into surgery.  Leave your suitcase in the car. After surgery it may be brought to your room.  For patients admitted to the hospital, discharge time is determined by your  treatment team                     Patients discharged the day of surgery will not be allowed to drive home.  You will need someone to drive you home and stay with you the night of your procedure.    Please read over the following fact sheets that you were given:   [facility] Preparing for Surgery and or MRSA Information   _x___ Take the following medications the morning of surgery with a sip of water :  1.  PANTOPRAZOLE  2.   ALPRAZOLAM  3.  DIVALPROEX  4.  CARVEDILOL  5.  LITHIUM  6.  BUPROPION 7. ATORVASTATIN 8.  LOSARTAN 9. REXULTI  ____Fleets enema or Magnesium Citrate as directed.   _x___ Use CHG Soap or sage wipes as directed on instruction sheet   ____ Use inhalers on the day of surgery and bring to hospital day of surgery  ____ Stop Metformin and Janumet 2 days prior to surgery.    ____ Take 1/2 of usual insulin dose the night before surgery and none on the morning surgery      _x___ Follow recommendations from Cardiologist, Pulmonologist or PCP regarding          stopping Aspirin, Coumadin, Plavix ,Eliquis, Effient, or Pradaxa, and Pletal.  X____Stop Anti-inflammatories such as Advil, Aleve, Ibuprofen, Motrin, Naproxen, Naprosyn, Goodies powders or aspirin products. OK to take Tylenol    _x___ Stop supplements until after surgery.  But may continue Vitamin D, Vitamin B, and multivitamin        ____ Bring C-Pap to the hospital.

## 2017-06-28 MED ORDER — CEFAZOLIN SODIUM-DEXTROSE 2-4 GM/100ML-% IV SOLN
2.0000 g | INTRAVENOUS | Status: AC
  Administered 2017-06-29: 2 g via INTRAVENOUS

## 2017-06-29 ENCOUNTER — Ambulatory Visit: Payer: Medicaid Other | Admitting: Certified Registered Nurse Anesthetist

## 2017-06-29 ENCOUNTER — Encounter
Admission: RE | Disposition: A | Discharge status: Home / Self Care | Payer: Self-pay | Source: Ambulatory Visit | Attending: General Surgery

## 2017-06-29 ENCOUNTER — Observation Stay
Admission: RE | Admit: 2017-06-29 | Discharge: 2017-06-30 | Disposition: A | Discharge status: Home / Self Care | Payer: Medicaid Other | Source: Ambulatory Visit | Attending: General Surgery | Admitting: General Surgery

## 2017-06-29 ENCOUNTER — Ambulatory Visit: Payer: Medicaid Other

## 2017-06-29 ENCOUNTER — Encounter: Payer: Self-pay | Admitting: *Deleted

## 2017-06-29 DIAGNOSIS — K219 Gastro-esophageal reflux disease without esophagitis: Secondary | ICD-10-CM | POA: Insufficient documentation

## 2017-06-29 DIAGNOSIS — K8020 Calculus of gallbladder without cholecystitis without obstruction: Secondary | ICD-10-CM

## 2017-06-29 DIAGNOSIS — I739 Peripheral vascular disease, unspecified: Secondary | ICD-10-CM | POA: Insufficient documentation

## 2017-06-29 DIAGNOSIS — Z79899 Other long term (current) drug therapy: Secondary | ICD-10-CM | POA: Insufficient documentation

## 2017-06-29 DIAGNOSIS — I10 Essential (primary) hypertension: Secondary | ICD-10-CM | POA: Diagnosis not present

## 2017-06-29 DIAGNOSIS — K819 Cholecystitis, unspecified: Secondary | ICD-10-CM | POA: Diagnosis present

## 2017-06-29 DIAGNOSIS — F209 Schizophrenia, unspecified: Secondary | ICD-10-CM | POA: Insufficient documentation

## 2017-06-29 DIAGNOSIS — I252 Old myocardial infarction: Secondary | ICD-10-CM | POA: Insufficient documentation

## 2017-06-29 DIAGNOSIS — F17200 Nicotine dependence, unspecified, uncomplicated: Secondary | ICD-10-CM | POA: Insufficient documentation

## 2017-06-29 DIAGNOSIS — I2510 Atherosclerotic heart disease of native coronary artery without angina pectoris: Secondary | ICD-10-CM | POA: Insufficient documentation

## 2017-06-29 DIAGNOSIS — K811 Chronic cholecystitis: Secondary | ICD-10-CM | POA: Insufficient documentation

## 2017-06-29 DIAGNOSIS — K824 Cholesterolosis of gallbladder: Principal | ICD-10-CM | POA: Insufficient documentation

## 2017-06-29 DIAGNOSIS — K8044 Calculus of bile duct with chronic cholecystitis without obstruction: Secondary | ICD-10-CM | POA: Diagnosis not present

## 2017-06-29 DIAGNOSIS — Z955 Presence of coronary angioplasty implant and graft: Secondary | ICD-10-CM | POA: Diagnosis not present

## 2017-06-29 DIAGNOSIS — F319 Bipolar disorder, unspecified: Secondary | ICD-10-CM | POA: Insufficient documentation

## 2017-06-29 DIAGNOSIS — F419 Anxiety disorder, unspecified: Secondary | ICD-10-CM | POA: Insufficient documentation

## 2017-06-29 HISTORY — PX: CHOLECYSTECTOMY: SHX55

## 2017-06-29 LAB — CREATININE, SERUM
Creatinine, Ser: 1.51 mg/dL — ABNORMAL HIGH (ref 0.61–1.24)
GFR calc Af Amer: 57 mL/min — ABNORMAL LOW (ref >60)
GFR calc non Af Amer: 50 mL/min — ABNORMAL LOW (ref >60)

## 2017-06-29 SURGERY — LAPAROSCOPIC CHOLECYSTECTOMY WITH INTRAOPERATIVE CHOLANGIOGRAM
Anesthesia: General | Site: Abdomen | Wound class: Clean Contaminated

## 2017-06-29 MED ORDER — ACETAMINOPHEN 10 MG/ML IV SOLN
INTRAVENOUS | Status: DC | PRN
  Administered 2017-06-29: 1000 mg via INTRAVENOUS

## 2017-06-29 MED ORDER — IOTHALAMATE MEGLUMINE 60 % INJ SOLN
INTRAMUSCULAR | Status: DC | PRN
  Administered 2017-06-29: 15 mL

## 2017-06-29 MED ORDER — LIDOCAINE HCL (PF) 2 % IJ SOLN
INTRAMUSCULAR | Status: AC
  Filled 2017-06-29: qty 2

## 2017-06-29 MED ORDER — BUPIVACAINE-EPINEPHRINE (PF) 0.5% -1:200000 IJ SOLN
INTRAMUSCULAR | Status: DC | PRN
  Administered 2017-06-29: 30 mL

## 2017-06-29 MED ORDER — LOSARTAN POTASSIUM 50 MG PO TABS
50.0000 mg | ORAL_TABLET | Freq: Every day | ORAL | Status: DC
  Administered 2017-06-30: 50 mg via ORAL
  Filled 2017-06-29: qty 1

## 2017-06-29 MED ORDER — FENTANYL CITRATE (PF) 100 MCG/2ML IJ SOLN
INTRAMUSCULAR | Status: AC
  Administered 2017-06-29: 25 ug via INTRAVENOUS
  Filled 2017-06-29: qty 2

## 2017-06-29 MED ORDER — DIVALPROEX SODIUM ER 500 MG PO TB24
1500.0000 mg | ORAL_TABLET | Freq: Every day | ORAL | Status: DC
  Administered 2017-06-29: 1500 mg via ORAL
  Filled 2017-06-29 (×2): qty 3

## 2017-06-29 MED ORDER — HYDROMORPHONE HCL 1 MG/ML IJ SOLN
INTRAMUSCULAR | Status: DC | PRN
  Administered 2017-06-29: 1 mg via INTRAVENOUS

## 2017-06-29 MED ORDER — EPHEDRINE SULFATE 50 MG/ML IJ SOLN
INTRAMUSCULAR | Status: AC
  Filled 2017-06-29: qty 1

## 2017-06-29 MED ORDER — PROMETHAZINE HCL 25 MG/ML IJ SOLN: 12.5000 mg | INTRAMUSCULAR | Status: DC | PRN

## 2017-06-29 MED ORDER — EPHEDRINE SULFATE 50 MG/ML IJ SOLN
INTRAMUSCULAR | Status: DC | PRN
  Administered 2017-06-29: 10 mg via INTRAVENOUS
  Administered 2017-06-29 (×2): 5 mg via INTRAVENOUS

## 2017-06-29 MED ORDER — BUPIVACAINE-EPINEPHRINE (PF) 0.5% -1:200000 IJ SOLN
INTRAMUSCULAR | Status: AC
  Filled 2017-06-29: qty 30

## 2017-06-29 MED ORDER — LACTATED RINGERS IV SOLN
INTRAVENOUS | Status: DC
  Administered 2017-06-29: 13:00:00 via INTRAVENOUS

## 2017-06-29 MED ORDER — LABETALOL HCL 5 MG/ML IV SOLN
5.0000 mg | INTRAVENOUS | Status: AC | PRN
  Administered 2017-06-29 (×2): 5 mg via INTRAVENOUS

## 2017-06-29 MED ORDER — SUGAMMADEX SODIUM 200 MG/2ML IV SOLN
INTRAVENOUS | Status: AC
  Filled 2017-06-29: qty 2

## 2017-06-29 MED ORDER — ENOXAPARIN SODIUM 40 MG/0.4ML ~~LOC~~ SOLN
40.0000 mg | SUBCUTANEOUS | Status: DC
  Administered 2017-06-30: 40 mg via SUBCUTANEOUS
  Filled 2017-06-29: qty 0.4

## 2017-06-29 MED ORDER — ACETAMINOPHEN 325 MG PO TABS: 650.0000 mg | ORAL_TABLET | ORAL | Status: DC | PRN

## 2017-06-29 MED ORDER — BREXPIPRAZOLE 3 MG PO TABS
3.0000 mg | ORAL_TABLET | Freq: Every day | ORAL | Status: DC
  Filled 2017-06-29: qty 1

## 2017-06-29 MED ORDER — MIDAZOLAM HCL 2 MG/2ML IJ SOLN
INTRAMUSCULAR | Status: AC
  Filled 2017-06-29: qty 2

## 2017-06-29 MED ORDER — CEFAZOLIN SODIUM-DEXTROSE 2-4 GM/100ML-% IV SOLN
INTRAVENOUS | Status: AC
  Filled 2017-06-29: qty 100

## 2017-06-29 MED ORDER — HYDROMORPHONE HCL 1 MG/ML IJ SOLN
INTRAMUSCULAR | Status: AC
  Administered 2017-06-29: 0.5 mg via INTRAVENOUS
  Filled 2017-06-29: qty 1

## 2017-06-29 MED ORDER — BREXPIPRAZOLE 1 MG PO TABS
3.0000 mg | ORAL_TABLET | Freq: Every day | ORAL | Status: DC
  Administered 2017-06-29 – 2017-06-30 (×2): 3 mg via ORAL
  Filled 2017-06-29 (×2): qty 3

## 2017-06-29 MED ORDER — DEXAMETHASONE SODIUM PHOSPHATE 10 MG/ML IJ SOLN
INTRAMUSCULAR | Status: DC | PRN
  Administered 2017-06-29: 10 mg via INTRAVENOUS

## 2017-06-29 MED ORDER — MIDAZOLAM HCL 2 MG/2ML IJ SOLN
INTRAMUSCULAR | Status: DC | PRN
  Administered 2017-06-29: 2 mg via INTRAVENOUS

## 2017-06-29 MED ORDER — SUGAMMADEX SODIUM 200 MG/2ML IV SOLN
INTRAVENOUS | Status: DC | PRN
  Administered 2017-06-29: 150 mg via INTRAVENOUS

## 2017-06-29 MED ORDER — ACETAMINOPHEN 10 MG/ML IV SOLN
INTRAVENOUS | Status: AC
  Filled 2017-06-29: qty 100

## 2017-06-29 MED ORDER — BUPROPION HCL ER (XL) 150 MG PO TB24
150.0000 mg | ORAL_TABLET | Freq: Every morning | ORAL | Status: DC
  Administered 2017-06-30: 150 mg via ORAL
  Filled 2017-06-29: qty 1

## 2017-06-29 MED ORDER — FENTANYL CITRATE (PF) 250 MCG/5ML IJ SOLN
INTRAMUSCULAR | Status: AC
  Filled 2017-06-29: qty 5

## 2017-06-29 MED ORDER — ONDANSETRON HCL 4 MG/2ML IJ SOLN
INTRAMUSCULAR | Status: AC
  Filled 2017-06-29: qty 2

## 2017-06-29 MED ORDER — LIDOCAINE HCL (CARDIAC) 20 MG/ML IV SOLN
INTRAVENOUS | Status: DC | PRN
  Administered 2017-06-29: 60 mg via INTRAVENOUS

## 2017-06-29 MED ORDER — LABETALOL HCL 5 MG/ML IV SOLN
INTRAVENOUS | Status: AC
  Administered 2017-06-29: 5 mg via INTRAVENOUS
  Filled 2017-06-29: qty 4

## 2017-06-29 MED ORDER — SODIUM CHLORIDE 0.9 % IJ SOLN
INTRAMUSCULAR | Status: AC
  Filled 2017-06-29: qty 50

## 2017-06-29 MED ORDER — HYDROMORPHONE HCL 1 MG/ML IJ SOLN
0.5000 mg | INTRAMUSCULAR | Status: DC | PRN
  Administered 2017-06-29: 0.5 mg via INTRAVENOUS

## 2017-06-29 MED ORDER — ONDANSETRON HCL 4 MG/2ML IJ SOLN: 4.0000 mg | Freq: Once | INTRAMUSCULAR | Status: DC | PRN

## 2017-06-29 MED ORDER — KETOROLAC TROMETHAMINE 30 MG/ML IJ SOLN
INTRAMUSCULAR | Status: DC | PRN
  Administered 2017-06-29: 30 mg via INTRAVENOUS

## 2017-06-29 MED ORDER — ROCURONIUM BROMIDE 50 MG/5ML IV SOLN
INTRAVENOUS | Status: AC
  Filled 2017-06-29: qty 1

## 2017-06-29 MED ORDER — DEXAMETHASONE SODIUM PHOSPHATE 10 MG/ML IJ SOLN
INTRAMUSCULAR | Status: AC
  Filled 2017-06-29: qty 1

## 2017-06-29 MED ORDER — KETOROLAC TROMETHAMINE 30 MG/ML IJ SOLN
INTRAMUSCULAR | Status: AC
  Filled 2017-06-29: qty 1

## 2017-06-29 MED ORDER — HYDROCODONE-ACETAMINOPHEN 5-325 MG PO TABS
1.0000 | ORAL_TABLET | ORAL | Status: DC | PRN
Start: 2017-06-29 — End: 2017-06-30
  Administered 2017-06-29 – 2017-06-30 (×2): 1 via ORAL
  Administered 2017-06-30: 2 via ORAL
  Filled 2017-06-29: qty 1
  Filled 2017-06-29: qty 2
  Filled 2017-06-29: qty 1

## 2017-06-29 MED ORDER — PANTOPRAZOLE SODIUM 40 MG PO TBEC
40.0000 mg | DELAYED_RELEASE_TABLET | Freq: Every day | ORAL | Status: DC
  Administered 2017-06-30: 40 mg via ORAL
  Filled 2017-06-29: qty 1

## 2017-06-29 MED ORDER — ATORVASTATIN CALCIUM 20 MG PO TABS
20.0000 mg | ORAL_TABLET | Freq: Every day | ORAL | Status: DC
  Administered 2017-06-30: 20 mg via ORAL
  Filled 2017-06-29: qty 1

## 2017-06-29 MED ORDER — ONDANSETRON HCL 4 MG/2ML IJ SOLN
INTRAMUSCULAR | Status: DC | PRN
  Administered 2017-06-29: 4 mg via INTRAVENOUS

## 2017-06-29 MED ORDER — FENTANYL CITRATE (PF) 100 MCG/2ML IJ SOLN
INTRAMUSCULAR | Status: DC | PRN
  Administered 2017-06-29: 50 ug via INTRAVENOUS
  Administered 2017-06-29: 100 ug via INTRAVENOUS
  Administered 2017-06-29 (×2): 50 ug via INTRAVENOUS

## 2017-06-29 MED ORDER — CARVEDILOL 6.25 MG PO TABS
6.2500 mg | ORAL_TABLET | Freq: Two times a day (BID) | ORAL | Status: DC
  Administered 2017-06-30: 6.25 mg via ORAL
  Filled 2017-06-29: qty 1

## 2017-06-29 MED ORDER — FENTANYL CITRATE (PF) 100 MCG/2ML IJ SOLN
25.0000 ug | INTRAMUSCULAR | Status: AC | PRN
  Administered 2017-06-29 (×6): 25 ug via INTRAVENOUS

## 2017-06-29 MED ORDER — LITHIUM CARBONATE ER 450 MG PO TBCR
450.0000 mg | EXTENDED_RELEASE_TABLET | Freq: Two times a day (BID) | ORAL | Status: DC
  Administered 2017-06-29 – 2017-06-30 (×2): 450 mg via ORAL
  Filled 2017-06-29 (×3): qty 1

## 2017-06-29 MED ORDER — ALPRAZOLAM 1 MG PO TABS
1.0000 mg | ORAL_TABLET | Freq: Three times a day (TID) | ORAL | Status: DC | PRN
  Administered 2017-06-30: 1 mg via ORAL
  Filled 2017-06-29: qty 1

## 2017-06-29 MED ORDER — MORPHINE SULFATE (PF) 2 MG/ML IV SOLN
2.0000 mg | INTRAVENOUS | Status: DC | PRN
  Administered 2017-06-29: 2 mg via INTRAVENOUS
  Filled 2017-06-29: qty 1

## 2017-06-29 MED ORDER — ROCURONIUM BROMIDE 100 MG/10ML IV SOLN
INTRAVENOUS | Status: DC | PRN
  Administered 2017-06-29 (×2): 50 mg via INTRAVENOUS

## 2017-06-29 MED ORDER — HYDROMORPHONE HCL 1 MG/ML IJ SOLN
INTRAMUSCULAR | Status: AC
  Filled 2017-06-29: qty 1

## 2017-06-29 MED ORDER — PROPOFOL 10 MG/ML IV BOLUS
INTRAVENOUS | Status: DC | PRN
  Administered 2017-06-29: 120 mg via INTRAVENOUS
  Administered 2017-06-29: 50 mg via INTRAVENOUS

## 2017-06-29 MED ORDER — PROPOFOL 10 MG/ML IV BOLUS
INTRAVENOUS | Status: AC
  Filled 2017-06-29: qty 20

## 2017-06-29 SURGICAL SUPPLY — 67 items
APPLIER CLIP ROT 10 11.4 M/L (STAPLE) ×4
APR CLP MED LRG 11.4X10 (STAPLE) ×2
BAG SPEC RTRVL LRG 6X4 10 (ENDOMECHANICALS) ×2
BANDAGE ELASTIC 4 LF NS (GAUZE/BANDAGES/DRESSINGS) ×1 IMPLANT
BANDAGE ELASTIC 6 LF NS (GAUZE/BANDAGES/DRESSINGS) ×1 IMPLANT
BLADE SURG 11 STRL SS SAFETY (MISCELLANEOUS) ×4 IMPLANT
BNDG CMPR MED 5X4 ELC HKLP NS (GAUZE/BANDAGES/DRESSINGS)
BNDG CMPR MED 5X6 ELC HKLP NS (GAUZE/BANDAGES/DRESSINGS)
BNDG GAUZE 4.5X4.1 6PLY STRL (MISCELLANEOUS) ×1 IMPLANT
CANISTER SUCT 1200ML W/VALVE (MISCELLANEOUS) ×4 IMPLANT
CANNULA DILATOR  5MM W/SLV (CANNULA) ×5
CANNULA DILATOR 10 W/SLV (CANNULA) ×3 IMPLANT
CANNULA DILATOR 10MM W/SLV (CANNULA) ×1
CANNULA DILATOR 5 W/SLV (CANNULA) ×12 IMPLANT
CATH CHOLANG 76X19 KUMAR (CATHETERS) ×4 IMPLANT
CHLORAPREP W/TINT 26ML (MISCELLANEOUS) ×4 IMPLANT
CLIP APPLIE ROT 10 11.4 M/L (STAPLE) ×2 IMPLANT
CLOSURE WOUND 1/2 X4 (GAUZE/BANDAGES/DRESSINGS) ×1
CONRAY 60ML FOR OR (MISCELLANEOUS) ×4 IMPLANT
DISSECTOR KITTNER STICK (MISCELLANEOUS) ×1 IMPLANT
DISSECTORS/KITTNER STICK (MISCELLANEOUS)
DRAPE LAPAROTOMY 100X77 ABD (DRAPES) ×1 IMPLANT
DRAPE SHEET LG 3/4 BI-LAMINATE (DRAPES) ×4 IMPLANT
DRSG TEGADERM 2-3/8X2-3/4 SM (GAUZE/BANDAGES/DRESSINGS) ×4 IMPLANT
DRSG TEGADERM 4X4.75 (GAUZE/BANDAGES/DRESSINGS) ×1 IMPLANT
DRSG TELFA 4X3 1S NADH ST (GAUZE/BANDAGES/DRESSINGS) ×4 IMPLANT
ELECT REM PT RETURN 9FT ADLT (ELECTROSURGICAL) ×4
ELECTRODE REM PT RTRN 9FT ADLT (ELECTROSURGICAL) ×2 IMPLANT
FILTER LAP SMOKE EVAC STRL (MISCELLANEOUS) ×3 IMPLANT
GAUZE SPONGE 4X4 12PLY STRL (GAUZE/BANDAGES/DRESSINGS) ×1 IMPLANT
GLOVE BIO SURGEON STRL SZ7.5 (GLOVE) ×13 IMPLANT
GLOVE INDICATOR 8.0 STRL GRN (GLOVE) ×10 IMPLANT
GOWN STRL REUS W/ TWL LRG LVL3 (GOWN DISPOSABLE) ×6 IMPLANT
GOWN STRL REUS W/ TWL XL LVL3 (GOWN DISPOSABLE) ×1 IMPLANT
GOWN STRL REUS W/TWL LRG LVL3 (GOWN DISPOSABLE) ×12
GOWN STRL REUS W/TWL XL LVL3 (GOWN DISPOSABLE)
IRRIGATION STRYKERFLOW (MISCELLANEOUS) ×2 IMPLANT
IRRIGATOR STRYKERFLOW (MISCELLANEOUS) ×4
IV LACTATED RINGERS 1000ML (IV SOLUTION) ×4 IMPLANT
KIT RM TURNOVER STRD PROC AR (KITS) ×4 IMPLANT
LABEL OR SOLS (LABEL) ×4 IMPLANT
NDL HYPO 21X1.5 SAFETY (NEEDLE) IMPLANT
NDL INSUFF ACCESS 14 VERSASTEP (NEEDLE) ×4 IMPLANT
NEEDLE HYPO 21X1.5 SAFETY (NEEDLE) ×4 IMPLANT
NS IRRIG 500ML POUR BTL (IV SOLUTION) ×4 IMPLANT
PACK BASIN MINOR ARMC (MISCELLANEOUS) ×1 IMPLANT
PACK LAP CHOLECYSTECTOMY (MISCELLANEOUS) ×4 IMPLANT
PAD PREP 24X41 OB/GYN DISP (PERSONAL CARE ITEMS) ×1 IMPLANT
POUCH SPECIMEN RETRIEVAL 10MM (ENDOMECHANICALS) ×4 IMPLANT
SCISSORS METZENBAUM CVD 33 (INSTRUMENTS) ×4 IMPLANT
SEAL FOR SCOPE WARMER C3101 (MISCELLANEOUS) ×1 IMPLANT
SHEARS HARMONIC ACE PLUS 36CM (ENDOMECHANICALS) ×3 IMPLANT
STOCKINETTE STRL 6IN 960660 (GAUZE/BANDAGES/DRESSINGS) ×1 IMPLANT
STRIP CLOSURE SKIN 1/2X4 (GAUZE/BANDAGES/DRESSINGS) ×3 IMPLANT
SUT ETHILON 4-0 (SUTURE)
SUT ETHILON 4-0 FS2 18XMFL BLK (SUTURE)
SUT VIC AB 0 CT2 27 (SUTURE) ×4 IMPLANT
SUT VIC AB 2-0 CT1 (SUTURE) ×1 IMPLANT
SUT VIC AB 3-0 SH 27 (SUTURE)
SUT VIC AB 3-0 SH 27X BRD (SUTURE) ×1 IMPLANT
SUT VIC AB 4-0 FS2 27 (SUTURE) ×4 IMPLANT
SUT VICRYL+ 3-0 144IN (SUTURE) ×1 IMPLANT
SUTURE ETHLN 4-0 FS2 18XMF BLK (SUTURE) ×1 IMPLANT
SWABSTK COMLB BENZOIN TINCTURE (MISCELLANEOUS) ×4 IMPLANT
TROCAR XCEL NON-BLD 11X100MML (ENDOMECHANICALS) ×4 IMPLANT
TUBING INSUFFLATOR HI FLOW (MISCELLANEOUS) ×4 IMPLANT
WATER STERILE IRR 1000ML POUR (IV SOLUTION) ×4 IMPLANT

## 2017-06-29 NOTE — Transfer of Care (Signed)
Immediate Anesthesia Transfer of Care Note  Patient: [name]  Procedure(s) Performed: Procedure(s): LAPAROSCOPIC CHOLECYSTECTOMY WITH INTRAOPERATIVE CHOLANGIOGRAM, POSSIBLE OPEN (N/A)  Patient Location: PACU  Anesthesia Type:General  Level of Consciousness: sedated  Airway & Oxygen Therapy: Patient Spontanous Breathing and Patient connected to face mask oxygen  Post-op Assessment: Report given to RN and Post -op Vital signs reviewed and stable  Post vital signs: Reviewed  Last Vitals:  Vitals:   06/29/17 1534 06/29/17 1535  BP: (!) 113/52 (!) 113/52  Pulse: (!) 57 (!) 56  Resp: (!) 8 14  Temp: 36.4 C     Last Pain:  Vitals:   06/29/17 1145  TempSrc: Oral         Complications: No apparent anesthesia complications

## 2017-06-29 NOTE — H&P (Signed)
No change in history or physical . For cholecystectomy, partial mesh removal of abdominal wall.

## 2017-06-29 NOTE — Anesthesia Post-op Follow-up Note (Cosign Needed)
Anesthesia QCDR form completed.        

## 2017-06-29 NOTE — Anesthesia Postprocedure Evaluation (Signed)
Anesthesia Post Note  Patient: [name]  Procedure(s) Performed: Procedure(s) (LRB): LAPAROSCOPIC CHOLECYSTECTOMY WITH INTRAOPERATIVE CHOLANGIOGRAM, POSSIBLE OPEN (N/A)  Patient location during evaluation: PACU Anesthesia Type: General Level of consciousness: awake and alert Pain management: pain level controlled Vital Signs Assessment: post-procedure vital signs reviewed and stable Respiratory status: spontaneous breathing, nonlabored ventilation, respiratory function stable and patient connected to nasal cannula oxygen Cardiovascular status: blood pressure returned to baseline and stable Postop Assessment: no signs of nausea or vomiting Anesthetic complications: no     Last Vitals:  Vitals:   06/29/17 1755 06/29/17 1758  BP: (!) 141/101 (!) 167/82  Pulse: 67 69  Resp:    Temp: (!) 36.4 C     Last Pain:  Vitals:   06/29/17 1755  TempSrc: Oral  PainSc:                  Anarosa Kubisiak S

## 2017-06-29 NOTE — Anesthesia Procedure Notes (Signed)
Procedure Name: Intubation Date/Time: 06/29/2017 1:28 PM Performed by: Eben Burow Pre-anesthesia Checklist: Patient identified, Emergency Drugs available, Suction available, Patient being monitored and Timeout performed Patient Re-evaluated:Patient Re-evaluated prior to induction Oxygen Delivery Method: Circle system utilized Preoxygenation: Pre-oxygenation with 100% oxygen Induction Type: IV induction Ventilation: Mask ventilation without difficulty Laryngoscope Size: Mac and 4 Grade View: Grade I Tube type: Oral Tube size: 7.5 mm Number of attempts: 1 Airway Equipment and Method: Stylet Placement Confirmation: ETT inserted through vocal cords under direct vision,  positive ETCO2 and breath sounds checked- equal and bilateral Secured at: 24 cm Tube secured with: Tape Dental Injury: Teeth and Oropharynx as per pre-operative assessment

## 2017-06-29 NOTE — Op Note (Signed)
Preoperative diagnosis: Chronic right upper quadrant pain, gallbladder polyp by ultrasound.  Postoperative diagnosis extensive intra-abdominal adhesions, chronic cholecystitis, chronic right upper quadrant pain of unclear etiology.  Operative procedure: Diagnostic laparoscopy, lysis of adhesions, laparoscopic cholecystectomy with intraoperative cholangiograms.  Operating surgeon: Ollen Bowl, M.D.  Anesthesia: Gen. endotracheal, Marcaine 0.5% with 1-200,000 epinephrine, 30 mL.  Estimated blood loss: 10 mL.  Clinical note: This 56 year old male underwent open aortic surgery in 2015. He subsequently developed a significant ventral hernia requiring component separation in late 2016. The patient reported that he awoke in the recovery room after that procedure with pain underneath the right rib cage that has persisted since that time. He subsequently been evaluated by ultrasound and shown to have a 12 mm polyp in the body/neck. The patient's CT scan showed no evidence of gallbladder biliary tract abnormality and no evidence of inflammation to suggest mesh infection or seroma. A repeat ultrasound again suggested a gallbladder polyp and due to his chronic pain in his health [name] for cholecystectomy and potentially identification of an irritating permanent suture from mesh placement. The patient received Kefzol prior to the procedure and had SCD stockings for DVT prevention.  Operative note: With the patient under adequate general endotracheal anesthesia the abdomen was prepped with ChloraPrep and draped. Preoperative review of the CT scan suggested a clear area of abdominal entry in the left upper quadrant under the rib cage. A small incision was made and a very's needle placed. After assuring intra-abdominal location with the hanging drop test the abdomen was insufflated with CO2 a 10 mmHg pressure without incident. A 5 mm Step port was expanded inspection showed ACE of adhesions of the omentum to  the anterior abdominal wall. With careful manipulation the scope could be advanced into the right upper quadrant which was the area least involved with adhesive disease. After a hour long tedious dissection 3 additional ports were placed in the right upper quadrant to allow clearing of the anterior abdominal wall to allow placement of the near midline ports. The patient multiple loops of small bowel adherent to the midline area below at the level of the umbilicus and it was possible to place a varies needle above these for placement of a 10 mm Step port. An 11 mm XL port was placed in the epigastrium and this allowed access to the right upper quadrant. Adhesions of the area of the omentum to the anterior abdominal wall were taken down with the Harmonic scalpel. The proximal transverse colon was well away from the anterior abdominal wall and was carefully observed during dissection.   The gallbladder was placed on cephalad traction and the neck of the gallbladder cleared. Fluoroscopic cholangiograms were completed using 15 mL of one half strength Conray 60. A long corkscrew cystic duct was identified with reflux in the common hepatic duct and free flow into the duodenum. No evidence of retained stones. The cystic duct was doubly clipped and divided as was the cystic artery. The gallbladder was removed from the liver bed making use of hook cautery dissection. This was placed into a Endo Catch bag and delivered through the lower midline port site. The right upper quadrant was irrigated and good hemostasis noted.  The area of maximum pain had been marked on the rib cage margin prior to the surgery. Inspection of this area showed the edge of the mesh that no evidence of a Prolene suture as described in the operative report as a source for pain. Dissection through the peritoneum and slightly  into the posterior rectus sheath to look for the suture making use of Harmonic scalpel was unremarkable. At this time 10 mL of  0.5% Marcaine with 1-200,000 units was injected around the edge of the rib to see if blocking this area would result in relief of the patient's pain. The remaining local anesthetic was placed at the port sites at the end of the procedure.  Inspection of the multiple loops of small bowel adherent to the anterior abdominal wall showed no evidence of disturbance on reinspection from the right upper quadrant. The abdomen was irrigated and then desufflated under direct vision. Skin incisions were closed with 4-0 Vicryl subcuticular sutures. Benzoin, Steri-Strips, Telfa and Tegaderm dressings were applied.  The gallbladder was opened in the operating room. Mild chronic cholecystitis. No polyp. Possibly one small stone.  The patient tolerated the procedure well and was taken to the recovery room in stable condition.

## 2017-06-29 NOTE — Anesthesia Preprocedure Evaluation (Signed)
Anesthesia Evaluation  Patient identified by MRN, date of birth, ID band Patient awake    Reviewed: Allergy & Precautions, NPO status , Patient's Chart, lab work & pertinent test results, reviewed documented beta blocker date and time   History of Anesthesia Complications Negative for: history of anesthetic complications  Airway Mallampati: I  TM Distance: >3 FB Neck ROM: Full    Dental  (+) Edentulous Upper, Edentulous Lower   Pulmonary shortness of breath and with exertion, Current Smoker,    breath sounds clear to auscultation       Cardiovascular hypertension, Pt. on medications and Pt. on home beta blockers (-) angina+ CAD, + Past MI, + Cardiac Stents and + Peripheral Vascular Disease   Rhythm:Regular Rate:Normal  '13 Cath: known occluded LCx, RCA stent w/ mild restenosis, LAD no significant dz, EF 35%;  '14 Cath: no significant change, EF 40%   Neuro/Psych Seizures -,  PSYCHIATRIC DISORDERS Anxiety Depression Bipolar Disorder Schizophrenia  Neuromuscular disease negative neurological ROS     GI/Hepatic GERD  Medicated and Controlled,(+)     substance abuse  alcohol use, Hepatitis -, C  Endo/Other  negative endocrine ROS  Renal/GU negative Renal ROS  negative genitourinary   Musculoskeletal   Abdominal   Peds negative pediatric ROS (+)  Hematology   Anesthesia Other Findings   Reproductive/Obstetrics                             Anesthesia Physical  Anesthesia Plan  ASA: III  Anesthesia Plan: General   Post-op Pain Management:    Induction: Intravenous  PONV Risk Score and Plan: 3 and Ondansetron, Dexamethasone, Propofol and Midazolam  Airway Management Planned: Oral ETT  Additional Equipment: Arterial line  Intra-op Plan:   Post-operative Plan: Extubation in OR  Informed Consent: I have reviewed the patients History and Physical, chart, labs and discussed the  procedure including the risks, benefits and alternatives for the proposed anesthesia with the patient or authorized representative who has indicated his/her understanding and acceptance.     Plan Discussed with: CRNA and Surgeon  Anesthesia Plan Comments: (Plan routine monitors, A-line, GETA )        Anesthesia Quick Evaluation

## 2017-06-29 NOTE — OR Nursing (Signed)
Call returned by Dr. Bary Castilla.  .  Dressing changed with sterile gauze and paper tape per Dr. Bary Castilla request.  Steri strips intact with no active bleeding.  No increase in size of swollen area beneath incision.  Ice pack applied.  Dr. Bary Castilla will see patient on floor.

## 2017-06-29 NOTE — Progress Notes (Signed)
Called due to excessive bleeding from the epigastric port site. Head soaked in areas size of a cantaloupe on the blanket by nursing report. Ice applied. Examination of the wound shows a medium egg size hematoma, likely from the rectus muscle. No active bleeding at this time. We'll manage conservatively with localized.

## 2017-06-29 NOTE — OR Nursing (Signed)
Noted drainage at upper right port site.  Golf ball size hardened swollen area under saturated dressing.  Also cantaloupe size bright red blood through patient gown and inner blanket x 2.  Pressure with sterile gauze.  Dr. Bary Castilla paged. Vital signs stable.

## 2017-06-30 ENCOUNTER — Encounter: Payer: Self-pay | Admitting: General Surgery

## 2017-06-30 DIAGNOSIS — K824 Cholesterolosis of gallbladder: Secondary | ICD-10-CM | POA: Diagnosis not present

## 2017-06-30 LAB — CBC
HCT: 43.4 % (ref 40.0–52.0)
Hemoglobin: 14.9 g/dL (ref 13.0–18.0)
MCH: 33.7 pg (ref 26.0–34.0)
MCHC: 34.4 g/dL (ref 32.0–36.0)
MCV: 97.8 fL (ref 80.0–100.0)
Platelets: 173 10*3/uL (ref 150–440)
RBC: 4.44 MIL/uL (ref 4.40–5.90)
RDW: 13.4 % (ref 11.5–14.5)
WBC: 13.4 10*3/uL — ABNORMAL HIGH (ref 3.8–10.6)

## 2017-06-30 MED ORDER — HYDROCODONE-ACETAMINOPHEN 5-325 MG PO TABS: 1.0000 | ORAL_TABLET | ORAL | 0 refills | Status: DC | PRN

## 2017-06-30 NOTE — Final Progress Note (Signed)
Pt with no complaints.  Anxious to go home. No n/v. Tolerating po well. Has been up and walked. AVSS Abdomen is soft, good bowel sounds. No further bleeding from epigastric port site. Lungs- few scattered wheezes- pt is a smoker. Ox sat 98% on RA Overall doing well. Ok to discharge.

## 2017-06-30 NOTE — Progress Notes (Signed)
Patient discharged to home as ordered, follow up appointments given as ordered. Dressing to abdomen clean, dry and intact. Patient denies pain at this time. IV discontinued to left inner forearm site clean, dry and intact. Patient is being taken home by a friend.

## 2017-06-30 NOTE — Progress Notes (Signed)
Dr Jamal Collin notified RN that patient will be discharged, RN told Dr Jamal Collin that patient had not pass flatus.  MD states "that's okay, he can go home"

## 2017-07-03 LAB — SURGICAL PATHOLOGY

## 2017-07-03 NOTE — Discharge Summary (Signed)
Physician Discharge Summary  Patient ID: [name] MRN: [id number] DOB/AGE: October 15, 1961 56 y.o.  Admit date: 06/29/2017 Discharge date: 07/03/2017  Admission Diagnoses: Chronic cholecystitis  Discharge Diagnoses:  Active Problems:   Cholecystitis   Discharged Condition: good  Hospital Course: The patient was admitted for overnight observation based on the prolonged procedure length and extensive lysis of adhesions required to complete the cholecystectomy. He tolerated diet well and was able to be discharged home the morning after surgery.   Consults: None  Significant Diagnostic Studies: labs: CBC  Treatments: IV hydration  Discharge Exam: Blood pressure (!) 107/58, pulse 65, temperature (!) 97.4 F (36.3 C), temperature source Oral, resp. rate 18, height 5\' 7"  (1.702 m), weight 160 lb (72.6 kg), SpO2 98 %. General appearance: alert Resp: clear to auscultation bilaterally Cardio: regular rate and rhythm, S1, S2 normal, no murmur, click, rub or gallop GI: soft, non-tender; bowel sounds normal; no masses,  no organomegaly  Disposition: 01-Home or Self Care  Discharge Instructions    Call MD for:  persistant nausea and vomiting    Complete by:  As directed    Call MD for:  redness, tenderness, or signs of infection (pain, swelling, redness, odor or green/yellow discharge around incision site)    Complete by:  As directed    Call MD for:  severe uncontrolled pain    Complete by:  As directed    Call MD for:  temperature >100.4    Complete by:  As directed    Diet - low sodium heart healthy    Complete by:  As directed    Discharge instructions    Complete by:  As directed    No exertional activity. May shower. Ice pack to  Upper most dressing off and on for rest of today.     Allergies as of 06/30/2017   No Known Allergies     Medication List    TAKE these medications   ALPRAZolam 1 MG tablet Commonly known as:  XANAX TAKE 1 TABLET BY MOUTH 3 TIMES A  DAILY   atorvastatin 20 MG tablet Commonly known as:  LIPITOR TAKE 1 TABLET (20 MG TOTAL) BY MOUTH DAILY.   buPROPion 150 MG 24 hr tablet Commonly known as:  WELLBUTRIN XL Take 150 mg by mouth every morning.   carvedilol 6.25 MG tablet Commonly known as:  COREG TAKE 1 TABLET (6.25 MG TOTAL) BY MOUTH 2 (TWO) TIMES DAILY WITH A MEAL.   divalproex 500 MG 24 hr tablet Commonly known as:  DEPAKOTE ER Take 1,500 mg by mouth daily.   HYDROcodone-acetaminophen 5-325 MG tablet Commonly known as:  NORCO/VICODIN Take 1-2 tablets by mouth every 4 (four) hours as needed for moderate pain.   lithium carbonate 450 MG CR tablet Commonly known as:  ESKALITH Take 450 mg by mouth 2 (two) times daily.   losartan 50 MG tablet Commonly known as:  COZAAR TAKE 1 TABLET (50 MG TOTAL) BY MOUTH DAILY.   pantoprazole 40 MG tablet Commonly known as:  PROTONIX Take 40 mg by mouth daily before breakfast.   REXULTI 3 MG Tabs Generic drug:  Brexpiprazole TALE 1 TABLET BY MOUTH DAILY      Follow-up Information    Robert Bellow, MD Follow up in 1 week(s).   Specialties:  General Surgery, Radiology Contact information: [address] [place] [address] [phone]           Signed: Robert Bellow 07/03/2017, 5:37 PM

## 2017-07-12 ENCOUNTER — Encounter: Payer: Self-pay | Admitting: General Surgery

## 2017-07-12 ENCOUNTER — Ambulatory Visit (INDEPENDENT_AMBULATORY_CARE_PROVIDER_SITE_OTHER): Payer: Medicaid Other | Admitting: General Surgery

## 2017-07-12 VITALS — BP 138/72 | HR 74 | Resp 12 | Ht 67.0 in | Wt 161.0 lb

## 2017-07-12 DIAGNOSIS — K824 Cholesterolosis of gallbladder: Secondary | ICD-10-CM

## 2017-07-12 NOTE — Patient Instructions (Addendum)
Patient to call in 2 weeks and report.

## 2017-07-12 NOTE — Progress Notes (Signed)
Patient ID: [name], male   DOB: Oct 30, 1961, 56 y.o.   MRN: [id number]  Chief Complaint  Patient presents with  . Routine Post Op    gallbladder    HPI [name] is a 56 y.o. male here today for his post op cholecystectomy done on 06/29/2017. Patient states he has had diarrhea every since surgery. Moving his bowels three times daily. (usually after meals). HPI  Past Medical History:  Diagnosis Date  . Anxiety   . Bipolar affective ([place])    takes Lithium daily  . Broken fingers   . Coronary artery disease    a. s/p PCI to RCA at [facility]; b. s/p PCI to RCA '08; c. Cath 05/2012 showed known occluded LCx, RCA stent w/ mild restenosis, LAD no significant dz, EF 35%; d. Cardiac cath in July of 2014 showed no significant change.   . Depression   . GERD (gastroesophageal reflux disease)    takes Protonix daily  . Hepatitis C    "diagnosed in the past 2 wk" (10/22/2012)  . History of ETOH abuse    quit 2007  . Hyperlipidemia   . Hypertension    "pt. denies - no longer taking blood pressure medications"  . Myocardial infarction ([place]) 1996; 1997; 2000's   "total of 3" (10/22/2012)  . PAD (peripheral artery disease) ([place])    2010 PTA & stent to left CIA & left SFA; 05/2012 PTA & stent to distal LCIA; 10/2012 thrombectomy and stents to [place]  . Schizophrenia ([place])   . Seizures ([place])    "when I get anxious" (Stated last seizure three months ago )  . Tobacco abuse   . Urinary frequency     Past Surgical History:  Procedure Laterality Date  . ABDOMINAL AORTAGRAM N/A 10/22/2012   Procedure: ABDOMINAL Maxcine Ham;  Surgeon: Wellington Hampshire, MD;  Location: [place] CATH LAB;  Service: Cardiovascular;  Laterality: N/A;  . ABDOMINAL AORTAGRAM N/A 11/11/2014   Procedure: ABDOMINAL Maxcine Ham;  Surgeon: Wellington Hampshire, MD;  Location: [place] CATH LAB;  Service: Cardiovascular;  Laterality: N/A;  . ANTERIOR CERVICAL DECOMP/DISCECTOMY FUSION  01/2011   plate & screw placement   .  ANTERIOR CERVICAL DECOMP/DISCECTOMY FUSION N/A 01/26/2017   Procedure: ANTERIOR CERVICAL DECOMPRESSION/DISCECTOMY FUSION CERVICAL FOUR- CERVICAL FIVE;  Surgeon: Ashok Pall, MD;  Location: [place];  Service: Neurosurgery;  Laterality: N/A;  ANTERIOR CERVICAL DECOMPRESSION/DISCECTOMY FUSION CERVICAL FOUR- CERVICAL FIVE  . AORTA - BILATERAL FEMORAL ARTERY BYPASS GRAFT N/A 11/20/2014   Procedure: AORTA BIFEMORAL BYPASS GRAFT;  Surgeon: Mal Misty, MD;  Location: [place];  Service: Vascular;  Laterality: N/A;  . BACK SURGERY    . BRAIN SURGERY  1989   subdural hematoma  . CARDIAC CATHETERIZATION     Stent - 12  . CHOLECYSTECTOMY N/A 06/29/2017   Procedure: LAPAROSCOPIC CHOLECYSTECTOMY WITH INTRAOPERATIVE CHOLANGIOGRAM, POSSIBLE OPEN;  Surgeon: Robert Bellow, MD;  Location: [facility] ORS;  Service: General;  Laterality: N/A;  . CORONARY ANGIOPLASTY WITH STENT PLACEMENT  03/2007   to RCA  . HERNIA REPAIR    . ILIAC ARTERY STENT  10/22/2012   "2" (10/22/2012)  . ILIAC ARTERY STENT  06/2012   left  . INCISIONAL HERNIA REPAIR N/A 11/08/2015   Procedure: INCISIONAL HERNIA REPAIR WITH MYOFASCIAL RELEASE;  Surgeon: Rolm Bookbinder, MD;  Location: [place];  Service: General;  Laterality: N/A;  . INSERTION OF MESH N/A 11/08/2015   Procedure: INSERTION OF MESH;  Surgeon: Rolm Bookbinder, MD;  [facility];  Service: General;  Laterality: N/A;  . LEFT HEART CATHETERIZATION WITH CORONARY ANGIOGRAM N/A 05/29/2012   Procedure: LEFT HEART CATHETERIZATION WITH CORONARY ANGIOGRAM;  Surgeon: Wellington Hampshire, MD;  Location: [place] CATH LAB;  Service: Cardiovascular;  Laterality: N/A;  . LIVER BIOPSY  10/2012  . LOWER EXTREMITY ANGIOGRAM N/A 05/29/2012   Procedure: LOWER EXTREMITY ANGIOGRAM;  Surgeon: Wellington Hampshire, MD;  Location: [place] CATH LAB;  Service: Cardiovascular;  Laterality: N/A;  . PERCUTANEOUS STENT INTERVENTION Left 05/29/2012   Procedure: PERCUTANEOUS STENT INTERVENTION;  Surgeon: Wellington Hampshire, MD;   Location: [place] CATH LAB;  Service: Cardiovascular;  Laterality: Left;  . POSTERIOR FUSION CERVICAL SPINE  2012  . SUBDURAL HEMATOMA EVACUATION VIA CRANIOTOMY      Family History  Problem Relation Age of Onset  . Emphysema Father   . COPD Mother   . Diabetes Other   . Alcohol abuse Other   . Hypertension Other   . Hyperlipidemia Other   . Seizures Other     Social History Social History  Substance Use Topics  . Smoking status: Current Every Day Smoker    Packs/day: 1.00    Years: 40.00    Types: Cigarettes  . Smokeless tobacco: Never Used  . Alcohol use 0.0 oz/week     Comment: occasional    No Known Allergies  Current Outpatient Prescriptions  Medication Sig Dispense Refill  . ALPRAZolam (XANAX) 1 MG tablet TAKE 1 TABLET BY MOUTH 3 TIMES A DAILY  2  . atorvastatin (LIPITOR) 20 MG tablet TAKE 1 TABLET (20 MG TOTAL) BY MOUTH DAILY. 30 tablet 3  . buPROPion (WELLBUTRIN XL) 150 MG 24 hr tablet Take 150 mg by mouth every morning.  2  . carvedilol (COREG) 6.25 MG tablet TAKE 1 TABLET (6.25 MG TOTAL) BY MOUTH 2 (TWO) TIMES DAILY WITH A MEAL. 60 tablet 3  . divalproex (DEPAKOTE ER) 500 MG 24 hr tablet Take 1,500 mg by mouth daily.   2  . lithium carbonate (ESKALITH) 450 MG CR tablet Take 450 mg by mouth 2 (two) times daily.    Marland Kitchen losartan (COZAAR) 50 MG tablet TAKE 1 TABLET (50 MG TOTAL) BY MOUTH DAILY. 30 tablet 3  . pantoprazole (PROTONIX) 40 MG tablet Take 40 mg by mouth daily before breakfast.  3  . REXULTI 3 MG TABS TALE 1 TABLET BY MOUTH DAILY  2   No current facility-administered medications for this visit.     Review of Systems Review of Systems  Constitutional: Negative.   Respiratory: Negative.   Cardiovascular: Negative.   Gastrointestinal: Positive for diarrhea.    Blood pressure 138/72, pulse 74, resp. rate 12, height 5\' 7"  (1.702 m), weight 161 lb (73 kg).  Physical Exam Physical Exam  Constitutional: He is oriented to person, place, and time. He appears  well-developed and well-nourished.  Cardiovascular: Normal rate, regular rhythm and normal heart sounds.   Pulmonary/Chest: Effort normal and breath sounds normal.  Abdominal: Soft. Normal appearance and bowel sounds are normal. There is no tenderness.  Port sites are clean and healing well.   Neurological: He is alert and oriented to person, place, and time.  Skin: Skin is warm and dry.    Data Reviewed 06/29/2017 pathology: DIAGNOSIS:  A. GALLBLADDER; CHOLECYSTECTOMY:  - CHRONIC CHOLECYSTITIS.  - NEGATIVE FOR DYSPLASIA AND MALIGNANCY.   Assessment    Doing well post cholecystectomy.  Marked resolution of pain at the right costochondral rib margin.    Plan  Patient to return as needed and call and report in two weeks Re: Stool frequency. I anticipate this will resolve, if not we'll institute Questran therapy  HPI, Physical Exam, Assessment and Plan have been scribed under the direction and in the presence of Hervey Ard, MD.  Gaspar Cola, CMA  I have completed the exam and reviewed the above documentation for accuracy and completeness.  I agree with the above.  Haematologist has been used and any errors in dictation or transcription are unintentional.  Hervey Ard, M.D., F.A.C.S.  Robert Bellow 07/13/2017, 7:38 PM

## 2017-07-26 ENCOUNTER — Encounter: Payer: Self-pay | Admitting: Cardiovascular Disease

## 2017-07-26 ENCOUNTER — Ambulatory Visit (INDEPENDENT_AMBULATORY_CARE_PROVIDER_SITE_OTHER): Payer: Medicaid Other | Admitting: Cardiovascular Disease

## 2017-07-26 VITALS — BP 154/84 | HR 50 | Ht 67.0 in | Wt 158.8 lb

## 2017-07-26 DIAGNOSIS — I2510 Atherosclerotic heart disease of native coronary artery without angina pectoris: Secondary | ICD-10-CM | POA: Diagnosis not present

## 2017-07-26 DIAGNOSIS — Z720 Tobacco use: Secondary | ICD-10-CM

## 2017-07-26 DIAGNOSIS — I739 Peripheral vascular disease, unspecified: Secondary | ICD-10-CM | POA: Diagnosis not present

## 2017-07-26 DIAGNOSIS — I10 Essential (primary) hypertension: Secondary | ICD-10-CM | POA: Diagnosis not present

## 2017-07-26 DIAGNOSIS — E7800 Pure hypercholesterolemia, unspecified: Secondary | ICD-10-CM | POA: Diagnosis not present

## 2017-07-26 MED ORDER — LOSARTAN POTASSIUM 100 MG PO TABS: 100.0000 mg | ORAL_TABLET | Freq: Every day | ORAL | 3 refills | Status: DC

## 2017-07-26 NOTE — Patient Instructions (Signed)
Medication Instructions:  Your physician has recommended you make the following change in your medication:  1- INCREASE Losartan to 100 mg by mouth once a day.   Labwork: none  Testing/Procedures: none  Follow-Up: Your physician wants you to follow-up in: [place]. You will receive a reminder letter in the mail two months in advance. If you don't receive a letter, please call our office to schedule the follow-up appointment.   If you need a refill on your cardiac medications before your next appointment, please call your pharmacy.

## 2017-07-26 NOTE — Progress Notes (Signed)
Cardiology Office Note   Date:  07/26/2017   ID:  [name], DOB March 23, 1961, MRN [id number]  PCP:  Gayland Curry, MD  Cardiologist:   Kathlyn Sacramento, MD   Chief Complaint  Patient presents with  . other    6 month f/u no complaints today. Meds reviewed verbally with pt.      History of Present Illness: [name] is a 56 y.o. male who presents for a followup visit regarding PAD and coronary artery disease. He has a history of coronary artery disease, stent to his RCA in April 2008 with occluded circumflex, previous bilateral iliac stenting and most recently aortobifemoral bypass in December 2015 by Dr. Kellie Simmering , mild to moderate renal artery stenosis on the left, history of hepatitis C, long alcohol history currently in remission, long history of smoking , hypertension, bipolar disorder and hyperlipidemia .  Most recent cardiac catheterization in July 2014 showed patent RCA stent with chronically occluded left circumflex artery. Ejection fraction was 40%.  He underwent cervical spine surgery in February and most recently he had cholecystectomy. He has been doing reasonably well overall and denies any chest pain, shortness of breath or palpitations. No leg claudication.  Past Medical History:  Diagnosis Date  . Anxiety   . Bipolar affective ([place])    takes Lithium daily  . Broken fingers   . Coronary artery disease    a. s/p PCI to RCA at [facility]; b. s/p PCI to RCA '08; c. Cath 05/2012 showed known occluded LCx, RCA stent w/ mild restenosis, LAD no significant dz, EF 35%; d. Cardiac cath in July of 2014 showed no significant change.   . Depression   . GERD (gastroesophageal reflux disease)    takes Protonix daily  . Hepatitis C    "diagnosed in the past 2 wk" (10/22/2012)  . History of ETOH abuse    quit 2007  . Hyperlipidemia   . Hypertension    "pt. denies - no longer taking blood pressure medications"  . Myocardial infarction ([place]) 1996; 1997; 2000's   "total of 3" (10/22/2012)  . PAD (peripheral artery disease) ([place])    2010 PTA & stent to left CIA & left SFA; 05/2012 PTA & stent to distal LCIA; 10/2012 thrombectomy and stents to [place]  . Schizophrenia ([place])   . Seizures ([place])    "when I get anxious" (Stated last seizure three months ago )  . Tobacco abuse   . Urinary frequency     Past Surgical History:  Procedure Laterality Date  . ABDOMINAL AORTAGRAM N/A 10/22/2012   Procedure: ABDOMINAL Maxcine Ham;  Surgeon: Wellington Hampshire, MD;  Location: [place] CATH LAB;  Service: Cardiovascular;  Laterality: N/A;  . ABDOMINAL AORTAGRAM N/A 11/11/2014   Procedure: ABDOMINAL Maxcine Ham;  Surgeon: Wellington Hampshire, MD;  Location: [place] CATH LAB;  Service: Cardiovascular;  Laterality: N/A;  . ANTERIOR CERVICAL DECOMP/DISCECTOMY FUSION  01/2011   plate & screw placement   . ANTERIOR CERVICAL DECOMP/DISCECTOMY FUSION N/A 01/26/2017   Procedure: ANTERIOR CERVICAL DECOMPRESSION/DISCECTOMY FUSION CERVICAL FOUR- CERVICAL FIVE;  Surgeon: Ashok Pall, MD;  Location: [place];  Service: Neurosurgery;  Laterality: N/A;  ANTERIOR CERVICAL DECOMPRESSION/DISCECTOMY FUSION CERVICAL FOUR- CERVICAL FIVE  . AORTA - BILATERAL FEMORAL ARTERY BYPASS GRAFT N/A 11/20/2014   Procedure: AORTA BIFEMORAL BYPASS GRAFT;  Surgeon: Mal Misty, MD;  Location: [place];  Service: Vascular;  Laterality: N/A;  . BACK SURGERY    . [place]  subdural hematoma  . CARDIAC CATHETERIZATION     Stent - 12  . CHOLECYSTECTOMY N/A 06/29/2017   Procedure: LAPAROSCOPIC CHOLECYSTECTOMY WITH INTRAOPERATIVE CHOLANGIOGRAM, POSSIBLE OPEN;  Surgeon: Robert Bellow, MD;  Location: [facility] ORS;  Service: General;  Laterality: N/A;  . CORONARY ANGIOPLASTY WITH STENT PLACEMENT  03/2007   to RCA  . HERNIA REPAIR    . ILIAC ARTERY STENT  10/22/2012   "2" (10/22/2012)  . ILIAC ARTERY STENT  06/2012   left  . INCISIONAL HERNIA REPAIR N/A 11/08/2015   Procedure: INCISIONAL HERNIA REPAIR WITH  MYOFASCIAL RELEASE;  Surgeon: Rolm Bookbinder, MD;  Location: [place];  Service: General;  Laterality: N/A;  . INSERTION OF MESH N/A 11/08/2015   Procedure: INSERTION OF MESH;  Surgeon: Rolm Bookbinder, MD;  Location: [place];  Service: General;  Laterality: N/A;  . LEFT HEART CATHETERIZATION WITH CORONARY ANGIOGRAM N/A 05/29/2012   Procedure: LEFT HEART CATHETERIZATION WITH CORONARY ANGIOGRAM;  Surgeon: Wellington Hampshire, MD;  Location: [place] CATH LAB;  Service: Cardiovascular;  Laterality: N/A;  . LIVER BIOPSY  10/2012  . LOWER EXTREMITY ANGIOGRAM N/A 05/29/2012   Procedure: LOWER EXTREMITY ANGIOGRAM;  Surgeon: Wellington Hampshire, MD;  Location: [place] CATH LAB;  Service: Cardiovascular;  Laterality: N/A;  . PERCUTANEOUS STENT INTERVENTION Left 05/29/2012   Procedure: PERCUTANEOUS STENT INTERVENTION;  Surgeon: Wellington Hampshire, MD;  Location: [place] CATH LAB;  Service: Cardiovascular;  Laterality: Left;  . POSTERIOR FUSION CERVICAL SPINE  2012  . SUBDURAL HEMATOMA EVACUATION VIA CRANIOTOMY       Current Outpatient Prescriptions  Medication Sig Dispense Refill  . atorvastatin (LIPITOR) 20 MG tablet TAKE 1 TABLET (20 MG TOTAL) BY MOUTH DAILY. 30 tablet 3  . buPROPion (WELLBUTRIN XL) 150 MG 24 hr tablet Take 150 mg by mouth every morning.  2  . carvedilol (COREG) 6.25 MG tablet TAKE 1 TABLET (6.25 MG TOTAL) BY MOUTH 2 (TWO) TIMES DAILY WITH A MEAL. 60 tablet 3  . divalproex (DEPAKOTE ER) 500 MG 24 hr tablet Take 1,500 mg by mouth daily.   2  . lithium carbonate (ESKALITH) 450 MG CR tablet Take 450 mg by mouth 2 (two) times daily.    . pantoprazole (PROTONIX) 40 MG tablet Take 40 mg by mouth daily before breakfast.  3  . REXULTI 3 MG TABS TALE 1 TABLET BY MOUTH DAILY  2  . losartan (COZAAR) 100 MG tablet Take 1 tablet (100 mg total) by mouth daily. 90 tablet 3   No current facility-administered medications for this visit.     Allergies:   Patient has no known allergies.    Social History:  The patient   reports that he has been smoking Cigarettes.  He has a 40.00 pack-year smoking history. He has never used smokeless tobacco. He reports that he drinks alcohol. He reports that he does not use drugs.   Family History:  The patient's family history includes Alcohol abuse in his other; COPD in his mother; Diabetes in his other; Emphysema in his father; Hyperlipidemia in his other; Hypertension in his other; Seizures in his other.    ROS:  Please see the history of present illness.   Otherwise, review of systems are positive for none.   All other systems are reviewed and negative.    PHYSICAL EXAM: VS:  BP (!) 154/84 (BP Location: Left Arm, Patient Position: Sitting, Cuff Size: Normal)   Pulse (!) 50   Ht 5\' 7"  (1.702 m)   Wt 158 lb  12 oz (72 kg)   BMI 24.86 kg/m  , BMI Body mass index is 24.86 kg/m. GEN: Well nourished, well developed, in no acute distress  HEENT: normal  Neck: no JVD, carotid bruits, or masses Cardiac: RRR; no murmurs, rubs, or gallops,no edema  Respiratory:  clear to auscultation bilaterally, normal work of breathing GI: soft, nontender, nondistended, + BS MS: no deformity or atrophy  Skin: warm and dry, no rash Neuro:  Strength and sensation are intact Psych: euthymic mood, full affect Vascular: Femoral pulses are normal. Distal pulses are palpable.  EKG:  EKG is ordered today. The ekg ordered today demonstrates  sinus bradycardia with left anterior fascicular block. Nonspecific lateral T wave changes.   Recent Labs: 12/27/2016: ALT 38 01/26/2017: BUN 7; Potassium 4.4; Sodium 137 06/29/2017: Creatinine, Ser 1.51 06/30/2017: Hemoglobin 14.9; Platelets 173    Lipid Panel    Component Value Date/Time   CHOL 154 09/15/2013 0941   TRIG 143 11/25/2014 0500   HDL 54 09/15/2013 0941   CHOLHDL 2.9 09/15/2013 0941   LDLCALC 82 09/15/2013 0941      Wt Readings from Last 3 Encounters:  07/26/17 158 lb 12 oz (72 kg)  07/12/17 161 lb (73 kg)  06/29/17 160 lb (72.6  kg)      No flowsheet data found.    ASSESSMENT AND PLAN:  1.   Coronary artery disease involving native coronary arteries without angina: Continue medical therapy  2. Peripheral arterial disease: Status post aortobifemoral bypass. Followed by [facility].  Normal Doppler in October 2017.  3. Essential hypertension: Blood pressure is elevated. He has been taking his medications regularly. I increased losartan 100 mg once daily.  4. Hyperlipidemia:  Continue atorvastatin with a target LDL of less than 70.  5. Tobacco use: Unfortunately, he reports inability to quit.   Disposition:   FU with me in 6 months  Signed,  Kathlyn Sacramento, MD  07/26/2017 10:54 AM    [place]

## 2017-08-06 ENCOUNTER — Other Ambulatory Visit: Payer: Self-pay | Admitting: Cardiovascular Disease

## 2017-10-02 ENCOUNTER — Other Ambulatory Visit: Payer: Self-pay | Admitting: Cardiovascular Disease

## 2017-11-19 ENCOUNTER — Telehealth: Payer: Self-pay | Admitting: *Deleted

## 2017-11-19 NOTE — Pre-Procedure Instructions (Addendum)
[name]  11/19/2017      [id number] - [name], [place] - [address] [place] [address] Phone: [phone] Fax: [phone]    Your procedure is scheduled on Wednesday December 19.  Report to [facility] Admitting at 6:30 A.M.  Call this number if you have problems the morning of surgery:  [phone]   Remember:  Do not eat food or drink liquids after midnight.  Take these medicines the morning of surgery with A SIP OF WATER:   Bupropion (wellbutrin) Carvedilol (coreg) Divalproex (Depakote) Pantoprazole (protonix) Lithium (Eskalith)  TOP taking any Aspirin(unless otherwise instructed by your surgeon), Aleve, Naproxen, Ibuprofen, Motrin, Advil, Goody's, BC's, all herbal medications, fish oil, and all vitamins     Do not wear jewelry  Do not wear lotions, powders, or colognes, or deodorant.  Do not shave 48 hours prior to surgery.  Men may shave face and neck.  Do not bring valuables to the hospital.  [facility] is not responsible for any belongings or valuables.  Contacts, dentures or bridgework may not be worn into surgery.  Leave your suitcase in the car.  After surgery it may be brought to your room.  For patients admitted to the hospital, discharge time will be determined by your treatment team.  Patients discharged the day of surgery will not be allowed to drive home.   Special instructions:    [facility]- Preparing For Surgery  Before surgery, you can play an important role. Because skin is not sterile, your skin needs to be as free of germs as possible. You can reduce the number of germs on your skin by washing with CHG (chlorahexidine gluconate) Soap before surgery.  CHG is an antiseptic cleaner which kills germs and bonds with the skin to continue killing germs even after washing.  Please do not use if you have an allergy to CHG or antibacterial soaps. If your skin becomes reddened/irritated stop using the CHG.   Do not shave (including legs and underarms) for at least 48 hours prior to first CHG shower. It is OK to shave your face.  Please follow these instructions carefully.   1. Shower the NIGHT BEFORE SURGERY and the MORNING OF SURGERY with CHG.   2. If you chose to wash your hair, wash your hair first as usual with your normal shampoo.  3. After you shampoo, rinse your hair and body thoroughly to remove the shampoo.  4. Use CHG as you would any other liquid soap. You can apply CHG directly to the skin and wash gently with a scrungie or a clean washcloth.   5. Apply the CHG Soap to your body ONLY FROM THE NECK DOWN.  Do not use on open wounds or open sores. Avoid contact with your eyes, ears, mouth and genitals (private parts). Wash Face and genitals (private parts)  with your normal soap.  6. Wash thoroughly, paying special attention to the area where your surgery will be performed.  7. Thoroughly rinse your body with warm water from the neck down.  8. DO NOT shower/wash with your normal soap after using and rinsing off the CHG Soap.  9. Pat yourself dry with a CLEAN TOWEL.  10. Wear CLEAN PAJAMAS to bed the night before surgery, wear comfortable clothes the morning of surgery  11. Place CLEAN SHEETS on your bed the night of your first shower and DO NOT SLEEP WITH PETS.    Day of  Surgery: Do not apply any deodorants/lotions. Please wear clean clothes to the hospital/surgery center.      Please read over the following fact sheets that you were given. Coughing and Deep Breathing and Surgical Site Infection Prevention

## 2017-11-19 NOTE — Telephone Encounter (Signed)
   [facility] Pre-operative Risk Assessment    Request for surgical clearance:  1. What type of surgery is being performed? MICROLARYNGOSCOPY WITH BIOPSY VOCAL CORD LESION   2. When is this surgery scheduled? 11-21-17   3. Are there any medications that need to be held prior to surgery and how long?NONE   4. Practice name and name of physician performing surgery? DAVID SHOEMAKER MD   5. What is your office phone and fax number? PH=[phone]  FAX=[phone]   6. Anesthesia type (None, local, MAC, general) ? GENERAL   [name] 11/19/2017, 3:44 PM  _________________________________________________________________   (provider comments below)

## 2017-11-20 ENCOUNTER — Other Ambulatory Visit: Payer: Self-pay | Admitting: Otolaryngology

## 2017-11-20 ENCOUNTER — Encounter (HOSPITAL_COMMUNITY)
Admission: RE | Admit: 2017-11-20 | Discharge: 2017-11-20 | Disposition: A | Discharge status: Home / Self Care | Payer: Medicaid Other | Source: Ambulatory Visit | Attending: Otolaryngology | Admitting: Otolaryngology

## 2017-11-20 ENCOUNTER — Encounter (HOSPITAL_COMMUNITY): Payer: Self-pay

## 2017-11-20 ENCOUNTER — Other Ambulatory Visit: Payer: Self-pay

## 2017-11-20 DIAGNOSIS — I10 Essential (primary) hypertension: Secondary | ICD-10-CM | POA: Diagnosis not present

## 2017-11-20 DIAGNOSIS — R490 Dysphonia: Secondary | ICD-10-CM | POA: Diagnosis not present

## 2017-11-20 DIAGNOSIS — K219 Gastro-esophageal reflux disease without esophagitis: Secondary | ICD-10-CM | POA: Diagnosis not present

## 2017-11-20 DIAGNOSIS — E785 Hyperlipidemia, unspecified: Secondary | ICD-10-CM | POA: Insufficient documentation

## 2017-11-20 DIAGNOSIS — I739 Peripheral vascular disease, unspecified: Secondary | ICD-10-CM | POA: Diagnosis not present

## 2017-11-20 DIAGNOSIS — F17200 Nicotine dependence, unspecified, uncomplicated: Secondary | ICD-10-CM | POA: Insufficient documentation

## 2017-11-20 DIAGNOSIS — I255 Ischemic cardiomyopathy: Secondary | ICD-10-CM | POA: Insufficient documentation

## 2017-11-20 DIAGNOSIS — Z01818 Encounter for other preprocedural examination: Secondary | ICD-10-CM | POA: Insufficient documentation

## 2017-11-20 DIAGNOSIS — Z79899 Other long term (current) drug therapy: Secondary | ICD-10-CM | POA: Diagnosis not present

## 2017-11-20 DIAGNOSIS — I2510 Atherosclerotic heart disease of native coronary artery without angina pectoris: Secondary | ICD-10-CM | POA: Diagnosis not present

## 2017-11-20 LAB — PROTIME-INR
INR: 0.97
Prothrombin Time: 12.8 seconds (ref 11.4–15.2)

## 2017-11-20 LAB — COMPREHENSIVE METABOLIC PANEL
ALT: 22 U/L (ref 17–63)
AST: 22 U/L (ref 15–41)
Albumin: 4 g/dL (ref 3.5–5.0)
Alkaline Phosphatase: 47 U/L (ref 38–126)
Anion gap: 5 (ref 5–15)
BUN: 7 mg/dL (ref 6–20)
CO2: 27 mmol/L (ref 22–32)
Calcium: 9.8 mg/dL (ref 8.9–10.3)
Chloride: 105 mmol/L (ref 101–111)
Creatinine, Ser: 1.06 mg/dL (ref 0.61–1.24)
GFR calc Af Amer: >60 mL/min (ref >60)
GFR calc non Af Amer: >60 mL/min (ref >60)
Glucose, Bld: 104 mg/dL — ABNORMAL HIGH (ref 65–99)
Potassium: 4.6 mmol/L (ref 3.5–5.1)
Sodium: 137 mmol/L (ref 135–145)
Total Bilirubin: 1.1 mg/dL (ref 0.3–1.2)
Total Protein: 6.9 g/dL (ref 6.5–8.1)

## 2017-11-20 LAB — CBC
HCT: 44.8 % (ref 39.0–52.0)
Hemoglobin: 15 g/dL (ref 13.0–17.0)
MCH: 32.8 pg (ref 26.0–34.0)
MCHC: 33.5 g/dL (ref 30.0–36.0)
MCV: 97.8 fL (ref 78.0–100.0)
Platelets: 145 10*3/uL — ABNORMAL LOW (ref 150–400)
RBC: 4.58 MIL/uL (ref 4.22–5.81)
RDW: 12.7 % (ref 11.5–15.5)
WBC: 7 10*3/uL (ref 4.0–10.5)

## 2017-11-20 NOTE — Progress Notes (Signed)
PCP - Gayland Curry Cardiologist - Dr. Fletcher Anon  Chest x-ray - 12/31/16 EKG - 07/26/17 Stress Test - 01/22/16 in careeverywhere Cardiac Cath - 06/12/13  Pt denies any recent cardiac problems, no recent chest pain, or SOB. Pt states he has a follow up appointment with Dr. Fletcher Anon in January.   Patient denies shortness of breath, fever, cough and chest pain at PAT appointment  Patient verbalized understanding of instructions that were given to them at the PAT appointment. Patient was also instructed that they will need to review over the PAT instructions again at home before surgery.

## 2017-11-20 NOTE — Progress Notes (Signed)
Anesthesia Chart Review: Patient is a 56 year old male scheduled for microlaryngoscopy with CO2 laser and excision of vocal cord lesion on 11/21/17 by Dr. Jerrell Belfast. Patient has had chronic hoarseness since 01/2017 ACDF. Flexible laryngoscopy showed ulcerated right vocal cord lesion. Tissue biopsy recommended.   History includes smoking, CAD (PCI to RCA 1997 and 2008; known occluded CX), MI (x3), PAD (s/p 2010 PTA &stent to left CIA &left SFA; 05/2012 PTA &stent to distal LCIA; 10/2012 thrombectomy and stents to LCIA and LEIA), s/p AFBG 11/20/14, HTN, hyperlipidemia, hepatitis C, seizures, history alcohol abuse, schizophrenia, anxiety, depression, Bipolar affective disorder, GERD, incisional hernia repair 11/08/15, ACDF 2012 and C4-5 ACDF 01/26/17, cholecystectomy 06/29/17. BMI 25.   Hehad significant problems with confusion and delirium, possible DT's following AFBG 11/2014. He required re-intubation and psychiatry consult. Symptoms thought likely relatedalcohol abuse and psychiatric medications.   - PCP is Dr. Gayland Curry with [facility] (Care Everywhere). - Cardiologist is Dr. Kathlyn Sacramento, last visit 07/26/17. Losartan was increased for HTN, but contined medical therapy recommended for CAD. Six month follow-up planned. Patient denied any recent chest pain, SOB, cough, or fever at PAT this afternoon. Of note, Dr. Victorio Palm office requested cardiac clearance for this procedure. According to telephone encounter by Murray Hodgkins, NP this afternoon,  "Chart reviewed as part of pre-operative protocol coverage. Given past medical history and time since last visit, based on ACC/AHA guidelines, DONTARIUS SHELEY would be at acceptable risk for the planned procedure without further cardiovascular testing. He should continue ? blocker and statin therapy throughout the perioperative period."  Medications include Lipitor, Wellbutrin XL, Coreg, Depakote ER, lithium carbonate,  losartan, Protonix.    BP (!) 150/84   Pulse (!) 59   Temp 36.7 C   Resp 18   Ht 5\' 7"  (1.702 m)   Wt 161 lb 4.8 oz (73.2 kg)   SpO2 97%   BMI 25.26 kg/m   EKG 07/26/17 ([facility]): SB at 50 bpm, LAFB, possible inferior infarct (age undetermined). T wave abnormality, consider lateral ischemia. Overall, EKG appears stable when compared to 12/26/16 tracing.   Nuclear stress test 01/22/16 ([place]; Care Everywhere): Summary:  1. Abnormal LV perfusion. 2. Global left ventricular systolic function was normal, with an EF of 51%. 3. There is a small apical fixed defect. 4. There is a moderate sized fixed inferior, infererolateral , and lateral wall defect. 5. No reversible ischemia. 6. The inferior, inferolaterl and lateral wall appear hypokinetic.   Cardiac cath 06/12/13 [facility]):  - significant 2 vessel CAD with patent RCA stent and chronically occluded L CX. (100% mid CX, 30% mid RCA.) No significant change in anatomy. - Global LV function moderately depressed. EF 40%.   Carotid U/S 07/01/14: Impression: Heterogenous plaque, bilaterally. 1-39% bilateral ICA stenosis. Patent vertebral arteries with antegrade flow. Normal subclavian arteries, bilaterally.  CXR 12/31/16: IMPRESSION: 1. No active cardiopulmonary disease. 2. Coronary stents in place. 3. Aortic atherosclerosis.  Preoperative labs noted. Cr 1.06. Glucose 104. H/H 15.0/44.8. PLT 145K.   If no acute changes then I anticipate that he can proceed as planned.  [facility] Short Stay Center/Anesthesiology Phone [phone] 11/20/2017 5:18 PM

## 2017-11-20 NOTE — Telephone Encounter (Signed)
[name] with anesthesia with [facility]  is calling regarding pt clearance. States pt surgery is in the morning. Please call.

## 2017-11-20 NOTE — Telephone Encounter (Signed)
   Primary Cardiologist: Kathlyn Sacramento, MD  Chart reviewed as part of pre-operative protocol coverage. Given past medical history and time since last visit, based on ACC/AHA guidelines, TYRESS LODEN would be at acceptable risk for the planned procedure without further cardiovascular testing. He should continue  blocker and statin therapy throughout the perioperative period.  Please call with questions.  Murray Hodgkins, NP 11/20/2017, 5:12 PM

## 2017-11-21 ENCOUNTER — Encounter (HOSPITAL_COMMUNITY): Payer: Self-pay | Admitting: *Deleted

## 2017-11-21 ENCOUNTER — Ambulatory Visit (HOSPITAL_COMMUNITY): Payer: Medicaid Other | Admitting: Certified Registered Nurse Anesthetist

## 2017-11-21 ENCOUNTER — Ambulatory Visit (HOSPITAL_COMMUNITY)
Admission: RE | Admit: 2017-11-21 | Discharge: 2017-11-21 | Disposition: A | Discharge status: Home / Self Care | Payer: Medicaid Other | Source: Ambulatory Visit | Attending: Otolaryngology | Admitting: Otolaryngology

## 2017-11-21 ENCOUNTER — Ambulatory Visit (HOSPITAL_COMMUNITY): Payer: Medicaid Other | Admitting: Emergency Medicine

## 2017-11-21 ENCOUNTER — Encounter (HOSPITAL_COMMUNITY)
Admission: RE | Disposition: A | Discharge status: Home / Self Care | Payer: Self-pay | Source: Ambulatory Visit | Attending: Otolaryngology

## 2017-11-21 DIAGNOSIS — Z833 Family history of diabetes mellitus: Secondary | ICD-10-CM | POA: Insufficient documentation

## 2017-11-21 DIAGNOSIS — K219 Gastro-esophageal reflux disease without esophagitis: Secondary | ICD-10-CM | POA: Insufficient documentation

## 2017-11-21 DIAGNOSIS — F319 Bipolar disorder, unspecified: Secondary | ICD-10-CM | POA: Diagnosis not present

## 2017-11-21 DIAGNOSIS — F419 Anxiety disorder, unspecified: Secondary | ICD-10-CM | POA: Insufficient documentation

## 2017-11-21 DIAGNOSIS — Z955 Presence of coronary angioplasty implant and graft: Secondary | ICD-10-CM | POA: Diagnosis not present

## 2017-11-21 DIAGNOSIS — R350 Frequency of micturition: Secondary | ICD-10-CM | POA: Insufficient documentation

## 2017-11-21 DIAGNOSIS — I252 Old myocardial infarction: Secondary | ICD-10-CM | POA: Diagnosis not present

## 2017-11-21 DIAGNOSIS — B1920 Unspecified viral hepatitis C without hepatic coma: Secondary | ICD-10-CM | POA: Diagnosis not present

## 2017-11-21 DIAGNOSIS — Z9049 Acquired absence of other specified parts of digestive tract: Secondary | ICD-10-CM | POA: Diagnosis not present

## 2017-11-21 DIAGNOSIS — E785 Hyperlipidemia, unspecified: Secondary | ICD-10-CM | POA: Diagnosis not present

## 2017-11-21 DIAGNOSIS — F209 Schizophrenia, unspecified: Secondary | ICD-10-CM | POA: Insufficient documentation

## 2017-11-21 DIAGNOSIS — F17210 Nicotine dependence, cigarettes, uncomplicated: Secondary | ICD-10-CM | POA: Insufficient documentation

## 2017-11-21 DIAGNOSIS — C320 Malignant neoplasm of glottis: Secondary | ICD-10-CM | POA: Diagnosis not present

## 2017-11-21 DIAGNOSIS — Z825 Family history of asthma and other chronic lower respiratory diseases: Secondary | ICD-10-CM | POA: Diagnosis not present

## 2017-11-21 DIAGNOSIS — I10 Essential (primary) hypertension: Secondary | ICD-10-CM | POA: Insufficient documentation

## 2017-11-21 DIAGNOSIS — R490 Dysphonia: Secondary | ICD-10-CM | POA: Insufficient documentation

## 2017-11-21 DIAGNOSIS — I2510 Atherosclerotic heart disease of native coronary artery without angina pectoris: Secondary | ICD-10-CM | POA: Diagnosis not present

## 2017-11-21 DIAGNOSIS — R569 Unspecified convulsions: Secondary | ICD-10-CM | POA: Insufficient documentation

## 2017-11-21 DIAGNOSIS — I739 Peripheral vascular disease, unspecified: Secondary | ICD-10-CM | POA: Insufficient documentation

## 2017-11-21 DIAGNOSIS — Z811 Family history of alcohol abuse and dependence: Secondary | ICD-10-CM | POA: Diagnosis not present

## 2017-11-21 DIAGNOSIS — J383 Other diseases of vocal cords: Secondary | ICD-10-CM

## 2017-11-21 DIAGNOSIS — Z820 Family history of epilepsy and other diseases of the nervous system: Secondary | ICD-10-CM | POA: Insufficient documentation

## 2017-11-21 DIAGNOSIS — Z79899 Other long term (current) drug therapy: Secondary | ICD-10-CM | POA: Diagnosis not present

## 2017-11-21 DIAGNOSIS — Z981 Arthrodesis status: Secondary | ICD-10-CM | POA: Diagnosis not present

## 2017-11-21 DIAGNOSIS — M1990 Unspecified osteoarthritis, unspecified site: Secondary | ICD-10-CM | POA: Insufficient documentation

## 2017-11-21 DIAGNOSIS — Z8249 Family history of ischemic heart disease and other diseases of the circulatory system: Secondary | ICD-10-CM | POA: Insufficient documentation

## 2017-11-21 HISTORY — PX: MICROLARYNGOSCOPY WITH CO2 LASER AND EXCISION OF VOCAL CORD LESION: SHX5970

## 2017-11-21 SURGERY — MICROLARYNGOSCOPY WITH CO2 LASER AND EXCISION OF VOCAL CORD LESION
Anesthesia: General | Site: Throat

## 2017-11-21 MED ORDER — EPINEPHRINE PF 1 MG/ML IJ SOLN
INTRAMUSCULAR | Status: DC | PRN
Start: 2017-11-21 — End: 2017-11-21
  Administered 2017-11-21: 1 mg via ENDOTRACHEOPULMONARY

## 2017-11-21 MED ORDER — ONDANSETRON HCL 4 MG/2ML IJ SOLN
INTRAMUSCULAR | Status: DC | PRN
Start: 2017-11-21 — End: 2017-11-21
  Administered 2017-11-21: 4 mg via INTRAVENOUS

## 2017-11-21 MED ORDER — FENTANYL CITRATE (PF) 250 MCG/5ML IJ SOLN
INTRAMUSCULAR | Status: AC
  Filled 2017-11-21: qty 5

## 2017-11-21 MED ORDER — MIDAZOLAM HCL 5 MG/5ML IJ SOLN
INTRAMUSCULAR | Status: DC | PRN
  Administered 2017-11-21: 2 mg via INTRAVENOUS

## 2017-11-21 MED ORDER — PROPOFOL 10 MG/ML IV BOLUS
INTRAVENOUS | Status: AC
  Filled 2017-11-21: qty 20

## 2017-11-21 MED ORDER — OXYCODONE HCL 5 MG/5ML PO SOLN: 5.0000 mg | Freq: Once | ORAL | Status: DC | PRN

## 2017-11-21 MED ORDER — EPINEPHRINE HCL (NASAL) 0.1 % NA SOLN
NASAL | Status: AC
  Filled 2017-11-21: qty 30

## 2017-11-21 MED ORDER — PROPOFOL 10 MG/ML IV BOLUS
INTRAVENOUS | Status: DC | PRN
  Administered 2017-11-21: 200 mg via INTRAVENOUS

## 2017-11-21 MED ORDER — ROCURONIUM BROMIDE 100 MG/10ML IV SOLN
INTRAVENOUS | Status: DC | PRN
  Administered 2017-11-21: 25 mg via INTRAVENOUS

## 2017-11-21 MED ORDER — FENTANYL CITRATE (PF) 100 MCG/2ML IJ SOLN
INTRAMUSCULAR | Status: DC | PRN
  Administered 2017-11-21: 100 ug via INTRAVENOUS

## 2017-11-21 MED ORDER — FENTANYL CITRATE (PF) 100 MCG/2ML IJ SOLN: 25.0000 ug | INTRAMUSCULAR | Status: DC | PRN

## 2017-11-21 MED ORDER — LIDOCAINE HCL (CARDIAC) 20 MG/ML IV SOLN
INTRAVENOUS | Status: DC | PRN
  Administered 2017-11-21: 60 mg via INTRAVENOUS

## 2017-11-21 MED ORDER — SUGAMMADEX SODIUM 200 MG/2ML IV SOLN
INTRAVENOUS | Status: DC | PRN
  Administered 2017-11-21: 200 mg via INTRAVENOUS

## 2017-11-21 MED ORDER — MIDAZOLAM HCL 2 MG/2ML IJ SOLN
INTRAMUSCULAR | Status: AC
  Filled 2017-11-21: qty 2

## 2017-11-21 MED ORDER — 0.9 % SODIUM CHLORIDE (POUR BTL) OPTIME
TOPICAL | Status: DC | PRN
  Administered 2017-11-21: 1000 mL

## 2017-11-21 MED ORDER — OXYCODONE HCL 5 MG PO TABS
5.0000 mg | ORAL_TABLET | Freq: Once | ORAL | Status: DC | PRN
Start: 2017-11-21 — End: 2017-11-21

## 2017-11-21 MED ORDER — ONDANSETRON HCL 4 MG/2ML IJ SOLN: 4.0000 mg | Freq: Four times a day (QID) | INTRAMUSCULAR | Status: DC | PRN

## 2017-11-21 MED ORDER — LACTATED RINGERS IV SOLN
INTRAVENOUS | Status: DC | PRN
  Administered 2017-11-21: 08:00:00 via INTRAVENOUS

## 2017-11-21 SURGICAL SUPPLY — 27 items
BALLN PULM 12 13.5 15X75 (BALLOONS)
BALLN PULM 12 13.5 15X75CM (BALLOONS)
BALLN PULMONARY 10 12MM (MISCELLANEOUS)
BALLN PULMONARY 10-12 (MISCELLANEOUS) IMPLANT
BALLOON PULM 12 13.5 15X75 (BALLOONS) IMPLANT
BANDAGE EYE OVAL (MISCELLANEOUS) ×6 IMPLANT
CANISTER SUCT 3000ML PPV (MISCELLANEOUS) ×3 IMPLANT
CONT SPEC 4OZ CLIKSEAL STRL BL (MISCELLANEOUS) IMPLANT
COVER BACK TABLE 60X90IN (DRAPES) ×3 IMPLANT
CRADLE DONUT ADULT HEAD (MISCELLANEOUS) IMPLANT
DRAPE HALF SHEET 40X57 (DRAPES) ×3 IMPLANT
GAUZE SPONGE 4X4 12PLY STRL (GAUZE/BANDAGES/DRESSINGS) IMPLANT
GLOVE BIO SURGEON STRL SZ7 (GLOVE) ×3 IMPLANT
KIT BASIN OR (CUSTOM PROCEDURE TRAY) ×3 IMPLANT
KIT ROOM TURNOVER OR (KITS) ×3 IMPLANT
NDL HYPO 25GX1X1/2 BEV (NEEDLE) IMPLANT
NEEDLE HYPO 25GX1X1/2 BEV (NEEDLE) IMPLANT
NS IRRIG 1000ML POUR BTL (IV SOLUTION) ×3 IMPLANT
PAD ARMBOARD 7.5X6 YLW CONV (MISCELLANEOUS) ×6 IMPLANT
PATTIES SURGICAL .5 X3 (DISPOSABLE) ×3 IMPLANT
SOLUTION ANTI FOG 6CC (MISCELLANEOUS) IMPLANT
SURGILUBE 2OZ TUBE FLIPTOP (MISCELLANEOUS) IMPLANT
SUT SILK 2 0 PERMA HAND 18 BK (SUTURE) IMPLANT
TOWEL OR 17X24 6PK STRL BLUE (TOWEL DISPOSABLE) ×6 IMPLANT
TUBE CONNECTING 12'X1/4 (SUCTIONS) ×1
TUBE CONNECTING 12X1/4 (SUCTIONS) ×2 IMPLANT
WATER STERILE IRR 1000ML POUR (IV SOLUTION) ×3 IMPLANT

## 2017-11-21 NOTE — Op Note (Signed)
NAME:  [name], [name]                  ACCOUNT NO.:  MEDICAL RECORD NO.:  [id number]  LOCATION:                                 FACILITY:  PHYSICIAN:  Early Chars. Wilburn Cornelia, M.D.DATE OF BIRTH:  11-23-1961  DATE OF PROCEDURE:  11/21/2017 DATE OF DISCHARGE:                              OPERATIVE REPORT   LOCATION:  [facility] Main OR.  PREOPERATIVE DIAGNOSES: 1. Chronic progressive hoarseness. 2. Right vocal cord mass.  POSTOPERATIVE DIAGNOSES: 1. Chronic progressive hoarseness. 2. Right vocal cord mass.  INDICATION FOR SURGERY: 1. Chronic progressive hoarseness. 2. Right vocal cord mass.  PROCEDURE:  Microlaryngoscopy with biopsy of vocal cord mass.  ANESTHESIA:  General endotracheal.  COMPLICATIONS:  None.  ESTIMATED BLOOD LOSS:  Minimal.  The patient transferred from the operating room to the recovery room in stable condition.  BRIEF HISTORY:  The patient is a 56 year old male, who was referred for evaluation of progressive symptoms of hoarseness.  He has had ongoing symptoms for approximately 6 months and was seen in our office and noted to have an erythematous right vocal cord without ulceration or bleeding. He was treated with high-dose PPI therapy and return 6 weeks later for recheck.  The patient continued to have significant hoarseness and a right vocal cord lesion.  Given his history and findings, I recommended laryngoscopy for biopsy.  The risks and benefits of the procedure were discussed in detail with the patient, who understood and agreed with our plan for surgery, which was scheduled on elective basis at [place] on November 21, 2017.  DESCRIPTION OF PROCEDURE:  The patient was brought to the operating room and placed in supine position on the operating table.  General endotracheal anesthesia was established without difficulty.  A surgical time-out was then performed with correct identification of the patient and the  surgical procedure.  He was then positioned, prepped and draped and the surgical procedure was begun.  Using a Dedo laryngoscope, the oral cavity and oropharynx were examined. The patient was edentulous.  There were no mucosal lesions or ulcers. The laryngoscope was inserted full length for visualization of the larynx.  Microlaryngoscopy suspension was then placed and the operating microscope was brought into position for visualization of the larynx. The patient was noted to have an ulcerated raised lesion in the posterior third of the right vocal cord.  This area was biopsied with micro forceps and tissue was sent to pathology.  The patient also had erythematous changes and several raised spots along the left vocal cord and a biopsy was obtained on that side with micro cup forceps as well. These were sent separately to Pathology for gross microscopic evaluation.  There was no significant bleeding.  A topical lidocaine anesthetic was applied.  The patient's airway was suctioned.  Micro suspension was taken down and the laryngoscope was removed.  There was no bleeding.  There were no complications.  The patient was awakened from his anesthetic.  He was extubated and then transferred from the operating room to the recovery room in stable condition.          ______________________________ Early Chars Wilburn Cornelia, M.D.  DLS/MEDQ  D:  72/08/1067  T:  11/21/2017  Job:  [id number]

## 2017-11-21 NOTE — Anesthesia Procedure Notes (Signed)
Procedure Name: Intubation Date/Time: 11/21/2017 8:40 AM Performed by: Coury Grieger T, CRNA Pre-anesthesia Checklist: Patient identified, Emergency Drugs available, Suction available and Patient being monitored Patient Re-evaluated:Patient Re-evaluated prior to induction Oxygen Delivery Method: Circle system utilized Preoxygenation: Pre-oxygenation with 100% oxygen Induction Type: IV induction Ventilation: Mask ventilation without difficulty Laryngoscope Size: Miller and 3 Grade View: Grade I Tube type: Oral Tube size: 6.5 mm Number of attempts: 1 Airway Equipment and Method: Patient positioned with wedge pillow and Stylet Placement Confirmation: ETT inserted through vocal cords under direct vision,  positive ETCO2 and breath sounds checked- equal and bilateral Secured at: 23 cm Tube secured with: Tape Dental Injury: Teeth and Oropharynx as per pre-operative assessment

## 2017-11-21 NOTE — Anesthesia Preprocedure Evaluation (Addendum)
Anesthesia Evaluation  Patient identified by MRN, date of birth, ID band Patient awake    Reviewed: Allergy & Precautions, H&P , NPO status , Patient's Chart, lab work & pertinent test results  Airway Mallampati: II   Neck ROM: full    Dental  (+) Edentulous Upper, Edentulous Lower, Dental Advisory Given   Pulmonary shortness of breath, Current Smoker,    breath sounds clear to auscultation       Cardiovascular hypertension, + CAD, + Past MI and + Peripheral Vascular Disease   Rhythm:regular Rate:Normal  Cleared by cardiology.  Medical management for CAD.  EF 40-50%.  Stress test 2017 no reversible ischemia.   Neuro/Psych Seizures -,  PSYCHIATRIC DISORDERS Anxiety Depression Bipolar Disorder Schizophrenia  Neuromuscular disease    GI/Hepatic GERD  ,(+) Hepatitis -, C  Endo/Other    Renal/GU      Musculoskeletal  (+) Arthritis ,   Abdominal   Peds  Hematology   Anesthesia Other Findings   Reproductive/Obstetrics                            Anesthesia Physical Anesthesia Plan  ASA: III  Anesthesia Plan: General   Post-op Pain Management:    Induction: Intravenous  PONV Risk Score and Plan: 1 and Ondansetron, Dexamethasone, Midazolam and Treatment may vary due to age or medical condition  Airway Management Planned: Oral ETT  Additional Equipment:   Intra-op Plan:   Post-operative Plan: Extubation in OR  Informed Consent: I have reviewed the patients History and Physical, chart, labs and discussed the procedure including the risks, benefits and alternatives for the proposed anesthesia with the patient or authorized representative who has indicated his/her understanding and acceptance.     Plan Discussed with: CRNA, Anesthesiologist and Surgeon  Anesthesia Plan Comments:         Anesthesia Quick Evaluation

## 2017-11-21 NOTE — Anesthesia Postprocedure Evaluation (Signed)
Anesthesia Post Note  Patient: [name]  Procedure(s) Performed: MICROLARYNGOSCOPY WITH EXCISION OF VOCAL CORD LESION (N/A Throat)     Patient location during evaluation: PACU Anesthesia Type: General Level of consciousness: awake and alert Pain management: pain level controlled Vital Signs Assessment: post-procedure vital signs reviewed and stable Respiratory status: spontaneous breathing, nonlabored ventilation, respiratory function stable and patient connected to nasal cannula oxygen Cardiovascular status: blood pressure returned to baseline and stable Postop Assessment: no apparent nausea or vomiting Anesthetic complications: no    Last Vitals:  Vitals:   11/21/17 1015 11/21/17 1024  BP:  (!) 153/76  Pulse: (!) 48 (!) 48  Resp: 11 10  Temp:  (!) 36.3 C  SpO2: 99% 99%    Last Pain: There were no vitals filed for this visit.               [place]

## 2017-11-21 NOTE — Transfer of Care (Signed)
Immediate Anesthesia Transfer of Care Note  Patient: [name]  Procedure(s) Performed: MICROLARYNGOSCOPY WITH EXCISION OF VOCAL CORD LESION (N/A Throat)  Patient Location: PACU  Anesthesia Type:General  Level of Consciousness: awake and alert   Airway & Oxygen Therapy: Patient Spontanous Breathing and Patient connected to nasal cannula oxygen  Post-op Assessment: Report given to RN and Post -op Vital signs reviewed and stable  Post vital signs: Reviewed and stable  Last Vitals:  Vitals:   11/21/17 0703 11/21/17 0909  BP: 113/77 (!) 155/69  Pulse: (!) 55 60  Resp: 18 10  Temp: 36.4 C (!) 36.2 C  SpO2: 97% 98%    Last Pain: There were no vitals filed for this visit.    Patients Stated Pain Goal: 3 (61/95/09 3267)  Complications: No apparent anesthesia complications

## 2017-11-21 NOTE — Addendum Note (Signed)
Addendum  created 11/21/17 1527 by Ollen Bowl, CRNA   Intraprocedure Event edited, Intraprocedure Staff edited

## 2017-11-21 NOTE — Brief Op Note (Signed)
11/21/2017  9:01 AM  PATIENT:  [name]  56 y.o. male  PRE-OPERATIVE DIAGNOSIS:  vocal cord lesion  POST-OPERATIVE DIAGNOSIS:  vocal cord lesion  PROCEDURE:  MICROLARYGOSCOPY WITH BIOPSY OF VOCAL CORD LESIONS  SURGEON:  Surgeon(s) and Role:    Jerrell Belfast, MD - Primary  PHYSICIAN ASSISTANT:   ASSISTANTS: none   ANESTHESIA:   general  EBL: none  BLOOD ADMINISTERED:none  DRAINS: none   LOCAL MEDICATIONS USED:  OTHER Topical lidocaine  SPECIMEN:  Source of Specimen:  Bil vocal cord lesions  DISPOSITION OF SPECIMEN:  PATHOLOGY  COUNTS:  YES  TOURNIQUET:  * No tourniquets in log *  DICTATION: .Other Dictation: Dictation Number [id number]  PLAN OF CARE: Discharge to home after PACU  PATIENT DISPOSITION:  PACU - hemodynamically stable.   Delay start of Pharmacological VTE agent (>24hrs) due to surgical blood loss or risk of bleeding: not applicable

## 2017-11-21 NOTE — H&P (Signed)
[name] is an 56 y.o. male.   Chief Complaint: Chronic hoarseness  HPI: Prog hoarseness, no resp sx.  Past Medical History:  Diagnosis Date  . Anxiety   . Bipolar affective ([place])    takes Lithium daily  . Broken fingers   . Coronary artery disease    a. s/p PCI to RCA at [facility]; b. s/p PCI to RCA '08; c. Cath 05/2012 showed known occluded LCx, RCA stent w/ mild restenosis, LAD no significant dz, EF 35%; d. Cardiac cath in July of 2014 showed no significant change.   . Depression   . GERD (gastroesophageal reflux disease)    takes Protonix daily  . Hepatitis C    "diagnosed in the past 2 wk" (10/22/2012)  . History of ETOH abuse    quit 2007  . Hyperlipidemia   . Hypertension    "pt. denies - no longer taking blood pressure medications"  . Myocardial infarction ([place]) 1996; 1997; 2000's   "total of 3" (10/22/2012)  . PAD (peripheral artery disease) ([place])    2010 PTA & stent to left CIA & left SFA; 05/2012 PTA & stent to distal LCIA; 10/2012 thrombectomy and stents to [place]  . Schizophrenia ([place])   . Seizures ([place])    "when I get anxious" (Stated last seizure three months ago)  . Tobacco abuse   . Urinary frequency     Past Surgical History:  Procedure Laterality Date  . ABDOMINAL AORTAGRAM N/A 10/22/2012   Procedure: ABDOMINAL Maxcine Ham;  Surgeon: Wellington Hampshire, MD;  Location: [place] CATH LAB;  Service: Cardiovascular;  Laterality: N/A;  . ABDOMINAL AORTAGRAM N/A 11/11/2014   Procedure: ABDOMINAL Maxcine Ham;  Surgeon: Wellington Hampshire, MD;  Location: [place] CATH LAB;  Service: Cardiovascular;  Laterality: N/A;  . ANTERIOR CERVICAL DECOMP/DISCECTOMY FUSION  01/2011   plate & screw placement   . ANTERIOR CERVICAL DECOMP/DISCECTOMY FUSION N/A 01/26/2017   Procedure: ANTERIOR CERVICAL DECOMPRESSION/DISCECTOMY FUSION CERVICAL FOUR- CERVICAL FIVE;  Surgeon: Ashok Pall, MD;  Location: [place];  Service: Neurosurgery;  Laterality: N/A;  ANTERIOR CERVICAL  DECOMPRESSION/DISCECTOMY FUSION CERVICAL FOUR- CERVICAL FIVE  . AORTA - BILATERAL FEMORAL ARTERY BYPASS GRAFT N/A 11/20/2014   Procedure: AORTA BIFEMORAL BYPASS GRAFT;  Surgeon: Mal Misty, MD;  Location: [place];  Service: Vascular;  Laterality: N/A;  . BACK SURGERY    . BRAIN SURGERY  1989   subdural hematoma  . CARDIAC CATHETERIZATION     Stent - 12  . CHOLECYSTECTOMY N/A 06/29/2017   Procedure: LAPAROSCOPIC CHOLECYSTECTOMY WITH INTRAOPERATIVE CHOLANGIOGRAM, POSSIBLE OPEN;  Surgeon: Robert Bellow, MD;  Location: [facility] ORS;  Service: General;  Laterality: N/A;  . CORONARY ANGIOPLASTY WITH STENT PLACEMENT  03/2007   to RCA  . HERNIA REPAIR    . ILIAC ARTERY STENT  10/22/2012   "2" (10/22/2012)  . ILIAC ARTERY STENT  06/2012   left  . INCISIONAL HERNIA REPAIR N/A 11/08/2015   Procedure: INCISIONAL HERNIA REPAIR WITH MYOFASCIAL RELEASE;  Surgeon: Rolm Bookbinder, MD;  Location: [place];  Service: General;  Laterality: N/A;  . INSERTION OF MESH N/A 11/08/2015   Procedure: INSERTION OF MESH;  Surgeon: Rolm Bookbinder, MD;  Location: [place];  Service: General;  Laterality: N/A;  . LEFT HEART CATHETERIZATION WITH CORONARY ANGIOGRAM N/A 05/29/2012   Procedure: LEFT HEART CATHETERIZATION WITH CORONARY ANGIOGRAM;  Surgeon: Wellington Hampshire, MD;  Location: [place] CATH LAB;  Service: Cardiovascular;  Laterality: N/A;  . LIVER BIOPSY  10/2012  .  LOWER EXTREMITY ANGIOGRAM N/A 05/29/2012   Procedure: LOWER EXTREMITY ANGIOGRAM;  Surgeon: Wellington Hampshire, MD;  Location: [place] CATH LAB;  Service: Cardiovascular;  Laterality: N/A;  . PERCUTANEOUS STENT INTERVENTION Left 05/29/2012   Procedure: PERCUTANEOUS STENT INTERVENTION;  Surgeon: Wellington Hampshire, MD;  Location: [place] CATH LAB;  Service: Cardiovascular;  Laterality: Left;  . POSTERIOR FUSION CERVICAL SPINE  2012  . SUBDURAL HEMATOMA EVACUATION VIA CRANIOTOMY      Family History  Problem Relation Age of Onset  . Emphysema Father   . COPD Mother   .  Diabetes Other   . Alcohol abuse Other   . Hypertension Other   . Hyperlipidemia Other   . Seizures Other    Social History:  reports that he has been smoking cigarettes.  He has a 40.00 pack-year smoking history. he has never used smokeless tobacco. He reports that he drinks about 3.6 oz of alcohol per week. He reports that he does not use drugs.  Allergies: No Known Allergies  Medications Prior to Admission  Medication Sig Dispense Refill  . atorvastatin (LIPITOR) 20 MG tablet TAKE 1 TABLET (20 MG TOTAL) BY MOUTH DAILY. 30 tablet 3  . buPROPion (WELLBUTRIN XL) 150 MG 24 hr tablet Take 150 mg by mouth every morning.  2  . carvedilol (COREG) 6.25 MG tablet TAKE 1 TABLET (6.25 MG TOTAL) BY MOUTH 2 (TWO) TIMES DAILY WITH A MEAL. 60 tablet 3  . divalproex (DEPAKOTE ER) 500 MG 24 hr tablet Take 500 mg by mouth 2 (two) times daily.   2  . lithium carbonate (ESKALITH) 450 MG CR tablet Take 450 mg by mouth 2 (two) times daily.    Marland Kitchen losartan (COZAAR) 100 MG tablet Take 1 tablet (100 mg total) by mouth daily. 90 tablet 3  . pantoprazole (PROTONIX) 40 MG tablet Take 40 mg by mouth daily at 2 PM. In the afternoon.  3    Results for orders placed or performed during the hospital encounter of 11/20/17 (from the past 48 hour(s))  CBC     Status: Abnormal   Collection Time: 11/20/17  3:29 PM  Result Value Ref Range   WBC 7.0 4.0 - 10.5 K/uL   RBC 4.58 4.22 - 5.81 MIL/uL   Hemoglobin 15.0 13.0 - 17.0 g/dL   HCT 44.8 39.0 - 52.0 %   MCV 97.8 78.0 - 100.0 fL   MCH 32.8 26.0 - 34.0 pg   MCHC 33.5 30.0 - 36.0 g/dL   RDW 12.7 11.5 - 15.5 %   Platelets 145 (L) 150 - 400 K/uL  Comprehensive metabolic panel     Status: Abnormal   Collection Time: 11/20/17  3:29 PM  Result Value Ref Range   Sodium 137 135 - 145 mmol/L   Potassium 4.6 3.5 - 5.1 mmol/L   Chloride 105 101 - 111 mmol/L   CO2 27 22 - 32 mmol/L   Glucose, Bld 104 (H) 65 - 99 mg/dL   BUN 7 6 - 20 mg/dL   Creatinine, Ser 1.06 0.61 - 1.24  mg/dL   Calcium 9.8 8.9 - 10.3 mg/dL   Total Protein 6.9 6.5 - 8.1 g/dL   Albumin 4.0 3.5 - 5.0 g/dL   AST 22 15 - 41 U/L   ALT 22 17 - 63 U/L   Alkaline Phosphatase 47 38 - 126 U/L   Total Bilirubin 1.1 0.3 - 1.2 mg/dL   GFR calc non Af Amer >60 >60 mL/min   GFR calc Af  Amer >60 >60 mL/min    Comment: (NOTE) The eGFR has been calculated using the CKD EPI equation. This calculation has not been validated in all clinical situations. eGFR's persistently <60 mL/min signify possible Chronic Kidney Disease.    Anion gap 5 5 - 15  PT- INR at PAT visit (Pre-admission Testing)     Status: None   Collection Time: 11/20/17  3:29 PM  Result Value Ref Range   Prothrombin Time 12.8 11.4 - 15.2 seconds   INR 0.97    No results found.  Review of Systems  Constitutional: Negative.   HENT: Negative.   Respiratory: Negative.   Cardiovascular: Negative.     Blood pressure 113/77, pulse (!) 55, temperature 97.6 F (36.4 C), resp. rate 18, SpO2 97 %. Physical Exam  Constitutional: He appears well-developed and well-nourished.  HENT:  Vocal cord lesion  Neck: Normal range of motion. Neck supple.  Cardiovascular: Normal rate.  Respiratory: Effort normal.  GI: Soft.     Assessment/Plan Adm for OP DL and VC biopsy.  Cara Thaxton, MD 11/21/2017, 8:18 AM

## 2017-11-22 ENCOUNTER — Encounter (HOSPITAL_COMMUNITY): Payer: Self-pay | Admitting: Otolaryngology

## 2017-12-02 ENCOUNTER — Other Ambulatory Visit: Payer: Self-pay | Admitting: Cardiovascular Disease

## 2017-12-03 ENCOUNTER — Telehealth: Payer: Self-pay | Admitting: *Deleted

## 2017-12-03 NOTE — Telephone Encounter (Signed)
Oncology Nurse Navigator Documentation  Placed introductory call to new referral patient.  Introduced myself as the H&N oncology nurse navigator that works with Dr. Isidore Moos to whom he has been referred by Dr. Wilburn Cornelia.  He confirmed his understanding of referral and appt date/time of 12/11/17 12:30.  I briefly explained my role as his navigator, indicated that I would be joining him during his appt next week.  I confirmed his understanding of [place] location, explained arrival and RadOnc registration process for appt.  I provided my contact information, encouraged him to call with questions/concerns before next week.  Of note:  He indicated he does not drive, will need assistance with transportation.  He stated [facility] wd be closer to his home in [place], [place] prefer tmt there if possible.  I noted this can be discussed during appt with Dr. Isidore Moos.  He voiced understanding.  He verbalized understanding of information provided, expressed appreciation for my call.  Gayleen Orem, RN, BSN, [place] Oncology Nurse [facility] [phone]

## 2017-12-05 NOTE — Progress Notes (Signed)
Head and Neck Cancer Location of Tumor / Histology:  11/21/17 Diagnosis 1. Vocal cord, biopsy, Right - SQUAMOUS CELL CARCINOMA. - SEE COMMENT. 2. Vocal cord, biopsy, Left - SQUAMOUS CELL CARCINOMA.  Patient presented with symptoms of: He presented to Dr. Wilburn Cornelia on 09/11/17 after being referred by Dr. Christella Noa. Mr. Gellner reported chronic hoarseness since anterior cervical fusion surgery 01/2017. Laryngoscope at that time was negative for cancer and it was recommended he return in 2 months for follow up. On 11/14/17 he presented to Dr. Wilburn Cornelia with continued reports of hoarseness and also raspy voice and mild sore throat. A second Laryngoscope was performed and Dr. Wilburn Cornelia recommeded a microlarynoscopy at [facility] .  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   Nutrition Status Yes No Comments  Weight changes? []  [x]    Swallowing concerns? []  [x]    PEG? []  [x]     Referrals Yes No Comments  Social Work? []  [x]    Dentistry? []  [x]  edentolous  Swallowing therapy? []  [x]    Nutrition? []  [x]    Med/Onc? []  [x]     Safety Issues Yes No Comments  Prior radiation? []  [x]    Pacemaker/ICD? []  [x]    Possible current pregnancy? []  [x]    Is the patient on methotrexate? []  [x]     Tobacco/Marijuana/Snuff/ETOH use: He is a current everyday smoker about one pack daily. He drinks about 6 beers weekly.   Past/Anticipated interventions by otolaryngology, if any:  11/21/17 Dr. Wilburn Cornelia: PROCEDURE:  Microlaryngoscopy with biopsy of vocal cord mass   Past/Anticipated interventions by medical oncology, if any:  None  Current Complaints / other details:    BP (!) 135/100   Pulse 80   Temp 97.9 F (36.6 C)   Ht 5\' 7"  (1.702 m)   Wt 160 lb 3.2 oz (72.7 kg)   SpO2 99% Comment: room air  BMI 25.09 kg/m    Wt Readings from Last 3 Encounters:  12/11/17 160 lb 3.2 oz (72.7 kg)  11/20/17 161 lb 4.8 oz (73.2 kg)  07/26/17 158 lb 12 oz (72 kg)

## 2017-12-11 ENCOUNTER — Ambulatory Visit
Admission: RE | Admit: 2017-12-11 | Discharge: 2017-12-11 | Disposition: A | Discharge status: Home / Self Care | Payer: Medicaid Other | Source: Ambulatory Visit | Attending: Radiation Oncology | Admitting: Radiation Oncology

## 2017-12-11 ENCOUNTER — Encounter: Payer: Self-pay | Admitting: *Deleted

## 2017-12-11 ENCOUNTER — Encounter: Payer: Self-pay | Admitting: General Practice

## 2017-12-11 ENCOUNTER — Encounter: Payer: Self-pay | Admitting: Radiation Oncology

## 2017-12-11 ENCOUNTER — Ambulatory Visit: Payer: Medicaid Other | Admitting: Cardiovascular Disease

## 2017-12-11 VITALS — BP 135/100 | HR 80 | Temp 97.9°F | Ht 67.0 in | Wt 160.2 lb

## 2017-12-11 DIAGNOSIS — F1010 Alcohol abuse, uncomplicated: Secondary | ICD-10-CM | POA: Insufficient documentation

## 2017-12-11 DIAGNOSIS — Z1329 Encounter for screening for other suspected endocrine disorder: Secondary | ICD-10-CM

## 2017-12-11 DIAGNOSIS — C320 Malignant neoplasm of glottis: Secondary | ICD-10-CM | POA: Insufficient documentation

## 2017-12-11 DIAGNOSIS — G40909 Epilepsy, unspecified, not intractable, without status epilepticus: Secondary | ICD-10-CM | POA: Insufficient documentation

## 2017-12-11 DIAGNOSIS — K219 Gastro-esophageal reflux disease without esophagitis: Secondary | ICD-10-CM | POA: Diagnosis not present

## 2017-12-11 DIAGNOSIS — R350 Frequency of micturition: Secondary | ICD-10-CM | POA: Insufficient documentation

## 2017-12-11 DIAGNOSIS — F419 Anxiety disorder, unspecified: Secondary | ICD-10-CM | POA: Insufficient documentation

## 2017-12-11 DIAGNOSIS — Z79899 Other long term (current) drug therapy: Secondary | ICD-10-CM | POA: Diagnosis not present

## 2017-12-11 DIAGNOSIS — F3170 Bipolar disorder, currently in remission, most recent episode unspecified: Secondary | ICD-10-CM

## 2017-12-11 DIAGNOSIS — F319 Bipolar disorder, unspecified: Secondary | ICD-10-CM | POA: Insufficient documentation

## 2017-12-11 DIAGNOSIS — F209 Schizophrenia, unspecified: Secondary | ICD-10-CM | POA: Insufficient documentation

## 2017-12-11 DIAGNOSIS — I2510 Atherosclerotic heart disease of native coronary artery without angina pectoris: Secondary | ICD-10-CM | POA: Insufficient documentation

## 2017-12-11 DIAGNOSIS — F17210 Nicotine dependence, cigarettes, uncomplicated: Secondary | ICD-10-CM | POA: Insufficient documentation

## 2017-12-11 DIAGNOSIS — Z510 Encounter for antineoplastic radiation therapy: Secondary | ICD-10-CM | POA: Diagnosis present

## 2017-12-11 DIAGNOSIS — I739 Peripheral vascular disease, unspecified: Secondary | ICD-10-CM | POA: Diagnosis not present

## 2017-12-11 DIAGNOSIS — I252 Old myocardial infarction: Secondary | ICD-10-CM | POA: Insufficient documentation

## 2017-12-11 DIAGNOSIS — E785 Hyperlipidemia, unspecified: Secondary | ICD-10-CM | POA: Diagnosis not present

## 2017-12-11 DIAGNOSIS — Z8781 Personal history of (healed) traumatic fracture: Secondary | ICD-10-CM | POA: Insufficient documentation

## 2017-12-11 DIAGNOSIS — Z8619 Personal history of other infectious and parasitic diseases: Secondary | ICD-10-CM | POA: Insufficient documentation

## 2017-12-11 DIAGNOSIS — Z8659 Personal history of other mental and behavioral disorders: Secondary | ICD-10-CM

## 2017-12-11 DIAGNOSIS — I10 Essential (primary) hypertension: Secondary | ICD-10-CM | POA: Insufficient documentation

## 2017-12-11 DIAGNOSIS — J383 Other diseases of vocal cords: Secondary | ICD-10-CM

## 2017-12-11 MED ORDER — NICOTINE 14 MG/24HR TD PT24: 14.0000 mg | MEDICATED_PATCH | Freq: Every day | TRANSDERMAL | 0 refills | Status: DC

## 2017-12-11 MED ORDER — LARYNGOSCOPY SOLUTION RAD-ONC
15.0000 mL | Freq: Once | TOPICAL | Status: DC
  Filled 2017-12-11: qty 15

## 2017-12-11 MED ORDER — NICOTINE 21 MG/24HR TD PT24: 21.0000 mg | MEDICATED_PATCH | Freq: Every day | TRANSDERMAL | 2 refills | Status: DC

## 2017-12-11 MED ORDER — NICOTINE 7 MG/24HR TD PT24: 7.0000 mg | MEDICATED_PATCH | Freq: Every day | TRANSDERMAL | 0 refills | Status: DC

## 2017-12-11 NOTE — Progress Notes (Signed)
Radiation Oncology         [phone] ________________________________  Initial outpatient Consultation  Name: [name] MRN: [id number]  Date: 12/11/2017  DOB: [birth year]  CC:Gayland Curry, MD  Jerrell Belfast, MD   REFERRING PHYSICIAN: Jerrell Belfast, MD  DIAGNOSIS:    ICD-10-CM   1. 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 Screening for hypothyroidism Z13.29 TSH  Cancer Staging Malignant neoplasm of glottis (HCC) Staging form: Larynx - Glottis, AJCC 8th Edition - Clinical: Stage I (cT1b, cN0, cM0) - Signed by Eppie Gibson, MD on 12/11/2017   CHIEF COMPLAINT: Here to discuss management of vocal cord cancer  HISTORY OF PRESENT ILLNESS::[name] is a 57 y.o. male who presented with chronic hoarseness since anterior cervical fusion surgery in February 2018 with Dr. Christella Noa. Laryngoscope at that time was negative for cancer or nerve dysfunction as a source for chronic hoarsenss. Findings on laryngoscopy were consistent with reflux related changes and possible leukoplakic changes along the right-hand side. The patient was advised to discontinue cigarette smoking and change his Protonix to 5 PM.   Subsequently, the patient saw Dr. Wilburn Cornelia on 09/11/17 for continued hoarseness, raspy voice, and mild sore throat. Per Dr. Victorio Palm note, the physical exam had findings consistent with gastroesophageal reflux. He was treated with  aggressive reflux therapy. He returned to Dr. Wilburn Cornelia for follow-up with unchanged symptoms on 11/14/17. A second laryngoscope was performed revealing an ulcerated lesion in the posterior third of the right vocal cord and several raised spots with erythema along the left vocal cord. No comment about additional lesions in the throat.    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 Occasional headaches and seizures. Depression.  He denies shortness of breath, rashes, or hemoptysis.  Tobacco history, if any: One pack per day for 40 years  ETOH abuse, if any: Twelve beers weekly  Prior cancers, if any: None  PREVIOUS RADIATION THERAPY: No  PAST MEDICAL HISTORY:  has a past medical history of Anxiety, Bipolar affective ([place]), Broken fingers, Coronary artery disease, Depression, GERD (gastroesophageal reflux disease), Hepatitis C, History of ETOH abuse, Hyperlipidemia, Hypertension, Myocardial infarction ([place]) (1996; 1997; 2000's), PAD (peripheral artery disease) ([place]), Schizophrenia ([place]), Seizures ([place]), Tobacco abuse, and Urinary frequency.    PAST SURGICAL HISTORY: Past Surgical History:  Procedure Laterality Date  . ABDOMINAL AORTAGRAM N/A 10/22/2012   Procedure: ABDOMINAL Maxcine Ham;  Surgeon: Wellington Hampshire, MD;  Location: [place] CATH LAB;  Service: Cardiovascular;  Laterality: N/A;  . ABDOMINAL AORTAGRAM N/A 11/11/2014   Procedure: ABDOMINAL Maxcine Ham;  Surgeon: Wellington Hampshire, MD;  Location: [place] CATH LAB;  Service: Cardiovascular;  Laterality: N/A;  . ANTERIOR CERVICAL DECOMP/DISCECTOMY FUSION  01/2011   plate & screw placement   . ANTERIOR CERVICAL DECOMP/DISCECTOMY FUSION N/A 01/26/2017   Procedure: ANTERIOR CERVICAL DECOMPRESSION/DISCECTOMY FUSION CERVICAL FOUR- CERVICAL FIVE;  Surgeon: Ashok Pall, MD;  Location: [place];  Service:  Neurosurgery;  Laterality: N/A;  ANTERIOR CERVICAL DECOMPRESSION/DISCECTOMY FUSION CERVICAL FOUR- CERVICAL FIVE  . AORTA - BILATERAL FEMORAL ARTERY BYPASS GRAFT N/A 11/20/2014   Procedure: AORTA BIFEMORAL BYPASS GRAFT;  Surgeon: Mal Misty, MD;  Location: [place];  Service: Vascular;  Laterality: N/A;  . BACK SURGERY    . BRAIN SURGERY  1989   subdural hematoma  . CARDIAC CATHETERIZATION     Stent - 12  . CHOLECYSTECTOMY N/A 06/29/2017   Procedure: LAPAROSCOPIC CHOLECYSTECTOMY WITH INTRAOPERATIVE CHOLANGIOGRAM, POSSIBLE OPEN;  Surgeon: Robert Bellow, MD;  Location: [facility] ORS;  Service: General;  Laterality: N/A;  . CORONARY ANGIOPLASTY WITH STENT PLACEMENT  03/2007   to RCA  . HERNIA REPAIR    . ILIAC ARTERY STENT  10/22/2012   "2" (10/22/2012)  . ILIAC ARTERY STENT  06/2012   left  . INCISIONAL HERNIA REPAIR N/A 11/08/2015   Procedure: INCISIONAL HERNIA REPAIR WITH MYOFASCIAL RELEASE;  Surgeon: Rolm Bookbinder, MD;  Location: [place];  Service: General;  Laterality: N/A;  . INSERTION OF MESH N/A 11/08/2015   Procedure: INSERTION OF MESH;  Surgeon: Rolm Bookbinder, MD;  Location: [place];  Service: General;  Laterality: N/A;  . LEFT HEART CATHETERIZATION WITH CORONARY ANGIOGRAM N/A 05/29/2012   Procedure: LEFT HEART CATHETERIZATION WITH CORONARY ANGIOGRAM;  Surgeon: Wellington Hampshire, MD;  Location: [place] CATH LAB;  Service: Cardiovascular;  Laterality: N/A;  . LIVER BIOPSY  10/2012  . LOWER EXTREMITY ANGIOGRAM N/A 05/29/2012   Procedure: LOWER EXTREMITY ANGIOGRAM;  Surgeon: Wellington Hampshire, MD;  Location: [place] CATH LAB;  Service: Cardiovascular;  Laterality: N/A;  . MICROLARYNGOSCOPY WITH CO2 LASER AND EXCISION OF VOCAL CORD LESION N/A 11/21/2017   Procedure: MICROLARYNGOSCOPY WITH EXCISION OF VOCAL CORD LESION;  Surgeon: Jerrell Belfast, MD;  Location: [place];  Service: ENT;  Laterality: N/A;  . PERCUTANEOUS STENT INTERVENTION Left 05/29/2012   Procedure: PERCUTANEOUS STENT INTERVENTION;   Surgeon: Wellington Hampshire, MD;  Location: [place] CATH LAB;  Service: Cardiovascular;  Laterality: Left;  . POSTERIOR FUSION CERVICAL SPINE  2012  . SUBDURAL HEMATOMA EVACUATION VIA CRANIOTOMY      FAMILY HISTORY: family history includes Alcohol abuse in his other; COPD in his mother; Diabetes in his other; Emphysema in his father; Hyperlipidemia in his other; Hypertension in his other; Seizures in his other.  SOCIAL HISTORY:  reports that he has been smoking cigarettes.  He has a 40.00 pack-year smoking history. he has never used smokeless tobacco. He reports that he drinks about 3.6 oz of alcohol per week. He reports that he does not use drugs.  ALLERGIES: Lisinopril  MEDICATIONS:  Current Outpatient Medications  Medication Sig Dispense Refill  . atorvastatin (LIPITOR) 20 MG tablet TAKE 1 TABLET (20 MG TOTAL) BY MOUTH DAILY. 30 tablet 3  . buPROPion (WELLBUTRIN XL) 150 MG 24 hr tablet Take 150 mg by mouth every morning.  2  . carvedilol (COREG) 6.25 MG tablet TAKE 1 TABLET (6.25 MG TOTAL) BY MOUTH 2 (TWO) TIMES DAILY WITH A MEAL. 60 tablet 3  . divalproex (DEPAKOTE ER) 500 MG 24 hr tablet Take 500 mg by mouth 2 (two) times daily.   2  . lithium carbonate (ESKALITH) 450 MG CR tablet Take 450 mg by mouth 2 (two) times  daily.    . losartan (COZAAR) 100 MG tablet Take 1 tablet (100 mg total) by mouth daily. 90 tablet 3  . pantoprazole (PROTONIX) 40 MG tablet Take 40 mg by mouth daily at 2 PM. In the afternoon.  3  . nicotine (NICODERM CQ - DOSED IN MG/24 HOURS) 14 mg/24hr patch Place 1 patch (14 mg total) onto the skin daily. Apply 21 mg patch daily x 6 wk, then 14mg  patch daily x 2 wk, then 7 mg patch daily x 2 wk 14 patch 0  . nicotine (NICODERM CQ - DOSED IN MG/24 HOURS) 21 mg/24hr patch Place 1 patch (21 mg total) onto the skin daily. Apply 21 mg patch daily x 6 wk, then 14mg  patch daily x 2 wk, then 7 mg patch daily x 2 wk 14 patch 2  . nicotine (NICODERM CQ - DOSED IN MG/24 HR) 7 mg/24hr patch  Place 1 patch (7 mg total) onto the skin daily. Apply 21 mg patch daily x 6 wk, then 14mg  patch daily x 2 wk, then 7 mg patch daily x 2 wk 14 patch 0   Current Facility-Administered Medications  Medication Dose Route Frequency Provider Last Rate Last Dose  . laryngocopy solution for Rad-Onc  15 mL Topical Once Eppie Gibson, MD        REVIEW OF SYSTEMS:  A 10+ POINT REVIEW OF SYSTEMS WAS OBTAINED including neurology, dermatology, psychiatry, cardiac, respiratory, lymph, extremities, GI, GU, Musculoskeletal, constitutional,   HEENT.  All pertinent positives are noted in the HPI.  All others are negative.   PHYSICAL EXAM:  height is 5\' 7"  (1.702 m) and weight is 160 lb 3.2 oz (72.7 kg). His temperature is 97.9 F (36.6 C). His blood pressure is 135/100 (abnormal) and his pulse is 80. His oxygen saturation is 99%.   General: Alert and oriented, in no acute distress HEENT: Head is normocephalic. Extraocular movements are intact. Patient is edentulous. Mucous membranes are moist. Neck: Neck is notable for no palpable masses. Heart: Regular in rate and rhythm with no murmurs, rubs, or gallops. Chest: Clear to auscultation bilaterally, with no rhonchi, wheezes, or rales. Abdomen: Vertical incisional scar in his abdomen. His abdomen is moderately distended and mildly tender with palpation. Extremities: No cyanosis or edema. Lymphatics: see Neck Exam Skin: No concerning lesions. Musculoskeletal: symmetric strength and muscle tone throughout. Neurologic:   No obvious focalities. Speech is fluent.   + tardive dyskinesia, mouth Psychiatric:  Affect is slighted blunted  PROCEDURE NOTE: After obtaining consent and anesthetizing the nasal cavity with topical lidocaine and phenylephrine, the flexible endoscope was introduced and passed through the nasal cavity.  The vocal cords are symmetrically mobile. White nodules over posterior half of RIGHT vocal fold as well as  erythema and swelling of vocal folds  bilaterally. No other lesions appreciated in pharynx or larynx.   ECOG = 2  0 - Asymptomatic (Fully active, able to carry on all predisease activities without restriction)  1 - Symptomatic but completely ambulatory (Restricted in physically strenuous activity but ambulatory and able to carry out work of a light or sedentary nature. For example, light housework, office work)  2 - Symptomatic, <50% in bed during the day (Ambulatory and capable of all self care but unable to carry out any work activities. Up and about more than 50% of waking hours)  3 - Symptomatic, >50% in bed, but not bedbound (Capable of only limited self-care, confined to bed or chair 50% or more of waking hours)  4 - Bedbound (Completely disabled. Cannot carry on any self-care. Totally confined to bed or chair)  5 - Death   Eustace Pen MM, Creech RH, Tormey DC, et al. [phone]). "Toxicity and response criteria of the [facility]". [place] Oncol. 5 (6): 649-55   LABORATORY DATA:  Lab Results  Component Value Date   WBC 7.0 11/20/2017   HGB 15.0 11/20/2017   HCT 44.8 11/20/2017   MCV 97.8 11/20/2017   PLT 145 (L) 11/20/2017   CMP     Component Value Date/Time   NA 137 11/20/2017 1529   NA 143 09/22/2016 0940   NA 139 05/01/2014 2240   K 4.6 11/20/2017 1529   K 4.2 05/01/2014 2240   CL 105 11/20/2017 1529   CL 108 (H) 05/01/2014 2240   CO2 27 11/20/2017 1529   CO2 27 05/01/2014 2240   GLUCOSE 104 (H) 11/20/2017 1529   GLUCOSE 127 (H) 05/01/2014 2240   BUN 7 11/20/2017 1529   BUN 4 (L) 09/22/2016 0940   BUN 9 05/01/2014 2240   CREATININE 1.06 11/20/2017 1529   CREATININE 1.05 05/01/2014 2240   CALCIUM 9.8 11/20/2017 1529   CALCIUM 9.3 05/01/2014 2240   PROT 6.9 11/20/2017 1529   PROT 7.6 05/01/2014 2240   ALBUMIN 4.0 11/20/2017 1529   ALBUMIN 3.6 05/01/2014 2240   AST 22 11/20/2017 1529   AST 38 (H) 05/01/2014 2240   ALT 22 11/20/2017 1529   ALT 67 05/01/2014 2240   ALKPHOS 47  11/20/2017 1529   ALKPHOS 62 05/01/2014 2240   BILITOT 1.1 11/20/2017 1529   BILITOT 1.2 (H) 05/01/2014 2240   GFRNONAA >60 11/20/2017 1529   GFRNONAA >60 05/01/2014 2240   GFRAA >60 11/20/2017 1529   GFRAA >60 05/01/2014 2240         RADIOGRAPHY: No results found.    IMPRESSION/PLAN:  This is a delightful patient with glottic  head and neck cancer. I recommend radiotherapy for this patient. I anticipate 6 weeks of treatment. Schedule chest x-ray at [facility] prior to the start of radiation.   We discussed the potential risks, benefits, and side effects of radiotherapy. We talked in detail about acute and late effects. We discussed that some of the most bothersome acute effects may be mucositis, dysgeusia, salivary changes, skin irritation, hair loss, dehydration, weight loss and fatigue. We talked about late effects which include but are not necessarily limited to dysphagia, hypothyroidism, nerve injury, spinal cord injury, xerostomia, trismus, and neck edema. No guarantees of treatment were given. A consent form was signed and placed in the patient's medical record. The patient is enthusiastic about proceeding with treatment. I look forward to participating in the patient's care.    Simulation (treatment planning) will occur today.  Advised patient to verify with his prescribing physician the use of Buproprion given the patient's history of seizures.  I asked the patient today about tobacco use. The patient uses tobacco.  I advised the patient to quit. Services were offered by me today including prescription Nicotine patches. I assessed for the willingness to attempt to quit and provided encouragement and demonstrated willingness to make referrals and/or prescriptions to help the patient attempt to quit. Over 5 minutes were spent on this issue. He would like to quit smoking. Mr. Masterson expressed he is open to quitting and stated that he will quit on 12/25/17.  He will call [id number].  Nicotine patches Rxd.  We also discussed that the treatment of head and neck  cancer is a multidisciplinary process to maximize treatment outcomes and quality of life. For this reasons the following referrals have been or will be made:   Nutritionist for nutrition support during and after treatment.   Speech language pathology for swallowing and/or speech therapy.   Social work for social support.   Referral to psychiatry for psych management.   Physical therapy due to risk of lymphedema in neck and deconditioning.   Baseline labs including TSH at [facility].  Message left at Dr. Victorio Palm office to discuss case. __________________________________________   Eppie Gibson, MD   This document serves as a record of services personally performed by Eppie Gibson, MD. It was created on her behalf by Bethann Humble, a trained medical scribe. The creation of this record is based on the scribe's personal observations and the provider's statements to them. This document has been checked and approved by the attending provider.

## 2017-12-11 NOTE — Progress Notes (Signed)
[place] Psychosocial Distress Screening Clinical Social Work  Clinical Social Work was referred by distress screening protocol.  The patient scored a 5 on the Psychosocial Distress Thermometer which indicates moderate distress. Clinical Social Worker Edwyna Shell to assess for distress and other psychosocial needs. Unable to reach patient, left VM requesting call back to assess for needs/resources.   ONCBCN DISTRESS SCREENING 12/11/2017  Screening Type Initial Screening  Distress experienced in past week (1-10) 5  Practical problem type Transportation  Emotional problem type Depression;Nervousness/Anxiety  Spiritual/Religous concerns type Relating to God  Information Concerns Type Lack of info about treatment    Clinical Social Worker follow up needed: Yes.    If yes, follow up plan: CSW will ask RN Navigator to send patient to social work during clinic visit, CSW unable to reach patient by phone.

## 2017-12-11 NOTE — Progress Notes (Signed)
  Radiation Oncology         [phone] ________________________________  Name: NAVDEEP HALT MRN: [id number]  Date: 12/11/2017  DOB: 1961/03/03  SIMULATION AND TREATMENT PLANNING NOTE  Outpatient    ICD-10-CM   1. Malignant neoplasm of glottis ([place]) C32.0     NARRATIVE:  The patient was brought to the [facility].  Identity was confirmed.  All relevant records and images related to the planned course of therapy were reviewed.  The patient freely provided informed written consent to proceed with treatment after reviewing the details related to the planned course of therapy. The consent form was witnessed and verified by the simulation staff.    Then, the patient was set-up in a stable reproducible supine position for radiation therapy.  Aquaplast head and should mask was custom fabricated for immobilization.  CT images were obtained without contrast.  Surface markings were placed.  The CT images were loaded into the planning software.    TREATMENT PLANNING NOTE: Treatment planning then occurred.  The radiation prescription was entered and confirmed.    A total of 3 medically necessary complex treatment devices were fabricated and supervised by me (2 wedges for the opposed fields and the Aquaplast head and shoulder mask). I have requested : 3D Simulation  I have requested a DVH of the following structures: target volume, esophagus, spinal cord.  I have ordered:Nutrition Consult  The patient will receive 63 Gy in 28 fractions to the larynx.  -----------------------------------  Eppie Gibson, MD

## 2017-12-11 NOTE — Progress Notes (Signed)
Oncology Nurse Navigator Documentation  Met with Mr. Wildes during initial consult with Dr. Isidore Moos.  He was accompanied by driver arranged through SS.   1. Further introduced myself as his Navigator, explained my role as a member of the Care Team.   2. Provided New Patient Information packet, discussed contents:  Contact information for physician(s), myself, other members of the Care Team.  Advance Directive information ([place] blue pamphlet with LCSW contact info), provided [facility] AD booklet.  Fall Prevention Patient Safety Plan  Appointment Guideline  Financial Assistance Information sheet  [place] with highlight of [place]  SLP information sheet Provided introductory explanation of radiation treatment including SIM planning and purpose of Aquaplast head and shoulder mask, showed them example.   Provided information/discussed opportunities for smoking cessation support:    Child psychotherapist class/individual counseling at [facility] information 3. Of Note:  Had been seeing psychiatrist for depression but he no longer accepts Medicaid.  I will follow-up with SW s/p Dr. Pearlie Oyster referral to identify new option. 4. He proceeded to [facility] following consult. 5. I encouraged them to contact me with questions/concerns as treatments/procedures begin.  They verbalized understanding of information provided.    Gayleen Orem, RN, BSN Head & Neck Oncology Nurse [place] at [facility] [phone]

## 2017-12-12 ENCOUNTER — Encounter: Payer: Self-pay | Admitting: General Practice

## 2017-12-12 ENCOUNTER — Other Ambulatory Visit: Payer: Self-pay | Admitting: *Deleted

## 2017-12-12 NOTE — Progress Notes (Signed)
[place] Psychosocial Distress Screening Clinical Social Work  Clinical Social Work was referred by distress screening protocol.  The patient scored a 5 on the Psychosocial Distress Thermometer which indicates moderate distress. Clinical Social Worker Edwyna Shell to assess for distress and other psychosocial needs. CSW and patient discussed common feeling and emotions when being diagnosed with cancer, and the importance of support during treatment. CSW informed patient of the support team and support services at [facility]. CSW provided contact information and encouraged patient to call with any questions or concerns.  States he lives w a friend who is very supportive, friend will transport to appointments. Patient does not anticipate any issues w getting to treatments or having needed support at home.  CSW reviewed [place] services and how to access, mailed patient information packet.    ONCBCN DISTRESS SCREENING 12/11/2017  Screening Type Initial Screening  Distress experienced in past week (1-10) 5  Practical problem type Transportation  Emotional problem type Depression;Nervousness/Anxiety  Spiritual/Religous concerns type Relating to God  Information Concerns Type Lack of info about treatment    Clinical Social Worker follow up needed: No.   Edwyna Shell, [place] Worker Phone:  [phone]   If yes, follow up plan:

## 2017-12-12 NOTE — Progress Notes (Signed)
[place] CSW Progress Note  Call from Gayleen Orem, Engineer, site.  Patient needs referral for mental health medications management.  CSW spoke w patient - was formerly at [facility] for services, agency no longer takes his insurance.  Appt made at [facility] ([address], [place] ) for Friday 1/11 at 10 AM.  Pt also referred to [facility] for [facility] for outpatient case management services. Pt aware he will receive screening call from [facility].  Pt states he has friend who will take him to appointments, CSW also reminded patient about availability of Medicaid transport services.  Advised to call [facility] to arrange if desired.  Edwyna Shell, LCSW Clinical Social Worker Phone:  [phone]

## 2017-12-14 ENCOUNTER — Telehealth: Payer: Self-pay | Admitting: *Deleted

## 2017-12-14 DIAGNOSIS — Z510 Encounter for antineoplastic radiation therapy: Secondary | ICD-10-CM | POA: Diagnosis not present

## 2017-12-14 NOTE — Telephone Encounter (Signed)
Called patient to ask about chest x-ray and lab today @ [facility] , pt. Stated that he would go on 12-17-17 or 12-18-17 , spoke with Radiology @ [facility] and they are aware of this.

## 2017-12-14 NOTE — Telephone Encounter (Signed)
Called patient to ask about getting chest x-ray and lab, patient stated that he would go Monday

## 2017-12-17 ENCOUNTER — Ambulatory Visit
Admission: RE | Admit: 2017-12-17 | Discharge: 2017-12-17 | Disposition: A | Discharge status: Home / Self Care | Payer: Medicaid Other | Source: Ambulatory Visit | Attending: Radiation Oncology | Admitting: Radiation Oncology

## 2017-12-17 ENCOUNTER — Other Ambulatory Visit
Admission: RE | Admit: 2017-12-17 | Discharge: 2017-12-17 | Disposition: A | Discharge status: Home / Self Care | Payer: Medicaid Other | Source: Ambulatory Visit | Attending: Radiation Oncology | Admitting: Radiation Oncology

## 2017-12-17 ENCOUNTER — Encounter: Payer: Self-pay | Admitting: *Deleted

## 2017-12-17 DIAGNOSIS — Z510 Encounter for antineoplastic radiation therapy: Secondary | ICD-10-CM | POA: Diagnosis not present

## 2017-12-17 DIAGNOSIS — C320 Malignant neoplasm of glottis: Secondary | ICD-10-CM

## 2017-12-17 DIAGNOSIS — Z1329 Encounter for screening for other suspected endocrine disorder: Secondary | ICD-10-CM

## 2017-12-17 LAB — TSH: TSH: 1.098 u[IU]/mL (ref 0.350–4.500)

## 2017-12-17 MED ORDER — SONAFINE EX EMUL
1.0000 "application " | Freq: Once | CUTANEOUS | Status: AC
  Administered 2017-12-17: 1 via TOPICAL

## 2017-12-17 NOTE — Progress Notes (Signed)
Oncology Nurse Navigator Documentation  To provide support, encouragement and care continuity, met with Mr. Masso for his initial LINAC RT.    I reviewed the 2-step treatment process, reminded him of the registration/arrival procedure for subsequent treatments.  He completed tmt without incident.    We discussed his attendance at next Tuesday morning's MDC, I provided him an 0830 arrival to Radiation Waiting following lobby registration.  I provided him a [place] Guideline sheet noting appt date/time.  I escorted him to Radiation Nursing for PUT with Dr. Isidore Moos, reminded him to see Shirley, [place], to arrange chest X-ray and labs at [facility].  He voiced understanding.  Gayleen Orem, RN, BSN Head & Neck Oncology Nurse [place] at [place] [phone]

## 2017-12-17 NOTE — Progress Notes (Signed)

## 2017-12-18 ENCOUNTER — Telehealth: Payer: Self-pay

## 2017-12-18 ENCOUNTER — Ambulatory Visit
Admission: RE | Admit: 2017-12-18 | Discharge: 2017-12-18 | Disposition: A | Discharge status: Home / Self Care | Payer: Medicaid Other | Source: Ambulatory Visit | Attending: Radiation Oncology | Admitting: Radiation Oncology

## 2017-12-18 ENCOUNTER — Inpatient Hospital Stay: Payer: Medicaid Other | Attending: Radiation Oncology

## 2017-12-18 DIAGNOSIS — Z510 Encounter for antineoplastic radiation therapy: Secondary | ICD-10-CM | POA: Diagnosis not present

## 2017-12-18 NOTE — Telephone Encounter (Signed)
error 

## 2017-12-18 NOTE — Telephone Encounter (Signed)
I called [name] and informed him of normal results of his chest x ray and lab performed yesterday. He voiced his appreciation and knows to call me if he has any further questions.

## 2017-12-18 NOTE — Progress Notes (Signed)
Nutrition  Patient was scheduled for nutrition appointment today and did not show.  Noted patient scheduled for nutrition visit on Jan 22nd with [name].    [name], [place], [place] Registered Dietitian [phone] (pager)

## 2017-12-19 ENCOUNTER — Ambulatory Visit
Admission: RE | Admit: 2017-12-19 | Discharge: 2017-12-19 | Disposition: A | Discharge status: Home / Self Care | Payer: Medicaid Other | Source: Ambulatory Visit | Attending: Radiation Oncology | Admitting: Radiation Oncology

## 2017-12-19 DIAGNOSIS — Z510 Encounter for antineoplastic radiation therapy: Secondary | ICD-10-CM | POA: Diagnosis not present

## 2017-12-20 ENCOUNTER — Ambulatory Visit
Admission: RE | Admit: 2017-12-20 | Discharge: 2017-12-20 | Disposition: A | Discharge status: Home / Self Care | Payer: Medicaid Other | Source: Ambulatory Visit | Attending: Radiation Oncology | Admitting: Radiation Oncology

## 2017-12-20 ENCOUNTER — Encounter: Payer: Self-pay | Admitting: Nutrition

## 2017-12-20 DIAGNOSIS — Z510 Encounter for antineoplastic radiation therapy: Secondary | ICD-10-CM | POA: Diagnosis not present

## 2017-12-21 ENCOUNTER — Ambulatory Visit
Admission: RE | Admit: 2017-12-21 | Discharge: 2017-12-21 | Disposition: A | Discharge status: Home / Self Care | Payer: Medicaid Other | Source: Ambulatory Visit | Attending: Radiation Oncology | Admitting: Radiation Oncology

## 2017-12-21 DIAGNOSIS — Z510 Encounter for antineoplastic radiation therapy: Secondary | ICD-10-CM | POA: Diagnosis not present

## 2017-12-24 ENCOUNTER — Other Ambulatory Visit: Payer: Self-pay | Admitting: Radiation Oncology

## 2017-12-24 ENCOUNTER — Ambulatory Visit
Admission: RE | Admit: 2017-12-24 | Discharge: 2017-12-24 | Disposition: A | Discharge status: Home / Self Care | Payer: Medicaid Other | Source: Ambulatory Visit | Attending: Radiation Oncology | Admitting: Radiation Oncology

## 2017-12-24 DIAGNOSIS — C320 Malignant neoplasm of glottis: Secondary | ICD-10-CM

## 2017-12-24 DIAGNOSIS — Z510 Encounter for antineoplastic radiation therapy: Secondary | ICD-10-CM | POA: Diagnosis not present

## 2017-12-24 MED ORDER — LIDOCAINE VISCOUS 2 % MT SOLN
OROMUCOSAL | 4 refills | Status: DC
Start: 2017-12-24 — End: 2018-03-09

## 2017-12-24 MED ORDER — ACETAMINOPHEN 500 MG PO TABS: ORAL_TABLET | ORAL | 1 refills | Status: DC

## 2017-12-25 ENCOUNTER — Encounter: Payer: Self-pay | Admitting: *Deleted

## 2017-12-25 ENCOUNTER — Ambulatory Visit: Payer: Medicaid Other | Admitting: Physical Therapy

## 2017-12-25 ENCOUNTER — Encounter: Payer: Self-pay | Admitting: Radiation Oncology

## 2017-12-25 ENCOUNTER — Inpatient Hospital Stay: Payer: Medicaid Other | Admitting: Nutrition

## 2017-12-25 ENCOUNTER — Ambulatory Visit: Payer: Medicaid Other | Attending: Radiation Oncology

## 2017-12-25 ENCOUNTER — Other Ambulatory Visit: Payer: Self-pay

## 2017-12-25 ENCOUNTER — Encounter: Payer: Self-pay | Admitting: General Practice

## 2017-12-25 ENCOUNTER — Ambulatory Visit
Admission: RE | Admit: 2017-12-25 | Discharge: 2017-12-25 | Disposition: A | Discharge status: Home / Self Care | Payer: Medicaid Other | Source: Ambulatory Visit | Attending: Radiation Oncology | Admitting: Radiation Oncology

## 2017-12-25 VITALS — BP 117/85 | HR 61 | Temp 97.8°F | Wt 156.8 lb

## 2017-12-25 DIAGNOSIS — R2689 Other abnormalities of gait and mobility: Secondary | ICD-10-CM | POA: Diagnosis present

## 2017-12-25 DIAGNOSIS — C320 Malignant neoplasm of glottis: Secondary | ICD-10-CM | POA: Insufficient documentation

## 2017-12-25 DIAGNOSIS — M542 Cervicalgia: Secondary | ICD-10-CM

## 2017-12-25 DIAGNOSIS — R1312 Dysphagia, oropharyngeal phase: Secondary | ICD-10-CM | POA: Diagnosis not present

## 2017-12-25 DIAGNOSIS — R293 Abnormal posture: Secondary | ICD-10-CM

## 2017-12-25 DIAGNOSIS — Z510 Encounter for antineoplastic radiation therapy: Secondary | ICD-10-CM | POA: Diagnosis not present

## 2017-12-25 NOTE — Therapy (Signed)
[facility] [address] [place], [place], [address] Phone: [phone]   Fax:  [phone]  Speech Language Pathology Evaluation  Patient Details  Name: [name] MRN: [id number] Date of Birth: 09-27-1961 Referring Provider: Eppie Gibson, MD   Encounter Date: 12/25/2017  End of Session - 12/25/17 1646    Visit Number  1    Number of Visits  7    Date for SLP Re-Evaluation  06/24/18    Authorization Type  Medicaid    SLP Start Time  1000    SLP Stop Time   1035    SLP Time Calculation (min)  35 min    Activity Tolerance  Patient tolerated treatment well       Past Medical History:  Diagnosis Date  . Anxiety   . Bipolar affective ([place])    takes Lithium daily  . Broken fingers   . Coronary artery disease    a. s/p PCI to RCA at [facility]; b. s/p PCI to RCA '08; c. Cath 05/2012 showed known occluded LCx, RCA stent w/ mild restenosis, LAD no significant dz, EF 35%; d. Cardiac cath in July of 2014 showed no significant change.   . Depression   . GERD (gastroesophageal reflux disease)    takes Protonix daily  . Hepatitis C    "diagnosed in the past 2 wk" (10/22/2012)  . History of ETOH abuse    quit 2007  . Hyperlipidemia   . Hypertension    "pt. denies - no longer taking blood pressure medications"  . Myocardial infarction ([place]) 1996; 1997; 2000's   "total of 3" (10/22/2012)  . PAD (peripheral artery disease) ([place])    2010 PTA & stent to left CIA & left SFA; 05/2012 PTA & stent to distal LCIA; 10/2012 thrombectomy and stents to [place]  . Schizophrenia ([place])   . Seizures ([place])    "when I get anxious" (Stated last seizure three months ago)  . Tobacco abuse   . Urinary frequency     Past Surgical History:  Procedure Laterality Date  . ABDOMINAL AORTAGRAM N/A 10/22/2012   Procedure: ABDOMINAL Maxcine Ham;  Surgeon: Wellington Hampshire, MD;  Location: [facility] CATH LAB;  Service: Cardiovascular;   Laterality: N/A;  . ABDOMINAL AORTAGRAM N/A 11/11/2014   Procedure: ABDOMINAL Maxcine Ham;  Surgeon: Wellington Hampshire, MD;  Location: [facility] CATH LAB;  Service: Cardiovascular;  Laterality: N/A;  . ANTERIOR CERVICAL DECOMP/DISCECTOMY FUSION  01/2011   plate & screw placement   . ANTERIOR CERVICAL DECOMP/DISCECTOMY FUSION N/A 01/26/2017   Procedure: ANTERIOR CERVICAL DECOMPRESSION/DISCECTOMY FUSION CERVICAL FOUR- CERVICAL FIVE;  Surgeon: Ashok Pall, MD;  Location: [facility];  Service: Neurosurgery;  Laterality: N/A;  ANTERIOR CERVICAL DECOMPRESSION/DISCECTOMY FUSION CERVICAL FOUR- CERVICAL FIVE  . AORTA - BILATERAL FEMORAL ARTERY BYPASS GRAFT N/A 11/20/2014   Procedure: AORTA BIFEMORAL BYPASS GRAFT;  Surgeon: Mal Misty, MD;  Location: [facility];  Service: Vascular;  Laterality: N/A;  . BACK SURGERY    . BRAIN SURGERY  1989   subdural hematoma  . CARDIAC CATHETERIZATION     Stent - 12  . CHOLECYSTECTOMY N/A 06/29/2017   Procedure: LAPAROSCOPIC CHOLECYSTECTOMY WITH INTRAOPERATIVE CHOLANGIOGRAM, POSSIBLE OPEN;  Surgeon: Robert Bellow, MD;  Location: [facility] ORS;  Service: General;  Laterality: N/A;  . CORONARY ANGIOPLASTY WITH STENT PLACEMENT  03/2007   to RCA  . HERNIA REPAIR    . ILIAC ARTERY STENT  10/22/2012   "2" (10/22/2012)  . ILIAC  ARTERY STENT  06/2012   left  . INCISIONAL HERNIA REPAIR N/A 11/08/2015   Procedure: INCISIONAL HERNIA REPAIR WITH MYOFASCIAL RELEASE;  Surgeon: Rolm Bookbinder, MD;  Location: [facility];  Service: General;  Laterality: N/A;  . INSERTION OF MESH N/A 11/08/2015   Procedure: INSERTION OF MESH;  Surgeon: Rolm Bookbinder, MD;  Location: [facility];  Service: General;  Laterality: N/A;  . LEFT HEART CATHETERIZATION WITH CORONARY ANGIOGRAM N/A 05/29/2012   Procedure: LEFT HEART CATHETERIZATION WITH CORONARY ANGIOGRAM;  Surgeon: Wellington Hampshire, MD;  Location: [facility] CATH LAB;  Service: Cardiovascular;  Laterality: N/A;  . LIVER BIOPSY  10/2012  . LOWER EXTREMITY ANGIOGRAM N/A  05/29/2012   Procedure: LOWER EXTREMITY ANGIOGRAM;  Surgeon: Wellington Hampshire, MD;  Location: [facility] CATH LAB;  Service: Cardiovascular;  Laterality: N/A;  . MICROLARYNGOSCOPY WITH CO2 LASER AND EXCISION OF VOCAL CORD LESION N/A 11/21/2017   Procedure: MICROLARYNGOSCOPY WITH EXCISION OF VOCAL CORD LESION;  Surgeon: Jerrell Belfast, MD;  Location: [facility];  Service: ENT;  Laterality: N/A;  . PERCUTANEOUS STENT INTERVENTION Left 05/29/2012   Procedure: PERCUTANEOUS STENT INTERVENTION;  Surgeon: Wellington Hampshire, MD;  Location: [facility] CATH LAB;  Service: Cardiovascular;  Laterality: Left;  . POSTERIOR FUSION CERVICAL SPINE  2012  . SUBDURAL HEMATOMA EVACUATION VIA CRANIOTOMY      There were no vitals filed for this visit.  Subjective Assessment - 12/25/17 1016    Subjective  "I gotta chew it up real good." Pureed foods, liquids tend to pass through phayrnx well.         SLP Evaluation [facility] - 12/25/17 1009      SLP Visit Information   SLP Received On  12/25/17    Referring Provider  Eppie Gibson, MD    Onset Date  late summer 2018    Medical Diagnosis  bil vocal cord SSCA      Subjective   Patient/Family Stated Goal  Be able to swallow better      General Information   HPI  Pt is 57 YOM with bilateral vocal fold SCCA, hoarseness began June 2018. Pt is smoker/drinker and has not quit. He began 28 fractions of 63 Gy on 12-17-17. Pt is endentulous without dentures.      Oral Motor/Sensory Function   Overall Oral Motor/Sensory Function  Appears within functional limits for tasks assessed      Motor Speech   Phonation  Hoarse severe    Intelligibility  Intelligible    Phonation  --       Pt currently tolerates soft foods and thin liquids, and reports that liquids "are easy" (no coughing or difficulty with pharyngeal clearance). Pt has to chew solids well or eat more pureed-type textures in order for them for WFL/WNL pharyngeal clearance. POs: Pt is endentulous today, and without dentures at  home. Pt ate bites of [place] sandwich and drank water today. Oral stage was lengthened due to endentulous status and need to effectively mash bolus, however pt had two fairly large size bites without overt s/s aspiration. Thyroid elevation appeared adequate, and swallows appeared timely once oral stage was initiated. Pt's swallow deemed WFL for soft solids/thin liquids at this time.   Because data states the risk for dysphagia during and after radiation treatment is high due to undergoing radiation tx, SLP taught pt about the possibility of reduced/limited ability for PO intake during rad tx. SLP encouraged pt to ingest POs and/or complete HEP shortly after administration of pain meds.   SLP  educated pt re: changes to swallowing musculature after rad tx, and why adherence to dysphagia HEP provided today was needed to reduce muscle fibrosis following rad tx. Pt demonstrated understanding of these things to SLP.    SLP then developed a HEP for pt and pt was instructed how to perform exercises involving lingual, vocal, and pharyngeal strengthening. SLP performed each exercise and pt return demonstrated each exercise. SLP ensured pt performance was correct prior to moving on to next exercise. Pt was instructed to complete this program 2-3 times a day, until July 4th, then x2 a week after that.                 SLP Education - 12/25/17 1645    Education provided  Yes    Education Details  HEP for swallowing, late effects head/neck radiation on swallowing function    Person(s) Educated  Patient;Other (comment)    Methods  Explanation;Demonstration;Verbal cues;Handout    Comprehension  Verbalized understanding;Returned demonstration;Need further instruction;Verbal cues required       SLP Short Term Goals - 12/25/17 1650      SLP SHORT TERM GOAL #1   Title  pt will complete HEP with occasional mod A    Baseline  total A    Time  1    Period  -- visit    Status  New      SLP SHORT TERM  GOAL #2   Title  pt will tell SLP why he is completing HEP with modified independence    Baseline  total A    Time  1    Period  -- visit    Status  New      SLP SHORT TERM GOAL #3   Title  pt will tell SLP 3 overt s/s of aspiration PNA with modified independence    Baseline  not provided yet    Time  1    Period  -- visit    Status  New       SLP Long Term Goals - 12/25/17 1652      SLP LONG TERM GOAL #1   Title  pt will tell SLP 3 overt s/s of aspiration PNA with modified independence over two visits    Time  3    Period  -- visits    Status  New      SLP LONG TERM GOAL #2   Title  pt will complete HEP with occasional min A over two visits    Baseline  total A    Time  4    Period  -- visits    Status  New      SLP LONG TERM GOAL #3   Title  pt will tell SLP when HEP frequency can be reduced to x2-3/week    Time  5    Period  -- visits    Status  New       Plan - 12/25/17 1647    Clinical Impression Statement  Pt with oropharyngeal swallowing essentially [facility] for soft solids and thin liquids, however the probability of swallowing difficulty increases dramatically with the initiation of radiation therapy. Pt will need to be followed by SLP for regular assessment of accurate HEP completion as well as for safety with POs both during and following treatment/s.    Speech Therapy Frequency  -- once approx every 4 weeks     Duration  -- 6 visits    Treatment/Interventions  Aspiration precaution training;Pharyngeal strengthening  exercises;Diet toleration management by SLP;Trials of upgraded texture/liquids;Internal/external aids;Patient/family education;Compensatory strategies;SLP instruction and feedback    Potential to Achieve Goals  Good    SLP Home Exercise Plan  provided today    Consulted and Agree with Plan of Care  Patient       Patient will benefit from skilled therapeutic intervention in order to improve the following deficits and impairments:   Oropharyngeal  dysphagia    Problem List Patient Active Problem List   Diagnosis Date Noted  . Malignant neoplasm of glottis ([place]) 12/11/2017  . 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 Cholecystitis 06/29/2017  . Osteoarthritis of spine with radiculopathy, cervical region 01/26/2017  . Gallbladder polyp 06/01/2016  . Rib pain on right side 06/01/2016  . ETOH abuse 11/24/2014  . Bipolar disorder, currently in remission ([place]) 11/24/2014  . Alcohol dependence with withdrawal with complication ([place]) 70/78/6754  . Chronic hepatitis C ([place]) 09/12/2013  . Cardiomyopathy, ischemic 06/09/2013  . PAD (peripheral artery disease) ([place]) 05/23/2012  . HTN (hypertension) 10/09/2011  . CAROTID BRUIT 12/19/2010  . DYSPNEA 06/15/2010  . Hyperlipidemia 05/06/2010  . TOBACCO USER 05/06/2010  . CAD, NATIVE VESSEL 05/06/2010  . 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 [place], [place], [address] Phone: [phone]   Fax:  [phone]  Name: [name] MRN: [id number] Date of Birth: 16-Feb-1961

## 2017-12-25 NOTE — Progress Notes (Signed)
Oncology Nurse Navigator Documentation  Met with [name] during H&N [place].  He was accompanied by friend.  Provided verbal and written overview of [place], the clinicians who will be seeing him, encouraged him to ask questions during his time with them.  He was seen by Nutrition, SLP, PT, SW and [place].  Spoke with him at end of [facility], addressed questions.  He proceeded to RT s/p MDC. I encouraged him to call me with needs/concerns.  Gayleen Orem, RN, BSN, [place] at [place] [phone]

## 2017-12-25 NOTE — Progress Notes (Signed)
[place] Work H[facility]N [place]  Clinical Education officer, museum met with patient and patients friend in [facility] to offer support and assess for needs.  Patient has been trying to identify a new psychiatrist in the community.  Patient stated he had been seeing his last psychiatrist for the last 10 years, but the provider no longer accepts his insurance.  Patient has attempted to establish care with another provider, but was unhappy with the program and would like identify another provider.  CSW, patient, and patients friend discussed possible options.  Patient plans to meet with a member of the CSW team tomorrow to discuss additional referral options.  CSW and patient also discussed the importance of support during treatment.  Patient identifies his friend, Bobby Rumpf as strong support.   CSW provided patient with information on the support team and support services at [facility], and encouraged patient to call with questions or concerns.   Johnnye Lana, MSW, LCSW, OSW-C Clinical Social Worker [facility] [phone]

## 2017-12-25 NOTE — Progress Notes (Signed)
[facility] CSW Progress Note  Patient requests new referral for mental health medications.  Indicates that [facility] [facility] is requiring participation in group sessions which patient does not have time for in course of current cancer treatment plan.  CSW called [name] - the only provider that will make appointment for patient (rather than having walk in clinic hours) other than [facility] is [facility] in [place].  CSW spoke w patient, patient requested CSW refer him there.  Referral sent. Awaiting appointment time/date.  Edwyna Shell, LCSW Clinical Social Worker Phone:  [phone]

## 2017-12-25 NOTE — Patient Instructions (Signed)
SWALLOWING EXERCISES Do these until July 4th, then 2 times per week afterwards  1. Effortful Swallows - Press your tongue against the roof of your mouth for 3 seconds, then squeeze          the muscles in your neck while you swallow your saliva or a sip of water - Repeat 20 times, 2-3 times a day, and use whenever you eat or drink  2. Masako Swallow - swallow with your tongue sticking out - Stick tongue out past your teeth and gently bite tongue with your teeth - Swallow, while holding your tongue with your teeth - Repeat 20 times, 2-3 times a day *use a wet spoon if your mouth gets dry*  3. Pitch Raise - Repeat "he", once per second in as high of a pitch as you can - Repeat 20 times, 2-3 times a day  4. Mendelsohn Maneuver - "half swallow" exercise - Start to swallow, and keep your Adam's apple up by squeezing hard with the  muscles of the throat - Hold the squeeze for 5-7 seconds and then relax - Repeat 20 times, 2-3 times a day *use a wet spoon if your mouth gets dry*  5. Breath Hold - Say "HUH!" loudly, then hold your breath for 3 seconds at your voice box - Repeat 20 times, 2-3 times a day  6. Chin pushback - Open your mouth  - Place your fist UNDER your chin near your neck, and push back with your fist for 5 seconds - Repeat 10 times, 2-3 times a day        7. Siren exercise  - say "ah" from as low a note as you can go to as high of a note as you can - 10 times, 2 times a day  - say "ah" from as HIGH a note as you can to as LOW of a note as you can go - 10 times, 2 times a day

## 2017-12-25 NOTE — Progress Notes (Signed)
Patient was seen, during head and neck clinic.  57 year old male diagnosed with bilateral glottis cancer to receive 28 fractions radiation therapy which started January 14. He is a patient of Dr. Isidore Moos.  Past medical history includes tobacco and alcohol usage.  Medications include Lipitor, Wellbutrin, and Protonix.  Labs were reviewed.  Height: 5 feet 7 inches. Weight: 156.8 pounds. Usual body weight: 150-160 pounds per patient. BMI: 24.56.  Patient presents to clinic with his friend who provides transportation for him and does shopping and cooking for him. Patient is edentulous and does not have dentures. He tolerates soft food. Patient has been drinking chocolate boost. He currently does not have nutrition impact symptoms.  Nutrition diagnosis:  Food and nutrition related knowledge deficit related to glottis cancer as evidenced by no prior need for nutrition related information.  Intervention: I educated patient to consume small frequent meals and snacks with increased calories and protein with a goal of weight maintenance. Recommended patient begin chocolate boost one to 2 bottles daily. Provided samples and coupons. Questions were answered.  Teach back method used.  Contact information provided.  Monitoring, evaluation, goals: Patient will tolerate adequate calories and protein to achieve weight maintenance throughout treatment.  Next visit: To be scheduled weekly with treatments.  **Disclaimer: This note was dictated with voice recognition software. Similar sounding words can inadvertently be transcribed and this note may contain transcription errors which may not have been corrected upon publication of note.**

## 2017-12-25 NOTE — Therapy (Signed)
[place], [place], [address] Phone: [phone]   Fax:  [phone]  Physical Therapy Evaluation  Patient Details  Name: [name] MRN: [id number] Date of Birth: 03/30/1961 Referring Provider: Dr. Eppie Gibson   Encounter Date: 12/25/2017  PT End of Session - 12/25/17 1401    Visit Number  1    Number of Visits  1    PT Start Time  1038    PT Stop Time  1100    PT Time Calculation (min)  22 min    Activity Tolerance  Patient tolerated treatment well    Behavior During Therapy  [facility] for tasks assessed/performed       Past Medical History:  Diagnosis Date  . Anxiety   . Bipolar affective ([place])    takes Lithium daily  . Broken fingers   . Coronary artery disease    a. s/p PCI to RCA at [facility]; b. s/p PCI to RCA '08; c. Cath 05/2012 showed known occluded LCx, RCA stent w/ mild restenosis, LAD no significant dz, EF 35%; d. Cardiac cath in July of 2014 showed no significant change.   . Depression   . GERD (gastroesophageal reflux disease)    takes Protonix daily  . Hepatitis C    "diagnosed in the past 2 wk" (10/22/2012)  . History of ETOH abuse    quit 2007  . Hyperlipidemia   . Hypertension    "pt. denies - no longer taking blood pressure medications"  . Myocardial infarction ([place]) 1996; 1997; 2000's   "total of 3" (10/22/2012)  . PAD (peripheral artery disease) ([place])    2010 PTA & stent to left CIA & left SFA; 05/2012 PTA & stent to distal LCIA; 10/2012 thrombectomy and stents to [place]  . Schizophrenia ([place])   . Seizures ([place])    "when I get anxious" (Stated last seizure three months ago)  . Tobacco abuse   . Urinary frequency     Past Surgical History:  Procedure Laterality Date  . ABDOMINAL AORTAGRAM N/A 10/22/2012   Procedure: ABDOMINAL Maxcine Ham;  Surgeon: Wellington Hampshire, MD;  Location: [place] CATH LAB;  Service: Cardiovascular;  Laterality: N/A;  . ABDOMINAL  AORTAGRAM N/A 11/11/2014   Procedure: ABDOMINAL Maxcine Ham;  Surgeon: Wellington Hampshire, MD;  Location: [place] CATH LAB;  Service: Cardiovascular;  Laterality: N/A;  . ANTERIOR CERVICAL DECOMP/DISCECTOMY FUSION  01/2011   plate & screw placement   . ANTERIOR CERVICAL DECOMP/DISCECTOMY FUSION N/A 01/26/2017   Procedure: ANTERIOR CERVICAL DECOMPRESSION/DISCECTOMY FUSION CERVICAL FOUR- CERVICAL FIVE;  Surgeon: Ashok Pall, MD;  Location: [facility];  Service: Neurosurgery;  Laterality: N/A;  ANTERIOR CERVICAL DECOMPRESSION/DISCECTOMY FUSION CERVICAL FOUR- CERVICAL FIVE  . AORTA - BILATERAL FEMORAL ARTERY BYPASS GRAFT N/A 11/20/2014   Procedure: AORTA BIFEMORAL BYPASS GRAFT;  Surgeon: Mal Misty, MD;  Location: [place];  Service: Vascular;  Laterality: N/A;  . BACK SURGERY    . BRAIN SURGERY  1989   subdural hematoma  . CARDIAC CATHETERIZATION     Stent - 12  . CHOLECYSTECTOMY N/A 06/29/2017   Procedure: LAPAROSCOPIC CHOLECYSTECTOMY WITH INTRAOPERATIVE CHOLANGIOGRAM, POSSIBLE OPEN;  Surgeon: Robert Bellow, MD;  Location: [facility] ORS;  Service: General;  Laterality: N/A;  . CORONARY ANGIOPLASTY WITH STENT PLACEMENT  03/2007   to RCA  . HERNIA REPAIR    . ILIAC ARTERY STENT  10/22/2012   "2" (10/22/2012)  . ILIAC ARTERY STENT  06/2012   left  .  INCISIONAL HERNIA REPAIR N/A 11/08/2015   Procedure: INCISIONAL HERNIA REPAIR WITH MYOFASCIAL RELEASE;  Surgeon: Rolm Bookbinder, MD;  Location: [facility];  Service: General;  Laterality: N/A;  . INSERTION OF MESH N/A 11/08/2015   Procedure: INSERTION OF MESH;  Surgeon: Rolm Bookbinder, MD;  Location: [facility];  Service: General;  Laterality: N/A;  . LEFT HEART CATHETERIZATION WITH CORONARY ANGIOGRAM N/A 05/29/2012   Procedure: LEFT HEART CATHETERIZATION WITH CORONARY ANGIOGRAM;  Surgeon: Wellington Hampshire, MD;  Location: [place] CATH LAB;  Service: Cardiovascular;  Laterality: N/A;  . LIVER BIOPSY  10/2012  . LOWER EXTREMITY ANGIOGRAM N/A 05/29/2012   Procedure: LOWER  EXTREMITY ANGIOGRAM;  Surgeon: Wellington Hampshire, MD;  Location: [facility] CATH LAB;  Service: Cardiovascular;  Laterality: N/A;  . MICROLARYNGOSCOPY WITH CO2 LASER AND EXCISION OF VOCAL CORD LESION N/A 11/21/2017   Procedure: MICROLARYNGOSCOPY WITH EXCISION OF VOCAL CORD LESION;  Surgeon: Jerrell Belfast, MD;  Location: [facility];  Service: ENT;  Laterality: N/A;  . PERCUTANEOUS STENT INTERVENTION Left 05/29/2012   Procedure: PERCUTANEOUS STENT INTERVENTION;  Surgeon: Wellington Hampshire, MD;  Location: [facility] CATH LAB;  Service: Cardiovascular;  Laterality: Left;  . POSTERIOR FUSION CERVICAL SPINE  2012  . SUBDURAL HEMATOMA EVACUATION VIA CRANIOTOMY      There were no vitals filed for this visit.   Subjective Assessment - 12/25/17 1352    Subjective  "I've had two neck surgeries."    Patient is accompained by:  -- roommate, Louis    Pertinent History  Diagnosis is bilateral vocal cord squamous cell carcinomas.  Started XRT 12/17/17 and expects to have 63 Gray in 28 fractions to larynx.  Edentulous and without dentures. Smokes 1 ppd.  Reports two neck fusions, one last year and one ten years ago.    Patient Stated Goals  get info from all head & neck clinic providers    Currently in Pain?  Yes    Pain Score  4     Pain Location  Neck    Pain Orientation  Anterior;Posterior    Pain Descriptors / Indicators  Sore    Aggravating Factors   nothing    Pain Relieving Factors  nothing         [facility] PT Assessment - 12/25/17 0001      Assessment   Medical Diagnosis  bilat. vocal cord squamous cell carcinoma    Referring Provider  Dr. Eppie Gibson    Onset Date/Surgical Date  11/03/17 approx.    Hand Dominance  Right    Prior Therapy  none      Precautions   Precautions  Other (comment)    Precaution Comments  cancer precautions      Restrictions   Weight Bearing Restrictions  No      Balance Screen   Has the patient fallen in the past 6 months  No    Has the patient had a decrease in activity level  because of a fear of falling?   No    Is the patient reluctant to leave their home because of a fear of falling?   No      Home Environment   Living Environment  Private residence    Living Arrangements  Non-relatives/Friends    Type of [place]  One level      Prior Function   Level of Independence  Independent roommate drives him    Leisure  no regular exercise  Cognition   Overall Cognitive Status  Within Functional Limits for tasks assessed      Observation/Other Assessments   Observations  gentleman with no teeth who has fairly constant movement of his tongue inside his mouth against cheeks and lips      Coordination   Gross Motor Movements are Fluid and Coordinated  Yes      Functional Tests   Functional tests  Sit to Stand      Sit to Stand   Comments  9 times in 30 seconds, well below average for his age      Posture/Postural Control   Posture/Postural Control  Postural limitations    Postural Limitations  Forward head      ROM / Strength   AROM / PROM / Strength  AROM      AROM   Overall AROM Comments  neck A/ROM in sitting:  flexion 25% loss with pt. c/o pulling at posterior neck; extension 20% loss and with discomfort; sidebend 50% loss bilat.; rotation 15% loss bilat.; shoulder A/ROM [facility] bilat.      Ambulation/Gait   Ambulation/Gait  Yes    Ambulation/Gait Assistance  7: Independent by his report; no assistive devices        LYMPHEDEMA/ONCOLOGY QUESTIONNAIRE - 12/25/17 1359      Type   Cancer Type  supraglottic      Treatment   Active Radiation Treatment  Yes    Date  12/17/17    Body Site  larynx      Lymphedema Assessments   Lymphedema Assessments  Head and Neck      Head and Neck   4 cm superior to sternal notch around neck  42 cm    6 cm superior to sternal notch around neck  42.3 cm    8 cm superior to sternal notch around neck  43.1 cm          Objective measurements completed on examination: See above  findings.              PT Education - 12/25/17 1401    Education provided  Yes    Education Details  neck ROM, posture, breathing, walking, CURE article on staying active, "Why exercise?" flyer, lymphedema and PT info    Person(s) Educated  Patient;Other (comment) rommate    Methods  Explanation;Handout    Comprehension  Verbalized understanding              Head and Neck Clinic Goals - 12/25/17 1406      Patient will be able to verbalize understanding of a home exercise program for cervical range of motion, posture, and walking.    Status  Achieved      Patient will be able to verbalize understanding of proper sitting and standing posture.    Status  Achieved      Patient will be able to verbalize understanding of lymphedema risk and availability of treatment for this condition.    Status  Achieved         Plan - 12/25/17 1402    Clinical Impression Statement  Quiet gentleman with diagnosis of bilateral vocal cord squamous cell carcinoma.  He has started XRT to larynx.  He has limited neck ROM, having had two neck fusions; he has forward head posture and decreased reps on 30 second sit to stand compared to average for his age.    History and Personal Factors relevant to plan of care:  h/o two neck fusions with decreased  neck ROM    Clinical Presentation  Evolving    Clinical Presentation due to:  has just started XRT    Clinical Decision Making  Moderate    Rehab Potential  Good    PT Frequency  One time visit    PT Treatment/Interventions  Patient/family education    PT Next Visit Plan  no follow-up at this time; will need therapy if lymphedema develops    PT Home Exercise Plan  neck ROM, posture, breathing    Consulted and Agree with Plan of Care  Patient       Patient will benefit from skilled therapeutic intervention in order to improve the following deficits and impairments:  Decreased range of motion, Postural dysfunction, Decreased  mobility  Visit Diagnosis: Cervicalgia - Plan: PT plan of care cert/re-cert  Squamous cell carcinoma of vocal cord (HCC) - Plan: PT plan of care cert/re-cert  Other abnormalities of gait and mobility - Plan: PT plan of care cert/re-cert  Abnormal posture - Plan: PT plan of care cert/re-cert     Problem List Patient Active Problem List   Diagnosis Date Noted  . Malignant neoplasm of glottis ([place]) 12/11/2017  . 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 Cholecystitis 06/29/2017  . Osteoarthritis of spine with radiculopathy, cervical region 01/26/2017  . Gallbladder polyp 06/01/2016  . Rib pain on right side 06/01/2016  . ETOH abuse 11/24/2014  . Bipolar disorder, currently in remission ([place]) 11/24/2014  . Alcohol dependence with withdrawal with complication ([place]) 33/29/5188  . Chronic hepatitis C ([place]) 09/12/2013  . Cardiomyopathy, ischemic 06/09/2013  . PAD (peripheral artery disease) ([place]) 05/23/2012  . HTN (hypertension) 10/09/2011  . CAROTID BRUIT 12/19/2010  . DYSPNEA 06/15/2010  . Hyperlipidemia 05/06/2010  . TOBACCO USER 05/06/2010  . CAD, NATIVE VESSEL 05/06/2010  . 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

## 2017-12-25 NOTE — Progress Notes (Signed)
Financial Counselor--Spoke with patient today about applying for [place] grant--he will bring in his income verification tomorrow

## 2017-12-26 ENCOUNTER — Ambulatory Visit
Admission: RE | Admit: 2017-12-26 | Discharge: 2017-12-26 | Disposition: A | Discharge status: Home / Self Care | Payer: Medicaid Other | Source: Ambulatory Visit | Attending: Radiation Oncology | Admitting: Radiation Oncology

## 2017-12-26 DIAGNOSIS — Z510 Encounter for antineoplastic radiation therapy: Secondary | ICD-10-CM | POA: Diagnosis not present

## 2017-12-27 ENCOUNTER — Ambulatory Visit
Admission: RE | Admit: 2017-12-27 | Discharge: 2017-12-27 | Disposition: A | Discharge status: Home / Self Care | Payer: Medicaid Other | Source: Ambulatory Visit | Attending: Radiation Oncology | Admitting: Radiation Oncology

## 2017-12-27 DIAGNOSIS — Z510 Encounter for antineoplastic radiation therapy: Secondary | ICD-10-CM | POA: Diagnosis not present

## 2017-12-28 ENCOUNTER — Ambulatory Visit
Admission: RE | Admit: 2017-12-28 | Discharge: 2017-12-28 | Disposition: A | Discharge status: Home / Self Care | Payer: Medicaid Other | Source: Ambulatory Visit | Attending: Radiation Oncology | Admitting: Radiation Oncology

## 2017-12-28 DIAGNOSIS — Z510 Encounter for antineoplastic radiation therapy: Secondary | ICD-10-CM | POA: Diagnosis not present

## 2017-12-31 ENCOUNTER — Ambulatory Visit
Admission: RE | Admit: 2017-12-31 | Discharge: 2017-12-31 | Disposition: A | Discharge status: Home / Self Care | Payer: Medicaid Other | Source: Ambulatory Visit | Attending: Radiation Oncology | Admitting: Radiation Oncology

## 2017-12-31 ENCOUNTER — Other Ambulatory Visit: Payer: Self-pay | Admitting: Radiation Oncology

## 2017-12-31 ENCOUNTER — Telehealth: Payer: Self-pay

## 2017-12-31 DIAGNOSIS — C320 Malignant neoplasm of glottis: Secondary | ICD-10-CM

## 2017-12-31 DIAGNOSIS — Z510 Encounter for antineoplastic radiation therapy: Secondary | ICD-10-CM | POA: Diagnosis not present

## 2017-12-31 LAB — BASIC METABOLIC PANEL - CANCER CENTER ONLY
Anion gap: 8 (ref 3–11)
BUN: 16 mg/dL (ref 7–26)
CO2: 27 mmol/L (ref 22–29)
Calcium: 10.5 mg/dL — ABNORMAL HIGH (ref 8.4–10.4)
Chloride: 102 mmol/L (ref 98–109)
Creatinine: 1.16 mg/dL (ref 0.70–1.30)
GFR, Est AFR Am: >60 mL/min (ref >60)
GFR, Estimated: >60 mL/min (ref >60)
Glucose, Bld: 89 mg/dL (ref 70–140)
Potassium: 5.1 mmol/L (ref 3.5–5.1)
Sodium: 137 mmol/L (ref 136–145)

## 2017-12-31 MED ORDER — HYDROCODONE-ACETAMINOPHEN 7.5-325 MG/15ML PO SOLN: 10.0000 mL | ORAL | 0 refills | Status: DC | PRN

## 2017-12-31 MED ORDER — SENNA 8.6 MG PO TABS: 1.0000 | ORAL_TABLET | Freq: Every evening | ORAL | 1 refills | Status: DC | PRN

## 2017-12-31 NOTE — Telephone Encounter (Signed)
I left a voice mail with [name] informing him that the labs he had drawn today show that he is slightly dehydrated. Dr. Isidore Moos would like for him to get at least 6-8 cups of water/juice/milk or other decaffeinated liquid each day. This would equal 3-4 sixteen ounce bottles daily. I left my direct number for him to call if he had any further questions or concerns.

## 2018-01-01 ENCOUNTER — Ambulatory Visit
Admission: RE | Admit: 2018-01-01 | Discharge: 2018-01-01 | Disposition: A | Discharge status: Home / Self Care | Payer: Medicaid Other | Source: Ambulatory Visit | Attending: Radiation Oncology | Admitting: Radiation Oncology

## 2018-01-01 ENCOUNTER — Encounter: Payer: Self-pay | Admitting: Nutrition

## 2018-01-01 ENCOUNTER — Inpatient Hospital Stay: Payer: Medicaid Other | Admitting: Nutrition

## 2018-01-01 DIAGNOSIS — Z510 Encounter for antineoplastic radiation therapy: Secondary | ICD-10-CM | POA: Diagnosis not present

## 2018-01-01 NOTE — Progress Notes (Signed)
Patient did not show up for nutrition appointment. 

## 2018-01-02 ENCOUNTER — Ambulatory Visit
Admission: RE | Admit: 2018-01-02 | Discharge: 2018-01-02 | Disposition: A | Discharge status: Home / Self Care | Payer: Medicaid Other | Source: Ambulatory Visit | Attending: Radiation Oncology | Admitting: Radiation Oncology

## 2018-01-02 DIAGNOSIS — Z510 Encounter for antineoplastic radiation therapy: Secondary | ICD-10-CM | POA: Diagnosis not present

## 2018-01-03 ENCOUNTER — Encounter: Payer: Self-pay | Admitting: General Practice

## 2018-01-03 ENCOUNTER — Other Ambulatory Visit: Payer: Self-pay | Admitting: Radiation Oncology

## 2018-01-03 ENCOUNTER — Ambulatory Visit
Admission: RE | Admit: 2018-01-03 | Discharge: 2018-01-03 | Disposition: A | Discharge status: Home / Self Care | Payer: Medicaid Other | Source: Ambulatory Visit | Attending: Radiation Oncology | Admitting: Radiation Oncology

## 2018-01-03 DIAGNOSIS — Z510 Encounter for antineoplastic radiation therapy: Secondary | ICD-10-CM | POA: Diagnosis not present

## 2018-01-03 DIAGNOSIS — C320 Malignant neoplasm of glottis: Secondary | ICD-10-CM

## 2018-01-03 MED ORDER — PROCHLORPERAZINE MALEATE 10 MG PO TABS: 10.0000 mg | ORAL_TABLET | Freq: Four times a day (QID) | ORAL | 0 refills | Status: DC | PRN

## 2018-01-03 NOTE — Progress Notes (Signed)
[place] CSW Progress Note  Initial appointment scheduled for medications management for mental health medications on Wednesday, January 16, 2018 at 10am at [facility], [address], [place]  [phone].  This was the first available AM appointment as patient had requested this time.    Edwyna Shell, LCSW Clinical Social Worker Phone:  [phone]

## 2018-01-04 ENCOUNTER — Ambulatory Visit
Admission: RE | Admit: 2018-01-04 | Discharge: 2018-01-04 | Disposition: A | Discharge status: Home / Self Care | Payer: Medicaid Other | Source: Ambulatory Visit | Attending: Radiation Oncology | Admitting: Radiation Oncology

## 2018-01-04 DIAGNOSIS — Z510 Encounter for antineoplastic radiation therapy: Secondary | ICD-10-CM | POA: Diagnosis not present

## 2018-01-07 ENCOUNTER — Ambulatory Visit
Admission: RE | Admit: 2018-01-07 | Discharge: 2018-01-07 | Disposition: A | Discharge status: Home / Self Care | Payer: Medicaid Other | Source: Ambulatory Visit | Attending: Radiation Oncology | Admitting: Radiation Oncology

## 2018-01-07 ENCOUNTER — Other Ambulatory Visit: Payer: Self-pay | Admitting: Radiation Oncology

## 2018-01-07 ENCOUNTER — Encounter: Payer: Self-pay | Admitting: *Deleted

## 2018-01-07 DIAGNOSIS — Z510 Encounter for antineoplastic radiation therapy: Secondary | ICD-10-CM | POA: Diagnosis not present

## 2018-01-07 DIAGNOSIS — C320 Malignant neoplasm of glottis: Secondary | ICD-10-CM

## 2018-01-07 MED ORDER — FENTANYL 25 MCG/HR TD PT72: 25.0000 ug | MEDICATED_PATCH | TRANSDERMAL | 0 refills | Status: DC

## 2018-01-07 NOTE — Progress Notes (Signed)
On 01-07-18 fax medical records to [facility] , it was consult note, sim & planning note

## 2018-01-08 ENCOUNTER — Ambulatory Visit
Admission: RE | Admit: 2018-01-08 | Discharge: 2018-01-08 | Disposition: A | Discharge status: Home / Self Care | Payer: Medicaid Other | Source: Ambulatory Visit | Attending: Radiation Oncology | Admitting: Radiation Oncology

## 2018-01-08 DIAGNOSIS — Z510 Encounter for antineoplastic radiation therapy: Secondary | ICD-10-CM | POA: Diagnosis not present

## 2018-01-09 ENCOUNTER — Other Ambulatory Visit: Payer: Self-pay | Admitting: Radiation Oncology

## 2018-01-09 ENCOUNTER — Ambulatory Visit
Admission: RE | Admit: 2018-01-09 | Discharge: 2018-01-09 | Disposition: A | Discharge status: Home / Self Care | Payer: Medicaid Other | Source: Ambulatory Visit | Attending: Radiation Oncology | Admitting: Radiation Oncology

## 2018-01-09 DIAGNOSIS — C320 Malignant neoplasm of glottis: Secondary | ICD-10-CM

## 2018-01-09 DIAGNOSIS — Z510 Encounter for antineoplastic radiation therapy: Secondary | ICD-10-CM | POA: Diagnosis not present

## 2018-01-09 MED ORDER — FENTANYL 37.5 MCG/HR TD PT72: 37.5000 ug | MEDICATED_PATCH | TRANSDERMAL | 0 refills | Status: DC

## 2018-01-10 ENCOUNTER — Ambulatory Visit
Admission: RE | Admit: 2018-01-10 | Discharge: 2018-01-10 | Disposition: A | Discharge status: Home / Self Care | Payer: Medicaid Other | Source: Ambulatory Visit | Attending: Radiation Oncology | Admitting: Radiation Oncology

## 2018-01-10 ENCOUNTER — Inpatient Hospital Stay: Payer: Medicaid Other | Attending: Radiation Oncology | Admitting: Nutrition

## 2018-01-10 DIAGNOSIS — Z510 Encounter for antineoplastic radiation therapy: Secondary | ICD-10-CM | POA: Diagnosis not present

## 2018-01-10 NOTE — Progress Notes (Signed)
Nutrition Note  Patient was scheduled for nutrition appointment today and did not show  Parks Ranger, MS, RDN, LDN 01/10/2018 5:54 PM

## 2018-01-11 ENCOUNTER — Ambulatory Visit
Admission: RE | Admit: 2018-01-11 | Discharge: 2018-01-11 | Disposition: A | Discharge status: Home / Self Care | Payer: Medicaid Other | Source: Ambulatory Visit | Attending: Radiation Oncology | Admitting: Radiation Oncology

## 2018-01-11 DIAGNOSIS — Z510 Encounter for antineoplastic radiation therapy: Secondary | ICD-10-CM | POA: Diagnosis not present

## 2018-01-14 ENCOUNTER — Ambulatory Visit: Payer: Medicaid Other

## 2018-01-15 ENCOUNTER — Ambulatory Visit
Admission: RE | Admit: 2018-01-15 | Discharge: 2018-01-15 | Disposition: A | Discharge status: Home / Self Care | Payer: Medicaid Other | Source: Ambulatory Visit | Attending: Radiation Oncology | Admitting: Radiation Oncology

## 2018-01-15 ENCOUNTER — Inpatient Hospital Stay: Payer: Medicaid Other | Admitting: Nutrition

## 2018-01-15 ENCOUNTER — Other Ambulatory Visit: Payer: Self-pay | Admitting: Radiation Oncology

## 2018-01-15 ENCOUNTER — Ambulatory Visit: Payer: Medicaid Other

## 2018-01-15 DIAGNOSIS — Z510 Encounter for antineoplastic radiation therapy: Secondary | ICD-10-CM | POA: Diagnosis not present

## 2018-01-15 DIAGNOSIS — C320 Malignant neoplasm of glottis: Secondary | ICD-10-CM

## 2018-01-15 MED ORDER — BISACODYL 5 MG PO TBEC
DELAYED_RELEASE_TABLET | ORAL | 0 refills | Status: DC
Start: 2018-01-15 — End: 2018-03-09

## 2018-01-15 MED ORDER — GABAPENTIN 300 MG PO CAPS: 300.0000 mg | ORAL_CAPSULE | Freq: Three times a day (TID) | ORAL | 0 refills | Status: DC

## 2018-01-16 ENCOUNTER — Ambulatory Visit
Admission: RE | Admit: 2018-01-16 | Discharge: 2018-01-16 | Disposition: A | Discharge status: Home / Self Care | Payer: Medicaid Other | Source: Ambulatory Visit | Attending: Radiation Oncology | Admitting: Radiation Oncology

## 2018-01-16 DIAGNOSIS — Z510 Encounter for antineoplastic radiation therapy: Secondary | ICD-10-CM | POA: Diagnosis not present

## 2018-01-17 ENCOUNTER — Ambulatory Visit
Admission: RE | Admit: 2018-01-17 | Discharge: 2018-01-17 | Disposition: A | Discharge status: Home / Self Care | Payer: Medicaid Other | Source: Ambulatory Visit | Attending: Radiation Oncology | Admitting: Radiation Oncology

## 2018-01-17 DIAGNOSIS — Z510 Encounter for antineoplastic radiation therapy: Secondary | ICD-10-CM | POA: Diagnosis not present

## 2018-01-18 ENCOUNTER — Ambulatory Visit
Admission: RE | Admit: 2018-01-18 | Discharge: 2018-01-18 | Disposition: A | Discharge status: Home / Self Care | Payer: Medicaid Other | Source: Ambulatory Visit | Attending: Radiation Oncology | Admitting: Radiation Oncology

## 2018-01-18 ENCOUNTER — Other Ambulatory Visit: Payer: Self-pay | Admitting: Urology

## 2018-01-18 DIAGNOSIS — C320 Malignant neoplasm of glottis: Secondary | ICD-10-CM

## 2018-01-18 DIAGNOSIS — Z510 Encounter for antineoplastic radiation therapy: Secondary | ICD-10-CM | POA: Diagnosis not present

## 2018-01-18 MED ORDER — OXYCODONE-ACETAMINOPHEN 5-325 MG PO TABS: 1.0000 | ORAL_TABLET | Freq: Four times a day (QID) | ORAL | 0 refills | Status: DC | PRN

## 2018-01-18 NOTE — Progress Notes (Signed)
Patient in the clinic today following his regularly scheduled treatment.  He continues with significant pain with swallowing, unrelieved with Magic mouthwash.  On exam, there is erythema within the treatment field but no skin breakdown or desquamation.  There is no evidence of thrush on exam but there is some injection in the posterior pharynx consistent with his treatment.  Most recently, he tried a trial of Neurontin which has not provided any benefit either.  Previously, he tried using fentanyl patches which did not provide relief.  Hydrocodone causes nausea and vomiting.  He has used Percocet after previous surgeries and this has seemed to work well for him and he tolerates this well.  He is willing to try the pill form of Percocet for pain relief.  I have  provided a prescription sent to his pharmacy today.  He has a scheduled visit with Dr. Isidore Moos on Monday, 01/21/2018 and will report his progress to her at that time.  Further recommendations will be based on his response.   Nicholos Johns, PA-C

## 2018-01-21 ENCOUNTER — Ambulatory Visit: Payer: Medicaid Other

## 2018-01-21 ENCOUNTER — Ambulatory Visit
Admission: RE | Admit: 2018-01-21 | Discharge: 2018-01-21 | Disposition: A | Discharge status: Home / Self Care | Payer: Medicaid Other | Source: Ambulatory Visit | Attending: Radiation Oncology | Admitting: Radiation Oncology

## 2018-01-21 ENCOUNTER — Ambulatory Visit: Payer: Medicaid Other | Attending: Radiation Oncology

## 2018-01-21 ENCOUNTER — Other Ambulatory Visit: Payer: Self-pay | Admitting: Radiation Oncology

## 2018-01-21 DIAGNOSIS — Z510 Encounter for antineoplastic radiation therapy: Secondary | ICD-10-CM | POA: Diagnosis not present

## 2018-01-21 DIAGNOSIS — R1312 Dysphagia, oropharyngeal phase: Secondary | ICD-10-CM | POA: Diagnosis present

## 2018-01-21 DIAGNOSIS — C320 Malignant neoplasm of glottis: Secondary | ICD-10-CM

## 2018-01-21 MED ORDER — GUAIFENESIN-CODEINE 100-10 MG/5ML PO SYRP: ORAL_SOLUTION | ORAL | 0 refills | Status: DC

## 2018-01-21 NOTE — Therapy (Signed)
[facility] [address] [place], [place], [address] Phone: [phone]   Fax:  [phone]  Speech Language Pathology Treatment  Patient Details  Name: [name] MRN: [id number] Date of Birth: 1961-01-23 Referring Provider: Eppie Gibson, MD   Encounter Date: 01/21/2018  End of Session - 01/21/18 1522    Visit Number  2    Number of Visits  7    Date for SLP Re-Evaluation  06/24/18    Authorization Type  Medicaid    Authorization Time Period  01-21-18 to 02-17-18    Authorization - Visit Number  1    Authorization - Number of Visits  1    SLP Start Time  1245    SLP Stop Time   1522    SLP Time Calculation (min)  31 min    Activity Tolerance  Patient tolerated treatment well       Past Medical History:  Diagnosis Date  . Anxiety   . Bipolar affective ([place])    takes Lithium daily  . Broken fingers   . Coronary artery disease    a. s/p PCI to RCA at [facility]; b. s/p PCI to RCA '08; c. Cath 05/2012 showed known occluded LCx, RCA stent w/ mild restenosis, LAD no significant dz, EF 35%; d. Cardiac cath in July of 2014 showed no significant change.   . Depression   . GERD (gastroesophageal reflux disease)    takes Protonix daily  . Hepatitis C    "diagnosed in the past 2 wk" (10/22/2012)  . History of ETOH abuse    quit 2007  . Hyperlipidemia   . Hypertension    "pt. denies - no longer taking blood pressure medications"  . Myocardial infarction ([place]) 1996; 1997; 2000's   "total of 3" (10/22/2012)  . PAD (peripheral artery disease) ([place])    2010 PTA & stent to left CIA & left SFA; 05/2012 PTA & stent to distal LCIA; 10/2012 thrombectomy and stents to [place]  . Schizophrenia ([place])   . Seizures ([place])    "when I get anxious" (Stated last seizure three months ago)  . Tobacco abuse   . Urinary frequency     Past Surgical History:  Procedure Laterality Date  . ABDOMINAL AORTAGRAM N/A 10/22/2012    Procedure: ABDOMINAL Maxcine Ham;  Surgeon: Wellington Hampshire, MD;  Location: [facility] CATH LAB;  Service: Cardiovascular;  Laterality: N/A;  . ABDOMINAL AORTAGRAM N/A 11/11/2014   Procedure: ABDOMINAL Maxcine Ham;  Surgeon: Wellington Hampshire, MD;  Location: [facility] CATH LAB;  Service: Cardiovascular;  Laterality: N/A;  . ANTERIOR CERVICAL DECOMP/DISCECTOMY FUSION  01/2011   plate & screw placement   . ANTERIOR CERVICAL DECOMP/DISCECTOMY FUSION N/A 01/26/2017   Procedure: ANTERIOR CERVICAL DECOMPRESSION/DISCECTOMY FUSION CERVICAL FOUR- CERVICAL FIVE;  Surgeon: Ashok Pall, MD;  Location: [facility];  Service: Neurosurgery;  Laterality: N/A;  ANTERIOR CERVICAL DECOMPRESSION/DISCECTOMY FUSION CERVICAL FOUR- CERVICAL FIVE  . AORTA - BILATERAL FEMORAL ARTERY BYPASS GRAFT N/A 11/20/2014   Procedure: AORTA BIFEMORAL BYPASS GRAFT;  Surgeon: Mal Misty, MD;  Location: [facility];  Service: Vascular;  Laterality: N/A;  . BACK SURGERY    . BRAIN SURGERY  1989   subdural hematoma  . CARDIAC CATHETERIZATION     Stent - 12  . CHOLECYSTECTOMY N/A 06/29/2017   Procedure: LAPAROSCOPIC CHOLECYSTECTOMY WITH INTRAOPERATIVE CHOLANGIOGRAM, POSSIBLE OPEN;  Surgeon: Robert Bellow, MD;  Location: [facility] ORS;  Service: General;  Laterality: N/A;  . CORONARY ANGIOPLASTY  WITH STENT PLACEMENT  03/2007   to RCA  . HERNIA REPAIR    . ILIAC ARTERY STENT  10/22/2012   "2" (10/22/2012)  . ILIAC ARTERY STENT  06/2012   left  . INCISIONAL HERNIA REPAIR N/A 11/08/2015   Procedure: INCISIONAL HERNIA REPAIR WITH MYOFASCIAL RELEASE;  Surgeon: Rolm Bookbinder, MD;  Location: [facility] [facility];  Service: General;  Laterality: N/A;  . INSERTION OF MESH N/A 11/08/2015   Procedure: INSERTION OF MESH;  Surgeon: Rolm Bookbinder, MD;  Location: [place] [facility];  Service: General;  Laterality: N/A;  . LEFT HEART CATHETERIZATION WITH CORONARY ANGIOGRAM N/A 05/29/2012   Procedure: LEFT HEART CATHETERIZATION WITH CORONARY ANGIOGRAM;  Surgeon: Wellington Hampshire, MD;  Location: [facility] CATH  LAB;  Service: Cardiovascular;  Laterality: N/A;  . LIVER BIOPSY  10/2012  . LOWER EXTREMITY ANGIOGRAM N/A 05/29/2012   Procedure: LOWER EXTREMITY ANGIOGRAM;  Surgeon: Wellington Hampshire, MD;  Location: [facility] [place] CATH LAB;  Service: Cardiovascular;  Laterality: N/A;  . MICROLARYNGOSCOPY WITH CO2 LASER AND EXCISION OF VOCAL CORD LESION N/A 11/21/2017   Procedure: MICROLARYNGOSCOPY WITH EXCISION OF VOCAL CORD LESION;  Surgeon: Jerrell Belfast, MD;  Location: [place] [place];  Service: ENT;  Laterality: N/A;  . PERCUTANEOUS STENT INTERVENTION Left 05/29/2012   Procedure: PERCUTANEOUS STENT INTERVENTION;  Surgeon: Wellington Hampshire, MD;  Location: [facility] CATH LAB;  Service: Cardiovascular;  Laterality: Left;  . POSTERIOR FUSION CERVICAL SPINE  2012  . SUBDURAL HEMATOMA EVACUATION VIA CRANIOTOMY      There were no vitals filed for this visit.  Subjective Assessment - 01/21/18 1456    Subjective  Pt reports eating soft foods. Mod hoarse today.     Currently in Pain?  Yes    Pain Score  6     Pain Location  Throat    Pain Orientation  Mid    Pain Descriptors / Indicators  Sore    Pain Type  Acute pain    Pain Onset  1 to 4 weeks ago    Pain Frequency  Constant    Aggravating Factors   nothing    Pain Relieving Factors  nothing            ADULT SLP TREATMENT - 01/21/18 1457      General Information   Behavior/Cognition  Alert;Cooperative;Pleasant mood      Treatment Provided   Treatment provided  Dysphagia      Dysphagia Treatment   Temperature Spikes Noted  No    Respiratory Status  Room air    Oral Cavity - Dentition  Edentulous    Treatment Methods  Skilled observation;Therapeutic exercise;Patient/caregiver education    Patient observed directly with PO's  Yes    Type of PO's observed  Dysphagia 3 (soft);Thin liquids    Feeding  Able to feed self    Liquids provided via  Cup    Oral Phase Signs & Symptoms  -- none    Pharyngeal Phase Signs & Symptoms  -- none    Other treatment/comments  "Just  (coughing up) thick spit." Pt denies s/s aspiration PNA. Pt admits to noncompliance with HEP - no completion. SLP told pt why he was completing, as pt req'd total A for this. By session end, pt told SLP with modified independence - goal met. Pt had POs without any overt s/s aspiration. Hydrophonic voice throughout session likely due to edema and thickened phlegm/secretions.       Assessment / Recommendations / Plan   Plan  Continue with current  plan of care      Progression Toward Goals   Progression toward goals  Progressing toward goals       SLP Education - 01/21/18 1522    Education provided  Yes    Education Details  HEP procedure, late effects head/neck radiation on swallow function    Person(s) Educated  Patient    Methods  Explanation;Demonstration;Verbal cues;Handout    Comprehension  Verbal cues required;Returned demonstration;Verbalized understanding       SLP Short Term Goals - 01/21/18 1525      SLP SHORT TERM GOAL #1   Title  pt will complete HEP with occasional mod A    Baseline  total A    Time  1    Period  -- visit    Status  Not Met and ongoing      SLP SHORT TERM GOAL #2   Title  pt will tell SLP why he is completing HEP with modified independence    Baseline  total A    Status  Achieved      SLP SHORT TERM GOAL #3   Title  pt will tell SLP 3 overt s/s of aspiration PNA with modified independence    Baseline  not provided yet    Time  1    Period  -- visit    Status  Deferred and to continue; not addressed by SLP today       SLP Long Term Goals - 01/21/18 1529      SLP LONG TERM GOAL #1   Title  pt will tell SLP 3 overt s/s of aspiration PNA with modified independence over two visits    Time  2    Period  -- visits    Status  On-going      SLP LONG TERM GOAL #2   Title  pt will complete HEP with occasional min A over two visits    Baseline  total A    Time  3    Period  -- visits    Status  On-going      SLP LONG TERM GOAL #3   Title  pt  will tell SLP when HEP frequency can be reduced to x2-3/week    Time  4    Period  -- visits    Status  On-going       Plan - 01/21/18 1523    Clinical Impression Statement  Pt with oropharyngeal swallowing essentially [facility] for soft solids and thin liquids, however pt with hydrophonic voice today with mod hoarseness (improved from eval). Pt admitted to noncompliance with HEP but after initial coaching (needed consistently) today pt eventually completed exercises with independence. The probability of swallowing difficulty increases dramatically with the initiation of radiation therapy. Pt will need to continue to be followed by SLP for regular assessment of accurate HEP completion as well as for safety with POs both during and following radiation treatment/s.    Speech Therapy Frequency  -- once approx every 4 weeks     Duration  -- 6 visits    Treatment/Interventions  Aspiration precaution training;Pharyngeal strengthening exercises;Diet toleration management by SLP;Trials of upgraded texture/liquids;Internal/external aids;Patient/family education;Compensatory strategies;SLP instruction and feedback    Potential to Achieve Goals  Good    SLP Home Exercise Plan  provided today    Consulted and Agree with Plan of Care  Patient       Patient will benefit from skilled therapeutic intervention in order to improve the following deficits  and impairments:   Oropharyngeal dysphagia    Problem List Patient Active Problem List   Diagnosis Date Noted  . Malignant neoplasm of glottis ([place]) 12/11/2017  . 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 Cholecystitis 06/29/2017  . Osteoarthritis of spine with radiculopathy, cervical region 01/26/2017  . Gallbladder polyp 06/01/2016  . Rib pain on right side 06/01/2016  . ETOH abuse 11/24/2014  . Bipolar disorder, currently in remission ([place]) 11/24/2014  . Alcohol dependence with withdrawal with complication ([place]) 24/79/9800  . Chronic hepatitis C ([place]) 09/12/2013   . Cardiomyopathy, ischemic 06/09/2013  . PAD (peripheral artery disease) ([place]) 05/23/2012  . HTN (hypertension) 10/09/2011  . CAROTID BRUIT 12/19/2010  . DYSPNEA 06/15/2010  . Hyperlipidemia 05/06/2010  . TOBACCO USER 05/06/2010  . CAD, NATIVE VESSEL 05/06/2010  . 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

## 2018-01-22 ENCOUNTER — Ambulatory Visit
Admission: RE | Admit: 2018-01-22 | Discharge: 2018-01-22 | Disposition: A | Discharge status: Home / Self Care | Payer: Medicaid Other | Source: Ambulatory Visit | Attending: Radiation Oncology | Admitting: Radiation Oncology

## 2018-01-22 ENCOUNTER — Inpatient Hospital Stay: Payer: Medicaid Other | Admitting: Nutrition

## 2018-01-22 DIAGNOSIS — Z510 Encounter for antineoplastic radiation therapy: Secondary | ICD-10-CM | POA: Diagnosis not present

## 2018-01-22 NOTE — Progress Notes (Signed)
Nutrition Follow-up:  Patient with bilateral glottis cancer receiving radiation therapy.  Treatment to be completed on 2/21.    Met with patient in clinic today.  Patient reports he can't tell me what he eats because he can't remember.  Has not eaten anything today so far. Reports he usually only eats about 1 meal per day.  Has issues with nausea.  Spitting thick saliva in cup during interview.  Reports no taste and sore throat.  Reports that he drinks boost shakes sometimes but not every day.  Eats soup as well.  Can't tolerate any spicy foods which he likes.    Medications: reviewed  Labs: reviewed  Anthropometrics:   Weight taken today in RD clinic and 146 lb decreased from 156. 8 lb on 1/22.    6% weight loss in the last month, significant   NUTRITION DIAGNOSIS: Food and nutrition related knowledge deficit continues   MALNUTRITION DIAGNOSIS: Patient meets criteria for severe malnutrition in context of acute illness as evidenced by 6% weight loss in the last month and eating < or equal to 50% of energy needs for > or equal to 5 days.    INTERVENTION:   Discussed high calorie, high protein foods to add to diet to prevent further weight loss.  Soft Moist Protein foods handout given.   Encouraged patient to drink boost/ensure 2-3 times per day. Samples and coupons given.       MONITORING, EVALUATION, GOAL: Patient will tolerate adequate calories and protein to maintain weight   NEXT VISIT: Tuesday, March 5th following MD appt  Analissa Bayless B. Zenia Resides, [place], [place] Registered Dietitian [phone] (pager)

## 2018-01-23 ENCOUNTER — Ambulatory Visit
Admission: RE | Admit: 2018-01-23 | Discharge: 2018-01-23 | Disposition: A | Discharge status: Home / Self Care | Payer: Medicaid Other | Source: Ambulatory Visit | Attending: Radiation Oncology | Admitting: Radiation Oncology

## 2018-01-23 ENCOUNTER — Ambulatory Visit: Payer: Medicaid Other

## 2018-01-23 DIAGNOSIS — C320 Malignant neoplasm of glottis: Secondary | ICD-10-CM

## 2018-01-23 DIAGNOSIS — Z510 Encounter for antineoplastic radiation therapy: Secondary | ICD-10-CM | POA: Diagnosis not present

## 2018-01-23 LAB — BASIC METABOLIC PANEL - CANCER CENTER ONLY
Anion gap: 9 (ref 3–11)
BUN: 7 mg/dL (ref 7–26)
CO2: 29 mmol/L (ref 22–29)
Calcium: 10 mg/dL (ref 8.4–10.4)
Chloride: 103 mmol/L (ref 98–109)
Creatinine: 0.9 mg/dL (ref 0.70–1.30)
GFR, Est AFR Am: >60 mL/min (ref >60)
GFR, Estimated: >60 mL/min (ref >60)
Glucose, Bld: 98 mg/dL (ref 70–140)
Potassium: 3.9 mmol/L (ref 3.5–5.1)
Sodium: 141 mmol/L (ref 136–145)

## 2018-01-24 ENCOUNTER — Encounter: Payer: Self-pay | Admitting: *Deleted

## 2018-01-24 ENCOUNTER — Other Ambulatory Visit: Payer: Self-pay | Admitting: Radiation Oncology

## 2018-01-24 ENCOUNTER — Ambulatory Visit
Admission: RE | Admit: 2018-01-24 | Discharge: 2018-01-24 | Disposition: A | Discharge status: Home / Self Care | Payer: Medicaid Other | Source: Ambulatory Visit | Attending: Radiation Oncology | Admitting: Radiation Oncology

## 2018-01-24 DIAGNOSIS — C320 Malignant neoplasm of glottis: Secondary | ICD-10-CM

## 2018-01-24 DIAGNOSIS — Z510 Encounter for antineoplastic radiation therapy: Secondary | ICD-10-CM | POA: Diagnosis not present

## 2018-01-24 MED ORDER — GUAIFENESIN-CODEINE 100-10 MG/5ML PO SYRP: ORAL_SOLUTION | ORAL | 0 refills | Status: DC

## 2018-01-24 MED FILL — GUAIATUSSIN AC LIQUID: 100-10 | 7 days supply | Qty: 473 | Fill #0

## 2018-01-24 NOTE — Progress Notes (Signed)
Oncology Nurse Navigator Documentation  Met with pt during final RT to offer support and to celebrate end of radiation treatment.  He was accompanied by his friend who has been providing transportation throughout tmt. I provided friend with a Certificate of Recognition for his supportive care. I provided verbal/written post-RT guidance:  Importance of keeping all follow-up appts, especially those with Nutrition and SLP.  Importance of protecting treatment area from sun.  Continuation of Sonafine application 2-3 times daily until supply exhausted after which transition to OTC lotion with vitamin E. I explained that my role as navigator will continue for several more months and that I will be calling and/or joining him during follow-up visits.   I provided him Epic calendar of appts. I encouraged him to call me with needs/concerns.   He voiced understanding of information provided.  Gayleen Orem, RN, BSN Head & Neck Oncology [place] at [place] [phone]

## 2018-01-29 ENCOUNTER — Encounter: Payer: Self-pay | Admitting: Radiation Oncology

## 2018-01-29 NOTE — Progress Notes (Signed)
  Radiation Oncology         [phone] ________________________________  Name: [name] MRN: [id number]  Date: 01/29/2018  DOB: 1961/03/16  End of Treatment Note  Diagnosis:   Malignant neoplasm of glottis ([facility]) Staging form: Larynx - Glottis, AJCC 8th Edition - Clinical: Stage I (cT1b, cN0, cM0) - Signed by Eppie Gibson, MD on 12/11/2017     Indication for treatment:  Curative       Radiation treatment dates:   12/17/17 - 01/24/18  Site/dose:   Larynx treated to 63 Gy in 28 fx of 2.25 Gy  Beams/energy:   3D / 6X  Narrative: The patient tolerated radiation treatment relatively well.   He endorsed throat pain during treatment that caused difficulty eating. He also endorsed using sonafine cream as directed on skin.  Plan: The patient has completed radiation treatment. The patient will return to radiation oncology clinic for routine followup in one half month. I advised them to call or return sooner if they have any questions or concerns related to their recovery or treatment.  -----------------------------------  Eppie Gibson, MD  This document serves as a record of services personally performed by Eppie Gibson, MD. It was created on his behalf by Linward Natal, a trained medical scribe. The creation of this record is based on the scribe's personal observations and the provider's statements to them. This document has been checked and approved by the attending provider.

## 2018-01-31 ENCOUNTER — Encounter: Payer: Self-pay | Admitting: Radiation Oncology

## 2018-01-31 NOTE — Progress Notes (Signed)
[name] presents for follow up of radiation completed 01/24/18 to his Larynx.   Pain issues, if any: He reports throat pain. He is not taking any thing for pain at this time.  Using a feeding tube?: N/A Weight changes, if any: He is drinking one boost daily.  Wt Readings from Last 3 Encounters:  02/05/18 146 lb 3.2 oz (66.3 kg)  01/22/18 146 lb (66.2 kg)  12/25/17 156 lb 12.8 oz (71.1 kg)   Swallowing issues, if any: He reports pain when swallowing, but is able to eat most foods. He tells me that he eats frozen dinners at time for dinner.  Smoking or chewing tobacco? He is smoking about 1/2 pack daily.  Using fluoride trays daily? No Last ENT visit was on: Not since diagnosis.  Other notable issues, if any:  His neck has healed well. There are areas of past peeling present. He is not using any cream at this time.   BP (!) 104/55   Pulse 67   Temp 97.6 F (36.4 C)   Ht 5\' 7"  (1.702 m)   Wt 146 lb 3.2 oz (66.3 kg)   SpO2 100% Comment: room air  BMI 22.90 kg/m

## 2018-02-05 ENCOUNTER — Ambulatory Visit
Admission: RE | Admit: 2018-02-05 | Discharge: 2018-02-05 | Disposition: A | Discharge status: Home / Self Care | Payer: Medicaid Other | Source: Ambulatory Visit | Attending: Radiation Oncology | Admitting: Radiation Oncology

## 2018-02-05 ENCOUNTER — Encounter: Payer: Self-pay | Admitting: Nutrition

## 2018-02-05 ENCOUNTER — Encounter: Payer: Self-pay | Admitting: Radiation Oncology

## 2018-02-05 VITALS — BP 104/55 | HR 67 | Temp 97.6°F | Ht 67.0 in | Wt 146.2 lb

## 2018-02-05 DIAGNOSIS — Z79899 Other long term (current) drug therapy: Secondary | ICD-10-CM | POA: Insufficient documentation

## 2018-02-05 DIAGNOSIS — Z08 Encounter for follow-up examination after completed treatment for malignant neoplasm: Secondary | ICD-10-CM | POA: Insufficient documentation

## 2018-02-05 DIAGNOSIS — Z923 Personal history of irradiation: Secondary | ICD-10-CM | POA: Diagnosis not present

## 2018-02-05 DIAGNOSIS — C320 Malignant neoplasm of glottis: Secondary | ICD-10-CM

## 2018-02-05 DIAGNOSIS — Z888 Allergy status to other drugs, medicaments and biological substances status: Secondary | ICD-10-CM | POA: Diagnosis not present

## 2018-02-05 NOTE — Progress Notes (Signed)
Radiation Oncology         [phone] ________________________________  Name: [name] MRN: [id number]  Date: 02/05/2018  DOB: [birth year]  Follow-Up Visit Note  CC: Gayland Curry, MD  Jerrell Belfast, MD  Diagnosis and Prior Radiotherapy:       ICD-10-CM   1. Malignant neoplasm of glottis ([place]) C32.0     Malignant neoplasm of glottis ([place]) Staging form: Larynx - Glottis, AJCC 8th Edition - Clinical: Stage I (cT1b, cN0, cM0) - Signed by Eppie Gibson, MD on 12/11/2017  Radiation treatment dates:   12/17/2017 - 01/24/2018 Site/dose:   Larynx treated to 63 Gy in 28 fractions of 2.25 Gy  CHIEF COMPLAINT:  Here for follow-up and surveillance of head and neck cancer  Narrative:  The patient returns today for routine follow-up of radiation completed 2 weeks ago to his larynx. The patient tolerated radiation treatment relatively well with some odynophagia during treatment that caused difficulty eating.    Pain issues, if any: He reports continued throat pain. He is not taking anything for pain at this time.   Using a feeding tube?: N/A  Weight changes, if any: He is drinking one Boost daily. Wt Readings from Last 3 Encounters:  02/05/18 146 lb 3.2 oz (66.3 kg)  01/22/18 146 lb (66.2 kg)  12/25/17 156 lb 12.8 oz (71.1 kg)   Swallowing issues, if any: He reports continued odynophagia but is able to eat most foods.  Smoking or chewing tobacco? He is smoking about 1/2 pack daily.   Using fluoride trays daily? No  Last ENT visit was on: Not since diagnosis.   Other notable issues, if any: His neck has healed well. He was given Sonafine cream during treatment, but he is not using any cream at this time.                     ALLERGIES:  is allergic to lisinopril.  Meds: Current Outpatient Medications  Medication Sig Dispense Refill  . acetaminophen (TYLENOL) 500 MG tablet Take 1-2 tabs every 6 hours prn pain. Do not exceed 7 tablets daily. 120 tablet 1  . atorvastatin  (LIPITOR) 20 MG tablet TAKE 1 TABLET (20 MG TOTAL) BY MOUTH DAILY. 30 tablet 3  . carvedilol (COREG) 6.25 MG tablet TAKE 1 TABLET (6.25 MG TOTAL) BY MOUTH 2 (TWO) TIMES DAILY WITH A MEAL. 60 tablet 3  . divalproex (DEPAKOTE ER) 500 MG 24 hr tablet Take 500 mg by mouth 2 (two) times daily.   2  . lithium carbonate (ESKALITH) 450 MG CR tablet Take 450 mg by mouth 2 (two) times daily.    Marland Kitchen losartan (COZAAR) 100 MG tablet Take 1 tablet (100 mg total) by mouth daily. 90 tablet 3  . pantoprazole (PROTONIX) 40 MG tablet Take 40 mg by mouth daily at 2 PM. In the afternoon.  3  . bisacodyl (DULCOLAX) 5 MG EC tablet Take 1-2 tablets PRN constipation if you go for more than 2 days without a bowel movement. (Patient not taking: Reported on 02/05/2018) 30 tablet 0  . buPROPion (WELLBUTRIN XL) 150 MG 24 hr tablet Take 150 mg by mouth every morning.  2  . fentaNYL 37.5 MCG/HR PT72 Place 37.5 mcg onto the skin every 3 (three) days. (Patient not taking: Reported on 02/05/2018) 2 patch 0  . gabapentin (NEURONTIN) 300 MG capsule Take 1 capsule (300 mg total) by mouth 3 (three) times daily. For pain. (Patient not taking: Reported on 02/05/2018) 90 capsule  0  . guaiFENesin-codeine (ROBITUSSIN AC) 100-10 MG/5ML syrup Swallow 10 mL PO q4-6h prn, take with food or nutritional shake. (Patient not taking: Reported on 02/05/2018) 473 mL 0  . lidocaine (XYLOCAINE) 2 % solution Patient: Swallow 70mL of diluted mixture 20-66min before meals and at bedtime, up to QID for sore throat. (Patient not taking: Reported on 02/05/2018) 250 mL 4  . nicotine (NICODERM CQ - DOSED IN MG/24 HOURS) 14 mg/24hr patch Place 1 patch (14 mg total) onto the skin daily. Apply 21 mg patch daily x 6 wk, then 14mg  patch daily x 2 wk, then 7 mg patch daily x 2 wk (Patient not taking: Reported on 02/05/2018) 14 patch 0  . nicotine (NICODERM CQ - DOSED IN MG/24 HOURS) 21 mg/24hr patch Place 1 patch (21 mg total) onto the skin daily. Apply 21 mg patch daily x 6 wk, then  14mg  patch daily x 2 wk, then 7 mg patch daily x 2 wk (Patient not taking: Reported on 02/05/2018) 14 patch 2  . nicotine (NICODERM CQ - DOSED IN MG/24 HR) 7 mg/24hr patch Place 1 patch (7 mg total) onto the skin daily. Apply 21 mg patch daily x 6 wk, then 14mg  patch daily x 2 wk, then 7 mg patch daily x 2 wk (Patient not taking: Reported on 02/05/2018) 14 patch 0  . oxyCODONE-acetaminophen (PERCOCET/ROXICET) 5-325 MG tablet Take 1-2 tablets by mouth every 6 (six) hours as needed for severe pain. (Patient not taking: Reported on 02/05/2018) 40 tablet 0  . prochlorperazine (COMPAZINE) 10 MG tablet Take 1 tablet (10 mg total) by mouth every 6 (six) hours as needed for nausea or vomiting. (Patient not taking: Reported on 02/05/2018) 30 tablet 0  . senna (SENOKOT) 8.6 MG TABS tablet Take 1-2 tablets (8.6-17.2 mg total) by mouth at bedtime as needed for mild constipation. (Patient not taking: Reported on 02/05/2018) 30 each 1   No current facility-administered medications for this encounter.     Physical Findings: Wt Readings from Last 3 Encounters:  02/05/18 146 lb 3.2 oz (66.3 kg)  01/22/18 146 lb (66.2 kg)  12/25/17 156 lb 12.8 oz (71.1 kg)    height is 5\' 7"  (1.702 m) and weight is 146 lb 3.2 oz (66.3 kg). His temperature is 97.6 F (36.4 C). His blood pressure is 104/55 (abnormal) and his pulse is 67. His oxygen saturation is 100%.    General: Alert and oriented, in no acute distress. HEENT: His uvula is a bit swollen but no lesions are seen in the throat or the mouth. Skin: Skin in treatment fields is still dry but shows satisfactory healing.   Lab Findings: Lab Results  Component Value Date   WBC 7.0 11/20/2017   HGB 15.0 11/20/2017   HCT 44.8 11/20/2017   MCV 97.8 11/20/2017   PLT 145 (L) 11/20/2017    Lab Results  Component Value Date   TSH 1.098 12/17/2017    Radiographic Findings: No results found.  Impression/Plan:    1) Head and Neck Cancer Status: Healing well from radiation  therapy. Advised patient to continue using Sonafine cream and then switch to Vitamin E lotion.  2) Nutritional Status: He is drinking one Boost daily. PEG tube: N/A  3) Risk Factors: The patient has been educated about risk factors including alcohol and tobacco abuse; they understand that avoidance of alcohol and tobacco is important to prevent recurrences as well as other cancers - still smoking  4) Swallowing: He has odynophagia but is able  to eat most foods. Encouraged to continue drinking Boost and eat softer foods.  5) Thyroid function:  Check annually Lab Results  Component Value Date   TSH 1.098 12/17/2017    6) ENT: Follow up with Dr. Wilburn Cornelia in 6 weeks with a laryngoscopy. We will schedule this appointment.  7) Follow-up in June.  The patient was encouraged to call with any issues or questions before then.   _____________________________________   Eppie Gibson, MD  This document serves as a record of services personally performed by Eppie Gibson, MD. It was created on her behalf by Rae Lips, a trained medical scribe. The creation of this record is based on the scribe's personal observations and the provider's statements to them. This document has been checked and approved by the attending provider.

## 2018-02-06 ENCOUNTER — Encounter: Payer: Self-pay | Admitting: Radiation Oncology

## 2018-02-07 ENCOUNTER — Telehealth: Payer: Self-pay | Admitting: *Deleted

## 2018-02-07 NOTE — Telephone Encounter (Signed)
Oncology Nurse Navigator Documentation  Per Dr. Pearlie Oyster guidance, called [facility] to arrange appointment.  Spoke with [place], informed pt completed RT 2/21 for glottic carcinoma, had 2 wk post-tmt follow-up with Dr. Isidore Moos earlier this week.  Requested patient be contacted and post-tmt follow-up with laryngoscopy be arranged with Dr. Wilburn Cornelia in 6 weeks.  She verbalized understanding.  Gayleen Orem, RN, BSN Head & Neck Oncology Nurse [place] at [place] [phone]

## 2018-02-16 ENCOUNTER — Other Ambulatory Visit: Payer: Self-pay | Admitting: Radiation Oncology

## 2018-02-16 DIAGNOSIS — C320 Malignant neoplasm of glottis: Secondary | ICD-10-CM

## 2018-02-18 ENCOUNTER — Ambulatory Visit: Payer: Medicaid Other | Attending: Radiation Oncology

## 2018-03-03 ENCOUNTER — Other Ambulatory Visit: Payer: Self-pay | Admitting: Cardiovascular Disease

## 2018-03-07 ENCOUNTER — Encounter: Payer: Self-pay | Admitting: Emergency Medicine

## 2018-03-07 ENCOUNTER — Encounter
Admission: EM | Disposition: A | Discharge status: Home / Self Care | Payer: Self-pay | Source: Home / Self Care | Attending: Cardiology

## 2018-03-07 ENCOUNTER — Inpatient Hospital Stay
Admission: EM | Admit: 2018-03-07 | Discharge: 2018-03-09 | DRG: 247 | Disposition: A | Discharge status: Home / Self Care | Payer: Medicaid Other | Attending: Internal Medicine | Admitting: Internal Medicine

## 2018-03-07 ENCOUNTER — Other Ambulatory Visit: Payer: Self-pay

## 2018-03-07 DIAGNOSIS — Z923 Personal history of irradiation: Secondary | ICD-10-CM

## 2018-03-07 DIAGNOSIS — I2111 ST elevation (STEMI) myocardial infarction involving right coronary artery: Secondary | ICD-10-CM | POA: Diagnosis present

## 2018-03-07 DIAGNOSIS — Y848 Other medical procedures as the cause of abnormal reaction of the patient, or of later complication, without mention of misadventure at the time of the procedure: Secondary | ICD-10-CM | POA: Diagnosis present

## 2018-03-07 DIAGNOSIS — I2510 Atherosclerotic heart disease of native coronary artery without angina pectoris: Secondary | ICD-10-CM | POA: Diagnosis present

## 2018-03-07 DIAGNOSIS — I739 Peripheral vascular disease, unspecified: Secondary | ICD-10-CM | POA: Diagnosis present

## 2018-03-07 DIAGNOSIS — Z825 Family history of asthma and other chronic lower respiratory diseases: Secondary | ICD-10-CM | POA: Diagnosis not present

## 2018-03-07 DIAGNOSIS — K219 Gastro-esophageal reflux disease without esophagitis: Secondary | ICD-10-CM | POA: Diagnosis present

## 2018-03-07 DIAGNOSIS — E785 Hyperlipidemia, unspecified: Secondary | ICD-10-CM | POA: Diagnosis present

## 2018-03-07 DIAGNOSIS — Z981 Arthrodesis status: Secondary | ICD-10-CM

## 2018-03-07 DIAGNOSIS — F319 Bipolar disorder, unspecified: Secondary | ICD-10-CM | POA: Diagnosis present

## 2018-03-07 DIAGNOSIS — R569 Unspecified convulsions: Secondary | ICD-10-CM | POA: Diagnosis present

## 2018-03-07 DIAGNOSIS — T82855A Stenosis of coronary artery stent, initial encounter: Secondary | ICD-10-CM | POA: Diagnosis present

## 2018-03-07 DIAGNOSIS — I2119 ST elevation (STEMI) myocardial infarction involving other coronary artery of inferior wall: Principal | ICD-10-CM | POA: Diagnosis present

## 2018-03-07 DIAGNOSIS — I5022 Chronic systolic (congestive) heart failure: Secondary | ICD-10-CM | POA: Diagnosis present

## 2018-03-07 DIAGNOSIS — I213 ST elevation (STEMI) myocardial infarction of unspecified site: Secondary | ICD-10-CM

## 2018-03-07 DIAGNOSIS — E440 Moderate protein-calorie malnutrition: Secondary | ICD-10-CM

## 2018-03-07 DIAGNOSIS — F17210 Nicotine dependence, cigarettes, uncomplicated: Secondary | ICD-10-CM | POA: Diagnosis present

## 2018-03-07 DIAGNOSIS — Z8521 Personal history of malignant neoplasm of larynx: Secondary | ICD-10-CM

## 2018-03-07 DIAGNOSIS — I255 Ischemic cardiomyopathy: Secondary | ICD-10-CM | POA: Diagnosis present

## 2018-03-07 DIAGNOSIS — I252 Old myocardial infarction: Secondary | ICD-10-CM

## 2018-03-07 DIAGNOSIS — I110 Hypertensive heart disease with heart failure: Secondary | ICD-10-CM | POA: Diagnosis present

## 2018-03-07 HISTORY — PX: CORONARY/GRAFT ACUTE MI REVASCULARIZATION: CATH118305

## 2018-03-07 HISTORY — PX: LEFT HEART CATH AND CORONARY ANGIOGRAPHY: CATH118249

## 2018-03-07 LAB — LIPID PANEL
Cholesterol: 118 mg/dL (ref 0–200)
HDL: 36 mg/dL — ABNORMAL LOW (ref >40)
LDL Cholesterol: 70 mg/dL (ref 0–99)
Total CHOL/HDL Ratio: 3.3 RATIO
Triglycerides: 61 mg/dL (ref <150)
VLDL: 12 mg/dL (ref 0–40)

## 2018-03-07 LAB — COMPREHENSIVE METABOLIC PANEL
ALT: 22 U/L (ref 17–63)
AST: 23 U/L (ref 15–41)
Albumin: 3.9 g/dL (ref 3.5–5.0)
Alkaline Phosphatase: 55 U/L (ref 38–126)
Anion gap: 10 (ref 5–15)
BUN: 7 mg/dL (ref 6–20)
CO2: 24 mmol/L (ref 22–32)
Calcium: 9.1 mg/dL (ref 8.9–10.3)
Chloride: 107 mmol/L (ref 101–111)
Creatinine, Ser: 0.65 mg/dL (ref 0.61–1.24)
GFR calc Af Amer: >60 mL/min (ref >60)
GFR calc non Af Amer: >60 mL/min (ref >60)
Glucose, Bld: 93 mg/dL (ref 65–99)
Potassium: 4.5 mmol/L (ref 3.5–5.1)
Sodium: 141 mmol/L (ref 135–145)
Total Bilirubin: 0.8 mg/dL (ref 0.3–1.2)
Total Protein: 6.9 g/dL (ref 6.5–8.1)

## 2018-03-07 LAB — CBC WITH DIFFERENTIAL/PLATELET
Basophils Absolute: 0 10*3/uL (ref 0–0.1)
Basophils Relative: 0 %
Eosinophils Absolute: 0 10*3/uL (ref 0–0.7)
Eosinophils Relative: 1 %
HCT: 41.4 % (ref 40.0–52.0)
Hemoglobin: 13.8 g/dL (ref 13.0–18.0)
Lymphocytes Relative: 8 %
Lymphs Abs: 0.6 10*3/uL — ABNORMAL LOW (ref 1.0–3.6)
MCH: 31.7 pg (ref 26.0–34.0)
MCHC: 33.3 g/dL (ref 32.0–36.0)
MCV: 94.9 fL (ref 80.0–100.0)
Monocytes Absolute: 0.5 10*3/uL (ref 0.2–1.0)
Monocytes Relative: 6 %
Neutro Abs: 6.1 10*3/uL (ref 1.4–6.5)
Neutrophils Relative %: 85 %
Platelets: 159 10*3/uL (ref 150–440)
RBC: 4.36 MIL/uL — ABNORMAL LOW (ref 4.40–5.90)
RDW: 14 % (ref 11.5–14.5)
WBC: 7.3 10*3/uL (ref 3.8–10.6)

## 2018-03-07 LAB — PROTIME-INR
INR: 1.49
Prothrombin Time: 17.9 seconds — ABNORMAL HIGH (ref 11.4–15.2)

## 2018-03-07 LAB — POCT ACTIVATED CLOTTING TIME: Activated Clotting Time: 318 seconds

## 2018-03-07 LAB — TROPONIN I: Troponin I: 0.6 ng/mL (ref <0.03)

## 2018-03-07 LAB — MRSA PCR SCREENING: MRSA by PCR: NEGATIVE

## 2018-03-07 LAB — APTT: aPTT: 68 seconds — ABNORMAL HIGH (ref 24–36)

## 2018-03-07 SURGERY — CORONARY/GRAFT ACUTE MI REVASCULARIZATION
Anesthesia: Moderate Sedation

## 2018-03-07 MED ORDER — ASPIRIN 81 MG PO CHEW
81.0000 mg | CHEWABLE_TABLET | Freq: Every day | ORAL | Status: DC
  Administered 2018-03-08 – 2018-03-09 (×2): 81 mg via ORAL
  Filled 2018-03-07 (×2): qty 1

## 2018-03-07 MED ORDER — HYDRALAZINE HCL 20 MG/ML IJ SOLN: 5.0000 mg | INTRAMUSCULAR | Status: AC | PRN

## 2018-03-07 MED ORDER — ATROPINE SULFATE 1 MG/10ML IJ SOSY
PREFILLED_SYRINGE | INTRAMUSCULAR | Status: AC
  Filled 2018-03-07: qty 10

## 2018-03-07 MED ORDER — SODIUM CHLORIDE 0.9 % IV SOLN: INTRAVENOUS | Status: DC

## 2018-03-07 MED ORDER — FENTANYL CITRATE (PF) 100 MCG/2ML IJ SOLN
INTRAMUSCULAR | Status: AC
Start: 2018-03-07 — End: 2018-03-07
  Filled 2018-03-07: qty 2

## 2018-03-07 MED ORDER — HEPARIN (PORCINE) IN NACL 2-0.9 UNIT/ML-% IJ SOLN
INTRAMUSCULAR | Status: AC
  Filled 2018-03-07: qty 1000

## 2018-03-07 MED ORDER — ACETAMINOPHEN 325 MG PO TABS: 650.0000 mg | ORAL_TABLET | ORAL | Status: DC | PRN

## 2018-03-07 MED ORDER — ONDANSETRON HCL 4 MG/2ML IJ SOLN: 4.0000 mg | Freq: Four times a day (QID) | INTRAMUSCULAR | Status: DC | PRN

## 2018-03-07 MED ORDER — SODIUM CHLORIDE 0.9% FLUSH
3.0000 mL | Freq: Two times a day (BID) | INTRAVENOUS | Status: DC
  Administered 2018-03-08 (×3): 3 mL via INTRAVENOUS

## 2018-03-07 MED ORDER — MIDAZOLAM HCL 2 MG/2ML IJ SOLN
INTRAMUSCULAR | Status: AC
  Filled 2018-03-07: qty 2

## 2018-03-07 MED ORDER — TICAGRELOR 90 MG PO TABS
ORAL_TABLET | ORAL | Status: AC
  Filled 2018-03-07: qty 2

## 2018-03-07 MED ORDER — TICAGRELOR 90 MG PO TABS: 90.0000 mg | ORAL_TABLET | Freq: Two times a day (BID) | ORAL | Status: DC

## 2018-03-07 MED ORDER — SODIUM CHLORIDE 0.9 % WEIGHT BASED INFUSION
1.0000 mL/kg/h | INTRAVENOUS | Status: AC
  Administered 2018-03-07: 1 mL/kg/h via INTRAVENOUS

## 2018-03-07 MED ORDER — MIDAZOLAM HCL 2 MG/2ML IJ SOLN
INTRAMUSCULAR | Status: DC | PRN
  Administered 2018-03-07: 1 mg via INTRAVENOUS

## 2018-03-07 MED ORDER — SODIUM CHLORIDE 0.9% FLUSH: 3.0000 mL | INTRAVENOUS | Status: DC | PRN

## 2018-03-07 MED ORDER — ATROPINE SULFATE 1 MG/10ML IJ SOSY
PREFILLED_SYRINGE | INTRAMUSCULAR | Status: DC | PRN
  Administered 2018-03-07: 1 mg via INTRAVENOUS

## 2018-03-07 MED ORDER — LABETALOL HCL 5 MG/ML IV SOLN: 10.0000 mg | INTRAVENOUS | Status: AC | PRN

## 2018-03-07 MED ORDER — TICAGRELOR 90 MG PO TABS
90.0000 mg | ORAL_TABLET | Freq: Two times a day (BID) | ORAL | Status: DC
  Administered 2018-03-08: 90 mg via ORAL
  Filled 2018-03-07 (×3): qty 1

## 2018-03-07 MED ORDER — SODIUM CHLORIDE 0.9 % IV SOLN
INTRAVENOUS | Status: AC | PRN
  Administered 2018-03-07: 1.75 mg/kg/h via INTRAVENOUS

## 2018-03-07 MED ORDER — NITROGLYCERIN 5 MG/ML IV SOLN
INTRAVENOUS | Status: AC
  Filled 2018-03-07: qty 10

## 2018-03-07 MED ORDER — TICAGRELOR 90 MG PO TABS
ORAL_TABLET | ORAL | Status: DC | PRN
  Administered 2018-03-07: 180 mg via ORAL

## 2018-03-07 MED ORDER — BIVALIRUDIN TRIFLUOROACETATE 250 MG IV SOLR
INTRAVENOUS | Status: AC
  Filled 2018-03-07: qty 250

## 2018-03-07 MED ORDER — SODIUM CHLORIDE 0.9 % IV SOLN: 250.0000 mL | INTRAVENOUS | Status: DC | PRN

## 2018-03-07 MED ORDER — HEPARIN SODIUM (PORCINE) 5000 UNIT/ML IJ SOLN: 60.0000 [IU]/kg | Freq: Once | INTRAMUSCULAR | Status: DC

## 2018-03-07 MED ORDER — MORPHINE SULFATE (PF) 2 MG/ML IV SOLN
1.0000 mg | Freq: Four times a day (QID) | INTRAVENOUS | Status: DC | PRN
Start: 2018-03-07 — End: 2018-03-09
  Administered 2018-03-07: 2 mg via INTRAVENOUS
  Administered 2018-03-08: 1 mg via INTRAVENOUS
  Filled 2018-03-07 (×2): qty 1

## 2018-03-07 MED ORDER — IOPAMIDOL (ISOVUE-300) INJECTION 61%
INTRAVENOUS | Status: DC | PRN
  Administered 2018-03-07: 160 mL via INTRA_ARTERIAL

## 2018-03-07 MED ORDER — FENTANYL CITRATE (PF) 100 MCG/2ML IJ SOLN
INTRAMUSCULAR | Status: DC | PRN
  Administered 2018-03-07: 50 ug via INTRAVENOUS

## 2018-03-07 MED ORDER — BIVALIRUDIN BOLUS VIA INFUSION - CUPID
INTRAVENOUS | Status: DC | PRN
  Administered 2018-03-07: 49.66875 mg via INTRAVENOUS

## 2018-03-07 SURGICAL SUPPLY — 18 items
BALLN TREK RX 2.5X15 (BALLOONS) ×3
BALLN ~~LOC~~ EUPHORA RX 3.25X20 (BALLOONS) ×3
BALLOON TREK RX 2.5X15 (BALLOONS) IMPLANT
BALLOON ~~LOC~~ EUPHORA RX 3.25X20 (BALLOONS) IMPLANT
CATH INFINITI 5FR ANG PIGTAIL (CATHETERS) ×2 IMPLANT
CATH INFINITI 5FR JL4 (CATHETERS) ×2 IMPLANT
CATH VISTA GUIDE 6FR JR4 SH (CATHETERS) ×2 IMPLANT
DEVICE CLOSURE MYNXGRIP 6/7F (Vascular Products) ×2 IMPLANT
DEVICE INFLAT 30 PLUS (MISCELLANEOUS) ×2 IMPLANT
KIT MANI 3VAL PERCEP (MISCELLANEOUS) ×3 IMPLANT
NDL PERC 18GX7CM (NEEDLE) IMPLANT
NEEDLE PERC 18GX7CM (NEEDLE) ×3 IMPLANT
PACK CARDIAC CATH (CUSTOM PROCEDURE TRAY) ×3 IMPLANT
SHEATH AVANTI 6FR X 11CM (SHEATH) ×2 IMPLANT
STENT RESOLUTE ONYX3.0X38 (Permanent Stent) ×2 IMPLANT
WIRE ASAHI PROWATER 180CM (WIRE) ×2 IMPLANT
WIRE G HI TQ BMW 190 (WIRE) ×2 IMPLANT
WIRE GUIDERIGHT .035X150 (WIRE) ×2 IMPLANT

## 2018-03-07 NOTE — Consult Note (Signed)
[facility] Cardiology  CARDIOLOGY CONSULT NOTE  Patient ID: [name] MRN: [id number] DOB/AGE: 08-13-1961 57 y.o.  Admit date: 03/07/2018 Referring Physician Corky Downs Primary Physician [facility] Primary Cardiologist [place] Reason for Consultation inferior ST elevation myocardial infarction  HPI: The patient is a 57 year old gentleman referred for inferior ST elevation myocardial infarction.  The patient has known coronary artery disease, status post PCI RCA at [place] and 2008.  Most recent cardiac catheterization 06/2013 revealed patent stent RCA, chronically occluded left circumflex, and insignificant disease LAD with mildlyreduced left ventricular function, with LVEF of 35%.  The patient presents with acute onset of severe 10 out of 10 substernal chest pain.  ECG reveals ST elevations in 2 3 and aVF consistent with inferior ST elevation myocardial infarction.  Review of systems complete and found to be negative unless listed above     Past Medical History:  Diagnosis Date  . Anxiety   . Bipolar affective ([place])    takes Lithium daily  . Broken fingers   . Coronary artery disease    a. s/p PCI to RCA at [facility]; b. s/p PCI to RCA '08; c. Cath 05/2012 showed known occluded LCx, RCA stent w/ mild restenosis, LAD no significant dz, EF 35%; d. Cardiac cath in July of 2014 showed no significant change.   . Depression   . GERD (gastroesophageal reflux disease)    takes Protonix daily  . Hepatitis C    "diagnosed in the past 2 wk" (10/22/2012)  . History of ETOH abuse    quit 2007  . History of radiation exposure 12/17/17- 01/24/18   Larynx treated to 63 gy in 28 fx of 2.25 Gy  . Hyperlipidemia   . Hypertension    "pt. denies - no longer taking blood pressure medications"  . Myocardial infarction ([place]) 1996; 1997; 2000's   "total of 3" (10/22/2012)  . PAD (peripheral artery disease) ([place])    2010 PTA & stent to left CIA & left SFA; 05/2012 PTA & stent to distal LCIA; 10/2012 thrombectomy  and stents to [place]  . Schizophrenia ([place])   . Seizures ([place])    "when I get anxious" (Stated last seizure three months ago)  . Tobacco abuse   . Urinary frequency     Past Surgical History:  Procedure Laterality Date  . ABDOMINAL AORTAGRAM N/A 10/22/2012   Procedure: ABDOMINAL Maxcine Ham;  Surgeon: Wellington Hampshire, MD;  Location: [place] CATH LAB;  Service: Cardiovascular;  Laterality: N/A;  . ABDOMINAL AORTAGRAM N/A 11/11/2014   Procedure: ABDOMINAL Maxcine Ham;  Surgeon: Wellington Hampshire, MD;  Location: [place] CATH LAB;  Service: Cardiovascular;  Laterality: N/A;  . ANTERIOR CERVICAL DECOMP/DISCECTOMY FUSION  01/2011   plate & screw placement   . ANTERIOR CERVICAL DECOMP/DISCECTOMY FUSION N/A 01/26/2017   Procedure: ANTERIOR CERVICAL DECOMPRESSION/DISCECTOMY FUSION CERVICAL FOUR- CERVICAL FIVE;  Surgeon: Ashok Pall, MD;  Location: [place];  Service: Neurosurgery;  Laterality: N/A;  ANTERIOR CERVICAL DECOMPRESSION/DISCECTOMY FUSION CERVICAL FOUR- CERVICAL FIVE  . AORTA - BILATERAL FEMORAL ARTERY BYPASS GRAFT N/A 11/20/2014   Procedure: AORTA BIFEMORAL BYPASS GRAFT;  Surgeon: Mal Misty, MD;  Location: [place];  Service: Vascular;  Laterality: N/A;  . BACK SURGERY    . BRAIN SURGERY  1989   subdural hematoma  . CARDIAC CATHETERIZATION     Stent - 12  . CHOLECYSTECTOMY N/A 06/29/2017   Procedure: LAPAROSCOPIC CHOLECYSTECTOMY WITH INTRAOPERATIVE CHOLANGIOGRAM, POSSIBLE OPEN;  Surgeon: Robert Bellow, MD;  Location: [facility] ORS;  Service: General;  Laterality: N/A;  . CORONARY ANGIOPLASTY WITH STENT PLACEMENT  03/2007   to RCA  . HERNIA REPAIR    . ILIAC ARTERY STENT  10/22/2012   "2" (10/22/2012)  . ILIAC ARTERY STENT  06/2012   left  . INCISIONAL HERNIA REPAIR N/A 11/08/2015   Procedure: INCISIONAL HERNIA REPAIR WITH MYOFASCIAL RELEASE;  Surgeon: Rolm Bookbinder, MD;  Location: [place];  Service: General;  Laterality: N/A;  . INSERTION OF MESH N/A 11/08/2015   Procedure: INSERTION OF  MESH;  Surgeon: Rolm Bookbinder, MD;  Location: [place];  Service: General;  Laterality: N/A;  . LEFT HEART CATHETERIZATION WITH CORONARY ANGIOGRAM N/A 05/29/2012   Procedure: LEFT HEART CATHETERIZATION WITH CORONARY ANGIOGRAM;  Surgeon: Wellington Hampshire, MD;  Location: [place] CATH LAB;  Service: Cardiovascular;  Laterality: N/A;  . LIVER BIOPSY  10/2012  . LOWER EXTREMITY ANGIOGRAM N/A 05/29/2012   Procedure: LOWER EXTREMITY ANGIOGRAM;  Surgeon: Wellington Hampshire, MD;  Location: [place] CATH LAB;  Service: Cardiovascular;  Laterality: N/A;  . MICROLARYNGOSCOPY WITH CO2 LASER AND EXCISION OF VOCAL CORD LESION N/A 11/21/2017   Procedure: MICROLARYNGOSCOPY WITH EXCISION OF VOCAL CORD LESION;  Surgeon: Jerrell Belfast, MD;  Location: [place];  Service: ENT;  Laterality: N/A;  . PERCUTANEOUS STENT INTERVENTION Left 05/29/2012   Procedure: PERCUTANEOUS STENT INTERVENTION;  Surgeon: Wellington Hampshire, MD;  Location: [place] CATH LAB;  Service: Cardiovascular;  Laterality: Left;  . POSTERIOR FUSION CERVICAL SPINE  2012  . SUBDURAL HEMATOMA EVACUATION VIA CRANIOTOMY      Medications Prior to Admission  Medication Sig Dispense Refill Last Dose  . acetaminophen (TYLENOL) 500 MG tablet Take 1-2 tabs every 6 hours prn pain. Do not exceed 7 tablets daily. 120 tablet 1 Taking  . atorvastatin (LIPITOR) 20 MG tablet TAKE 1 TABLET (20 MG TOTAL) BY MOUTH DAILY. 30 tablet 3 Taking  . bisacodyl (DULCOLAX) 5 MG EC tablet Take 1-2 tablets PRN constipation if you go for more than 2 days without a bowel movement. (Patient not taking: Reported on 02/05/2018) 30 tablet 0 Not Taking  . buPROPion (WELLBUTRIN XL) 150 MG 24 hr tablet Take 150 mg by mouth every morning.  2 Not Taking  . carvedilol (COREG) 6.25 MG tablet TAKE 1 TABLET (6.25 MG TOTAL) BY MOUTH 2 (TWO) TIMES DAILY WITH A MEAL. 60 tablet 3   . divalproex (DEPAKOTE ER) 500 MG 24 hr tablet Take 500 mg by mouth 2 (two) times daily.   2 Taking  . fentaNYL 37.5 MCG/HR PT72 Place 37.5 mcg  onto the skin every 3 (three) days. (Patient not taking: Reported on 02/05/2018) 2 patch 0 Not Taking  . gabapentin (NEURONTIN) 300 MG capsule Take 1 capsule (300 mg total) by mouth 3 (three) times daily. For pain. (Patient not taking: Reported on 02/05/2018) 90 capsule 0 Not Taking  . guaiFENesin-codeine (ROBITUSSIN AC) 100-10 MG/5ML syrup Swallow 10 mL PO q4-6h prn, take with food or nutritional shake. (Patient not taking: Reported on 02/05/2018) 473 mL 0 Not Taking  . lidocaine (XYLOCAINE) 2 % solution Patient: Swallow 63mL of diluted mixture 20-30min before meals and at bedtime, up to QID for sore throat. (Patient not taking: Reported on 02/05/2018) 250 mL 4 Not Taking  . lithium carbonate (ESKALITH) 450 MG CR tablet Take 450 mg by mouth 2 (two) times daily.   Taking  . losartan (COZAAR) 100 MG tablet Take 1 tablet (100 mg total) by mouth daily. 90 tablet 3 Taking  . nicotine (NICODERM  CQ - DOSED IN MG/24 HOURS) 14 mg/24hr patch Place 1 patch (14 mg total) onto the skin daily. Apply 21 mg patch daily x 6 wk, then 14mg  patch daily x 2 wk, then 7 mg patch daily x 2 wk (Patient not taking: Reported on 02/05/2018) 14 patch 0 Not Taking  . nicotine (NICODERM CQ - DOSED IN MG/24 HOURS) 21 mg/24hr patch Place 1 patch (21 mg total) onto the skin daily. Apply 21 mg patch daily x 6 wk, then 14mg  patch daily x 2 wk, then 7 mg patch daily x 2 wk (Patient not taking: Reported on 02/05/2018) 14 patch 2 Not Taking  . nicotine (NICODERM CQ - DOSED IN MG/24 HR) 7 mg/24hr patch Place 1 patch (7 mg total) onto the skin daily. Apply 21 mg patch daily x 6 wk, then 14mg  patch daily x 2 wk, then 7 mg patch daily x 2 wk (Patient not taking: Reported on 02/05/2018) 14 patch 0 Not Taking  . oxyCODONE-acetaminophen (PERCOCET/ROXICET) 5-325 MG tablet Take 1-2 tablets by mouth every 6 (six) hours as needed for severe pain. (Patient not taking: Reported on 02/05/2018) 40 tablet 0 Not Taking  . pantoprazole (PROTONIX) 40 MG tablet Take 40 mg by  mouth daily at 2 PM. In the afternoon.  3 Taking  . prochlorperazine (COMPAZINE) 10 MG tablet Take 1 tablet (10 mg total) by mouth every 6 (six) hours as needed for nausea or vomiting. (Patient not taking: Reported on 02/05/2018) 30 tablet 0 Not Taking  . senna (SENOKOT) 8.6 MG TABS tablet Take 1-2 tablets (8.6-17.2 mg total) by mouth at bedtime as needed for mild constipation. (Patient not taking: Reported on 02/05/2018) 30 each 1 Not Taking   Social History   Socioeconomic History  . Marital status: Divorced    Spouse name: Not on file  . Number of children: Not on file  . Years of education: Not on file  . Highest education level: Not on file  Occupational History  . Occupation: Disabled    Fish farm manager: UNEMPLOYED  Social Needs  . Financial resource strain: Not on file  . Food insecurity:    Worry: Not on file    Inability: Not on file  . Transportation needs:    Medical: Not on file    Non-medical: Not on file  Tobacco Use  . Smoking status: Current Every Day Smoker    Packs/day: 1.00    Years: 40.00    Pack years: 40.00    Types: Cigarettes  . Smokeless tobacco: Never Used  . Tobacco comment: "ive slowed down recently"   Substance and Sexual Activity  . Alcohol use: Yes    Alcohol/week: 3.6 oz    Types: 6 Cans of beer per week  . Drug use: No  . Sexual activity: Yes  Lifestyle  . Physical activity:    Days per week: Not on file    Minutes per session: Not on file  . Stress: Not on file  Relationships  . Social connections:    Talks on phone: Not on file    Gets together: Not on file    Attends religious service: Not on file    Active member of club or organization: Not on file    Attends meetings of clubs or organizations: Not on file    Relationship status: Not on file  . Intimate partner violence:    Fear of current or ex partner: Not on file    Emotionally abused: Not on file  Physically abused: Not on file    Forced sexual activity: Not on file  Other Topics  Concern  . Not on file  Social History Narrative   17 years Bipolar/ADHD.   Pt gets regular exercise.    Family History  Problem Relation Age of Onset  . Emphysema Father   . COPD Mother   . Diabetes Other   . Alcohol abuse Other   . Hypertension Other   . Hyperlipidemia Other   . Seizures Other       Review of systems complete and found to be negative unless listed above      PHYSICAL EXAM  General: Well developed, well nourished, in no acute distress HEENT:  Normocephalic and atramatic Neck:  No JVD.  Lungs: Clear bilaterally to auscultation and percussion. Heart: HRRR . Normal S1 and S2 without gallops or murmurs.  Abdomen: Bowel sounds are positive, abdomen soft and non-tender  Msk:  Back normal, normal gait. Normal strength and tone for age. Extremities: No clubbing, cyanosis or edema.   Neuro: Alert and oriented X 3. Psych:  Good affect, responds appropriately  Labs:   Lab Results  Component Value Date   WBC 7.0 11/20/2017   HGB 15.0 11/20/2017   HCT 44.8 11/20/2017   MCV 97.8 11/20/2017   PLT 145 (L) 11/20/2017   No results for input(s): NA, K, CL, CO2, BUN, CREATININE, CALCIUM, PROT, BILITOT, ALKPHOS, ALT, AST, GLUCOSE in the last 168 hours.  Invalid input(s): LABALBU Lab Results  Component Value Date   TROPONINI <0.03 12/31/2016    Lab Results  Component Value Date   CHOL 154 09/15/2013   Lab Results  Component Value Date   HDL 54 09/15/2013   Lab Results  Component Value Date   LDLCALC 82 09/15/2013   Lab Results  Component Value Date   TRIG 143 11/25/2014   TRIG 92 09/15/2013   Lab Results  Component Value Date   CHOLHDL 2.9 09/15/2013   No results found for: LDLDIRECT    Radiology: No results found.  EKG: Inferior ST elevation myocardial infarction  ASSESSMENT AND PLAN:   1.  Inferior ST elevation myocardial infarction in patient with known coronary disease, with history of stent mid RCA, occluded left circumflex with mild  to moderate reduced left ventricular function  Recommendations  1.  Emergent cardiac catheterization and probable primary PCI  Signed: Isaias Cowman MD,PhD, [facility] 03/07/2018, 3:16 PM

## 2018-03-07 NOTE — ED Triage Notes (Signed)
Pt comes into the ED via [facility] from home c/o left side chest pain that radiates into the neck and arm.  Patient has h/o 3 MI in the past.  Patient alert and oriented x4 and has even and unlabored respirations.

## 2018-03-07 NOTE — Progress Notes (Signed)
CRITICAL VALUE ALERT  Critical Value:  Troponin 0.60  Date & Time Notied:  03/07/18 17:01  Provider Notified: Dr. Saralyn Pilar  Orders Received/Actions taken: MD acknowledged, ordered serial troponins

## 2018-03-07 NOTE — ED Provider Notes (Signed)
[facility] Emergency Department Provider Note   ____________________________________________    I have reviewed the triage vital signs and the nursing notes.   HISTORY  Chief Complaint Chest pain  HPI Limited as patient taken rapidly to Cath Lab  HPI [name] is a 57 y.o. male with a history of coronary artery disease who presents with complaints of chest pain.  Patient called EMS for moderate to severe substernal chest pressure.  He reports a history of for heart attacks in the past.  Most recently at [facility].  Denies fevers or chills or shortness of breath.  EMS gave 325 of aspirin.  Past Medical History:  Diagnosis Date  . Anxiety   . Bipolar affective ([place])    takes Lithium daily  . Broken fingers   . Coronary artery disease    a. s/p PCI to RCA at [facility]; b. s/p PCI to RCA '08; c. Cath 05/2012 showed known occluded LCx, RCA stent w/ mild restenosis, LAD no significant dz, EF 35%; d. Cardiac cath in July of 2014 showed no significant change.   . Depression   . GERD (gastroesophageal reflux disease)    takes Protonix daily  . Hepatitis C    "diagnosed in the past 2 wk" (10/22/2012)  . History of ETOH abuse    quit 2007  . History of radiation exposure 12/17/17- 01/24/18   Larynx treated to 63 gy in 28 fx of 2.25 Gy  . Hyperlipidemia   . Hypertension    "pt. denies - no longer taking blood pressure medications"  . Myocardial infarction ([place]) 1996; 1997; 2000's   "total of 3" (10/22/2012)  . PAD (peripheral artery disease) ([place])    2010 PTA & stent to left CIA & left SFA; 05/2012 PTA & stent to distal LCIA; 10/2012 thrombectomy and stents to [place]  . Schizophrenia ([place])   . Seizures ([place])    "when I get anxious" (Stated last seizure three months ago)  . Tobacco abuse   . Urinary frequency     Patient Active Problem List   Diagnosis Date Noted  . 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 Cholecystitis 06/29/2017  . Osteoarthritis of spine with radiculopathy, cervical region 01/26/2017  . Gallbladder polyp 06/01/2016  . Rib pain on right side 06/01/2016  . ETOH abuse 11/24/2014  . Bipolar disorder, currently in remission ([place]) 11/24/2014  . Alcohol dependence with withdrawal with complication ([place]) 44/02/4741  . Chronic hepatitis C ([place]) 09/12/2013  . Cardiomyopathy, ischemic 06/09/2013  . PAD (peripheral artery disease) ([place]) 05/23/2012  . HTN (hypertension) 10/09/2011  . CAROTID BRUIT 12/19/2010  . DYSPNEA 06/15/2010  . Hyperlipidemia 05/06/2010  . TOBACCO USER 05/06/2010  . CAD, NATIVE VESSEL 05/06/2010  . 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 ABDOMINAL AORTAGRAM N/A 10/22/2012   Procedure: ABDOMINAL Maxcine Ham;  Surgeon: Wellington Hampshire, MD;  Location: [place] CATH LAB;  Service: Cardiovascular;  Laterality: N/A;  . ABDOMINAL AORTAGRAM N/A 11/11/2014   Procedure: ABDOMINAL Maxcine Ham;  Surgeon: Wellington Hampshire, MD;  Location: [place] CATH LAB;  Service: Cardiovascular;  Laterality: N/A;  . ANTERIOR CERVICAL DECOMP/DISCECTOMY FUSION  01/2011   plate & screw placement   . ANTERIOR CERVICAL DECOMP/DISCECTOMY FUSION N/A 01/26/2017   Procedure: ANTERIOR CERVICAL DECOMPRESSION/DISCECTOMY FUSION CERVICAL FOUR- CERVICAL FIVE;  Surgeon: Ashok Pall, MD;  Location:  [place] OR;  Service: Neurosurgery;  Laterality: N/A;  ANTERIOR CERVICAL DECOMPRESSION/DISCECTOMY FUSION CERVICAL FOUR- CERVICAL FIVE  . AORTA - BILATERAL FEMORAL ARTERY BYPASS GRAFT N/A 11/20/2014   Procedure: AORTA BIFEMORAL BYPASS GRAFT;  Surgeon: Mal Misty, MD;  Location: [place];  Service: Vascular;  Laterality: N/A;  . BACK SURGERY    . BRAIN SURGERY  1989   subdural hematoma  . CARDIAC CATHETERIZATION     Stent - 12  . CHOLECYSTECTOMY N/A 06/29/2017   Procedure: LAPAROSCOPIC CHOLECYSTECTOMY WITH  INTRAOPERATIVE CHOLANGIOGRAM, POSSIBLE OPEN;  Surgeon:  Bellow, MD;  Location: [facility] ORS;  Service: General;  Laterality: N/A;  . CORONARY ANGIOPLASTY WITH STENT PLACEMENT  03/2007   to RCA  . HERNIA REPAIR    . ILIAC ARTERY STENT  10/22/2012   "2" (10/22/2012)  . ILIAC ARTERY STENT  06/2012   left  . INCISIONAL HERNIA REPAIR N/A 11/08/2015   Procedure: INCISIONAL HERNIA REPAIR WITH MYOFASCIAL RELEASE;  Surgeon: Rolm Bookbinder, MD;  Location: [place];  Service: General;  Laterality: N/A;  . INSERTION OF MESH N/A 11/08/2015   Procedure: INSERTION OF MESH;  Surgeon: Rolm Bookbinder, MD;  Location: [place];  Service: General;  Laterality: N/A;  . LEFT HEART CATHETERIZATION WITH CORONARY ANGIOGRAM N/A 05/29/2012   Procedure: LEFT HEART CATHETERIZATION WITH CORONARY ANGIOGRAM;  Surgeon: Wellington Hampshire, MD;  Location: [place] CATH LAB;  Service: Cardiovascular;  Laterality: N/A;  . LIVER BIOPSY  10/2012  . LOWER EXTREMITY ANGIOGRAM N/A 05/29/2012   Procedure: LOWER EXTREMITY ANGIOGRAM;  Surgeon: Wellington Hampshire, MD;  Location: [place] CATH LAB;  Service: Cardiovascular;  Laterality: N/A;  . MICROLARYNGOSCOPY WITH CO2 LASER AND EXCISION OF VOCAL CORD LESION N/A 11/21/2017   Procedure: MICROLARYNGOSCOPY WITH EXCISION OF VOCAL CORD LESION;  Surgeon: Jerrell Belfast, MD;  Location: [place];  Service: ENT;  Laterality: N/A;  . PERCUTANEOUS STENT INTERVENTION Left 05/29/2012   Procedure: PERCUTANEOUS STENT INTERVENTION;  Surgeon: Wellington Hampshire, MD;  Location: [place] CATH LAB;  Service: Cardiovascular;  Laterality: Left;  . POSTERIOR FUSION CERVICAL SPINE  2012  . SUBDURAL HEMATOMA EVACUATION VIA CRANIOTOMY      Prior to Admission medications   Medication Sig Start Date End Date Taking? Authorizing Provider  acetaminophen (TYLENOL) 500 MG tablet Take 1-2 tabs every 6 hours prn pain. Do not exceed 7 tablets daily. 12/24/17   Eppie Gibson, MD  atorvastatin (LIPITOR) 20 MG tablet TAKE 1 TABLET (20 MG TOTAL)  BY MOUTH DAILY. 12/03/17   Wellington Hampshire, MD  bisacodyl (DULCOLAX) 5 MG EC tablet Take 1-2 tablets PRN constipation if you go for more than 2 days without a bowel movement. Patient not taking: Reported on 02/05/2018 01/15/18   Eppie Gibson, MD  buPROPion (WELLBUTRIN XL) 150 MG 24 hr tablet Take 150 mg by mouth every morning. 05/20/17   [provider]  carvedilol (COREG) 6.25 MG tablet TAKE 1 TABLET (6.25 MG TOTAL) BY MOUTH 2 (TWO) TIMES DAILY WITH A MEAL. 03/04/18   Wellington Hampshire, MD  divalproex (DEPAKOTE ER) 500 MG 24 hr tablet Take 500 mg by mouth 2 (two) times daily.  06/17/17   [provider]  fentaNYL 37.5 MCG/HR PT72 Place 37.5 mcg onto the skin every 3 (three) days. Patient not taking: Reported on 02/05/2018 01/09/18   Eppie Gibson, MD  gabapentin (NEURONTIN) 300 MG capsule Take 1 capsule (300 mg total) by mouth 3 (three) times daily. For pain. Patient not taking: Reported on 02/05/2018 01/15/18  Eppie Gibson, MD  guaiFENesin-codeine [facility]) 100-10 MG/5ML syrup Swallow 10 mL PO q4-6h prn, take with food or nutritional shake. Patient not taking: Reported on 02/05/2018 01/24/18   Gery Pray, MD  lidocaine (XYLOCAINE) 2 % solution Patient: Swallow 9mL of diluted mixture 20-61min before meals and at bedtime, up to QID for sore throat. Patient not taking: Reported on 02/05/2018 12/24/17   Eppie Gibson, MD  lithium carbonate (ESKALITH) 450 MG CR tablet Take 450 mg by mouth 2 (two) times daily.    [provider]  losartan (COZAAR) 100 MG tablet Take 1 tablet (100 mg total) by mouth daily. 07/26/17 11/15/18  Wellington Hampshire, MD  nicotine (NICODERM CQ - DOSED IN MG/24 HOURS) 14 mg/24hr patch Place 1 patch (14 mg total) onto the skin daily. Apply 21 mg patch daily x 6 wk, then 14mg  patch daily x 2 wk, then 7 mg patch daily x 2 wk Patient not taking: Reported on 02/05/2018 12/11/17   Eppie Gibson, MD  nicotine (NICODERM CQ - DOSED IN MG/24 HOURS) 21 mg/24hr patch Place  1 patch (21 mg total) onto the skin daily. Apply 21 mg patch daily x 6 wk, then 14mg  patch daily x 2 wk, then 7 mg patch daily x 2 wk Patient not taking: Reported on 02/05/2018 12/11/17   Eppie Gibson, MD  nicotine (NICODERM CQ - DOSED IN MG/24 HR) 7 mg/24hr patch Place 1 patch (7 mg total) onto the skin daily. Apply 21 mg patch daily x 6 wk, then 14mg  patch daily x 2 wk, then 7 mg patch daily x 2 wk Patient not taking: Reported on 02/05/2018 12/11/17   Eppie Gibson, MD  oxyCODONE-acetaminophen (PERCOCET/ROXICET) 5-325 MG tablet Take 1-2 tablets by mouth every 6 (six) hours as needed for severe pain. Patient not taking: Reported on 02/05/2018 01/18/18   Bruning, Ashlyn, PA-C  pantoprazole (PROTONIX) 40 MG tablet Take 40 mg by mouth daily at 2 PM. In the afternoon. 06/10/17   [provider]  prochlorperazine (COMPAZINE) 10 MG tablet Take 1 tablet (10 mg total) by mouth every 6 (six) hours as needed for nausea or vomiting. Patient not taking: Reported on 02/05/2018 01/03/18   Gery Pray, MD  senna (SENOKOT) 8.6 MG TABS tablet Take 1-2 tablets (8.6-17.2 mg total) by mouth at bedtime as needed for mild constipation. Patient not taking: Reported on 02/05/2018 12/31/17   Eppie Gibson, MD     Allergies Lisinopril  Family History  Problem Relation Age of Onset  . Emphysema Father   . COPD Mother   . Diabetes Other   . Alcohol abuse Other   . Hypertension Other   . Hyperlipidemia Other   . Seizures Other     Social History Social History   Tobacco Use  . Smoking status: Current Every Day Smoker    Packs/day: 1.00    Years: 40.00    Pack years: 40.00    Types: Cigarettes  . Smokeless tobacco: Never Used  . Tobacco comment: "ive slowed down recently"   Substance Use Topics  . Alcohol use: Yes    Alcohol/week: 3.6 oz    Types: 6 Cans of beer per week  . Drug use: No    Review of Systems Limited as the patient taken rapidly to Cath Lab  Constitutional: Positive  dizziness  Cardiovascular: Chest pain as above Respiratory: Denies shortness of breath. Gastrointestinal: No nausea, no vomiting.       ____________________________________________   PHYSICAL EXAM:  VITAL SIGNS: ED  Triage Vitals  Enc Vitals Group     BP 03/07/18 1356 (!) 163/98     Pulse Rate 03/07/18 1356 88     Resp --      Temp 03/07/18 1358 97.7 F (36.5 C)     Temp Source 03/07/18 1358 Oral     SpO2 03/07/18 1356 98 %     Weight --      Height --      Head Circumference --      Peak Flow --      Pain Score 03/07/18 1356 10     Pain Loc --      Pain Edu? --      Excl. in [place]? --     Constitutional: Alert and oriented.  Eyes: Conjunctivae are normal.  Head: Atraumatic. Nose: No congestion/rhinnorhea. Mouth/Throat: Mucous membranes are dry  Cardiovascular: Normal rate, regular rhythm. Grossly normal heart sounds.  Good peripheral circulation. Respiratory: Normal respiratory effort.  No retractions. Lungs CTAB. Gastrointestinal: Soft and nontender. No distention.   Musculoskeletal: No lower extremity tenderness nor edema.  Warm and well perfused Neurologic:  Normal speech and language. No gross focal neurologic deficits are appreciated.  Skin:  Skin is warm, dry and intact. No rash noted. Psychiatric: Mood and affect are normal. Speech and behavior are normal.  ____________________________________________   LABS (all labs ordered are listed, but only abnormal results are displayed)  Labs Reviewed  CBC WITH DIFFERENTIAL/PLATELET  PROTIME-INR  APTT  COMPREHENSIVE METABOLIC PANEL  TROPONIN I  LIPID PANEL   ____________________________________________  EKG ED ECG REPORT I, Lavonia Drafts, the attending physician, personally viewed and interpreted this ECG.  Date: 03/07/2018 EMS ekg transmitted to [place] Rhythm: normal sinus rhythm QRS Axis: normal Intervals: normal ST/T Wave abnormalities: st elevation Narrative Interpretation: Inferior ST elevation  MI  STEMI Paged, Cardiology waiting for patient in ED  ____________________________________________  RADIOLOGY  none ____________________________________________   PROCEDURES  Procedure(s) performed: No  Procedures   Critical Care performed:No ____________________________________________   INITIAL IMPRESSION / ASSESSMENT AND PLAN / ED COURSE  Pertinent labs & imaging results that were available during my care of the patient were reviewed by me and considered in my medical decision making (see chart for details).  EMS transmitted EKG consistent with STEMI to [place], cardiology notified.  Dr. Saralyn Pilar awaiting patient in the ED, left on EMS stretcher, no heparin per cardiology.  To Cath Lab     ____________________________________________   FINAL CLINICAL IMPRESSION(S) / ED DIAGNOSES  Final diagnoses:  ST elevation myocardial infarction (STEMI), unspecified artery ([place])        Note:  This document was prepared using Dragon voice recognition software and may include unintentional dictation errors.   Lavonia Drafts, MD 03/07/18 [phone]

## 2018-03-07 NOTE — Progress Notes (Signed)
Chaplain responded to a Code Stemi. Chaplain prayed for staff and Pt. No family but on the way. Ch. Checked lobby no family.    03/07/18 1400  Clinical Encounter Type  Visited With Patient  Visit Type Initial  Referral From Physician  Spiritual Encounters  Spiritual Needs Prayer

## 2018-03-07 NOTE — H&P (Addendum)
[name] at [place] NAME: [name]    MR#:  [id number]  DATE OF BIRTH:  [birth year]  DATE OF ADMISSION:  03/07/2018  PRIMARY CARE PHYSICIAN: Gayland Curry, MD   REQUESTING/REFERRING PHYSICIAN: Dr. Corky Downs  CHIEF COMPLAINT:   Chest pain 10 out of 10 this morning HISTORY OF PRESENT ILLNESS:  [name]  is a 57 y.o. male with a known history of coronary artery disease status post stent in the past, bipolar disorder, ongoing tobacco abuse, hyperlipidemia comes to the emergency room brought in by EMS after patient started having severe chest pain. EKG and route showed acute inferior wall MI. Code STEMI was initiated. Patient went to cardiac cath and underwent stent placement times two in mid to distal RCA and ostium circumflex. Patient post cath is doing well. He is having minimal chest pain. Hemodynamically stable  PAST MEDICAL HISTORY:   Past Medical History:  Diagnosis Date  . Anxiety   . Bipolar affective ([place])    takes Lithium daily  . Broken fingers   . Coronary artery disease    a. s/p PCI to RCA at [facility]; b. s/p PCI to RCA '08; c. Cath 05/2012 showed known occluded LCx, RCA stent w/ mild restenosis, LAD no significant dz, EF 35%; d. Cardiac cath in July of 2014 showed no significant change.   . Depression   . GERD (gastroesophageal reflux disease)    takes Protonix daily  . Hepatitis C    "diagnosed in the past 2 wk" (10/22/2012)  . History of ETOH abuse    quit 2007  . History of radiation exposure 12/17/17- 01/24/18   Larynx treated to 63 gy in 28 fx of 2.25 Gy  . Hyperlipidemia   . Hypertension    "pt. denies - no longer taking blood pressure medications"  . Myocardial infarction ([place]) 1996; 1997; 2000's   "total of 3" (10/22/2012)  . PAD (peripheral artery disease) ([place])    2010 PTA & stent to left CIA & left SFA; 05/2012 PTA & stent to distal LCIA; 10/2012 thrombectomy and stents to [place]  .  Schizophrenia ([place])   . Seizures ([place])    "when I get anxious" (Stated last seizure three months ago)  . Tobacco abuse   . Urinary frequency     PAST SURGICAL HISTOIRY:   Past Surgical History:  Procedure Laterality Date  . ABDOMINAL AORTAGRAM N/A 10/22/2012   Procedure: ABDOMINAL Maxcine Ham;  Surgeon: Wellington Hampshire, MD;  Location: [place] CATH LAB;  Service: Cardiovascular;  Laterality: N/A;  . ABDOMINAL AORTAGRAM N/A 11/11/2014   Procedure: ABDOMINAL Maxcine Ham;  Surgeon: Wellington Hampshire, MD;  Location: [place] CATH LAB;  Service: Cardiovascular;  Laterality: N/A;  . ANTERIOR CERVICAL DECOMP/DISCECTOMY FUSION  01/2011   plate & screw placement   . ANTERIOR CERVICAL DECOMP/DISCECTOMY FUSION N/A 01/26/2017   Procedure: ANTERIOR CERVICAL DECOMPRESSION/DISCECTOMY FUSION CERVICAL FOUR- CERVICAL FIVE;  Surgeon: Ashok Pall, MD;  Location: [place];  Service: Neurosurgery;  Laterality: N/A;  ANTERIOR CERVICAL DECOMPRESSION/DISCECTOMY FUSION CERVICAL FOUR- CERVICAL FIVE  . AORTA - BILATERAL FEMORAL ARTERY BYPASS GRAFT N/A 11/20/2014   Procedure: AORTA BIFEMORAL BYPASS GRAFT;  Surgeon: Mal Misty, MD;  Location: [place];  Service: Vascular;  Laterality: N/A;  . BACK SURGERY    . BRAIN SURGERY  1989   subdural hematoma  . CARDIAC CATHETERIZATION     Stent - 12  . CHOLECYSTECTOMY N/A 06/29/2017   Procedure: LAPAROSCOPIC CHOLECYSTECTOMY  WITH INTRAOPERATIVE CHOLANGIOGRAM, POSSIBLE OPEN;  Surgeon: Robert Bellow, MD;  Location: [facility] ORS;  Service: General;  Laterality: N/A;  . CORONARY ANGIOPLASTY WITH STENT PLACEMENT  03/2007   to RCA  . HERNIA REPAIR    . ILIAC ARTERY STENT  10/22/2012   "2" (10/22/2012)  . ILIAC ARTERY STENT  06/2012   left  . INCISIONAL HERNIA REPAIR N/A 11/08/2015   Procedure: INCISIONAL HERNIA REPAIR WITH MYOFASCIAL RELEASE;  Surgeon: Rolm Bookbinder, MD;  Location: [place];  Service: General;  Laterality: N/A;  . INSERTION OF MESH N/A 11/08/2015   Procedure: INSERTION OF MESH;   Surgeon: Rolm Bookbinder, MD;  Location: [place];  Service: General;  Laterality: N/A;  . LEFT HEART CATHETERIZATION WITH CORONARY ANGIOGRAM N/A 05/29/2012   Procedure: LEFT HEART CATHETERIZATION WITH CORONARY ANGIOGRAM;  Surgeon: Wellington Hampshire, MD;  Location: [place] CATH LAB;  Service: Cardiovascular;  Laterality: N/A;  . LIVER BIOPSY  10/2012  . LOWER EXTREMITY ANGIOGRAM N/A 05/29/2012   Procedure: LOWER EXTREMITY ANGIOGRAM;  Surgeon: Wellington Hampshire, MD;  Location: [place] CATH LAB;  Service: Cardiovascular;  Laterality: N/A;  . MICROLARYNGOSCOPY WITH CO2 LASER AND EXCISION OF VOCAL CORD LESION N/A 11/21/2017   Procedure: MICROLARYNGOSCOPY WITH EXCISION OF VOCAL CORD LESION;  Surgeon: Jerrell Belfast, MD;  Location: [place];  Service: ENT;  Laterality: N/A;  . PERCUTANEOUS STENT INTERVENTION Left 05/29/2012   Procedure: PERCUTANEOUS STENT INTERVENTION;  Surgeon: Wellington Hampshire, MD;  Location: [place] CATH LAB;  Service: Cardiovascular;  Laterality: Left;  . POSTERIOR FUSION CERVICAL SPINE  2012  . SUBDURAL HEMATOMA EVACUATION VIA CRANIOTOMY      SOCIAL HISTORY:   Social History   Tobacco Use  . Smoking status: Current Every Day Smoker    Packs/day: 1.00    Years: 40.00    Pack years: 40.00    Types: Cigarettes  . Smokeless tobacco: Never Used  . Tobacco comment: "ive slowed down recently"   Substance Use Topics  . Alcohol use: Yes    Alcohol/week: 3.6 oz    Types: 6 Cans of beer per week    FAMILY HISTORY:   Family History  Problem Relation Age of Onset  . Emphysema Father   . COPD Mother   . Diabetes Other   . Alcohol abuse Other   . Hypertension Other   . Hyperlipidemia Other   . Seizures Other     DRUG ALLERGIES:   Allergies  Allergen Reactions  . Lisinopril Other (See Comments)    Other reaction(s): Other (See Comments) Elevated creatine and very elevated potassium Elevated creatine and very elevated potassium Elevated creatine and very elevated potassium      REVIEW OF SYSTEMS:  Review of Systems  Constitutional: Negative for chills, fever and weight loss.  HENT: Negative for ear discharge, ear pain and nosebleeds.   Eyes: Negative for blurred vision, pain and discharge.  Respiratory: Negative for sputum production, shortness of breath, wheezing and stridor.   Cardiovascular: Positive for chest pain. Negative for palpitations, orthopnea and PND.  Gastrointestinal: Negative for abdominal pain, diarrhea, nausea and vomiting.  Genitourinary: Negative for frequency and urgency.  Musculoskeletal: Negative for back pain and joint pain.  Neurological: Negative for sensory change, speech change, focal weakness and weakness.  Psychiatric/Behavioral: Negative for depression and hallucinations. The patient is not nervous/anxious.      MEDICATIONS AT HOME:   Prior to Admission medications   Medication Sig Start Date End Date Taking? Authorizing Provider  acetaminophen (TYLENOL) 500  MG tablet Take 1-2 tabs every 6 hours prn pain. Do not exceed 7 tablets daily. 12/24/17   Eppie Gibson, MD  atorvastatin (LIPITOR) 20 MG tablet TAKE 1 TABLET (20 MG TOTAL) BY MOUTH DAILY. 12/03/17   Wellington Hampshire, MD  bisacodyl (DULCOLAX) 5 MG EC tablet Take 1-2 tablets PRN constipation if you go for more than 2 days without a bowel movement. Patient not taking: Reported on 02/05/2018 01/15/18   Eppie Gibson, MD  buPROPion (WELLBUTRIN XL) 150 MG 24 hr tablet Take 150 mg by mouth every morning. 05/20/17   [provider]  carvedilol (COREG) 6.25 MG tablet TAKE 1 TABLET (6.25 MG TOTAL) BY MOUTH 2 (TWO) TIMES DAILY WITH A MEAL. 03/04/18   Wellington Hampshire, MD  divalproex (DEPAKOTE ER) 500 MG 24 hr tablet Take 500 mg by mouth 2 (two) times daily.  06/17/17   [provider]  fentaNYL 37.5 MCG/HR PT72 Place 37.5 mcg onto the skin every 3 (three) days. Patient not taking: Reported on 02/05/2018 01/09/18   Eppie Gibson, MD  gabapentin (NEURONTIN) 300 MG capsule Take  1 capsule (300 mg total) by mouth 3 (three) times daily. For pain. Patient not taking: Reported on 02/05/2018 01/15/18   Eppie Gibson, MD  guaiFENesin-codeine [facility]) 100-10 MG/5ML syrup Swallow 10 mL PO q4-6h prn, take with food or nutritional shake. Patient not taking: Reported on 02/05/2018 01/24/18   Gery Pray, MD  lidocaine (XYLOCAINE) 2 % solution Patient: Swallow 19mL of diluted mixture 20-36min before meals and at bedtime, up to QID for sore throat. Patient not taking: Reported on 02/05/2018 12/24/17   Eppie Gibson, MD  lithium carbonate (ESKALITH) 450 MG CR tablet Take 450 mg by mouth 2 (two) times daily.    [provider]  losartan (COZAAR) 100 MG tablet Take 1 tablet (100 mg total) by mouth daily. 07/26/17 11/15/18  Wellington Hampshire, MD  nicotine (NICODERM CQ - DOSED IN MG/24 HOURS) 14 mg/24hr patch Place 1 patch (14 mg total) onto the skin daily. Apply 21 mg patch daily x 6 wk, then 14mg  patch daily x 2 wk, then 7 mg patch daily x 2 wk Patient not taking: Reported on 02/05/2018 12/11/17   Eppie Gibson, MD  nicotine (NICODERM CQ - DOSED IN MG/24 HOURS) 21 mg/24hr patch Place 1 patch (21 mg total) onto the skin daily. Apply 21 mg patch daily x 6 wk, then 14mg  patch daily x 2 wk, then 7 mg patch daily x 2 wk Patient not taking: Reported on 02/05/2018 12/11/17   Eppie Gibson, MD  nicotine (NICODERM CQ - DOSED IN MG/24 HR) 7 mg/24hr patch Place 1 patch (7 mg total) onto the skin daily. Apply 21 mg patch daily x 6 wk, then 14mg  patch daily x 2 wk, then 7 mg patch daily x 2 wk Patient not taking: Reported on 02/05/2018 12/11/17   Eppie Gibson, MD  oxyCODONE-acetaminophen (PERCOCET/ROXICET) 5-325 MG tablet Take 1-2 tablets by mouth every 6 (six) hours as needed for severe pain. Patient not taking: Reported on 02/05/2018 01/18/18   Bruning, Ashlyn, PA-C  pantoprazole (PROTONIX) 40 MG tablet Take 40 mg by mouth daily at 2 PM. In the afternoon. 06/10/17   [provider]  prochlorperazine  (COMPAZINE) 10 MG tablet Take 1 tablet (10 mg total) by mouth every 6 (six) hours as needed for nausea or vomiting. Patient not taking: Reported on 02/05/2018 01/03/18   Gery Pray, MD  senna (SENOKOT) 8.6 MG TABS tablet Take 1-2  tablets (8.6-17.2 mg total) by mouth at bedtime as needed for mild constipation. Patient not taking: Reported on 02/05/2018 12/31/17   Eppie Gibson, MD      VITAL SIGNS:  Blood pressure 121/76, pulse 85, temperature 98 F (36.7 C), temperature source Oral, height 5\' 7"  (1.702 m), weight 63.5 kg (139 lb 15.9 oz), SpO2 95 %.  PHYSICAL EXAMINATION:  GENERAL:  57 y.o.-year-old patient lying in the bed with no acute distress.  EYES: Pupils equal, round, reactive to light and accommodation. No scleral icterus. Extraocular muscles intact.  HEENT: Head atraumatic, normocephalic. Oropharynx and nasopharynx clear.  NECK:  Supple, no jugular venous distention. No thyroid enlargement, no tenderness.  LUNGS: Normal breath sounds bilaterally, no wheezing, rales,rhonchi or crepitation. No use of accessory muscles of respiration.  CARDIOVASCULAR: S1, S2 normal. No murmurs, rubs, or gallops.  ABDOMEN: Soft, nontender, nondistended. Bowel sounds present. No organomegaly or mass.  EXTREMITIES: No pedal edema, cyanosis, or clubbing.  NEUROLOGIC: Cranial nerves II through XII are intact. Muscle strength 5/5 in all extremities. Sensation intact. Gait not checked.  PSYCHIATRIC: The patient is alert and oriented x 3.  SKIN: No obvious rash, lesion, or ulcer.   LABORATORY PANEL:   CBC No results for input(s): WBC, HGB, HCT, PLT in the last 168 hours. ------------------------------------------------------------------------------------------------------------------  Chemistries  No results for input(s): NA, K, CL, CO2, GLUCOSE, BUN, CREATININE, CALCIUM, MG, AST, ALT, ALKPHOS, BILITOT in the last 168 hours.  Invalid input(s):  GFRCGP ------------------------------------------------------------------------------------------------------------------  Cardiac Enzymes No results for input(s): TROPONINI in the last 168 hours. ------------------------------------------------------------------------------------------------------------------  RADIOLOGY:  No results found.  EKG:  STT elevation in inferior leads  IMPRESSION AND PLAN:   [name]  is a 57 y.o. male with a known history of coronary artery disease status post stent in the past, bipolar disorder, ongoing tobacco abuse, hyperlipidemia comes to the emergency room brought in by EMS after patient started having severe chest pain. EKG and route showed acute inferior wall MI.  1. acute inferior wall myocardial infarction  -patient is status post cardiac cath  Mid RCA to Dist RCA lesion is 100% stenosed.  A drug-eluting stent was successfully placed using a STENT RESOLUTE [id number].  Post intervention, there is a 0% residual stenosis.  Ost Cx to Prox Cx lesion is 100% stenosed.  A stent was successfully placed.  -Continue aspirin, Brilinta -will resume home meds once medication reconciliation is done by pharmacy tech  2. Coronary artery disease -continue cardiac meds  3. History of bipolar disorder -cont psych meds  4. Tobacco abuse counselors smoking cessation. More than four minutes spent. Patient does not appear to be motivated.    All the records are reviewed and case discussed with ED provider. Management plans discussed with the patient, family and they are in agreement.  CODE STATUS: full  TOTAL critical TIME TAKING CARE OF THIS PATIENT: *50* minutes.    Fritzi Mandes M.D on 03/07/2018 at 4:12 PM  Between 7am to 6pm - Pager - [phone]  After 6pm go to [email or web address] - password [email or web address] [facility]  [facility]  Office  [phone]  CC: Primary care physician; Gayland Curry, MD

## 2018-03-08 DIAGNOSIS — I2119 ST elevation (STEMI) myocardial infarction involving other coronary artery of inferior wall: Principal | ICD-10-CM

## 2018-03-08 LAB — BASIC METABOLIC PANEL
Anion gap: 5 (ref 5–15)
BUN: 7 mg/dL (ref 6–20)
CO2: 27 mmol/L (ref 22–32)
Calcium: 9 mg/dL (ref 8.9–10.3)
Chloride: 107 mmol/L (ref 101–111)
Creatinine, Ser: 0.68 mg/dL (ref 0.61–1.24)
GFR calc Af Amer: >60 mL/min (ref >60)
GFR calc non Af Amer: >60 mL/min (ref >60)
Glucose, Bld: 164 mg/dL — ABNORMAL HIGH (ref 65–99)
Potassium: 3.4 mmol/L — ABNORMAL LOW (ref 3.5–5.1)
Sodium: 139 mmol/L (ref 135–145)

## 2018-03-08 LAB — CBC
HCT: 39.1 % — ABNORMAL LOW (ref 40.0–52.0)
Hemoglobin: 13.2 g/dL (ref 13.0–18.0)
MCH: 31.6 pg (ref 26.0–34.0)
MCHC: 33.7 g/dL (ref 32.0–36.0)
MCV: 93.8 fL (ref 80.0–100.0)
Platelets: 141 10*3/uL — ABNORMAL LOW (ref 150–440)
RBC: 4.17 MIL/uL — ABNORMAL LOW (ref 4.40–5.90)
RDW: 13.7 % (ref 11.5–14.5)
WBC: 7.3 10*3/uL (ref 3.8–10.6)

## 2018-03-08 LAB — TROPONIN I
Troponin I: 4.03 ng/mL (ref <0.03)
Troponin I: 3.99 ng/mL (ref <0.03)
Troponin I: 3.83 ng/mL (ref <0.03)

## 2018-03-08 LAB — GLUCOSE, CAPILLARY: Glucose-Capillary: 94 mg/dL (ref 65–99)

## 2018-03-08 MED ORDER — ENSURE ENLIVE PO LIQD
237.0000 mL | Freq: Two times a day (BID) | ORAL | Status: DC
  Administered 2018-03-08: 237 mL via ORAL

## 2018-03-08 MED ORDER — TICAGRELOR 90 MG PO TABS
90.0000 mg | ORAL_TABLET | Freq: Two times a day (BID) | ORAL | Status: DC
  Administered 2018-03-08 – 2018-03-09 (×2): 90 mg via ORAL
  Filled 2018-03-08: qty 1

## 2018-03-08 MED ORDER — CARVEDILOL 3.125 MG PO TABS
3.1250 mg | ORAL_TABLET | Freq: Two times a day (BID) | ORAL | Status: DC
  Administered 2018-03-08 – 2018-03-09 (×3): 3.125 mg via ORAL
  Filled 2018-03-08 (×3): qty 1

## 2018-03-08 MED ORDER — ADULT MULTIVITAMIN W/MINERALS CH
1.0000 | ORAL_TABLET | Freq: Every day | ORAL | Status: DC
  Administered 2018-03-08 – 2018-03-09 (×2): 1 via ORAL
  Filled 2018-03-08: qty 1

## 2018-03-08 MED ORDER — ATORVASTATIN CALCIUM 20 MG PO TABS
40.0000 mg | ORAL_TABLET | Freq: Every day | ORAL | Status: DC
  Administered 2018-03-08: 40 mg via ORAL
  Filled 2018-03-08: qty 2

## 2018-03-08 MED ORDER — LOSARTAN POTASSIUM 50 MG PO TABS
50.0000 mg | ORAL_TABLET | Freq: Every day | ORAL | Status: DC
  Administered 2018-03-08 – 2018-03-09 (×2): 50 mg via ORAL
  Filled 2018-03-08: qty 1
  Filled 2018-03-08: qty 2

## 2018-03-08 NOTE — Plan of Care (Signed)
Patient c/o chest pain. 12 lead ECG completed, no acute changes. PRN morphine ordered by cardiology.

## 2018-03-08 NOTE — Progress Notes (Signed)
Patient transferred from CCU. No complaints at this time Tele box on and verified.VSS

## 2018-03-08 NOTE — Progress Notes (Addendum)
Initial Nutrition Assessment  DOCUMENTATION CODES:   Non-severe (moderate) malnutrition in context of chronic illness  INTERVENTION:   Ensure Enlive po BID, each supplement provides 350 kcal and 20 grams of protein  MVI daily  Liberalize diet   NUTRITION DIAGNOSIS:   Moderate Malnutrition related to chronic illness, cancer and cancer related treatments as evidenced by moderate fat depletion, moderate muscle depletion.  GOAL:   Patient will meet greater than or equal to 90% of their needs  MONITOR:   PO intake, Supplement acceptance, Labs, Weight trends, I & O's  REASON FOR ASSESSMENT:   Consult, Malnutrition Screening Tool Diet education  ASSESSMENT:   57 y.o. male With PMHx has a past medical history of Adenomatous colon polyp, unspecified (2011), ADHD (attention deficit hyperactivity disorder) (2007), Bipolar disorder, unspecified, CAD (coronary artery disease) (1997, 1998), GERD (gastroesophageal reflux disease), H/O colonoscopy (2013), Hep C w/o coma, Hyperlipidemia, unspecified, Hypertension, carninoma of glottis, IBS (2014), Paranoid schizophrenia, and PVD admitted with NSTEMI   Met with pt in room today. Pt reports fair appetite and oral intake pta. 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 Per chart, pt has lost 17lbs(11%) in 8 months. Pt reports that he does drink chocolate Ensure at home. Pt currently eating 50% of his meals in hospital. RD will order supplements and liberalize diet. RD provided pt with heart healthy diet education today. Pt with low potassium today; recommend supplementation per MD.   Medications reviewed and include: aspirin  Labs reviewed: K 3.4(L)  NUTRITION - FOCUSED PHYSICAL EXAM:    Most Recent Value  Orbital Region  Mild depletion  Upper Arm Region  Moderate depletion  Thoracic and Lumbar Region  Moderate depletion  Buccal Region  Mild depletion  Temple Region   Moderate depletion  Clavicle Bone Region  Moderate depletion  Clavicle and Acromion Bone Region  Moderate depletion  Scapular Bone Region  Moderate depletion  Dorsal Hand  Moderate depletion  Patellar Region  Severe depletion  Anterior Thigh Region  Moderate depletion  Posterior Calf Region  Moderate depletion  Edema (RD Assessment)  None  Hair  Reviewed  Eyes  Reviewed  Mouth  Reviewed  Skin  Reviewed  Nails  Reviewed     Diet Order:  Diet regular Room service appropriate? Yes; Fluid consistency: Thin  EDUCATION NEEDS:   Education needs have been addressed  Skin: Reviewed RN Assessment  Last BM:  4/3  Height:   Ht Readings from Last 1 Encounters:  03/08/18 5' 7"  (1.702 m)    Weight:   Wt Readings from Last 1 Encounters:  03/08/18 143 lb 4.8 oz (65 kg)    Ideal Body Weight:  67.2 kg  BMI:  Body mass index is 22.44 kg/m.  Estimated Nutritional Needs:   Kcal:  1800-2100kcal/day   Protein:  83-97g/day   Fluid:  >1.7L/day   Koleen Distance MS, RD, LDN Pager #[phone] After Hours Pager: [phone]

## 2018-03-08 NOTE — Progress Notes (Signed)
Progress Note  Patient Name: [name] Date of Encounter: 03/08/2018  Primary Cardiologist: Kathlyn Sacramento, MD   Subjective    He presented yesterday with acute onset of chest pain and was found to have inferior ST elevation myocardial infarction.  Emergent cardiac catheterization done by Dr. Saralyn Pilar showed occluded mid RCA stent likely due to very late stent thrombosis with significant underlying restenosis.  The patient was not taking aspirin daily.  He underwent successful angioplasty and drug-eluting stent placement without complications.  He had minor chest pain overnight with no significant EKG changes.  He feels better this morning.  He He continues to smoke 1 pack/day.   Inpatient Medications    Scheduled Meds: . aspirin  81 mg Oral Daily  . atorvastatin  40 mg Oral q1800  . carvedilol  3.125 mg Oral BID WC  . losartan  50 mg Oral Daily  . sodium chloride flush  3 mL Intravenous Q12H  . ticagrelor  90 mg Oral BID   Continuous Infusions: . sodium chloride    . sodium chloride     PRN Meds: sodium chloride, acetaminophen, morphine injection, ondansetron (ZOFRAN) IV, sodium chloride flush   Vital Signs    Vitals:   03/08/18 0400 03/08/18 0500 03/08/18 0600 03/08/18 0700  BP: (!) 164/95 (!) 105/52 123/68 139/68  Pulse: 84 67 65 (!) 57  Resp: 17 18 17 11   Temp: 97.8 F (36.6 C)     TempSrc: Oral     SpO2: 97% 94% 95% 97%  Weight:      Height:        Intake/Output Summary (Last 24 hours) at 03/08/2018 0753 Last data filed at 03/08/2018 0500 Gross per 24 hour  Intake 535.17 ml  Output 650 ml  Net -114.83 ml   Filed Weights   03/07/18 1532  Weight: 139 lb 15.9 oz (63.5 kg)    Telemetry    Normal sinus rhythm with isolated PVCs.  - Personally Reviewed  ECG    Normal sinus rhythm with evolving inferior infarct - Personally Reviewed  Physical Exam   GEN: No acute distress.   Neck: No JVD Cardiac: RRR, no murmurs, rubs, or gallops.    Respiratory: Clear to auscultation bilaterally. GI: Soft, nontender, non-distended  MS: No edema; No deformity. Neuro:  Nonfocal  Psych: Normal affect  Right groin is intact with no hematoma.  Labs    Chemistry Recent Labs  Lab 03/07/18 1604 03/08/18 0523  NA 141 139  K 4.5 3.4*  CL 107 107  CO2 24 27  GLUCOSE 93 164*  BUN 7 7  CREATININE 0.65 0.68  CALCIUM 9.1 9.0  PROT 6.9  --   ALBUMIN 3.9  --   AST 23  --   ALT 22  --   ALKPHOS 55  --   BILITOT 0.8  --   GFRNONAA >60 >60  GFRAA >60 >60  ANIONGAP 10 5     Hematology Recent Labs  Lab 03/07/18 1604 03/08/18 0523  WBC 7.3 7.3  RBC 4.36* 4.17*  HGB 13.8 13.2  HCT 41.4 39.1*  MCV 94.9 93.8  MCH 31.7 31.6  MCHC 33.3 33.7  RDW 14.0 13.7  PLT 159 141*    Cardiac Enzymes Recent Labs  Lab 03/07/18 1604 03/08/18 0019 03/08/18 0523  TROPONINI 0.60* 4.03* 3.99*   No results for input(s): TROPIPOC in the last 168 hours.   BNPNo results for input(s): BNP, PROBNP in the last 168 hours.   DDimer  No results for input(s): DDIMER in the last 168 hours.   Radiology    No results found.  Cardiac Studies   Cardiac cath:   Mid RCA to Dist RCA lesion is 100% stenosed.  A drug-eluting stent was successfully placed using a STENT RESOLUTE [id number].  Post intervention, there is a 0% residual stenosis.  Ost Cx to Prox Cx lesion is 100% stenosed.  A stent was successfully placed.  Post intervention, there is a 0% residual stenosis.   1.  Inferior ST elevation myocardial infarction, with in-stent thrombosis mid RCA 2.  Chronically occluded proximal left circumflex 3.  Mildly reduced left ventricular function, with estimated LVEF 40-45% with posterior basal akinesis 4.  Successful PCI, with DES mid RCA  Patient Profile     57 y.o. male  with history of coronary artery disease, stent to his RCA in April 2008 with occluded circumflex, previous bilateral iliac stenting and most recently aortobifemoral  bypass in December 2015 , mild to moderate renal artery stenosis on the left, history of hepatitis C, long alcohol history currently in remission, long history of smoking , hypertension, bipolar disorder and hyperlipidemia .  Most recent cardiac catheterization in July 2014 showed patent RCA stent with chronically occluded left circumflex artery. Ejection fraction was 40%.  He has been treated recently for laryngeal cancer and just finished radiation therapy.  He presented with inferior ST elevation myocardial infarction due to very late stent thrombosis.  He was not taking aspirin.  Assessment & Plan    1.  Inferior ST elevation myocardial infarction: He had successful angioplasty and drug-eluting stent placement to the mid right coronary artery.  Recommend continuing dual antiplatelet therapy with aspirin and Brilinta for 1 year.  He should continue take aspirin indefinitely after that and we might have to consider indefinite treatment with dual antiplatelet therapy as tolerated.  Transfer to telemetry today.  Possible discharge home tomorrow.   2.  Chronic systolic heart failure: Due to ischemic cardiomyopathy: I resume small dose carvedilol and Losartan. EF was 40-45%. He appears to be euvolemic.  3.  Hyperlipidemia: I resumed atorvastatin. 4.  Tobacco use: I discussed the importance of smoking cessation.  For questions or updates, please contact [place] Please consult [email or web address] for contact info under Cardiology/STEMI.      Signed, Kathlyn Sacramento, MD  03/08/2018, 7:53 AM

## 2018-03-08 NOTE — Care Management (Signed)
CM partner looked online and found a current list of preferred medications from the [facility] Medicaid Division website ([email or web address]  Brilinta does appear on their preferred list.

## 2018-03-08 NOTE — Progress Notes (Signed)
[facility] NAME: [name]    MR#:  [id number]  DATE OF BIRTH:  Jul 12, 1961  SUBJECTIVE: Patient is seen at bedside, admitted yesterday for STEMI, patient had stent in mid RCA.  Post procedure he is doing good this morning without any chest pain.  CHIEF COMPLAINT:   Chief Complaint  Patient presents with  . Code STEMI    REVIEW OF SYSTEMS:    Review of Systems  Constitutional: Negative for chills and fever.  HENT: Negative for hearing loss.   Eyes: Negative for blurred vision, double vision and photophobia.  Respiratory: Negative for cough, hemoptysis and shortness of breath.   Cardiovascular: Negative for palpitations, orthopnea and leg swelling.  Gastrointestinal: Negative for abdominal pain, diarrhea and vomiting.  Genitourinary: Negative for dysuria and urgency.  Musculoskeletal: Negative for myalgias and neck pain.  Skin: Negative for rash.  Neurological: Negative for dizziness, focal weakness, seizures, weakness and headaches.  Psychiatric/Behavioral: Negative for memory loss. The patient does not have insomnia.     Nutrition:  Tolerating Diet: Tolerating PT:      DRUG ALLERGIES:   Allergies  Allergen Reactions  . Lisinopril Other (See Comments)    Other reaction(s): Other (See Comments) Elevated creatine and very elevated potassium Elevated creatine and very elevated potassium Elevated creatine and very elevated potassium     VITALS:  Blood pressure 126/69, pulse (!) 59, temperature 97.7 F (36.5 C), resp. rate 18, height 5\' 7"  (1.702 m), weight 65 kg (143 lb 4.8 oz), SpO2 97 %.  PHYSICAL EXAMINATION:   Physical Exam  GENERAL:  57 y.o.-year-old patient lying in the bed with no acute distress.  EYES: Pupils equal, round, reactive to light and accommodation. No scleral icterus. Extraocular muscles intact.  HEENT: Head atraumatic, normocephalic. Oropharynx and nasopharynx clear.  NECK:   Supple, no jugular venous distention. No thyroid enlargement, no tenderness.  LUNGS: Normal breath sounds bilaterally, no wheezing, rales,rhonchi or crepitation. No use of accessory muscles of respiration.  CARDIOVASCULAR: S1, S2 normal. No murmurs, rubs, or gallops.  ABDOMEN: Soft, nontender, nondistended. Bowel sounds present. No organomegaly or mass.  EXTREMITIES: No pedal edema, cyanosis, or clubbing.  NEUROLOGIC: Cranial nerves II through XII are intact. Muscle strength 5/5 in all extremities. Sensation intact. Gait not checked.  PSYCHIATRIC: The patient is alert and oriented x 3.  SKIN: No obvious rash, lesion, or ulcer.    LABORATORY PANEL:   CBC Recent Labs  Lab 03/08/18 0523  WBC 7.3  HGB 13.2  HCT 39.1*  PLT 141*   ------------------------------------------------------------------------------------------------------------------  Chemistries  Recent Labs  Lab 03/07/18 1604 03/08/18 0523  NA 141 139  K 4.5 3.4*  CL 107 107  CO2 24 27  GLUCOSE 93 164*  BUN 7 7  CREATININE 0.65 0.68  CALCIUM 9.1 9.0  AST 23  --   ALT 22  --   ALKPHOS 55  --   BILITOT 0.8  --    ------------------------------------------------------------------------------------------------------------------  Cardiac Enzymes Recent Labs  Lab 03/08/18 0958  TROPONINI 3.83*   ------------------------------------------------------------------------------------------------------------------  RADIOLOGY:  No results found.   ASSESSMENT AND PLAN:   Active Problems:   STEMI involving oth coronary artery of inferior wall (HCC)   #1 inferior valgus ST elevation MI, status post successful angioplasty and drug-eluting stent in mid RCA, patient will be on aspirin, Brilinta at least for 1 year after that patient to to take aspirin indefinitely.  Likely discharge home tomorrow as per  cardiology note.  Refer the patient to cardiac rehab.  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  Patient to continue all his home medicines. 3.  Hyperlipidemia: Continue statins 4.  Continued tobacco abuse and patient smokes about 1-1 pack of cigarettes a day, counseled the patient to quit. 5.  History of bipolar disorder,  6 history of laryngeal cancer status post microlaryngoscopy with biopsy, patient has, cell carcinoma and received radiation from January 14 - February 21.  And is doing well as peENT note  All the records are reviewed and case discussed with Care Management/Social Workerr. Management plans discussed with the patient, family and they are in agreement.  CODE STATUS:full code  TOTAL TIME TAKING CARE OF THIS PATIENT: 35 minutes.   POSSIBLE D/C IN 1-2dAYS, DEPENDING ON CLINICAL CONDITION.   Epifanio Lesches M.D on 03/08/2018 at 11:24 AM  Between 7am to 6pm - Pager - [phone]  After 6pm go to [email or web address] - password [email or web address] [facility]  [facility]  Office  [phone]  CC: Primary care physician; Gayland Curry, MD

## 2018-03-08 NOTE — Progress Notes (Addendum)
57 year old male who presented to the hospital with chest pain.  Patient was found to have inferior STEMI.  Patient underwent an emergent catheterization performed by Dr. Saralyn Pilar.  Patient s/p stents to mid RCA and ostial to proximal circumflex.  PMH includes chronic systolic heart failure secondary to ischemic CM - EF 40-45%, HLD, and tobacco abuse, bipolar disorder, laryngeal cancer s/p radiation treatments.    "Heart Attack Bouncing Back" booklet given and reviewed with patient. Discussed the definition of CAD. Reviewed the location of CAD and where stents were placed.?Informed patient he will be given a stent card. Explained the purpose of the stent card. Instructed patient to keep stent card in his wallet.  ? Discussed modifiable risk factors including controlling blood pressure, cholesterol, and blood sugar; following heart healthy diet; maintaining healthy weight; exercise; and smoking cessation, if applicable. ?Note: Patient is a current every day smoker.??? ? Discussed cardiac medications including rationale for taking, mechanisms of action, and side effects. Stressed the importance of taking medications as prescribed.  ? Discussed emergency plan for heart attack symptoms. Patient verbalized understanding of need to call 911 and not to drive himself or have his family drive him to the ER if having cardiac symptoms / chest pain.  ? Heart healthy diet of low sodium, low fat, low cholesterol heart healthy diet discussed. Information on diet provided. Dietitian Consultation pending for education on heart healthy diet.    Smoking Cessation - Patient is a current every day smoker. Patient reports smoking a pack per day. ?"Thinking About Quitting - Yes You Can!" informational sheet reviewed with patient. Patient reports he has never tried to quit and he has no desire to quit smoking.     Exercise - Benefits of exercised discussed. Informed patient cardiologist has referred him to outpatient Cardiac  Rehab. An overview of the program was provided. Patient reports he has heard of Cardiac Rehab before, but he does not wish to participate.    Roanna Epley, RN, BSN, [facility]? [facility]  Cardiovascular &?Pulmonary Nurse Navigator  Direct Line: [phone]  Department Phone #: [phone] Fax: [phone]? Email Address: [email or web address] ?     Marland Kitchen

## 2018-03-08 NOTE — Significant Event (Signed)
Per Dr. Fletcher Anon, pt is being transferred to floor with telemetry. Report given to Casey Burkitt via phone. Pt transferred to 231 via wheelchair with belongings.

## 2018-03-09 DIAGNOSIS — E440 Moderate protein-calorie malnutrition: Secondary | ICD-10-CM

## 2018-03-09 MED ORDER — ENSURE ENLIVE PO LIQD: 237.0000 mL | Freq: Two times a day (BID) | ORAL | 12 refills | Status: DC

## 2018-03-09 MED ORDER — TICAGRELOR 90 MG PO TABS
90.0000 mg | ORAL_TABLET | Freq: Two times a day (BID) | ORAL | 1 refills | Status: DC
Start: 2018-03-09 — End: 2018-03-14

## 2018-03-09 MED ORDER — CARVEDILOL 3.125 MG PO TABS: 3.1250 mg | ORAL_TABLET | Freq: Two times a day (BID) | ORAL | 1 refills | Status: DC

## 2018-03-09 MED ORDER — LOSARTAN POTASSIUM 50 MG PO TABS: 50.0000 mg | ORAL_TABLET | Freq: Every day | ORAL | 1 refills | Status: DC

## 2018-03-09 MED ORDER — ATORVASTATIN CALCIUM 40 MG PO TABS: 40.0000 mg | ORAL_TABLET | Freq: Every day | ORAL | 1 refills | Status: DC

## 2018-03-09 MED ORDER — ASPIRIN 81 MG PO CHEW: 81.0000 mg | CHEWABLE_TABLET | Freq: Every day | ORAL | 1 refills | Status: DC

## 2018-03-09 NOTE — Progress Notes (Signed)
Progress Note  Patient Name: [name] Date of Encounter: 03/09/2018  Primary Cardiologist: Kathlyn Sacramento, MD   Subjective   No chest pain or shortness of breath.  The patient feels well.  He is eager to go home today.  Inpatient Medications    Scheduled Meds: . aspirin  81 mg Oral Daily  . atorvastatin  40 mg Oral q1800  . carvedilol  3.125 mg Oral BID WC  . feeding supplement (ENSURE ENLIVE)  237 mL Oral BID BM  . losartan  50 mg Oral Daily  . multivitamin with minerals  1 tablet Oral Daily  . sodium chloride flush  3 mL Intravenous Q12H  . ticagrelor  90 mg Oral BID   Continuous Infusions: . sodium chloride    . sodium chloride     PRN Meds: sodium chloride, acetaminophen, morphine injection, ondansetron (ZOFRAN) IV, sodium chloride flush   Vital Signs    Vitals:   03/08/18 1047 03/08/18 1649 03/08/18 2049 03/09/18 0429  BP: 126/69 138/77 124/67 112/73  Pulse: (!) 59 65 71 74  Resp: 18 18    Temp: 97.7 F (36.5 C) 97.9 F (36.6 C) 98.9 F (37.2 C) 98.4 F (36.9 C)  TempSrc:  Oral Oral Oral  SpO2: 97% 100% 98% 99%  Weight: 143 lb 4.8 oz (65 kg)     Height: 5\' 7"  (1.702 m)       Intake/Output Summary (Last 24 hours) at 03/09/2018 0818 Last data filed at 03/09/2018 0431 Gross per 24 hour  Intake 200 ml  Output 1325 ml  Net -1125 ml   Filed Weights   03/07/18 1532 03/08/18 1047  Weight: 139 lb 15.9 oz (63.5 kg) 143 lb 4.8 oz (65 kg)    Telemetry    Normal sinus rhythm without significant arrhythmia- Personally Reviewed   Physical Exam  Alert, oriented male in no distress.  Somewhat flat affect. GEN: No acute distress.   Neck: No JVD Cardiac: RRR, no murmurs, rubs, or gallops.  Respiratory: Clear to auscultation bilaterally. GI: Soft, nontender, non-distended  MS: No edema; No deformity.  There is mild tenderness at the right groin site with no evidence of hematoma or ecchymoses. Neuro:  Nonfocal  Psych: Normal affect   Labs      Chemistry Recent Labs  Lab 03/07/18 1604 03/08/18 0523  NA 141 139  K 4.5 3.4*  CL 107 107  CO2 24 27  GLUCOSE 93 164*  BUN 7 7  CREATININE 0.65 0.68  CALCIUM 9.1 9.0  PROT 6.9  --   ALBUMIN 3.9  --   AST 23  --   ALT 22  --   ALKPHOS 55  --   BILITOT 0.8  --   GFRNONAA >60 >60  GFRAA >60 >60  ANIONGAP 10 5     Hematology Recent Labs  Lab 03/07/18 1604 03/08/18 0523  WBC 7.3 7.3  RBC 4.36* 4.17*  HGB 13.8 13.2  HCT 41.4 39.1*  MCV 94.9 93.8  MCH 31.7 31.6  MCHC 33.3 33.7  RDW 14.0 13.7  PLT 159 141*    Cardiac Enzymes Recent Labs  Lab 03/07/18 1604 03/08/18 0019 03/08/18 0523 03/08/18 0958  TROPONINI 0.60* 4.03* 3.99* 3.83*   No results for input(s): TROPIPOC in the last 168 hours.   BNPNo results for input(s): BNP, PROBNP in the last 168 hours.   DDimer No results for input(s): DDIMER in the last 168 hours.   Radiology    No results found.  Cardiac Studies  Cardiac Cath 03/07/2018: Conclusion     Mid RCA to Dist RCA lesion is 100% stenosed.  A drug-eluting stent was successfully placed using a STENT RESOLUTE [id number].  Post intervention, there is a 0% residual stenosis.  Ost Cx to Prox Cx lesion is 100% stenosed.  A stent was successfully placed.  Post intervention, there is a 0% residual stenosis.   1.  Inferior ST elevation myocardial infarction, with in-stent thrombosis mid RCA 2.  Chronically occluded proximal left circumflex 3.  Mildly reduced left ventricular function, with estimated LVEF 40-45% with posterior basal akinesis 4.  Successful PCI, with DES mid RCA   Procedural Details/Technique   Technical Details The right groin was prepped and draped in usual sterile manner. A 6 French sheath was introduced right femoral artery. The right coronary artery was cannulated with a JR4 sidehole guiding catheter. Occluded mid RCA was crossed with a Pro-water wire. Predilatation was performed with a 2.5 x 15 mm Trek balloon  dilatation catheter. Multiple inflations were performed. A 3.0 x 38 mm Resolute Onyx stent was deployed. Postdilatation was performed with a 3.25 x 20 mm [place] Euphoria balloon dilatation catheter.  Angiography of the left coronary artery was performed with a diagnostic JL4 catheter. Left ventriculography was performed with a pigtail catheter. The right femoral artery was closed with a Mynx closure device. The patient received 324 mg aspirin, 180 mg of Brilinta, and Angiomax for anticoagulation. The patient received 1 mg of Versed, 50 mcg of fentanyl. Total contrast was 160 cc. There were no periprocedural complications.   Estimated blood loss <50 mL.  During this procedure the patient was administered the following to achieve and maintain moderate conscious sedation: Versed mg, while the patient's heart rate, blood pressure, and oxygen saturation were continuously monitored.  Coronary Findings   Diagnostic  Dominance: Right  Left Circumflex  Ost Cx to Prox Cx lesion 100% stenosed  Ost Cx to Prox Cx lesion is 100% stenosed.  Right Coronary Artery  Mid RCA to Dist RCA lesion 100% stenosed  Mid RCA to Dist RCA lesion is 100% stenosed. The lesion was previously treated.  Intervention   Ost Cx to Prox Cx lesion  Stent  A stent was successfully placed.  Post-Intervention Lesion Assessment  The intervention was successful. Pre-interventional TIMI flow is 0. Post-intervention TIMI flow is 3. Pressure gradient/FFR was not measured. Marland KitchenUltrasound (IVUS) was not performed on the lesion.  There is no residual stenosis post intervention.  Mid RCA to Dist RCA lesion  Stent  Lesion crossed with guidewire. Pre-stent angioplasty was performed using a BALLOON TREK [id number]. A drug-eluting stent was successfully placed using a STENT RESOLUTE [id number]. Stent strut is well apposed. Stent overlaps previously placed stent. Post-stent angioplasty was performed using a BALLOON [place] EUPHORA [id number].    Post-Intervention Lesion Assessment  The intervention was successful. Pre-interventional TIMI flow is 0. Post-intervention TIMI flow is 3. Pressure gradient/FFR was not measured. Marland KitchenUltrasound (IVUS) was not performed on the lesion.  There is no residual stenosis post intervention.  Wall Motion      EF 40-45%          Coronary Diagrams   Diagnostic Diagram       Post-Intervention Diagram          Patient Profile     57 y.o. male with history of coronary artery disease, stent to his RCA in April 2008 with occluded circumflex, previous bilateral iliac stenting and most recently aortobifemoral bypass inDecember 2015 ,  mild to moderate renal artery stenosis on the left, history of hepatitis C, long alcohol history currently in remission, long history of smoking , hypertension, bipolar disorder and hyperlipidemia .  Most recent cardiac catheterization in July 2014 showed patent RCA stent with chronically occluded left circumflex artery. Ejection fraction was 40%.  He has been treated recently for laryngeal cancer and just finished radiation therapy.  He presented with inferior ST elevation myocardial infarction due to very late stent thrombosis.  He was not taking aspirin.  Assessment & Plan    1.  Acute inferior STEMI involving the RCA: Patient underwent primary PCI with a drug-eluting stent.  He also underwent stenting of the left circumflex.  He should continue dual antiplatelet therapy with aspirin and ticagrelor for at least one year without interruption.  His troponin peaked at less than 4 mg/dL.  LV function is mildly reduced with an LVEF of 40-45%.  He is treated with carvedilol and losartan.  Blood pressure is well controlled.  I spoke to him at length about the  importance of medication adherence with dual antiplatelet therapy specifically. 2.  Hyperlipidemia: Patient is treated with a high intensity statin drug using atorvastatin 40 mg 3.  Tobacco abuse: Cessation counseling  is done.  Disposition: The patient appears stable for hospital discharge.  Will make sure that he has a transition of care visit with Dr. Fletcher Anon or his APP.  For questions or updates, please contact [place] Please consult [email or web address] for contact info under Cardiology/STEMI.      Signed, Sherren Mocha, MD  03/09/2018, 8:18 AM

## 2018-03-09 NOTE — Plan of Care (Signed)
Instructions re care of site, follow up, med changes

## 2018-03-09 NOTE — Progress Notes (Signed)
Discharged to home with a friend.  Extensive instructions about taking Brillinta, and possible risks.  Care instructions for right groin site.

## 2018-03-10 NOTE — Discharge Summary (Signed)
[name], 57 y.o., DOB [birth year], MRN [id number]. Admission date: 03/07/2018 Discharge Date 03/09/2018 Primary MD Gayland Curry, MD Admitting Physician Isaias Cowman, MD  Admission Diagnosis   1.  Acute inferior wall myocardial infarction 2.  Coronary artery disease 3 bipolar disorder  4.hyperlipidemia. 5.  Tobacco abuse  Discharge Diagnosis       1.  Coronary artery disease 2.  Status post cardiac intervention with stent placement in circumflex and right coronary artery 3.  Tobacco abuse 4.  Hyperlipidemia  Hospital Course   57 year old male patient with history of coronary artery disease, hyperlipidemia, tobacco abuse was admitted on 03/07/2018 for acute ST elevation myocardial infarction.  Patient was admitted to ICU with acute inferior wall MI.  He had cardiac cath and stent placed in the right coronary artery and circumflex artery.  Later on patient moved to the telemetry floor. patient was started on aspirin and Brilinta and continued statin medication .  He also continued beta-blocker.  Patient tolerated the procedure well.  No chest pain after procedure.  Tobacco cessation counseled to the patient cardiology cleared the patient.. Patient will be discharged home on oral aspirin and Brilinta and he follow-up with cardiology in the office follow-up with primary care physician in the clinic in 1 week.  [place] MD Significant Tests:  See full reports for all details    No results found.  Today   Subjective:   [name] 57 year old male patient in no acute distress Patient seen and evaluated by me on 03/09/2018  Objective:   Blood pressure 122/73, pulse 63, temperature (!) 97.4 F (36.3 C), temperature source Oral, resp. rate 14, height 5\' 7"  (1.702 m), weight 65 kg (143 lb 4.8 oz), SpO2 98 %.  . No intake or output data in the 24 hours ending 03/10/18 1432  Exam VITAL  SIGNS: Blood pressure 122/73, pulse 63, temperature (!) 97.4 F (36.3 C), temperature source Oral, resp. rate 14, height 5\' 7"  (1.702 m), weight 65 kg (143 lb 4.8 oz), SpO2 98 %.  GENERAL:  58 y.o.-year-old patient lying in the bed with no acute distress.  EYES: Pupils equal, round, reactive to light and accommodation. No scleral icterus. Extraocular muscles intact.  HEENT: Head atraumatic, normocephalic. Oropharynx and nasopharynx clear.  NECK:  Supple, no jugular venous distention. No thyroid enlargement, no tenderness.  LUNGS: Normal breath sounds bilaterally, no wheezing, rales,rhonchi or crepitation. No use of accessory muscles of respiration.  CARDIOVASCULAR: S1, S2 normal. No murmurs, rubs, or gallops.  ABDOMEN: Soft, nontender, nondistended. Bowel sounds present. No organomegaly or mass.  EXTREMITIES: No pedal edema, cyanosis, or clubbing.  NEUROLOGIC: Cranial nerves II through XII are intact. Muscle strength 5/5 in all extremities. Sensation intact. Gait not checked.  PSYCHIATRIC: The patient is alert and oriented x 3.  SKIN: No obvious rash, lesion, or ulcer.   Data Review     CBC w Diff:  Lab Results  Component Value Date   WBC 7.3 03/08/2018   HGB 13.2 03/08/2018   HGB 14.9 05/01/2014   HCT 39.1 (L) 03/08/2018   HCT 44.5 05/01/2014   PLT 141 (L) 03/08/2018   PLT 173 05/01/2014   LYMPHOPCT 8 03/07/2018   LYMPHOPCT 25.9 05/01/2014   MONOPCT 6 03/07/2018   MONOPCT 9.2 05/01/2014   EOSPCT 1 03/07/2018   EOSPCT 4.2 05/01/2014   BASOPCT 0 03/07/2018   BASOPCT 1.4 05/01/2014   CMP:  Lab  Results  Component Value Date   NA 139 03/08/2018   NA 143 09/22/2016   NA 139 05/01/2014   K 3.4 (L) 03/08/2018   K 4.2 05/01/2014   CL 107 03/08/2018   CL 108 (H) 05/01/2014   CO2 27 03/08/2018   CO2 27 05/01/2014   BUN 7 03/08/2018   BUN 4 (L) 09/22/2016   BUN 9 05/01/2014   CREATININE 0.68 03/08/2018   CREATININE 0.90 01/23/2018   CREATININE 1.05 05/01/2014   PROT 6.9  03/07/2018   PROT 7.6 05/01/2014   ALBUMIN 3.9 03/07/2018   ALBUMIN 3.6 05/01/2014   BILITOT 0.8 03/07/2018   BILITOT 1.2 (H) 05/01/2014   ALKPHOS 55 03/07/2018   ALKPHOS 62 05/01/2014   AST 23 03/07/2018   AST 38 (H) 05/01/2014   ALT 22 03/07/2018   ALT 67 05/01/2014  .  Micro Results Recent Results (from the past 240 hour(s))  MRSA PCR Screening     Status: None   Collection Time: 03/07/18  3:37 PM  Result Value Ref Range Status   MRSA by PCR NEGATIVE NEGATIVE Final    Comment:        The GeneXpert MRSA Assay (FDA approved for NASAL specimens only), is one component of a comprehensive MRSA colonization surveillance program. It is not intended to diagnose MRSA infection nor to guide or monitor treatment for MRSA infections. Performed at [facility], [address] [place], [place] [address]      Code Status History    Date Active Date Inactive Code Status Order ID Comments User Context   03/07/2018 1530 03/09/2018 1728 Full Code [id number]  Isaias Cowman, MD Inpatient   06/29/2017 1803 06/30/2017 1534 Full Code [id number]  Robert Bellow, MD Inpatient   01/26/2017 1411 01/27/2017 1305 Full Code [id number]  Ashok Pall, MD Inpatient   11/08/2015 1820 11/14/2015 1352 Full Code [id number]  Rolm Bookbinder, MD Inpatient   11/20/2014 1354 12/01/2014 1552 Full Code [id number]  Gabriel Earing, PA-C Inpatient   11/11/2014 1534 11/11/2014 2259 Full Code [id number]  Wellington Hampshire, MD Inpatient          Follow-up Information    Gayland Curry, MD Follow up in 1 week(s).   Specialty:  Family Medicine Contact information: [place] [address] [phone]        Wellington Hampshire, MD Follow up in 2 week(s).   Specialty:  Cardiology Contact information: [place] [address] [phone]           Discharge Medications   Allergies as of 03/09/2018      Reactions   Lisinopril Other (See  Comments)   Other reaction(s): Other (See Comments) Elevated creatine and very elevated potassium Elevated creatine and very elevated potassium Elevated creatine and very elevated potassium      Medication List    STOP taking these medications   bisacodyl 5 MG EC tablet Commonly known as:  DULCOLAX   buPROPion 150 MG 24 hr tablet Commonly known as:  WELLBUTRIN XL   fentaNYL 37.5 MCG/HR Pt72   guaiFENesin-codeine 100-10 MG/5ML syrup Commonly known as:  ROBITUSSIN AC   lidocaine 2 % solution Commonly known as:  XYLOCAINE   nicotine 14 mg/24hr patch Commonly known as:  NICODERM CQ - dosed in mg/24 hours   nicotine 21 mg/24hr patch Commonly known as:  NICODERM CQ - dosed in mg/24 hours   nicotine 7 mg/24hr patch Commonly known as:  NICODERM  CQ - dosed in mg/24 hr   oxyCODONE-acetaminophen 5-325 MG tablet Commonly known as:  PERCOCET/ROXICET   prochlorperazine 10 MG tablet Commonly known as:  COMPAZINE   senna 8.6 MG Tabs tablet Commonly known as:  SENOKOT     TAKE these medications   acetaminophen 500 MG tablet Commonly known as:  TYLENOL Take 1-2 tabs every 6 hours prn pain. Do not exceed 7 tablets daily.   aspirin 81 MG chewable tablet Chew 1 tablet (81 mg total) by mouth daily.   atorvastatin 40 MG tablet Commonly known as:  LIPITOR Take 1 tablet (40 mg total) by mouth daily at 6 PM. What changed:    medication strength  how much to take  when to take this   carvedilol 3.125 MG tablet Commonly known as:  COREG Take 1 tablet (3.125 mg total) by mouth 2 (two) times daily with a meal. What changed:    medication strength  how much to take   divalproex 500 MG 24 hr tablet Commonly known as:  DEPAKOTE ER Take 1,500 mg by mouth daily. Notes to patient:  None given today, take a dose when you get home.   feeding supplement (ENSURE ENLIVE) Liqd Take 237 mLs by mouth 2 (two) times daily between meals.   gabapentin 300 MG capsule Commonly known as:   NEURONTIN Take 1 capsule (300 mg total) by mouth 3 (three) times daily. For pain. Notes to patient:  None given today, take a dose when you get home.   lithium carbonate 450 MG CR tablet Commonly known as:  ESKALITH Take 450 mg by mouth 2 (two) times daily. Notes to patient:  None given today, take a dose when you get home.   losartan 50 MG tablet Commonly known as:  COZAAR Take 1 tablet (50 mg total) by mouth daily. What changed:    medication strength  how much to take   pantoprazole 40 MG tablet Commonly known as:  PROTONIX Take 40 mg by mouth daily at 2 PM. In the afternoon.   ticagrelor 90 MG Tabs tablet Commonly known as:  BRILINTA Take 1 tablet (90 mg total) by mouth 2 (two) times daily.          Total Time in preparing paper work, data evaluation and todays exam - 35 minutes  Saundra Shelling M.D on 03/10/2018 at 2:32 PM [place]  [phone]

## 2018-03-11 ENCOUNTER — Telehealth: Payer: Self-pay | Admitting: Cardiovascular Disease

## 2018-03-11 NOTE — Telephone Encounter (Signed)
Attempted to contact pt regarding discharge from [facility] on 03/11/18. Left message asking pt to call back regarding discharge instructions and/or medications. Advised pt of appt w/ Ignacia Bayley, NP on 03/14/18 at 2:00 w/ [facility]. Asked pt to call back if unable to keep this appt.

## 2018-03-11 NOTE — Telephone Encounter (Signed)
TCM....  Patient is being discharged   They saw Thurmond Butts in hospital   They are scheduled to see Ignacia Bayley on 03/14/18   They were seen for   They need to be seen within 1 week   Pt is not on the  wait list   Please call

## 2018-03-11 NOTE — Telephone Encounter (Signed)
-----   Message from Rise Mu, PA-C sent at 03/09/2018 10:18 AM EDT ----- Can this patient get a TCM follow up with Dr. Fletcher Anon or an APP this week? High risk of medication non-compliance.

## 2018-03-13 ENCOUNTER — Other Ambulatory Visit: Payer: Self-pay | Admitting: Family Medicine

## 2018-03-13 DIAGNOSIS — R911 Solitary pulmonary nodule: Secondary | ICD-10-CM

## 2018-03-14 ENCOUNTER — Encounter: Payer: Self-pay | Admitting: Nurse Practitioner

## 2018-03-14 ENCOUNTER — Ambulatory Visit (INDEPENDENT_AMBULATORY_CARE_PROVIDER_SITE_OTHER): Payer: Medicaid Other | Admitting: Nurse Practitioner

## 2018-03-14 VITALS — BP 122/64 | HR 86 | Ht 67.0 in | Wt 141.8 lb

## 2018-03-14 DIAGNOSIS — Z720 Tobacco use: Secondary | ICD-10-CM

## 2018-03-14 DIAGNOSIS — I2119 ST elevation (STEMI) myocardial infarction involving other coronary artery of inferior wall: Secondary | ICD-10-CM

## 2018-03-14 DIAGNOSIS — I255 Ischemic cardiomyopathy: Secondary | ICD-10-CM | POA: Diagnosis not present

## 2018-03-14 DIAGNOSIS — I10 Essential (primary) hypertension: Secondary | ICD-10-CM

## 2018-03-14 DIAGNOSIS — I739 Peripheral vascular disease, unspecified: Secondary | ICD-10-CM | POA: Diagnosis not present

## 2018-03-14 DIAGNOSIS — I2510 Atherosclerotic heart disease of native coronary artery without angina pectoris: Secondary | ICD-10-CM | POA: Diagnosis not present

## 2018-03-14 DIAGNOSIS — E785 Hyperlipidemia, unspecified: Secondary | ICD-10-CM | POA: Diagnosis not present

## 2018-03-14 MED ORDER — TICAGRELOR 90 MG PO TABS: 90.0000 mg | ORAL_TABLET | Freq: Two times a day (BID) | ORAL | 3 refills | Status: DC

## 2018-03-14 MED ORDER — NITROGLYCERIN 0.4 MG SL SUBL: 0.4000 mg | SUBLINGUAL_TABLET | SUBLINGUAL | 3 refills | Status: DC | PRN

## 2018-03-14 NOTE — Progress Notes (Signed)
Office Visit    Patient Name: [name] Date of Encounter: 03/14/2018  Primary Care Provider:  Gayland Curry, MD Primary Cardiologist:  Kathlyn Sacramento, MD  Chief Complaint    57 year old male with a history of CAD status post recent inferior STEMI and RCA stenting, hypertension, hyperlipidemia, ischemic cardiomyopathy, peripheral arterial disease, tobacco abuse, bipolar disorder, anxiety, seizure disorder, and schizophrenia, who presents for follow-up after recent hospitalization and RCA stenting.  Past Medical History    Past Medical History:  Diagnosis Date  . Anxiety   . Bipolar affective ([place])    takes Lithium daily  . Broken fingers   . Coronary artery disease    a. 1997 PCI: RCA ([facility]); b. 2008 PCI: RCA; c. 05/2012 Cath: LCx 100 (previously stented - ? date), RCA stent w/ mild ISR, LAD nonobs, EF 35%; d. 06/2013 Cath: stable anatomy; e. 03/2018 Inf STEMI/PCI: LCX 100o/p ISR (CTO), RCA 141m/d (3.0x38 Resolute Onyx DES), EF 40-45%.   . Depression   . GERD (gastroesophageal reflux disease)    takes Protonix daily  . Hepatitis C    "diagnosed in the past 2 wk" (10/22/2012)  . History of ETOH abuse    quit 2007  . History of radiation exposure 12/17/17- 01/24/18   Larynx treated to 63 gy in 28 fx of 2.25 Gy  . Hyperlipidemia   . Hypertension    "pt. denies - no longer taking blood pressure medications"  . Ischemic cardiomyopathy    a. 03/2018 LV gram: EF 40-45% @ time of MI.  Marland Kitchen Myocardial infarction ([place])    x 4  . PAD (peripheral artery disease) ([place])    a. 2010 PTA & stent to left CIA & left SFA; b. 05/2012 PTA & stent to distal LCIA; c. 10/2012 thrombectomy and stents to [place]; d. 11/2014 s/p Ao-Bifem bypass.  . Schizophrenia ([place])   . Seizures ([place])    "when I get anxious" (Stated last seizure three months ago)  . Tobacco abuse   . Urinary frequency    Past Surgical History:  Procedure Laterality Date  . ABDOMINAL AORTAGRAM N/A 10/22/2012   Procedure: ABDOMINAL Maxcine Ham;  Surgeon: Wellington Hampshire, MD;  Location: [place] CATH LAB;  Service: Cardiovascular;  Laterality: N/A;  . ABDOMINAL AORTAGRAM N/A 11/11/2014   Procedure: ABDOMINAL Maxcine Ham;  Surgeon: Wellington Hampshire, MD;  Location: [place] CATH LAB;  Service: Cardiovascular;  Laterality: N/A;  . ANTERIOR CERVICAL DECOMP/DISCECTOMY FUSION  01/2011   plate & screw placement   . ANTERIOR CERVICAL DECOMP/DISCECTOMY FUSION N/A 01/26/2017   Procedure: ANTERIOR CERVICAL DECOMPRESSION/DISCECTOMY FUSION CERVICAL FOUR- CERVICAL FIVE;  Surgeon: Ashok Pall, MD;  Location: [place];  Service: Neurosurgery;  Laterality: N/A;  ANTERIOR CERVICAL DECOMPRESSION/DISCECTOMY FUSION CERVICAL FOUR- CERVICAL FIVE  . AORTA - BILATERAL FEMORAL ARTERY BYPASS GRAFT N/A 11/20/2014   Procedure: AORTA BIFEMORAL BYPASS GRAFT;  Surgeon: Mal Misty, MD;  Location: [place];  Service: Vascular;  Laterality: N/A;  . BACK SURGERY    . BRAIN SURGERY  1989   subdural hematoma  . CARDIAC CATHETERIZATION     Stent - 12  . CHOLECYSTECTOMY N/A 06/29/2017   Procedure: LAPAROSCOPIC CHOLECYSTECTOMY WITH INTRAOPERATIVE CHOLANGIOGRAM, POSSIBLE OPEN;  Surgeon: Robert Bellow, MD;  Location: [facility];  Service: General;  Laterality: N/A;  . CORONARY ANGIOPLASTY WITH STENT PLACEMENT  03/2007   to RCA  . CORONARY/GRAFT ACUTE MI REVASCULARIZATION N/A 03/07/2018   Procedure: Coronary/Graft Acute MI Revascularization;  Surgeon: Isaias Cowman, MD;  Location: [place] CV LAB;  Service: Cardiovascular;  Laterality: N/A;  . HERNIA REPAIR    . ILIAC ARTERY STENT  10/22/2012   "2" (10/22/2012)  . ILIAC ARTERY STENT  06/2012   left  . INCISIONAL HERNIA REPAIR N/A 11/08/2015   Procedure: INCISIONAL HERNIA REPAIR WITH MYOFASCIAL RELEASE;  Surgeon: Rolm Bookbinder, MD;  Location: [place];  Service: General;  Laterality: N/A;  . INSERTION OF MESH N/A 11/08/2015   Procedure: INSERTION OF MESH;  Surgeon: Rolm Bookbinder, MD;  Location:  [place];  Service: General;  Laterality: N/A;  . LEFT HEART CATH AND CORONARY ANGIOGRAPHY N/A 03/07/2018   Procedure: LEFT HEART CATH AND CORONARY ANGIOGRAPHY;  Surgeon: Isaias Cowman, MD;  Location: [place] CV LAB;  Service: Cardiovascular;  Laterality: N/A;  . LEFT HEART CATHETERIZATION WITH CORONARY ANGIOGRAM N/A 05/29/2012   Procedure: LEFT HEART CATHETERIZATION WITH CORONARY ANGIOGRAM;  Surgeon: Wellington Hampshire, MD;  Location: [place] CATH LAB;  Service: Cardiovascular;  Laterality: N/A;  . LIVER BIOPSY  10/2012  . LOWER EXTREMITY ANGIOGRAM N/A 05/29/2012   Procedure: LOWER EXTREMITY ANGIOGRAM;  Surgeon: Wellington Hampshire, MD;  Location: [place] CATH LAB;  Service: Cardiovascular;  Laterality: N/A;  . MICROLARYNGOSCOPY WITH CO2 LASER AND EXCISION OF VOCAL CORD LESION N/A 11/21/2017   Procedure: MICROLARYNGOSCOPY WITH EXCISION OF VOCAL CORD LESION;  Surgeon: Jerrell Belfast, MD;  Location: [place];  Service: ENT;  Laterality: N/A;  . PERCUTANEOUS STENT INTERVENTION Left 05/29/2012   Procedure: PERCUTANEOUS STENT INTERVENTION;  Surgeon: Wellington Hampshire, MD;  Location: [place] CATH LAB;  Service: Cardiovascular;  Laterality: Left;  . POSTERIOR FUSION CERVICAL SPINE  2012  . SUBDURAL HEMATOMA EVACUATION VIA CRANIOTOMY      Allergies  Allergies  Allergen Reactions  . Lisinopril Other (See Comments)    Other reaction(s): Other (See Comments) Elevated creatine and very elevated potassium Elevated creatine and very elevated potassium Elevated creatine and very elevated potassium     History of Present Illness    57 year old male with a prior history of CAD status post multiple myocardial infarctions and percutaneous interventions dating back to [birth year] when he was 57 years old.  Other history includes hypertension, hyperlipidemia, ongoing tobacco abuse, ischemic cardiomyopathy, peripheral arterial disease status post prior stenting and subsequent bypass in 2015, bipolar disorder, depression, anxiety,  schizophrenia, and seizure disorder.  As noted, he underwent PCI of the RCA in 1997 with subsequent redo PCI to RCA in 2008.  Although the timing is unclear, at some point, he underwent circumflex stenting.  In June 2013, he was found to have occlusion of the previously placed circumflex stent with otherwise stable anatomy.  He had similar anatomy in July 2014.  Last week, he presented to [facility] with chest pain and inferior ST elevation.  He was found to have a total occlusion of the RCA with in-stent restenosis.  This was successfully treated with PTCA followed by drug-eluting stent placement.  EF was 40-45%.  He tolerated the procedure and the postprocedure course well and was subsequently discharged April 6.  Since discharge, he reports feeling well.  He has not been experiencing any chest pain or dyspnea.  His right groin site has healed up well.  He reports compliance with medications.  He continues to smoke 1 pack a day and is not currently interested in quitting.  He is not interested in cardiac rehab but does not plan to attend.  Home Medications    Prior to Admission medications   Medication  Sig Start Date End Date Taking? Authorizing Provider  acetaminophen (TYLENOL) 500 MG tablet Take 1-2 tabs every 6 hours prn pain. Do not exceed 7 tablets daily. 12/24/17  Yes Eppie Gibson, MD  aspirin 81 MG chewable tablet Chew 1 tablet (81 mg total) by mouth daily. 03/10/18  Yes Pyreddy, Reatha Harps, MD  atorvastatin (LIPITOR) 40 MG tablet Take 1 tablet (40 mg total) by mouth daily at 6 PM. 03/09/18  Yes Pyreddy, Reatha Harps, MD  carvedilol (COREG) 3.125 MG tablet Take 1 tablet (3.125 mg total) by mouth 2 (two) times daily with a meal. 03/09/18  Yes Pyreddy, Reatha Harps, MD  divalproex (DEPAKOTE ER) 500 MG 24 hr tablet Take 1,500 mg by mouth daily.  06/17/17  Yes [provider]  feeding supplement, ENSURE ENLIVE, (ENSURE ENLIVE) LIQD Take 237 mLs by mouth 2 (two) times daily between meals. 03/09/18  Yes Pyreddy,  Reatha Harps, MD  gabapentin (NEURONTIN) 300 MG capsule Take 1 capsule (300 mg total) by mouth 3 (three) times daily. For pain. 01/15/18  Yes Eppie Gibson, MD  lithium carbonate (ESKALITH) 450 MG CR tablet Take 450 mg by mouth 2 (two) times daily.   Yes [provider]  losartan (COZAAR) 50 MG tablet Take 1 tablet (50 mg total) by mouth daily. 03/10/18  Yes Pyreddy, Reatha Harps, MD  pantoprazole (PROTONIX) 40 MG tablet Take 40 mg by mouth daily at 2 PM. In the afternoon. 06/10/17  Yes [provider]  ticagrelor (BRILINTA) 90 MG TABS tablet Take 1 tablet (90 mg total) by mouth 2 (two) times daily. 03/09/18  Yes Pyreddy, Reatha Harps, MD    Review of Systems    He denies chest pain, palpitations, dyspnea, pnd, orthopnea, n, v, dizziness, syncope, edema, weight gain, or early satiety.  All other systems reviewed and are otherwise negative except as noted above.  Physical Exam    VS:  BP 122/64 (BP Location: Left Arm, Patient Position: Sitting, Cuff Size: Normal)   Pulse 86   Ht 5\' 7"  (1.702 m)   Wt 141 lb 12 oz (64.3 kg)   BMI 22.20 kg/m  , BMI Body mass index is 22.2 kg/m. GEN: Well nourished, well developed, in no acute distress.  HEENT: normal.  Neck: Supple, no JVD, carotid bruits, or masses. Cardiac: RRR, no murmurs, rubs, or gallops. No clubbing, cyanosis, edema.  Radials/DP/PT 2+ and equal bilaterally.  Patient refused right groin examination. Respiratory: Barrel chested.  Respirations regular and unlabored, diminished breath sounds bilaterally. GI: Soft, nontender, nondistended, BS + x 4. MS: no deformity or atrophy. Skin: warm and dry, no rash. Neuro:  Strength and sensation are intact. Psych: Normal affect.  Accessory Clinical Findings    ECG -regular sinus rhythm, 86, left axis, prior inferior infarct, prior lateral infarct, no acute changes.  Assessment & Plan    1.  Inferior ST segment elevation myocardial infarction, subsequent episode of care/CAD: Status post recent  admission with chest pain and inferior ST segment elevation.  Found to have total occlusion and in-stent restenosis within the RCA and this was successfully stented with a drug-eluting stent.  He has a chronic total occlusion with in-stent restenosis in the left circumflex and moderate nonobstructive LAD disease.  EF was 40-45%.  Since discharge she has done well without recurrent chest pain or dyspnea.  He remains on aspirin, statin, beta-blocker, ARB, and Brilinta therapy.  I will refill his Brilinta with a plan to continue this for 12 months.  I have also provided a prescription for sublingual nitroglycerin.  We did discuss the role of cardiac rehabilitation in his recovery and he is fairly clear that he is not interested.  2.  Essential hypertension: Stable on beta-blocker and ARB.  3.  Hyperlipidemia: LDL of 70 on April 4.  He remains on high potency statin therapy.  4.  Ischemic cardiomyopathy: EF 40-45% in the setting of recent myocardial infarction.  Euvolemic on exam.  Continue beta-blocker and ARB.    5.  Tobacco abuse: Continues to smoke 1 pack/day.  Complete cessation advised.  He says he is not interested in quitting at this time as it helps his nerves.  6.  Bipolar disorder: Followed by psychiatry and primary care.  He remains on lithium.  7.  Seizure disorder: Stable on Depakote.  8.  PAD: s/p Ao-bifem bypass in 2015. No recurrent claudication.  9.  Disposition: Follow-up with Dr. Fletcher Anon in 2-3 months or sooner if necessary.  Murray Hodgkins, NP 03/14/2018, 2:17 PM

## 2018-03-14 NOTE — Patient Instructions (Addendum)
Medication Instructions: - Your physician recommends that you continue on your current medications as directed. Please refer to the Current Medication list given to you today.  ( prescription refills have been sent to the pharmacy for brilinta and nitroglycerin tablets)   Labwork: - none ordered  Procedures/Testing: - none ordered  Follow-Up: - Your physician recommends that you schedule a follow-up appointment in: 2-3 months with Dr. Fletcher Anon.   Any Additional Special Instructions Will Be Listed Below (If Applicable).     If you need a refill on your cardiac medications before your next appointment, please call your pharmacy.

## 2018-03-18 ENCOUNTER — Ambulatory Visit: Payer: Medicaid Other | Attending: Radiation Oncology

## 2018-03-18 DIAGNOSIS — R1312 Dysphagia, oropharyngeal phase: Secondary | ICD-10-CM | POA: Insufficient documentation

## 2018-03-18 NOTE — Therapy (Signed)
[facility] [address] [place], [place], [address] Phone: [phone]   Fax:  [phone]  Speech Language Pathology Treatment  Patient Details  Name: [name] MRN: [id number] Date of Birth: 22-Apr-1961 Referring Provider: Eppie Gibson, MD   Encounter Date: 03/18/2018  End of Session - 03/18/18 1347    Visit Number  3    Number of Visits  7    Date for SLP Re-Evaluation  06/24/18    Authorization Type  Medicaid    Authorization Time Period  01-21-18 to 03-20-18    Authorization - Visit Number  1    Authorization - Number of Visits  1    SLP Start Time  7517    SLP Stop Time   1345    SLP Time Calculation (min)  33 min    Activity Tolerance  Patient tolerated treatment well       Past Medical History:  Diagnosis Date  . Anxiety   . Bipolar affective ([place])    takes Lithium daily  . Broken fingers   . Coronary artery disease    a. 1997 PCI: RCA ([facility]); b. 2008 PCI: RCA; c. 05/2012 Cath: LCx 100 (previously stented - ? date), RCA stent w/ mild ISR, LAD nonobs, EF 35%; d. 06/2013 Cath: stable anatomy; e. 03/2018 Inf STEMI/PCI: LCX 100o/p ISR (CTO), RCA 125md (3.0x38 Resolute Onyx DES), EF 40-45%.   . Depression   . GERD (gastroesophageal reflux disease)    takes Protonix daily  . Hepatitis C    "diagnosed in the past 2 wk" (10/22/2012)  . History of ETOH abuse    quit 2007  . History of radiation exposure 12/17/17- 01/24/18   Larynx treated to 63 gy in 28 fx of 2.25 Gy  . Hyperlipidemia   . Hypertension    "pt. denies - no longer taking blood pressure medications"  . Ischemic cardiomyopathy    a. 03/2018 LV gram: EF 40-45% @ time of MI.  .Marland KitchenMyocardial infarction ([place]    x 4  . PAD (peripheral artery disease) ([place]    a. 2010 PTA & stent to left CIA & left SFA; b. 05/2012 PTA & stent to distal LCIA; c. 10/2012 thrombectomy and stents to [place] d. 11/2014 s/p Ao-Bifem bypass.  . Schizophrenia ([place]    . Seizures ([place]    "when I get anxious" (Stated last seizure three months ago)  . Tobacco abuse   . Urinary frequency     Past Surgical History:  Procedure Laterality Date  . ABDOMINAL AORTAGRAM N/A 10/22/2012   Procedure: ABDOMINAL AMaxcine Ham  Surgeon: MWellington Hampshire MD;  [facility];  Service: Cardiovascular;  Laterality: N/A;  . ABDOMINAL AORTAGRAM N/A 11/11/2014   Procedure: ABDOMINAL AMaxcine Ham  Surgeon: MWellington Hampshire MD;  [facility];  Service: Cardiovascular;  Laterality: N/A;  . ANTERIOR CERVICAL DECOMP/DISCECTOMY FUSION  01/2011   plate & screw placement   . ANTERIOR CERVICAL DECOMP/DISCECTOMY FUSION N/A 01/26/2017   Procedure: ANTERIOR CERVICAL DECOMPRESSION/DISCECTOMY FUSION CERVICAL FOUR- CERVICAL FIVE;  Surgeon: KAshok Pall MD;  [facility]  Service: Neurosurgery;  Laterality: N/A;  ANTERIOR CERVICAL DECOMPRESSION/DISCECTOMY FUSION CERVICAL FOUR- CERVICAL FIVE  . AORTA - BILATERAL FEMORAL ARTERY BYPASS GRAFT N/A 11/20/2014   Procedure: AORTA BIFEMORAL BYPASS GRAFT;  Surgeon: JMal Misty MD;  [facility]  Service: Vascular;  Laterality: N/A;  . BACK SURGERY    . [place]  subdural hematoma  . CARDIAC CATHETERIZATION     Stent - 12  . CHOLECYSTECTOMY N/A 06/29/2017   Procedure: LAPAROSCOPIC CHOLECYSTECTOMY WITH INTRAOPERATIVE CHOLANGIOGRAM, POSSIBLE OPEN;  Surgeon: Robert Bellow, MD;  Location: [facility];  Service: General;  Laterality: N/A;  . CORONARY ANGIOPLASTY WITH STENT PLACEMENT  03/2007   to RCA  . CORONARY/GRAFT ACUTE MI REVASCULARIZATION N/A 03/07/2018   Procedure: Coronary/Graft Acute MI Revascularization;  Surgeon: Isaias Cowman, MD;  Location: [facility];  Service: Cardiovascular;  Laterality: N/A;  . HERNIA REPAIR    . ILIAC ARTERY STENT  10/22/2012   "2" (10/22/2012)  . ILIAC ARTERY STENT  06/2012   left  . INCISIONAL HERNIA REPAIR N/A 11/08/2015   Procedure: INCISIONAL HERNIA REPAIR  WITH MYOFASCIAL RELEASE;  Surgeon: Rolm Bookbinder, MD;  Location: [facility];  Service: General;  Laterality: N/A;  . INSERTION OF MESH N/A 11/08/2015   Procedure: INSERTION OF MESH;  Surgeon: Rolm Bookbinder, MD;  Location: [facility];  Service: General;  Laterality: N/A;  . LEFT HEART CATH AND CORONARY ANGIOGRAPHY N/A 03/07/2018   Procedure: LEFT HEART CATH AND CORONARY ANGIOGRAPHY;  Surgeon: Isaias Cowman, MD;  Location: [facility];  Service: Cardiovascular;  Laterality: N/A;  . LEFT HEART CATHETERIZATION WITH CORONARY ANGIOGRAM N/A 05/29/2012   Procedure: LEFT HEART CATHETERIZATION WITH CORONARY ANGIOGRAM;  Surgeon: Wellington Hampshire, MD;  Location: [facility];  Service: Cardiovascular;  Laterality: N/A;  . LIVER BIOPSY  10/2012  . LOWER EXTREMITY ANGIOGRAM N/A 05/29/2012   Procedure: LOWER EXTREMITY ANGIOGRAM;  Surgeon: Wellington Hampshire, MD;  Location: [facility];  Service: Cardiovascular;  Laterality: N/A;  . MICROLARYNGOSCOPY WITH CO2 LASER AND EXCISION OF VOCAL CORD LESION N/A 11/21/2017   Procedure: MICROLARYNGOSCOPY WITH EXCISION OF VOCAL CORD LESION;  Surgeon: Jerrell Belfast, MD;  Location: [facility];  Service: ENT;  Laterality: N/A;  . PERCUTANEOUS STENT INTERVENTION Left 05/29/2012   Procedure: PERCUTANEOUS STENT INTERVENTION;  Surgeon: Wellington Hampshire, MD;  Location: [facility];  Service: Cardiovascular;  Laterality: Left;  . POSTERIOR FUSION CERVICAL SPINE  2012  . SUBDURAL HEMATOMA EVACUATION VIA CRANIOTOMY      There were no vitals filed for this visit.  Subjective Assessment - 03/18/18 1310    Subjective  Pt with cardiac cath last week. No chest pain currently.            ADULT SLP TREATMENT - 03/18/18 1319      General Information   Behavior/Cognition  Alert;Cooperative;Pleasant mood      Treatment Provided   Treatment provided  Dysphagia      Dysphagia Treatment   Temperature Spikes Noted  No    Respiratory Status  Room air    Oral Cavity -  Dentition  Edentulous    Treatment Methods  Skilled observation;Therapeutic exercise;Patient/caregiver education    Patient observed directly with PO's  Yes    Type of PO's observed  Dysphagia 3 (soft);Thin liquids    Feeding  Able to feed self    Liquids provided via  Cup    Oral Phase Signs & Symptoms  -- none noted    Pharyngeal Phase Signs & Symptoms  -- none noted    Other treatment/comments  "I can eat pretty much anything I want. It's because I don't have any teeth."  Pt denies getting choked, "strangled", or throat clearing during meals.  Pt again admits to not doing HEP at all since last viist (February 2019). He told SLP  rationale for HEP with independence. SLP reiterated rationale for HEP and told s/s aspiration and that pt should contact ENT, rad onc (if still following him), or his PCP if these occur. SLP also provided pt with overt s/s aspiration PNA handout and pt told SLP 3 overt s/s wiht modified independence.      Assessment / Recommendations / Plan   Plan  -- d/c-due to HEP noncompliance, and WNL/WFL swallowing      Progression Toward Goals   Progression toward goals  -- d/c day - see goal update       SLP Education - 03/18/18 1346    Education provided  Yes    Education Details  HEP procedure, reiterated rationale for HEP, overt s/s aspiraiton PNA and what pt should do in case they occur    Person(s) Educated  Patient    Methods  Explanation;Demonstration;Handout;Verbal cues    Comprehension  Verbalized understanding;Returned demonstration;Verbal cues required       SLP Short Term Goals - 03/18/18 1341      SLP SHORT TERM GOAL #1   Title  pt will complete HEP with occasional mod A    Baseline  total A    Time  1    Period  -- visit    Status  Not Met and ongoing      SLP SHORT TERM GOAL #2   Title  pt will tell SLP why he is completing HEP with modified independence    Baseline  total A    Status  Achieved      SLP SHORT TERM GOAL #3   Title  pt will tell  SLP 3 overt s/s of aspiration PNA with modified independence    Status  Achieved and to continue; not addressed by SLP today       SLP Long Term Goals - 03/18/18 1327      SLP LONG TERM GOAL #1   Title  pt will tell SLP 3 overt s/s of aspiration PNA with modified independence over two visits    Time  --    Period  --    Status  Partially Met 1/2 visits      SLP LONG TERM GOAL #2   Title  pt will complete HEP with occasional min A over two visits    Baseline  --    Time  --    Period  --    Status  Not Met      SLP LONG TERM GOAL #3   Title  pt will tell SLP when HEP frequency can be reduced to x2-3/week    Time  --    Period  --    Status  Not Met       Plan - 03/18/18 1350    Clinical Impression Statement  Pt with oropharyngeal swallowing WNL for soft solids and thin liquids. Pt without hydrophonic voice today, and WNL voice quality. Pt again admitted to total noncompliance with HEP and today pt again eventually completed exercises with independence with total A from SLP. Due to noncompliance over two months time, and WNL swallowing the pt will be discharged at this time. He is agreeable to this. SLP told pt that the probability of swallowing difficulty increases after third month following last rad tx "(for pt this would be mid-late May 2019). Please see "skilled intervention" and "pt education" for more details.     Treatment/Interventions  Aspiration precaution training;Pharyngeal strengthening exercises;Diet toleration management by SLP;Trials of upgraded  texture/liquids;Internal/external aids;Patient/family education;Compensatory strategies;SLP instruction and feedback    Potential to Achieve Goals  Good    SLP Home Exercise Plan  provided today    Consulted and Agree with Plan of Care  Patient       Patient will benefit from skilled therapeutic intervention in order to improve the following deficits and impairments:   Oropharyngeal dysphagia   SPEECH THERAPY DISCHARGE  SUMMARY  Visits from Start of Care: 3  Current functional level related to goals / functional outcomes: In two follow up sessions spanning two months, and from pt's initial ST eval, pt reports not completing any of the prescribed HEP.  Yet, pt remains with WNL swallow function to date. SLP shared with pt that probability for dysphagia increases after his 3rd month following rad tx "(in pt's case, 04-23-18 and beyond).  Pt will be d/c'd from [facility] as he is satisfied with current level. See " pt education" and "skilled intervention" for more details.    Remaining deficits: None, however probability for difficulty increases after 04/23/18.   Education / Equipment: HEP, late effects head/neck radiation, overt s/s aspiration, and overt s/s aspiration PNA.   Plan: Patient agrees to discharge.  Patient goals were partially met. Patient is being discharged due to being pleased with the current functional level.  ?????       Problem List Patient Active Problem List   Diagnosis Date Noted  . Malnutrition of moderate degree 03/09/2018  . STEMI involving oth coronary artery of inferior wall ([place]) 03/07/2018  . Malignant neoplasm of glottis ([place]) 12/11/2017  . 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 Cholecystitis 06/29/2017  . Osteoarthritis of spine with radiculopathy, cervical region 01/26/2017  . Gallbladder polyp 06/01/2016  . Rib pain on right side 06/01/2016  . ETOH abuse 11/24/2014  . Bipolar disorder, currently in remission ([place]) 11/24/2014  . Alcohol dependence with withdrawal with complication ([place]) 44/45/8483  . Chronic hepatitis C ([place]) 09/12/2013  . Cardiomyopathy, ischemic 06/09/2013  . PAD (peripheral artery disease) ([place]) 05/23/2012  . HTN (hypertension) 10/09/2011  . CAROTID BRUIT 12/19/2010  . DYSPNEA 06/15/2010  . Hyperlipidemia 05/06/2010  . TOBACCO USER 05/06/2010  . CAD, NATIVE VESSEL 05/06/2010  . 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 [place], [place], [address] Phone: [phone]   Fax:  [phone]   Name: MERVIL WACKER MRN: [id number] Date of Birth: March 05, 1961

## 2018-03-21 ENCOUNTER — Ambulatory Visit
Admission: RE | Admit: 2018-03-21 | Discharge: 2018-03-21 | Disposition: A | Discharge status: Home / Self Care | Payer: Medicaid Other | Source: Ambulatory Visit | Attending: Family Medicine | Admitting: Family Medicine

## 2018-03-21 DIAGNOSIS — I700 Atherosclerosis of aorta: Secondary | ICD-10-CM | POA: Diagnosis not present

## 2018-03-21 DIAGNOSIS — I219 Acute myocardial infarction, unspecified: Secondary | ICD-10-CM | POA: Insufficient documentation

## 2018-03-21 DIAGNOSIS — R918 Other nonspecific abnormal finding of lung field: Secondary | ICD-10-CM | POA: Insufficient documentation

## 2018-03-21 DIAGNOSIS — K769 Liver disease, unspecified: Secondary | ICD-10-CM | POA: Diagnosis not present

## 2018-03-21 DIAGNOSIS — R911 Solitary pulmonary nodule: Secondary | ICD-10-CM | POA: Diagnosis present

## 2018-04-21 ENCOUNTER — Emergency Department: Payer: Medicaid Other

## 2018-04-21 ENCOUNTER — Emergency Department
Admission: EM | Admit: 2018-04-21 | Discharge: 2018-04-21 | Disposition: A | Discharge status: Home / Self Care | Payer: Medicaid Other | Attending: Emergency Medicine | Admitting: Emergency Medicine

## 2018-04-21 ENCOUNTER — Other Ambulatory Visit: Payer: Self-pay

## 2018-04-21 ENCOUNTER — Encounter: Payer: Self-pay | Admitting: Emergency Medicine

## 2018-04-21 DIAGNOSIS — F419 Anxiety disorder, unspecified: Secondary | ICD-10-CM | POA: Diagnosis not present

## 2018-04-21 DIAGNOSIS — I2510 Atherosclerotic heart disease of native coronary artery without angina pectoris: Secondary | ICD-10-CM | POA: Insufficient documentation

## 2018-04-21 DIAGNOSIS — F319 Bipolar disorder, unspecified: Secondary | ICD-10-CM | POA: Insufficient documentation

## 2018-04-21 DIAGNOSIS — R1013 Epigastric pain: Secondary | ICD-10-CM | POA: Diagnosis not present

## 2018-04-21 DIAGNOSIS — F209 Schizophrenia, unspecified: Secondary | ICD-10-CM | POA: Diagnosis not present

## 2018-04-21 DIAGNOSIS — Z7982 Long term (current) use of aspirin: Secondary | ICD-10-CM | POA: Diagnosis not present

## 2018-04-21 DIAGNOSIS — I252 Old myocardial infarction: Secondary | ICD-10-CM | POA: Diagnosis not present

## 2018-04-21 DIAGNOSIS — R079 Chest pain, unspecified: Secondary | ICD-10-CM | POA: Insufficient documentation

## 2018-04-21 DIAGNOSIS — F10120 Alcohol abuse with intoxication, uncomplicated: Secondary | ICD-10-CM | POA: Diagnosis not present

## 2018-04-21 DIAGNOSIS — Z9049 Acquired absence of other specified parts of digestive tract: Secondary | ICD-10-CM | POA: Diagnosis not present

## 2018-04-21 DIAGNOSIS — F17210 Nicotine dependence, cigarettes, uncomplicated: Secondary | ICD-10-CM | POA: Diagnosis not present

## 2018-04-21 DIAGNOSIS — Z955 Presence of coronary angioplasty implant and graft: Secondary | ICD-10-CM | POA: Insufficient documentation

## 2018-04-21 DIAGNOSIS — I10 Essential (primary) hypertension: Secondary | ICD-10-CM | POA: Diagnosis not present

## 2018-04-21 DIAGNOSIS — Z8521 Personal history of malignant neoplasm of larynx: Secondary | ICD-10-CM | POA: Diagnosis not present

## 2018-04-21 DIAGNOSIS — Z79899 Other long term (current) drug therapy: Secondary | ICD-10-CM | POA: Insufficient documentation

## 2018-04-21 DIAGNOSIS — F10129 Alcohol abuse with intoxication, unspecified: Secondary | ICD-10-CM

## 2018-04-21 LAB — URINE DRUG SCREEN, QUALITATIVE (ARMC ONLY)
Amphetamines, Ur Screen: NOT DETECTED
Barbiturates, Ur Screen: NOT DETECTED
Benzodiazepine, Ur Scrn: NOT DETECTED
Cannabinoid 50 Ng, Ur ~~LOC~~: NOT DETECTED
Cocaine Metabolite,Ur ~~LOC~~: NOT DETECTED
MDMA (Ecstasy)Ur Screen: NOT DETECTED
Methadone Scn, Ur: NOT DETECTED
Opiate, Ur Screen: POSITIVE — AB
Phencyclidine (PCP) Ur S: NOT DETECTED
Tricyclic, Ur Screen: NOT DETECTED

## 2018-04-21 LAB — COMPREHENSIVE METABOLIC PANEL
ALT: 93 U/L — ABNORMAL HIGH (ref 17–63)
AST: 62 U/L — ABNORMAL HIGH (ref 15–41)
Albumin: 4.1 g/dL (ref 3.5–5.0)
Alkaline Phosphatase: 57 U/L (ref 38–126)
Anion gap: 12 (ref 5–15)
BUN: 6 mg/dL (ref 6–20)
CO2: 21 mmol/L — ABNORMAL LOW (ref 22–32)
Calcium: 9.4 mg/dL (ref 8.9–10.3)
Chloride: 105 mmol/L (ref 101–111)
Creatinine, Ser: 0.69 mg/dL (ref 0.61–1.24)
GFR calc Af Amer: >60 mL/min (ref >60)
GFR calc non Af Amer: >60 mL/min (ref >60)
Glucose, Bld: 94 mg/dL (ref 65–99)
Potassium: 4.2 mmol/L (ref 3.5–5.1)
Sodium: 138 mmol/L (ref 135–145)
Total Bilirubin: 1.1 mg/dL (ref 0.3–1.2)
Total Protein: 7.2 g/dL (ref 6.5–8.1)

## 2018-04-21 LAB — PROTIME-INR
INR: 0.91
Prothrombin Time: 12.2 seconds (ref 11.4–15.2)

## 2018-04-21 LAB — CBC
HCT: 44.4 % (ref 40.0–52.0)
Hemoglobin: 15.2 g/dL (ref 13.0–18.0)
MCH: 32 pg (ref 26.0–34.0)
MCHC: 34.2 g/dL (ref 32.0–36.0)
MCV: 93.7 fL (ref 80.0–100.0)
Platelets: 215 10*3/uL (ref 150–440)
RBC: 4.73 MIL/uL (ref 4.40–5.90)
RDW: 14.9 % — ABNORMAL HIGH (ref 11.5–14.5)
WBC: 8.4 10*3/uL (ref 3.8–10.6)

## 2018-04-21 LAB — APTT: aPTT: 27 seconds (ref 24–36)

## 2018-04-21 LAB — ETHANOL: Alcohol, Ethyl (B): 222 mg/dL — ABNORMAL HIGH (ref <10)

## 2018-04-21 LAB — TROPONIN I
Troponin I: <0.03 ng/mL (ref <0.03)
Troponin I: 0.03 ng/mL (ref <0.03)

## 2018-04-21 LAB — LIPASE, BLOOD: Lipase: 33 U/L (ref 11–51)

## 2018-04-21 MED ORDER — FAMOTIDINE IN NACL 20-0.9 MG/50ML-% IV SOLN: 20.0000 mg | Freq: Once | INTRAVENOUS | Status: DC

## 2018-04-21 MED ORDER — MORPHINE SULFATE (PF) 4 MG/ML IV SOLN
4.0000 mg | Freq: Once | INTRAVENOUS | Status: AC
  Administered 2018-04-21: 4 mg via INTRAVENOUS
  Filled 2018-04-21: qty 1

## 2018-04-21 MED ORDER — NITROGLYCERIN 0.4 MG SL SUBL: 0.4000 mg | SUBLINGUAL_TABLET | SUBLINGUAL | Status: DC | PRN

## 2018-04-21 MED ORDER — THIAMINE HCL 100 MG/ML IJ SOLN
Freq: Once | INTRAVENOUS | Status: AC
  Administered 2018-04-21: 19:00:00 via INTRAVENOUS
  Filled 2018-04-21: qty 1000

## 2018-04-21 MED ORDER — ASPIRIN 81 MG PO CHEW
324.0000 mg | CHEWABLE_TABLET | Freq: Once | ORAL | Status: AC
  Administered 2018-04-21: 324 mg via ORAL
  Filled 2018-04-21: qty 4

## 2018-04-21 MED ORDER — ONDANSETRON HCL 4 MG/2ML IJ SOLN
4.0000 mg | Freq: Once | INTRAMUSCULAR | Status: AC
  Administered 2018-04-21: 4 mg via INTRAVENOUS

## 2018-04-21 MED ORDER — GI COCKTAIL ~~LOC~~
30.0000 mL | Freq: Once | ORAL | Status: AC
  Administered 2018-04-21: 30 mL via ORAL
  Filled 2018-04-21: qty 30

## 2018-04-21 NOTE — ED Notes (Signed)
Pt refusing medication. States "I can go home if I cant have anything for pain". I explained to pt the need for a 2nd troponin with regards to his cardiac history however pt states "I will just go home, tell the doctor."

## 2018-04-21 NOTE — ED Notes (Signed)
Patient did not sign for D/c because patient left without paperwork and being d/c by nurse but did talk to Dr  and did receive d/c instructions from MD

## 2018-04-21 NOTE — ED Triage Notes (Signed)
Pt in via POV with acute onset chest pain w/ radiation left neck, arm w/ asssociated diaphoresis, N/V.  Pt with hx of MI and CABG.  Vitals WDL.  NAD noted at this time.

## 2018-04-21 NOTE — ED Notes (Signed)
Date and time results received: 04/21/18 2152  Test: Troponin  Critical Value: 0.03   Dr. Alfred Levins notified

## 2018-04-21 NOTE — Discharge Instructions (Addendum)
Please follow-up with your cardiologist in 2 days or return to the emergency room for new or worsening chest pain.

## 2018-04-21 NOTE — ED Provider Notes (Signed)
[facility] Emergency Department Provider Note  ____________________________________________  Time seen: Approximately 4:28 PM  I have reviewed the triage vital signs and the nursing notes.   HISTORY  Chief Complaint Chest Pain   HPI [name] is a 57 y.o. male with a history of alcohol abuse, smoking, CAD status post CABG and stents and a recent STEMI on April 2019, hepatitis C, hypertension, hyperlipidemia, ischemic cardiomyopathy who presents for evaluation of chest pain.  Patient reports that he was sitting on his porch when he started to have pain in his chest.  He would describes that the pain was sudden in onset, sharp, located in the left side of his chest radiating down his left arm associated with diaphoresis, nausea, dizziness, shortness of breath.  He reports that this is exactly like his prior MIs.  He continues to drink and smoke cigarettes.  His last drink was last night.  When asked how much he drinks he says "a lot".  He smokes half a pack a day.  Currently complaining of 10 out of 10 pain.  He has tried 3 sublingual nitros at home with no relief.  He endorses compliance with his antiplatelet therapy.  He denies any personal or family history of blood clots, recent travel immobilization, leg pain or swelling, hemoptysis, or exogenous hormones.  He denies any paresthesias of his extremities or pain radiating to his back. He is also complaining of epigastric sharp pain which she describes as being the same pain that he is feeling on his chest.  Past Medical History:  Diagnosis Date  . Anxiety   . Bipolar affective ([place])    takes Lithium daily  . Broken fingers   . Coronary artery disease    a. 1997 PCI: RCA ([facility]); b. 2008 PCI: RCA; c. 05/2012 Cath: LCx 100 (previously stented - ? date), RCA stent w/ mild ISR, LAD nonobs, EF 35%; d. 06/2013 Cath: stable anatomy; e. 03/2018 Inf STEMI/PCI: LCX 100o/p ISR (CTO), RCA 167m/d (3.0x38 Resolute Onyx  DES), EF 40-45%.   . Depression   . GERD (gastroesophageal reflux disease)    takes Protonix daily  . Hepatitis C    "diagnosed in the past 2 wk" (10/22/2012)  . History of ETOH abuse    quit 2007  . History of radiation exposure 12/17/17- 01/24/18   Larynx treated to 63 gy in 28 fx of 2.25 Gy  . Hyperlipidemia   . Hypertension    "pt. denies - no longer taking blood pressure medications"  . Ischemic cardiomyopathy    a. 03/2018 LV gram: EF 40-45% @ time of MI.  Marland Kitchen Myocardial infarction ([place])    x 4  . PAD (peripheral artery disease) ([place])    a. 2010 PTA & stent to left CIA & left SFA; b. 05/2012 PTA & stent to distal LCIA; c. 10/2012 thrombectomy and stents to [place]; d. 11/2014 s/p Ao-Bifem bypass.  . Schizophrenia ([place])   . Seizures ([place])    "when I get anxious" (Stated last seizure three months ago)  . Tobacco abuse   . Urinary frequency     Patient Active Problem List   Diagnosis Date Noted  . Malnutrition of moderate degree 03/09/2018  . STEMI involving oth coronary artery of inferior wall ([place]) 03/07/2018  . 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 Cholecystitis 06/29/2017  . Osteoarthritis of spine with radiculopathy, cervical region 01/26/2017  . Gallbladder polyp 06/01/2016  .  Rib pain on right side 06/01/2016  . ETOH abuse 11/24/2014  . Bipolar disorder, currently in remission ([place]) 11/24/2014  . Alcohol dependence with withdrawal with complication ([place]) 95/62/1308  . Chronic hepatitis C ([place]) 09/12/2013  . Cardiomyopathy, ischemic 06/09/2013  . PAD (peripheral artery disease) ([place]) 05/23/2012  . HTN (hypertension) 10/09/2011  . CAROTID BRUIT 12/19/2010  . DYSPNEA 06/15/2010  . Hyperlipidemia 05/06/2010  . TOBACCO USER 05/06/2010  . CAD, NATIVE VESSEL 05/06/2010  . 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 ABDOMINAL  AORTAGRAM N/A 10/22/2012   Procedure: ABDOMINAL Maxcine Ham;  Surgeon: Wellington Hampshire, MD;  Location: [facility] CATH LAB;  Service: Cardiovascular;  Laterality: N/A;  . ABDOMINAL AORTAGRAM N/A 11/11/2014   Procedure: ABDOMINAL Maxcine [place];  Surgeon: Wellington Hampshire, MD;  Location: [place] CATH LAB;  Service: Cardiovascular;  Laterality: N/A;  . ANTERIOR CERVICAL DECOMP/DISCECTOMY FUSION  01/2011   plate & screw placement   . ANTERIOR CERVICAL DECOMP/DISCECTOMY FUSION N/A 01/26/2017   Procedure: ANTERIOR CERVICAL DECOMPRESSION/DISCECTOMY FUSION CERVICAL FOUR- CERVICAL FIVE;  Surgeon: Ashok Pall, MD;  Location: [place];  Service: Neurosurgery;  Laterality: N/A;  ANTERIOR CERVICAL DECOMPRESSION/DISCECTOMY FUSION CERVICAL FOUR- CERVICAL FIVE  . AORTA - BILATERAL FEMORAL ARTERY BYPASS GRAFT N/A 11/20/2014   Procedure: AORTA BIFEMORAL BYPASS GRAFT;  Surgeon: Mal Misty, MD;  Location: [facility];  Service: Vascular;  Laterality: N/A;  . BACK SURGERY    . BRAIN SURGERY  1989   subdural hematoma  . CARDIAC CATHETERIZATION     Stent - 12  . CHOLECYSTECTOMY N/A 06/29/2017   Procedure: LAPAROSCOPIC CHOLECYSTECTOMY WITH INTRAOPERATIVE CHOLANGIOGRAM, POSSIBLE OPEN;  Surgeon: Robert Bellow, MD;  Location: [facility] ORS;  Service: General;  Laterality: N/A;  . CORONARY ANGIOPLASTY WITH STENT PLACEMENT  03/2007   to RCA  . CORONARY/GRAFT ACUTE MI REVASCULARIZATION N/A 03/07/2018   Procedure: Coronary/Graft Acute MI Revascularization;  Surgeon: Isaias Cowman, MD;  Location: [place] CV LAB;  Service: Cardiovascular;  Laterality: N/A;  . HERNIA REPAIR    . ILIAC ARTERY STENT  10/22/2012   "2" (10/22/2012)  . ILIAC ARTERY STENT  06/2012   left  . INCISIONAL HERNIA REPAIR N/A 11/08/2015   Procedure: INCISIONAL HERNIA REPAIR WITH MYOFASCIAL RELEASE;  Surgeon: Rolm Bookbinder, MD;  Location: [place];  Service: General;  Laterality: N/A;  . INSERTION OF MESH N/A 11/08/2015   Procedure: INSERTION OF MESH;  Surgeon: Rolm Bookbinder, MD;  Location: [place];  Service: General;  Laterality: N/A;  . LEFT HEART CATH AND CORONARY ANGIOGRAPHY N/A 03/07/2018   Procedure: LEFT HEART CATH AND CORONARY ANGIOGRAPHY;  Surgeon: Isaias Cowman, MD;  Location: [place] CV LAB;  Service: Cardiovascular;  Laterality: N/A;  . LEFT HEART CATHETERIZATION WITH CORONARY ANGIOGRAM N/A 05/29/2012   Procedure: LEFT HEART CATHETERIZATION WITH CORONARY ANGIOGRAM;  Surgeon: Wellington Hampshire, MD;  Location: [place] CATH LAB;  Service: Cardiovascular;  Laterality: N/A;  . LIVER BIOPSY  10/2012  . LOWER EXTREMITY ANGIOGRAM N/A 05/29/2012   Procedure: LOWER EXTREMITY ANGIOGRAM;  Surgeon: Wellington Hampshire, MD;  Location: [place] CATH LAB;  Service: Cardiovascular;  Laterality: N/A;  . MICROLARYNGOSCOPY WITH CO2 LASER AND EXCISION OF VOCAL CORD LESION N/A 11/21/2017   Procedure: MICROLARYNGOSCOPY WITH EXCISION OF VOCAL CORD LESION;  Surgeon: Jerrell Belfast, MD;  Location: [place];  Service: ENT;  Laterality: N/A;  . PERCUTANEOUS STENT INTERVENTION Left 05/29/2012   Procedure: PERCUTANEOUS STENT INTERVENTION;  Surgeon: Wellington Hampshire, MD;  [facility] CATH LAB;  Service: Cardiovascular;  Laterality: Left;  . POSTERIOR FUSION CERVICAL SPINE  2012  . SUBDURAL HEMATOMA EVACUATION VIA CRANIOTOMY      Prior to Admission medications   Medication Sig Start Date End Date Taking? Authorizing Provider  acetaminophen (TYLENOL) 500 MG tablet Take 1-2 tabs every 6 hours prn pain. Do not exceed 7 tablets daily. 12/24/17   Eppie Gibson, MD  aspirin 81 MG chewable tablet Chew 1 tablet (81 mg total) by mouth daily. 03/10/18   Saundra Shelling, MD  atorvastatin (LIPITOR) 40 MG tablet Take 1 tablet (40 mg total) by mouth daily at 6 PM. 03/09/18   Pyreddy, Reatha Harps, MD  carvedilol (COREG) 3.125 MG tablet Take 1 tablet (3.125 mg total) by mouth 2 (two) times daily with a meal. 03/09/18   Pyreddy, Reatha Harps, MD  divalproex (DEPAKOTE ER) 500 MG 24 hr tablet Take 1,500 mg by mouth daily.   06/17/17   [provider]  feeding supplement, ENSURE ENLIVE, (ENSURE ENLIVE) LIQD Take 237 mLs by mouth 2 (two) times daily between meals. 03/09/18   Saundra Shelling, MD  gabapentin (NEURONTIN) 300 MG capsule Take 1 capsule (300 mg total) by mouth 3 (three) times daily. For pain. 01/15/18   Eppie Gibson, MD  lithium carbonate (ESKALITH) 450 MG CR tablet Take 450 mg by mouth 2 (two) times daily.    [provider]  losartan (COZAAR) 50 MG tablet Take 1 tablet (50 mg total) by mouth daily. 03/10/18   Saundra Shelling, MD  nitroGLYCERIN (NITROSTAT) 0.4 MG SL tablet Place 1 tablet (0.4 mg total) under the tongue every 5 (five) minutes as needed for chest pain. 03/14/18 06/12/18  Theora Gianotti, NP  pantoprazole (PROTONIX) 40 MG tablet Take 40 mg by mouth daily at 2 PM. In the afternoon. 06/10/17   [provider]  ticagrelor (BRILINTA) 90 MG TABS tablet Take 1 tablet (90 mg total) by mouth 2 (two) times daily. 03/14/18   Theora Gianotti, NP    Allergies Lisinopril  Family History  Problem Relation Age of Onset  . Emphysema Father   . COPD Mother   . Diabetes Other   . Alcohol abuse Other   . Hypertension Other   . Hyperlipidemia Other   . Seizures Other     Social History Social History   Tobacco Use  . Smoking status: Current Every Day Smoker    Packs/day: 1.00    Years: 40.00    Pack years: 40.00    Types: Cigarettes  . Smokeless tobacco: Never Used  . Tobacco comment: "ive slowed down recently"   Substance Use Topics  . Alcohol use: Yes    Alcohol/week: 33.6 oz    Types: 56 Cans of beer per week  . Drug use: No    Review of Systems  Constitutional: Negative for fever. Eyes: Negative for visual changes. ENT: Negative for sore throat. Neck: No neck pain  Cardiovascular: + chest pain. Respiratory: + shortness of breath. Gastrointestinal: + abdominal pain, nausea, and vomiting. No diarrhea. Genitourinary: Negative for  dysuria. Musculoskeletal: Negative for back pain. Skin: Negative for rash. Neurological: Negative for headaches, weakness or numbness. Psych: No SI or HI  ____________________________________________   PHYSICAL EXAM:  VITAL SIGNS: ED Triage Vitals  Enc Vitals Group     BP 04/21/18 1607 139/74     Pulse Rate 04/21/18 1607 94     Resp 04/21/18 1607 16     Temp  04/21/18 1607 98.7 F (37.1 C)     Temp Source 04/21/18 1607 Oral     SpO2 04/21/18 1607 98 %     Weight 04/21/18 1608 130 lb (59 kg)     Height 04/21/18 1608 5\' 7"  (1.702 m)     Head Circumference --      Peak Flow --      Pain Score 04/21/18 1607 10     Pain Loc --      Pain Edu? --      Excl. in [place]? --     Constitutional: Alert and oriented, smells of cigarette and alcohol, actively dry heaving.  HEENT:      Head: Normocephalic and atraumatic.         Eyes: Conjunctivae are normal. Sclera is non-icteric.       Mouth/Throat: Mucous membranes are moist.       Neck: Supple with no signs of meningismus. Cardiovascular: Regular rate and rhythm. No murmurs, gallops, or rubs. 2+ symmetrical distal pulses are present in all extremities. No JVD. Respiratory: Normal respiratory effort. Lungs are clear to auscultation bilaterally. No wheezes, crackles, or rhonchi.  Gastrointestinal: Soft, tender to palpation on the epigastric region, and non distended with positive bowel sounds. No rebound or guarding. Musculoskeletal: Nontender with normal range of motion in all extremities. No edema, cyanosis, or erythema of extremities. Neurologic: Normal speech and language. Face is symmetric. Moving all extremities. No gross focal neurologic deficits are appreciated. Skin: Skin is warm, dry and intact. No rash noted. Psychiatric: Mood and affect are normal. Speech and behavior are normal.  ____________________________________________   LABS (all labs ordered are listed, but only abnormal results are displayed)  Labs Reviewed  CBC -  Abnormal; Notable for the following components:      Result Value   RDW 14.9 (*)    All other components within normal limits  ETHANOL - Abnormal; Notable for the following components:   Alcohol, Ethyl (B) 222 (*)    All other components within normal limits  URINE DRUG SCREEN, QUALITATIVE ([facility] ONLY) - Abnormal; Notable for the following components:   Opiate, Ur Screen POSITIVE (*)    All other components within normal limits  COMPREHENSIVE METABOLIC PANEL - Abnormal; Notable for the following components:   CO2 21 (*)    AST 62 (*)    ALT 93 (*)    All other components within normal limits  TROPONIN I - Abnormal; Notable for the following components:   Troponin I 0.03 (*)    All other components within normal limits  TROPONIN I  LIPASE, BLOOD  PROTIME-INR  APTT   ____________________________________________  EKG  ED ECG REPORT I, Rudene Re, the attending physician, personally viewed and interpreted this ECG.  Normal sinus rhythm, rate of 93, normal intervals, left axis deviation, LAFB, no ST elevations or depressions.  Unchanged from prior from a month ago  16:40 -normal sinus rhythm, rate of 90, normal intervals, left axis deviation, LAFB, no ST elevations or depressions.  Unchanged from initial. ____________________________________________  RADIOLOGY  I have personally reviewed the images performed during this visit and I agree with the Radiologist's read.   Interpretation by Radiologist:  Dg Chest Port 1 View  Result Date: 04/21/2018 CLINICAL DATA:  Chest pain EXAM: PORTABLE CHEST 1 VIEW COMPARISON:  12/17/2017 FINDINGS: Heart and mediastinal contours are within normal limits. No focal opacities or effusions. No acute bony abnormality. Old healed left rib fractures. IMPRESSION: No active disease. Electronically Signed  By: Rolm Baptise M.D.   On: 04/21/2018 16:36     ____________________________________________   PROCEDURES  Procedure(s) performed:  None Procedures Critical Care performed:  None ____________________________________________   INITIAL IMPRESSION / ASSESSMENT AND PLAN / ED COURSE   57 y.o. male with a history of alcohol abuse, smoking, CAD status post CABG and stents and a recent STEMI on April 2019, hepatitis C, hypertension, hyperlipidemia, ischemic cardiomyopathy who presents for evaluation of chest pain radiating to the L arm, epigastric pain, nausea, vomiting, diaphoresis, and SOB all of sudden onset 5 hours ago.  Patient smells strongly of cigarettes and alcohol, he is actively dry heaving.  His initial EKG shows no evidence of ischemia.  Repeat EKG done 30 min from initial EKG also showing no ischemic changes.  He is tender to palpation on the epigastric region.  Differential diagnosis is broad and includes but not limited to ACS, pancreatitis, alcoholic gastritis, peptic ulcer disease, dissection. Will give ASA, zofran, morphine, monitor closely on telemetry. Will check CXR, CBC, CMP, lipase, alcohol level.     _________________________ 10:06 PM on 04/21/2018 -----------------------------------------  Labs showing normal CBC, CMP, lipase.  First troponin was undetectable and second was 0.03 however with pain and a 4-hour interval between the 2 measurements I would expect if this was ACS that his troponin would be much higher than this.  I recommended admission since patient is high risk, for chest pain rule out however patient tells me "I am an alcoholic and I need to drink so I need to go home right now".  He will follow-up with his cardiologist.  He will return to the emergency room if his pain gets worse.   As part of my medical decision making, I reviewed the following data within the [place] notes reviewed and incorporated, Labs reviewed , EKG interpreted , Old EKG reviewed, Old chart reviewed, Radiograph reviewed , Notes from prior ED visits and [place] Controlled Substance  Database    Pertinent labs & imaging results that were available during my care of the patient were reviewed by me and considered in my medical decision making (see chart for details).    ____________________________________________   FINAL CLINICAL IMPRESSION(S) / ED DIAGNOSES  Final diagnoses:  Chest pain, unspecified type  Epigastric pain  Alcohol abuse with intoxication ([place])      NEW MEDICATIONS STARTED DURING THIS VISIT:  ED Discharge Orders    None       Note:  This document was prepared using Dragon voice recognition software and may include unintentional dictation errors.    Rudene Re, MD 04/21/18 2207

## 2018-04-21 NOTE — ED Notes (Signed)
Patient transported to X-ray 

## 2018-04-21 NOTE — ED Notes (Signed)
Pt requesting to talk to MD. States does not want any more Nitro because it gives him a headache. Reports abd cramping and left arm pain.

## 2018-05-29 ENCOUNTER — Encounter: Payer: Self-pay | Admitting: Radiation Oncology

## 2018-05-29 NOTE — Progress Notes (Signed)
Mr. Ewing presents for follow up of radiation completed 01/24/18 to his Larynx.    Pain issues, if any: Yes, he reports pain to his throat a 8/10 constantly Using a feeding tube?: No Weight changes, if any:  Wt Readings from Last 3 Encounters:  05/31/18 139 lb 3.2 oz (63.1 kg)  04/21/18 130 lb (59 kg)  03/14/18 141 lb 12 oz (64.3 kg)   Swallowing issues, if any: He reports a decreased appetite. He reports food gets stuck when he swallows. He will vomit often when swallowing. He is eating all types of food per his report, but has difficulty chewing due to loss of teeth.  Smoking or chewing tobacco? Yes, he is smoking "a lot" everyday. He tells me over 1 pack daily. He is drinking " a lot" everyday. He drinks liquor and beer.  Using fluoride trays daily? N/A Last ENT visit was on: Dr. Wilburn Cornelia 02/19/18 with Laryngoscopy.  Other notable issues, if any:  Admitted to hospital 03/07/18 after suffering an MI He is not feeling well, and feels like his cancer may be back. He reports spitting and coughing up blood daily. He feels like the blood is coming from his throat.   BP (!) 169/96 (BP Location: Right Arm, Patient Position: Sitting, Cuff Size: Normal)   Pulse 93   Temp 98.4 F (36.9 C) (Oral)   Resp 20   Wt 139 lb 3.2 oz (63.1 kg)   SpO2 98%   BMI 21.80 kg/m   His blood pressure is high today. He tells me that he stopped taking his losarten due to a recall one week ago. He will see his cardiologist in 2 weeks.

## 2018-05-31 ENCOUNTER — Ambulatory Visit
Admission: RE | Admit: 2018-05-31 | Discharge: 2018-05-31 | Disposition: A | Discharge status: Home / Self Care | Payer: Medicaid Other | Source: Ambulatory Visit | Attending: Radiation Oncology | Admitting: Radiation Oncology

## 2018-05-31 ENCOUNTER — Encounter: Payer: Self-pay | Admitting: *Deleted

## 2018-05-31 ENCOUNTER — Other Ambulatory Visit: Payer: Self-pay

## 2018-05-31 VITALS — BP 169/96 | HR 93 | Temp 98.4°F | Resp 20 | Wt 139.2 lb

## 2018-05-31 DIAGNOSIS — C320 Malignant neoplasm of glottis: Secondary | ICD-10-CM | POA: Diagnosis present

## 2018-05-31 DIAGNOSIS — Z888 Allergy status to other drugs, medicaments and biological substances status: Secondary | ICD-10-CM | POA: Insufficient documentation

## 2018-05-31 DIAGNOSIS — I252 Old myocardial infarction: Secondary | ICD-10-CM | POA: Diagnosis not present

## 2018-05-31 DIAGNOSIS — Z7982 Long term (current) use of aspirin: Secondary | ICD-10-CM | POA: Diagnosis not present

## 2018-05-31 DIAGNOSIS — Z08 Encounter for follow-up examination after completed treatment for malignant neoplasm: Secondary | ICD-10-CM | POA: Diagnosis not present

## 2018-05-31 DIAGNOSIS — Z79899 Other long term (current) drug therapy: Secondary | ICD-10-CM | POA: Diagnosis not present

## 2018-05-31 DIAGNOSIS — F17210 Nicotine dependence, cigarettes, uncomplicated: Secondary | ICD-10-CM | POA: Insufficient documentation

## 2018-05-31 DIAGNOSIS — Z8521 Personal history of malignant neoplasm of larynx: Secondary | ICD-10-CM | POA: Insufficient documentation

## 2018-05-31 DIAGNOSIS — Z7289 Other problems related to lifestyle: Secondary | ICD-10-CM | POA: Diagnosis not present

## 2018-05-31 DIAGNOSIS — R042 Hemoptysis: Secondary | ICD-10-CM | POA: Insufficient documentation

## 2018-05-31 DIAGNOSIS — F419 Anxiety disorder, unspecified: Secondary | ICD-10-CM | POA: Diagnosis not present

## 2018-05-31 DIAGNOSIS — Z923 Personal history of irradiation: Secondary | ICD-10-CM | POA: Diagnosis not present

## 2018-05-31 HISTORY — DX: Personal history of irradiation: Z92.3

## 2018-05-31 MED ORDER — LARYNGOSCOPY SOLUTION RAD-ONC
15.0000 mL | Freq: Once | TOPICAL | Status: AC
  Administered 2018-05-31: 15 mL via TOPICAL
  Filled 2018-05-31: qty 15

## 2018-05-31 NOTE — Progress Notes (Signed)
Radiation Oncology         [phone]) [phone] ________________________________  Name: [name] MRN: [id number]  Date: 05/31/2018  DOB: [birth year]  Follow-Up Visit Note  CC: Gayland Curry, MD  Jerrell Belfast, MD  Diagnosis and Prior Radiotherapy:       ICD-10-CM   1. Malignant neoplasm of glottis ([place]) C32.0 laryngocopy solution for Rad-Onc    Fiberoptic laryngoscopy    Malignant neoplasm of glottis ([place]) Staging form: Larynx - Glottis, AJCC 8th Edition - Clinical: Stage I (cT1b, cN0, cM0) - Signed by Eppie Gibson, MD on 12/11/2017  Radiation treatment dates:   12/17/2017 - 01/24/2018 Site/dose:   Larynx treated to 63 Gy in 28 fractions of 2.25 Gy  CHIEF COMPLAINT:  Here for follow-up and surveillance of head and neck cancer  Narrative:  The patient returns today for routine follow-upUsing a feeding tube?: No Weight changes, if any:     Wt Readings from Last 3 Encounters:  05/31/18 139 lb 3.2 oz (63.1 kg)  04/21/18 130 lb (59 kg)  03/14/18 141 lb 12 oz (64.3 kg)    Swallowing issues, if any: He reports a decreased appetite. He reports food gets stuck when he swallows. He will vomit often when swallowing. He is eating all types of food per his report, but has difficulty chewing due to loss of teeth.  Smoking or chewing tobacco? Yes, he is smoking "a lot" everyday. He tells me over 1 pack daily. He is drinking " a lot" everyday. He drinks liquor and beer.  Using fluoride trays daily? N/A Last ENT visit was on: Dr. Wilburn Cornelia 02/19/18 with Laryngoscopy.  Other notable issues, if any:  Admitted to hospital 03/07/18 after suffering an MI He is not feeling well, and feels like his cancer may be back. He reports spitting and coughing up blood daily for the past 2 weeks. He feels like the blood is coming from his throat.  He reports his voice comes and goes.  He reports some tinnitus. He notes increased anxiety and is interested in returning to his former psychiatrist. He reports  he has difficulty returning to his psychiatrist because they do not take his insurance.                    ALLERGIES:  is allergic to lisinopril.  Meds: Current Outpatient Medications  Medication Sig Dispense Refill  . acetaminophen (TYLENOL) 500 MG tablet Take 1-2 tabs every 6 hours prn pain. Do not exceed 7 tablets daily. 120 tablet 1  . aspirin 81 MG chewable tablet Chew 1 tablet (81 mg total) by mouth daily. 30 tablet 1  . atorvastatin (LIPITOR) 40 MG tablet Take 1 tablet (40 mg total) by mouth daily at 6 PM. 30 tablet 1  . carvedilol (COREG) 3.125 MG tablet Take 1 tablet (3.125 mg total) by mouth 2 (two) times daily with a meal. 60 tablet 1  . divalproex (DEPAKOTE ER) 500 MG 24 hr tablet Take 1,500 mg by mouth daily.   2  . feeding supplement, ENSURE ENLIVE, (ENSURE ENLIVE) LIQD Take 237 mLs by mouth 2 (two) times daily between meals. 237 mL 12  . gabapentin (NEURONTIN) 300 MG capsule Take 1 capsule (300 mg total) by mouth 3 (three) times daily. For pain. 90 capsule 0  . lithium carbonate (ESKALITH) 450 MG CR tablet Take 450 mg by mouth 2 (two) times daily.    . nitroGLYCERIN (NITROSTAT) 0.4 MG SL tablet Place 1 tablet (0.4 mg total) under the  tongue every 5 (five) minutes as needed for chest pain. 25 tablet 3  . pantoprazole (PROTONIX) 40 MG tablet Take 40 mg by mouth daily at 2 PM. In the afternoon.  3  . ticagrelor (BRILINTA) 90 MG TABS tablet Take 1 tablet (90 mg total) by mouth 2 (two) times daily. 180 tablet 3  . losartan (COZAAR) 50 MG tablet Take 1 tablet (50 mg total) by mouth daily. (Patient not taking: Reported on 05/31/2018) 30 tablet 1   No current facility-administered medications for this encounter.     Physical Findings: Wt Readings from Last 3 Encounters:  05/31/18 139 lb 3.2 oz (63.1 kg)  04/21/18 130 lb (59 kg)  03/14/18 141 lb 12 oz (64.3 kg)    weight is 139 lb 3.2 oz (63.1 kg). His oral temperature is 98.4 F (36.9 C). His blood pressure is 169/96 (abnormal)  and his pulse is 93. His respiration is 20 and oxygen saturation is 98%.    General: Alert and oriented, in no acute distress HEENT: Head is normocephalic. Extraocular movements are intact. Oropharynx is clear.  Neck: Neck is supple, no palpable cervical or supraclavicular lymphadenopathy. Heart: Regular in rate and rhythm with no murmurs, rubs, or gallops. Chest: Clear to auscultation bilaterally, with no rhonchi, wheezes, or rales. Psychiatric:  Affect is appropriate.  PROCEDURE NOTE: After obtaining consent and anesthetizing the nasal cavity with topical lidocaine and phenylephrine, the flexible endoscope was introduced and passed through the nasal cavity. Vocal cords are symmetrically mobile and without nodules or lesions. Remainder of pharynx and larynx show no masses or active bleeding.   Lab Findings: Lab Results  Component Value Date   WBC 8.4 04/21/2018   HGB 15.2 04/21/2018   HCT 44.4 04/21/2018   MCV 93.7 04/21/2018   PLT 215 04/21/2018    Lab Results  Component Value Date   TSH 1.098 12/17/2017    Radiographic Findings: No results found.  Impression/Plan:    1) Head and Neck Cancer Status: NED.    2) Nutritional Status: stable weight  3) Risk Factors: The patient has been educated about risk factors including alcohol and tobacco abuse; they understand that avoidance of alcohol and tobacco is important to prevent recurrences as well as other cancers - still smoking and alcohol use*  4) Swallowing: He has odynophagia but is able to eat most foods. Follow w/ SLP as directed  5) Thyroid function:  Check annually Lab Results  Component Value Date   TSH 1.098 12/17/2017    6) ENT: Follow up with Dr. Wilburn Cornelia in one month with a laryngoscopy. Gayleen Orem, RN, our Head and Neck Oncology Navigator will schedule this appointment.  7) Follow-up in four months.  The patient was encouraged to call with any issues or questions before then.  8) Liliane Channel will navigate with  the patient's psychiatrist to set up an appointment.   *Patient shows no motivation to stop smoking or ETOH abuse - admits to self-medicating. Needs psychiatric support.  _____________________________________   Eppie Gibson, MD  This document serves as a record of services personally performed by Eppie Gibson, MD. It was created on her behalf by Bethann Humble, a trained medical scribe. The creation of this record is based on the scribe's personal observations and the provider's statements to them. This document has been checked and approved by the attending provider.

## 2018-05-31 NOTE — Progress Notes (Signed)
Oncology Nurse Navigator Documentation  Met with pt during post-RT follow-up with Dr. Isidore Moos.  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 He voiced understanding I will coordinate follow-up with current psychiatrist and ENT [facility]. I encouraged him to call me with needs/concerns.  He voiced understanding.  Gayleen Orem, RN, BSN Head & Neck Oncology Nurse [place] at [place] [phone]

## 2018-06-03 ENCOUNTER — Encounter: Payer: Self-pay | Admitting: Radiation Oncology

## 2018-06-13 ENCOUNTER — Encounter: Payer: Self-pay | Admitting: Cardiovascular Disease

## 2018-06-13 ENCOUNTER — Ambulatory Visit (INDEPENDENT_AMBULATORY_CARE_PROVIDER_SITE_OTHER): Payer: Medicaid Other | Admitting: Cardiovascular Disease

## 2018-06-13 ENCOUNTER — Telehealth: Payer: Self-pay | Admitting: *Deleted

## 2018-06-13 VITALS — BP 140/98 | HR 56 | Ht 67.0 in | Wt 137.2 lb

## 2018-06-13 DIAGNOSIS — I739 Peripheral vascular disease, unspecified: Secondary | ICD-10-CM

## 2018-06-13 DIAGNOSIS — I2510 Atherosclerotic heart disease of native coronary artery without angina pectoris: Secondary | ICD-10-CM | POA: Diagnosis not present

## 2018-06-13 DIAGNOSIS — Z720 Tobacco use: Secondary | ICD-10-CM | POA: Diagnosis not present

## 2018-06-13 DIAGNOSIS — I10 Essential (primary) hypertension: Secondary | ICD-10-CM

## 2018-06-13 DIAGNOSIS — I255 Ischemic cardiomyopathy: Secondary | ICD-10-CM

## 2018-06-13 MED ORDER — LOSARTAN POTASSIUM 50 MG PO TABS: 50.0000 mg | ORAL_TABLET | Freq: Every day | ORAL | 2 refills | Status: DC

## 2018-06-13 NOTE — Progress Notes (Signed)
Cardiology Office Note   Date:  06/14/2018   ID:  [name], DOB [birth year], MRN [id number]  PCP:  Gayland Curry, MD  Cardiologist:   Kathlyn Sacramento, MD   Chief Complaint  Patient presents with  . other    [facility] Chest pain. Meds reviewed verbally with pt.      History of Present Illness: [name] is a 57 y.o. male who presents for a followup visit regarding PAD and coronary artery disease. He has a history of coronary artery disease, stent to his RCA in April 2008 with occluded circumflex, previous bilateral iliac stenting and most recently aortobifemoral bypass in December 2015 by Dr. Kellie Simmering , mild to moderate renal artery stenosis on the left, history of hepatitis C, long alcohol history currently in remission, long history of smoking , hypertension, bipolar disorder , laryngeal cancer and hyperlipidemia. He is known to have chronically occluded left circumflex.  He presented in early April with inferior ST elevation myocardial infarction.  He was found to have an occluded RCA stent.  This was successfully treated with PCI and another drug-eluting stent placement.  Ejection fraction was 40 to 45%.  Unfortunately, he continues to smoke and reports inability to quit.  He also drinks at least 12 beers daily.  He reports that he is an alcoholic and cannot quit.  He has attended rehab multiple times in the past. He reports mentions chronic substernal chest pain described as aching sensation.  This can happen at rest or with exertion.   Past Medical History:  Diagnosis Date  . Anxiety   . Bipolar affective ([place])    takes Lithium daily  . Broken fingers   . Coronary artery disease    a. 1997 PCI: RCA ([facility]); b. 2008 PCI: RCA; c. 05/2012 Cath: LCx 100 (previously stented - ? date), RCA stent w/ mild ISR, LAD nonobs, EF 35%; d. 06/2013 Cath: stable anatomy; e. 03/2018 Inf STEMI/PCI: LCX 100o/p ISR (CTO), RCA 161m/d (3.0x38 Resolute Onyx DES), EF 40-45%.   . Depression    . GERD (gastroesophageal reflux disease)    takes Protonix daily  . Hepatitis C    "diagnosed in the past 2 wk" (10/22/2012)  . History of ETOH abuse    quit 2007  . History of radiation therapy 12/17/17- 01/24/18   Larynx treated to 63 gy in 28 fx of 2.25 Gy  . Hyperlipidemia   . Hypertension    "pt. denies - no longer taking blood pressure medications"  . Ischemic cardiomyopathy    a. 03/2018 LV gram: EF 40-45% @ time of MI.  Marland Kitchen Myocardial infarction ([place])    x 4  . PAD (peripheral artery disease) ([place])    a. 2010 PTA & stent to left CIA & left SFA; b. 05/2012 PTA & stent to distal LCIA; c. 10/2012 thrombectomy and stents to [place]; d. 11/2014 s/p Ao-Bifem bypass.  . Schizophrenia ([place])   . Seizures ([place])    "when I get anxious" (Stated last seizure three months ago)  . Tobacco abuse   . Urinary frequency     Past Surgical History:  Procedure Laterality Date  . ABDOMINAL AORTAGRAM N/A 10/22/2012   Procedure: ABDOMINAL Maxcine Ham;  Surgeon: Wellington Hampshire, MD;  Location: [place] CATH LAB;  Service: Cardiovascular;  Laterality: N/A;  . ABDOMINAL AORTAGRAM N/A 11/11/2014   Procedure: ABDOMINAL Maxcine Ham;  Surgeon: Wellington Hampshire, MD;  Location: [place] CATH LAB;  Service: Cardiovascular;  Laterality: N/A;  .  ANTERIOR CERVICAL DECOMP/DISCECTOMY FUSION  01/2011   plate & screw placement   . ANTERIOR CERVICAL DECOMP/DISCECTOMY FUSION N/A 01/26/2017   Procedure: ANTERIOR CERVICAL DECOMPRESSION/DISCECTOMY FUSION CERVICAL FOUR- CERVICAL FIVE;  Surgeon: Ashok Pall, MD;  Location: [place];  Service: Neurosurgery;  Laterality: N/A;  ANTERIOR CERVICAL DECOMPRESSION/DISCECTOMY FUSION CERVICAL FOUR- CERVICAL FIVE  . AORTA - BILATERAL FEMORAL ARTERY BYPASS GRAFT N/A 11/20/2014   Procedure: AORTA BIFEMORAL BYPASS GRAFT;  Surgeon: Mal Misty, MD;  Location: [facility];  Service: Vascular;  Laterality: N/A;  . BACK SURGERY    . BRAIN SURGERY  1989   subdural hematoma  . CARDIAC CATHETERIZATION      Stent - 12  . CHOLECYSTECTOMY N/A 06/29/2017   Procedure: LAPAROSCOPIC CHOLECYSTECTOMY WITH INTRAOPERATIVE CHOLANGIOGRAM, POSSIBLE OPEN;  Surgeon: Robert Bellow, MD;  Location: [facility] ORS;  Service: General;  Laterality: N/A;  . CORONARY ANGIOPLASTY WITH STENT PLACEMENT  03/2007   to RCA  . CORONARY/GRAFT ACUTE MI REVASCULARIZATION N/A 03/07/2018   Procedure: Coronary/Graft Acute MI Revascularization;  Surgeon: Isaias Cowman, MD;  Location: [place] CV LAB;  Service: Cardiovascular;  Laterality: N/A;  . HERNIA REPAIR    . ILIAC ARTERY STENT  10/22/2012   "2" (10/22/2012)  . ILIAC ARTERY STENT  06/2012   left  . INCISIONAL HERNIA REPAIR N/A 11/08/2015   Procedure: INCISIONAL HERNIA REPAIR WITH MYOFASCIAL RELEASE;  Surgeon: Rolm Bookbinder, MD;  Location: [place] [facility];  Service: General;  Laterality: N/A;  . INSERTION OF MESH N/A 11/08/2015   Procedure: INSERTION OF MESH;  Surgeon: Rolm Bookbinder, MD;  Location: [facility];  Service: General;  Laterality: N/A;  . LEFT HEART CATH AND CORONARY ANGIOGRAPHY N/A 03/07/2018   Procedure: LEFT HEART CATH AND CORONARY ANGIOGRAPHY;  Surgeon: Isaias Cowman, MD;  Location: [place] CV LAB;  Service: Cardiovascular;  Laterality: N/A;  . LEFT HEART CATHETERIZATION WITH CORONARY ANGIOGRAM N/A 05/29/2012   Procedure: LEFT HEART CATHETERIZATION WITH CORONARY ANGIOGRAM;  Surgeon: Wellington Hampshire, MD;  Location: [place] CATH LAB;  Service: Cardiovascular;  Laterality: N/A;  . LIVER BIOPSY  10/2012  . LOWER EXTREMITY ANGIOGRAM N/A 05/29/2012   Procedure: LOWER EXTREMITY ANGIOGRAM;  Surgeon: Wellington Hampshire, MD;  Location: [place] CATH LAB;  Service: Cardiovascular;  Laterality: N/A;  . MICROLARYNGOSCOPY WITH CO2 LASER AND EXCISION OF VOCAL CORD LESION N/A 11/21/2017   Procedure: MICROLARYNGOSCOPY WITH EXCISION OF VOCAL CORD LESION;  Surgeon: Jerrell Belfast, MD;  Location: [place];  Service: ENT;  Laterality: N/A;  . PERCUTANEOUS STENT INTERVENTION Left 05/29/2012    Procedure: PERCUTANEOUS STENT INTERVENTION;  Surgeon: Wellington Hampshire, MD;  Location: [place] CATH LAB;  Service: Cardiovascular;  Laterality: Left;  . POSTERIOR FUSION CERVICAL SPINE  2012  . SUBDURAL HEMATOMA EVACUATION VIA CRANIOTOMY       Current Outpatient Medications  Medication Sig Dispense Refill  . acetaminophen (TYLENOL) 500 MG tablet Take 1-2 tabs every 6 hours prn pain. Do not exceed 7 tablets daily. 120 tablet 1  . aspirin 81 MG chewable tablet Chew 1 tablet (81 mg total) by mouth daily. 30 tablet 1  . atorvastatin (LIPITOR) 40 MG tablet Take 1 tablet (40 mg total) by mouth daily at 6 PM. 30 tablet 1  . carvedilol (COREG) 6.25 MG tablet Take 0.5 mg by mouth 2 (two) times daily with a meal.    . divalproex (DEPAKOTE ER) 500 MG 24 hr tablet Take 1,500 mg by mouth daily.   2  . lithium carbonate (ESKALITH) 450 MG CR  tablet Take 450 mg by mouth 2 (two) times daily.    . nitroGLYCERIN (NITROSTAT) 0.4 MG SL tablet Place 1 tablet (0.4 mg total) under the tongue every 5 (five) minutes as needed for chest pain. 25 tablet 3  . pantoprazole (PROTONIX) 40 MG tablet Take 40 mg by mouth daily at 2 PM. In the afternoon.  3  . ticagrelor (BRILINTA) 90 MG TABS tablet Take 1 tablet (90 mg total) by mouth 2 (two) times daily. 180 tablet 3  . losartan (COZAAR) 50 MG tablet Take 1 tablet (50 mg total) by mouth daily. 90 tablet 2   No current facility-administered medications for this visit.     Allergies:   Lisinopril    Social History:  The patient  reports that he has been smoking cigarettes.  He has a 40.00 pack-year smoking history. He has never used smokeless tobacco. He reports that he drinks about 33.6 oz of alcohol per week. He reports that he does not use drugs.   Family History:  The patient's family history includes Alcohol abuse in his other; COPD in his mother; Diabetes in his other; Emphysema in his father; Hyperlipidemia in his other; Hypertension in his other; Seizures in his other.     ROS:  Please see the history of present illness.   Otherwise, review of systems are positive for none.   All other systems are reviewed and negative.    PHYSICAL EXAM: VS:  BP (!) 140/98 (BP Location: Left Arm, Patient Position: Sitting, Cuff Size: Normal)   Pulse (!) 56   Ht 5\' 7"  (1.702 m)   Wt 137 lb 4 oz (62.3 kg)   BMI 21.50 kg/m  , BMI Body mass index is 21.5 kg/m. GEN: Well nourished, well developed, in no acute distress  HEENT: normal  Neck: no JVD, carotid bruits, or masses Cardiac: RRR; no murmurs, rubs, or gallops,no edema  Respiratory:  clear to auscultation bilaterally, normal work of breathing GI: soft, nontender, nondistended, + BS MS: no deformity or atrophy  Skin: warm and dry, no rash Neuro:  Strength and sensation are intact Psych: euthymic mood, full affect  EKG:  EKG is ordered today. The ekg ordered today demonstrates sinus bradycardia with evidence of old septal infarct.  Recent Labs: 12/17/2017: TSH 1.098 04/21/2018: ALT 93; BUN 6; Creatinine, Ser 0.69; Hemoglobin 15.2; Platelets 215; Potassium 4.2; Sodium 138    Lipid Panel    Component Value Date/Time   CHOL 118 03/07/2018 1604   CHOL 154 09/15/2013 0941   TRIG 61 03/07/2018 1604   HDL 36 (L) 03/07/2018 1604   HDL 54 09/15/2013 0941   CHOLHDL 3.3 03/07/2018 1604   VLDL 12 03/07/2018 1604   LDLCALC 70 03/07/2018 1604   LDLCALC 82 09/15/2013 0941      Wt Readings from Last 3 Encounters:  06/13/18 137 lb 4 oz (62.3 kg)  05/31/18 139 lb 3.2 oz (63.1 kg)  04/21/18 130 lb (59 kg)      No flowsheet data found.    ASSESSMENT AND PLAN:  1.   Coronary artery disease involving native coronary arteries without angina: He has atypical chest pain that does not seem to be anginal.  Recommend dual antiplatelet therapy indefinitely.  We can decrease Brilinta to 60 mg twice daily after 1 year of his myocardial infarction.  2. Peripheral arterial disease: Status post aortobifemoral bypass.  Followed by [facility].  Normal Doppler in October 2017.  3. Essential hypertension: Blood pressure is elevated.  He ran  out of losartan which was refilled today.  4. Hyperlipidemia:  Continue atorvastatin with a target LDL of less than 70.  5. Tobacco use: Unfortunately, he reports inability to quit.  6.  Excessive alcohol use: He also reports inability to quit.  He has attended rehab multiple times in the past.   Disposition:   FU with me in 4 months  Signed,  Kathlyn Sacramento, MD  06/14/2018 10:25 AM    [place]

## 2018-06-13 NOTE — Telephone Encounter (Signed)
Oncology Nurse Navigator Documentation  Spoke with Lavonda Jumbo ENT, requested follow-up appt with Dr. Wilburn Cornelia late July/early August.  She voiced understanding.  Gayleen Orem, RN, BSN Head & Neck Oncology Nurse [place] at [place] [phone]

## 2018-06-13 NOTE — Patient Instructions (Signed)
Medication Instructions:  Your physician recommends that you continue on your current medications as directed. Please refer to the Current Medication list given to you today.   Labwork: NONE  Testing/Procedures: NONE  Follow-Up: Your physician recommends that you schedule a follow-up appointment in: [place].   If you need a refill on your cardiac medications before your next appointment, please call your pharmacy.

## 2018-06-19 ENCOUNTER — Telehealth: Payer: Self-pay | Admitting: *Deleted

## 2018-06-19 NOTE — Telephone Encounter (Signed)
Oncology Nurse Navigator Documentation  With information provided by pt, called psychiatrist Cristal Generous office [phone]), spoke with Tiffany.  She confirmed [name] was last seen May 2018, suggested though couldn't confirm, he has not been seen because his Medicaid coverage had changed.  She noted Medicaid [name] is in-network, Medicaid [name] is out-of-network though she saw no record of his coverage.  She suggested he call his Medicaid office to check on availability of providers if he has Medicaid [name].  Again spoke with [name], he indicated he has Medicaid [name].  I relayed above information.  Spoke with friend [name] who indicated he is investigating coverage with [name].  I asked him to call Dr. Pecola Leisure office to check on [facility] coverage.  He agreed to call me with update.  Philip Orem, RN, BSN Head & Neck Oncology Nurse [place] at [place] [phone]

## 2018-06-24 ENCOUNTER — Telehealth: Payer: Self-pay | Admitting: *Deleted

## 2018-06-24 NOTE — Telephone Encounter (Signed)
Oncology Nurse Navigator Documentation  Spoke with pt's friend/caregiver Lewis in follow-up to VMM rec'd from RN [facility].  He indicated Wilfred had re-evaluation today for an appointment tomorrow with his psychiatrist Dr. Rosine Door.  He further noted he will be enrolling for Medicaid with [employer] or [place] effective November 1 which will reinstate his in-network status with Dr. Rosine Door.  Gayleen Orem, RN, BSN Head & Neck Oncology Nurse [place] at [place] [phone]

## 2018-09-19 NOTE — Progress Notes (Signed)
Mr. Clapper presents for follow up of radiation to his Larynx 01/24/18  Pain issues, if any: He denies Using a feeding tube?: No Weight changes, if any:  Wt Readings from Last 3 Encounters:  09/27/18 146 lb 12.8 oz (66.6 kg)  06/13/18 137 lb 4 oz (62.3 kg)  05/31/18 139 lb 3.2 oz (63.1 kg)   Swallowing issues, if any: yes, food will get stuck in his throat occasionally. He tells me he is a fast eater and does not always chew his food well. He does tell me that the end of September he had increased difficulty swallowing food for 3 weeks and got choked frequently.  Smoking or chewing tobacco? He is smoking 1 pack per day. He does tell me today that he quit drinking alcohol 2 months ago, when he started seeing his psychiatrist.  Using fluoride trays daily? No Last ENT visit was on: Dr. Wilburn Cornelia 02/19/18.  Other notable issues, if any:     BP 128/73 (BP Location: Right Arm)   Pulse (!) 56   Temp 97.7 F (36.5 C) (Oral)   Wt 146 lb 12.8 oz (66.6 kg)   SpO2 100%   BMI 22.99 kg/m

## 2018-09-27 ENCOUNTER — Ambulatory Visit
Admission: RE | Admit: 2018-09-27 | Discharge: 2018-09-27 | Disposition: A | Discharge status: Home / Self Care | Payer: Medicaid Other | Source: Ambulatory Visit | Attending: Radiation Oncology | Admitting: Radiation Oncology

## 2018-09-27 ENCOUNTER — Encounter: Payer: Self-pay | Admitting: Radiation Oncology

## 2018-09-27 ENCOUNTER — Other Ambulatory Visit: Payer: Self-pay

## 2018-09-27 VITALS — BP 128/73 | HR 56 | Temp 97.7°F | Wt 146.8 lb

## 2018-09-27 DIAGNOSIS — Z888 Allergy status to other drugs, medicaments and biological substances status: Secondary | ICD-10-CM | POA: Insufficient documentation

## 2018-09-27 DIAGNOSIS — Z79899 Other long term (current) drug therapy: Secondary | ICD-10-CM | POA: Diagnosis not present

## 2018-09-27 DIAGNOSIS — F17210 Nicotine dependence, cigarettes, uncomplicated: Secondary | ICD-10-CM | POA: Diagnosis not present

## 2018-09-27 DIAGNOSIS — Z923 Personal history of irradiation: Secondary | ICD-10-CM | POA: Diagnosis not present

## 2018-09-27 DIAGNOSIS — C320 Malignant neoplasm of glottis: Secondary | ICD-10-CM | POA: Diagnosis present

## 2018-09-27 DIAGNOSIS — Z7982 Long term (current) use of aspirin: Secondary | ICD-10-CM | POA: Diagnosis not present

## 2018-09-27 MED ORDER — LARYNGOSCOPY SOLUTION RAD-ONC
15.0000 mL | Freq: Once | TOPICAL | Status: AC
  Administered 2018-09-27: 15 mL via TOPICAL
  Filled 2018-09-27: qty 15

## 2018-09-27 NOTE — Progress Notes (Signed)
Radiation Oncology         [phone] ________________________________  Name: Philip BILTON MRN: [id number]  Date: 09/27/2018  DOB: 02-17-1961  Follow-Up Visit Note  CC: Gayland Curry, MD  Jerrell Belfast, MD  Diagnosis and Prior Radiotherapy:       ICD-10-CM   1. Malignant neoplasm of glottis ([place]) C32.0 laryngocopy solution for Rad-Onc    Fiberoptic laryngoscopy    Malignant neoplasm of glottis ([place]) Staging form: Larynx - Glottis, AJCC 8th Edition - Clinical: Stage I (cT1b, cN0, cM0) - Signed by Eppie Gibson, MD on 12/11/2017  Radiation treatment dates:   12/17/2017 - 01/24/2018 Site/dose:   Larynx treated to 63 Gy in 28 fractions of 2.25 Gy  CHIEF COMPLAINT:  Here for follow-up and surveillance of head and neck cancer  Narrative:  The patient returns today for routine follow-up of radiation completed 01/24/18 to his larynx. Last saw ENT Dr. Wilburn Cornelia on 02/19/18.  Patient was last seen by his PCP on 09/11/18. I have reviewed this note and accompanying lab results, including TSH of 4.89.  He is unaccompanied today. He denies any pain issues. He does not have a feeding tube. He has been experiencing swallowing issues: Food will get stuck in his throat occasionally, further stating he is a fast eater and does not always chew his food well. He reports that the end of September he had increased difficulty swallowing food for 3 weeks and got chokes frequently. He is smoking 1 pack per day. He quit drinking alcohol 2 months ago when he started seeing his psychiatrist.   ALLERGIES:  is allergic to lisinopril.  Meds: Current Outpatient Medications  Medication Sig Dispense Refill  . aspirin 81 MG chewable tablet Chew 1 tablet (81 mg total) by mouth daily. 30 tablet 1  . atorvastatin (LIPITOR) 40 MG tablet Take 1 tablet (40 mg total) by mouth daily at 6 PM. 30 tablet 1  . carvedilol (COREG) 6.25 MG tablet Take 0.5 mg by mouth 2 (two) times daily with a meal.    . clonazePAM  (KLONOPIN) 1 MG tablet 1 mg. Pt reports that his MD increased it to four times daily.    . divalproex (DEPAKOTE ER) 500 MG 24 hr tablet Take 1,500 mg by mouth daily.   2  . lithium carbonate (ESKALITH) 450 MG CR tablet Take 450 mg by mouth 2 (two) times daily.    Marland Kitchen losartan (COZAAR) 50 MG tablet Take 1 tablet (50 mg total) by mouth daily. 90 tablet 2  . pantoprazole (PROTONIX) 40 MG tablet Take 40 mg by mouth daily at 2 PM. In the afternoon.  3  . ticagrelor (BRILINTA) 90 MG TABS tablet Take 1 tablet (90 mg total) by mouth 2 (two) times daily. 180 tablet 3  . acetaminophen (TYLENOL) 500 MG tablet Take 1-2 tabs every 6 hours prn pain. Do not exceed 7 tablets daily. (Patient not taking: Reported on 09/27/2018) 120 tablet 1  . nitroGLYCERIN (NITROSTAT) 0.4 MG SL tablet Place 1 tablet (0.4 mg total) under the tongue every 5 (five) minutes as needed for chest pain. 25 tablet 3   No current facility-administered medications for this encounter.     Physical Findings: Wt Readings from Last 3 Encounters:  09/27/18 146 lb 12.8 oz (66.6 kg)  06/13/18 137 lb 4 oz (62.3 kg)  05/31/18 139 lb 3.2 oz (63.1 kg)    weight is 146 lb 12.8 oz (66.6 kg). His oral temperature is 97.7 F (36.5 C). His blood  pressure is 128/73 and his pulse is 56 (abnormal). His oxygen saturation is 100%.    General: Alert and oriented, in no acute distress HEENT: Head is normocephalic. Extraocular movements are intact. Oropharynx is clear.  Neck: Neck is supple, no palpable cervical or supraclavicular lymphadenopathy. Heart: Regular in rate and rhythm with no murmurs, rubs, or gallops. Chest: Clear to auscultation bilaterally, with no rhonchi, wheezes, or rales. Psych: pleasant to speak with, alert and conversant   PROCEDURE NOTE: After obtaining consent and anesthetizing the nasal cavity with topical lidocaine and phenylephrine, the flexible endoscope was introduced and passed through the nasal cavity. Vocal cords are  symmetrically mobile and without nodules or lesions. Remainder of pharynx and larynx show no masses or active bleeding.   Lab Findings: Lab Results  Component Value Date   WBC 8.4 04/21/2018   HGB 15.2 04/21/2018   HCT 44.4 04/21/2018   MCV 93.7 04/21/2018   PLT 215 04/21/2018    Lab Results  Component Value Date   TSH 1.098 12/17/2017    Radiographic Findings: No results found.  Impression/Plan:    1) Head and Neck Cancer Status: NED.    2) Nutritional Status: stable weight  3) Risk Factors: The patient has been educated about risk factors including alcohol and tobacco abuse; they understand that avoidance of alcohol and tobacco is important to prevent recurrences as well as other cancers - still smoking. Patient did stop drinking alcohol two months ago when he began seeing his psychiatrist.  4) Swallowing: Occasionally gets food stuck in his throat.  5) Thyroid function:  Checked with his PCP on 09/11/2018 and found to be 4.89. Lab Results  Component Value Date   TSH 1.098 12/17/2017    6) ENT: Follow up with Dr. Wilburn Cornelia in 3 months with a laryngoscopy. Gayleen Orem, RN, our Head and Neck Oncology Navigator will schedule this appointment.  7) Follow-up in 7 months.  The patient was encouraged to call with any issues or questions before then.   _____________________________________   Eppie Gibson, MD  This document serves as a record of services personally performed by Eppie Gibson, MD. It was created on her behalf by Arlyce Harman, a trained medical scribe. The creation of this record is based on the scribe's personal observations and the provider's statements to them. This document has been checked and approved by the attending provider.

## 2018-09-30 ENCOUNTER — Encounter: Payer: Self-pay | Admitting: Radiation Oncology

## 2018-09-30 ENCOUNTER — Telehealth: Payer: Self-pay | Admitting: *Deleted

## 2018-09-30 NOTE — Telephone Encounter (Signed)
Oncology Nurse Navigator Documentation  Per patient's 10/25 post-treatment follow-up with Dr. Isidore Moos, called [facility] ENT to coordinate appointment with Dr.   Wilburn Cornelia.  Spoke with [facility], requested patient be contacted and scheduled for routine follow-up in 3 months.  She verbalized understanding.  Gayleen Orem, RN, BSN Head & Neck Oncology Nurse [place] [facility] [place] [phone]

## 2018-10-19 ENCOUNTER — Emergency Department: Payer: Medicaid Other

## 2018-10-19 ENCOUNTER — Emergency Department
Admission: EM | Admit: 2018-10-19 | Discharge: 2018-10-19 | Disposition: A | Discharge status: Home / Self Care | Payer: Medicaid Other | Attending: Emergency Medicine | Admitting: Emergency Medicine

## 2018-10-19 ENCOUNTER — Other Ambulatory Visit: Payer: Self-pay

## 2018-10-19 DIAGNOSIS — F17210 Nicotine dependence, cigarettes, uncomplicated: Secondary | ICD-10-CM | POA: Insufficient documentation

## 2018-10-19 DIAGNOSIS — F10920 Alcohol use, unspecified with intoxication, uncomplicated: Secondary | ICD-10-CM

## 2018-10-19 DIAGNOSIS — Z7982 Long term (current) use of aspirin: Secondary | ICD-10-CM | POA: Insufficient documentation

## 2018-10-19 DIAGNOSIS — I10 Essential (primary) hypertension: Secondary | ICD-10-CM | POA: Insufficient documentation

## 2018-10-19 DIAGNOSIS — R079 Chest pain, unspecified: Secondary | ICD-10-CM | POA: Insufficient documentation

## 2018-10-19 DIAGNOSIS — I2510 Atherosclerotic heart disease of native coronary artery without angina pectoris: Secondary | ICD-10-CM | POA: Insufficient documentation

## 2018-10-19 DIAGNOSIS — Z79899 Other long term (current) drug therapy: Secondary | ICD-10-CM | POA: Diagnosis not present

## 2018-10-19 DIAGNOSIS — Y907 Blood alcohol level of 200-239 mg/100 ml: Secondary | ICD-10-CM | POA: Insufficient documentation

## 2018-10-19 DIAGNOSIS — Z955 Presence of coronary angioplasty implant and graft: Secondary | ICD-10-CM | POA: Diagnosis not present

## 2018-10-19 LAB — URINE DRUG SCREEN, QUALITATIVE (ARMC ONLY)
Amphetamines, Ur Screen: NOT DETECTED
Barbiturates, Ur Screen: NOT DETECTED
Benzodiazepine, Ur Scrn: POSITIVE — AB
Cannabinoid 50 Ng, Ur ~~LOC~~: NOT DETECTED
Cocaine Metabolite,Ur ~~LOC~~: NOT DETECTED
MDMA (Ecstasy)Ur Screen: NOT DETECTED
Methadone Scn, Ur: NOT DETECTED
Opiate, Ur Screen: NOT DETECTED
Phencyclidine (PCP) Ur S: NOT DETECTED
Tricyclic, Ur Screen: NOT DETECTED

## 2018-10-19 LAB — CBC
HCT: 47.4 % (ref 39.0–52.0)
Hemoglobin: 15.7 g/dL (ref 13.0–17.0)
MCH: 32.8 pg (ref 26.0–34.0)
MCHC: 33.1 g/dL (ref 30.0–36.0)
MCV: 99 fL (ref 80.0–100.0)
Platelets: 190 10*3/uL (ref 150–400)
RBC: 4.79 MIL/uL (ref 4.22–5.81)
RDW: 12.2 % (ref 11.5–15.5)
WBC: 9.1 10*3/uL (ref 4.0–10.5)
nRBC: 0 % (ref 0.0–0.2)

## 2018-10-19 LAB — BASIC METABOLIC PANEL
Anion gap: 11 (ref 5–15)
BUN: 8 mg/dL (ref 6–20)
CO2: 25 mmol/L (ref 22–32)
Calcium: 9.6 mg/dL (ref 8.9–10.3)
Chloride: 105 mmol/L (ref 98–111)
Creatinine, Ser: 0.84 mg/dL (ref 0.61–1.24)
GFR calc Af Amer: >60 mL/min (ref >60)
GFR calc non Af Amer: >60 mL/min (ref >60)
Glucose, Bld: 102 mg/dL — ABNORMAL HIGH (ref 70–99)
Potassium: 4.1 mmol/L (ref 3.5–5.1)
Sodium: 141 mmol/L (ref 135–145)

## 2018-10-19 LAB — TROPONIN I: Troponin I: <0.03 ng/mL (ref <0.03)

## 2018-10-19 LAB — ETHANOL: Alcohol, Ethyl (B): 203 mg/dL — ABNORMAL HIGH (ref <10)

## 2018-10-19 MED ORDER — KETOROLAC TROMETHAMINE 30 MG/ML IJ SOLN
30.0000 mg | Freq: Once | INTRAMUSCULAR | Status: AC
  Administered 2018-10-19: 30 mg via INTRAVENOUS
  Filled 2018-10-19: qty 1

## 2018-10-19 MED ORDER — ACETAMINOPHEN 325 MG PO TABS
650.0000 mg | ORAL_TABLET | Freq: Once | ORAL | Status: AC
  Administered 2018-10-19: 650 mg via ORAL
  Filled 2018-10-19: qty 2

## 2018-10-19 NOTE — ED Notes (Signed)
Pt state he has been drinking all day and took 3 bars of xanax just before he came. He is complaining of right sided rib area chest pain. Pt smokes 2 packs cigarettes per day. Pt has a hx MI and multiple stents

## 2018-10-19 NOTE — ED Notes (Signed)
ED Provider at bedside. 

## 2018-10-19 NOTE — ED Notes (Signed)
Pt also states right abd pain where he had a hernia reduced

## 2018-10-19 NOTE — ED Notes (Signed)
Pt is quite insistent that he should have Morphine for pain. Spoke with EDP and told he will not receive narcotics at this time. Pt then said a [place] sandwich would be good. Sandwich given

## 2018-10-19 NOTE — ED Notes (Signed)
Pt ride is here at this time, pt given to friend and placed into car by this RN

## 2018-10-19 NOTE — ED Notes (Addendum)
Pt verbalizes d/c understanding , pt A&Ox4, pt ambulatory but with unsteady gait. PT refusing to stay in room, pt placed in wc and back into room, CHarge RN and BPD at bedside. PT friend called at this time and will be here in 15min. Pt in wc in room, will continue to monitor

## 2018-10-19 NOTE — ED Provider Notes (Signed)
[facility] Emergency Department Provider Note    First MD Initiated Contact with Patient 10/19/18 [phone]     (approximate)  I have reviewed the triage vital signs and the nursing notes.   HISTORY  Chief Complaint Chest Pain     HPI [name] is a 57 y.o. male with below list of chronic medical conditions including "heart attack x4 hepatitis C peripheral artery disease schizophrenia presents to the emergency department with left-sided chest pain with radiation to the left arm with associated dyspnea which began 3 hours ago.  Patient states "I been drinking all day".  Patient also states "I took 3 Xanax bars before coming to the ER".  Patient denies any lower externally pain or swelling.  Patient denies any diaphoresis no headache nausea or vomiting.  Patient states current pain score is 8 out of 10.   Past Medical History:  Diagnosis Date  . Anxiety   . Bipolar affective ([place])    takes Lithium daily  . Broken fingers   . Coronary artery disease    a. 1997 PCI: RCA ([facility]); b. 2008 PCI: RCA; c. 05/2012 Cath: LCx 100 (previously stented - ? date), RCA stent w/ mild ISR, LAD nonobs, EF 35%; d. 06/2013 Cath: stable anatomy; e. 03/2018 Inf STEMI/PCI: LCX 100o/p ISR (CTO), RCA 118m/d (3.0x38 Resolute Onyx DES), EF 40-45%.   . Depression   . GERD (gastroesophageal reflux disease)    takes Protonix daily  . Hepatitis C    "diagnosed in the past 2 wk" (10/22/2012)  . History of ETOH abuse    quit 2007  . History of radiation therapy 12/17/17- 01/24/18   Larynx treated to 63 gy in 28 fx of 2.25 Gy  . Hyperlipidemia   . Hypertension    "pt. denies - no longer taking blood pressure medications"  . Ischemic cardiomyopathy    a. 03/2018 LV gram: EF 40-45% @ time of MI.  Marland Kitchen Myocardial infarction ([place])    x 4  . PAD (peripheral artery disease) ([place])    a. 2010 PTA & stent to left CIA & left SFA; b. 05/2012 PTA & stent to distal LCIA; c. 10/2012 thrombectomy and  stents to [place]; d. 11/2014 s/p Ao-Bifem bypass.  . Schizophrenia ([place])   . Seizures ([place])    "when I get anxious" (Stated last seizure three months ago)  . Tobacco abuse   . Urinary frequency     Patient Active Problem List   Diagnosis Date Noted  . Malnutrition of moderate degree 03/09/2018  . STEMI involving oth coronary artery of inferior wall ([place]) 03/07/2018  . Malignant neoplasm of glottis ([place]) 12/11/2017  . 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 Cholecystitis 06/29/2017  . Osteoarthritis of spine with radiculopathy, cervical region 01/26/2017  . Gallbladder polyp 06/01/2016  . Rib pain on right side 06/01/2016  . ETOH abuse 11/24/2014  . Bipolar disorder, currently in remission ([place]) 11/24/2014  . Alcohol dependence with withdrawal with complication ([place]) 23/76/2831  . Chronic hepatitis C ([place]) 09/12/2013  . Cardiomyopathy, ischemic 06/09/2013  . PAD (peripheral artery disease) ([place]) 05/23/2012  . HTN (hypertension) 10/09/2011  . CAROTID BRUIT 12/19/2010  . DYSPNEA 06/15/2010  . Hyperlipidemia 05/06/2010  . TOBACCO USER 05/06/2010  . CAD, NATIVE VESSEL 05/06/2010  . 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 ABDOMINAL AORTAGRAM N/A 10/22/2012   Procedure: ABDOMINAL AORTAGRAM;  Surgeon:  Wellington Hampshire, MD;  Location: [place] CATH LAB;  Service: Cardiovascular;  Laterality: N/A;  . ABDOMINAL AORTAGRAM N/A 11/11/2014   Procedure: ABDOMINAL Maxcine Ham;  Surgeon: Wellington Hampshire, MD;  Location: [place] CATH LAB;  Service: Cardiovascular;  Laterality: N/A;  . ANTERIOR CERVICAL DECOMP/DISCECTOMY FUSION  01/2011   plate & screw placement   . ANTERIOR CERVICAL DECOMP/DISCECTOMY FUSION N/A 01/26/2017   Procedure: ANTERIOR CERVICAL DECOMPRESSION/DISCECTOMY FUSION CERVICAL FOUR- CERVICAL FIVE;  Surgeon: Ashok Pall, MD;  Location: [place];  Service: Neurosurgery;  Laterality: N/A;  ANTERIOR  CERVICAL DECOMPRESSION/DISCECTOMY FUSION CERVICAL FOUR- CERVICAL FIVE  . AORTA - BILATERAL FEMORAL ARTERY BYPASS GRAFT N/A 11/20/2014   Procedure: AORTA BIFEMORAL BYPASS GRAFT;  Surgeon: Mal Misty, MD;  Location: [place];  Service: Vascular;  Laterality: N/A;  . BACK SURGERY    . BRAIN SURGERY  1989   subdural hematoma  . CARDIAC CATHETERIZATION     Stent - 12  . CHOLECYSTECTOMY N/A 06/29/2017   Procedure: LAPAROSCOPIC CHOLECYSTECTOMY WITH INTRAOPERATIVE CHOLANGIOGRAM, POSSIBLE OPEN;  Surgeon: Robert Bellow, MD;  Location: [facility] ORS;  Service: General;  Laterality: N/A;  . CORONARY ANGIOPLASTY WITH STENT PLACEMENT  03/2007   to RCA  . CORONARY/GRAFT ACUTE MI REVASCULARIZATION N/A 03/07/2018   Procedure: Coronary/Graft Acute MI Revascularization;  Surgeon: Isaias Cowman, MD;  Location: [place] CV LAB;  Service: Cardiovascular;  Laterality: N/A;  . HERNIA REPAIR    . ILIAC ARTERY STENT  10/22/2012   "2" (10/22/2012)  . ILIAC ARTERY STENT  06/2012   left  . INCISIONAL HERNIA REPAIR N/A 11/08/2015   Procedure: INCISIONAL HERNIA REPAIR WITH MYOFASCIAL RELEASE;  Surgeon: Rolm Bookbinder, MD;  Location: [place];  Service: General;  Laterality: N/A;  . INSERTION OF MESH N/A 11/08/2015   Procedure: INSERTION OF MESH;  Surgeon: Rolm Bookbinder, MD;  Location: [place];  Service: General;  Laterality: N/A;  . LEFT HEART CATH AND CORONARY ANGIOGRAPHY N/A 03/07/2018   Procedure: LEFT HEART CATH AND CORONARY ANGIOGRAPHY;  Surgeon: Isaias Cowman, MD;  Location: [place] CV LAB;  Service: Cardiovascular;  Laterality: N/A;  . LEFT HEART CATHETERIZATION WITH CORONARY ANGIOGRAM N/A 05/29/2012   Procedure: LEFT HEART CATHETERIZATION WITH CORONARY ANGIOGRAM;  Surgeon: Wellington Hampshire, MD;  Location: [place] CATH LAB;  Service: Cardiovascular;  Laterality: N/A;  . LIVER BIOPSY  10/2012  . LOWER EXTREMITY ANGIOGRAM N/A 05/29/2012   Procedure: LOWER EXTREMITY ANGIOGRAM;  Surgeon: Wellington Hampshire, MD;  Location: [place] CATH LAB;  Service: Cardiovascular;  Laterality: N/A;  . MICROLARYNGOSCOPY WITH CO2 LASER AND EXCISION OF VOCAL CORD LESION N/A 11/21/2017   Procedure: MICROLARYNGOSCOPY WITH EXCISION OF VOCAL CORD LESION;  Surgeon: Jerrell Belfast, MD;  Location: [place];  Service: ENT;  Laterality: N/A;  . PERCUTANEOUS STENT INTERVENTION Left 05/29/2012   Procedure: PERCUTANEOUS STENT INTERVENTION;  Surgeon: Wellington Hampshire, MD;  Location: [place] CATH LAB;  Service: Cardiovascular;  Laterality: Left;  . POSTERIOR FUSION CERVICAL SPINE  2012  . SUBDURAL HEMATOMA EVACUATION VIA CRANIOTOMY      Prior to Admission medications   Medication Sig Start Date End Date Taking? Authorizing Provider  alprazolam Duanne Moron) 2 MG tablet Take 1-2 mg by mouth 2 (two) times daily. 10/04/18  Yes [provider]  atorvastatin (LIPITOR) 40 MG tablet Take 1 tablet (40 mg total) by mouth daily at 6 PM. 03/09/18  Yes Pyreddy, Reatha Harps, MD  carvedilol (COREG) 3.125 MG tablet Take 3.125 mg by mouth 2 (two) times daily with a  meal.    Yes [provider]  divalproex (DEPAKOTE ER) 500 MG 24 hr tablet Take 1,000 mg by mouth at bedtime.  06/17/17  Yes [provider]  lithium carbonate (ESKALITH) 450 MG CR tablet Take 450 mg by mouth 2 (two) times daily.   Yes [provider]  losartan (COZAAR) 50 MG tablet Take 1 tablet (50 mg total) by mouth daily. 06/13/18  Yes Wellington Hampshire, MD  pantoprazole (PROTONIX) 40 MG tablet Take 40 mg by mouth daily at 2 PM. In the afternoon. 06/10/17  Yes [provider]  ticagrelor (BRILINTA) 90 MG TABS tablet Take 1 tablet (90 mg total) by mouth 2 (two) times daily. 03/14/18  Yes Theora Gianotti, NP  VRAYLAR 4.5 MG CAPS Take 4.5 capsules by mouth at bedtime. 10/01/18  Yes [provider]  acetaminophen (TYLENOL) 500 MG tablet Take 1-2 tabs every 6 hours prn pain. Do not exceed 7 tablets daily. Patient not taking: Reported on 09/27/2018  12/24/17   Eppie Gibson, MD  aspirin 81 MG chewable tablet Chew 1 tablet (81 mg total) by mouth daily. 03/10/18   Saundra Shelling, MD  nitroGLYCERIN (NITROSTAT) 0.4 MG SL tablet Place 1 tablet (0.4 mg total) under the tongue every 5 (five) minutes as needed for chest pain. 03/14/18 06/13/18  Theora Gianotti, NP    Allergies Lisinopril  Family History  Problem Relation Age of Onset  . Emphysema Father   . COPD Mother   . Diabetes Other   . Alcohol abuse Other   . Hypertension Other   . Hyperlipidemia Other   . Seizures Other     Social History Social History   Tobacco Use  . Smoking status: Current Every Day Smoker    Packs/day: 1.00    Years: 40.00    Pack years: 40.00    Types: Cigarettes  . Smokeless tobacco: Never Used  . Tobacco comment: "ive slowed down recently"   Substance Use Topics  . Alcohol use: Not Currently    Alcohol/week: 0.0 standard drinks    Comment: he tells me that he stopped drinking two months ago- 09/27/18  . Drug use: No    Review of Systems Constitutional: No fever/chills Eyes: No visual changes. ENT: No sore throat. Cardiovascular: Positive for chest pain. Respiratory: Positive for shortness of breath. Gastrointestinal: No abdominal pain.  No nausea, no vomiting.  No diarrhea.  No constipation. Genitourinary: Negative for dysuria. Musculoskeletal: Negative for neck pain.  Negative for back pain. Integumentary: Negative for rash. Neurological: Negative for headaches, focal weakness or numbness. Psychiatry: Positive for alcohol ingestion and benzodiazepine use before arrival ____________________________________________   PHYSICAL EXAM:  VITAL SIGNS: ED Triage Vitals  Enc Vitals Group     BP 10/19/18 0432 (!) 143/95     Pulse Rate 10/19/18 0434 80     Resp 10/19/18 0432 13     Temp --      Temp Source 10/19/18 0434 Oral     SpO2 10/19/18 0434 98 %     Weight 10/19/18 0434 68 kg (150 lb)     Height 10/19/18 0434 1.702 m (5'  7")     Head Circumference --      Peak Flow --      Pain Score 10/19/18 0434 8     Pain Loc --      Pain Edu? --      Excl. in [place]? --     Constitutional: Alert and oriented.  Appears intoxicated  eyes: Conjunctivae are normal. PERRL. EOMI. Head: Atraumatic. Mouth/Throat: Mucous membranes are moist.  Oropharynx non-erythematous. Neck: No stridor.   Cardiovascular: Normal rate, regular rhythm. Good peripheral circulation. Grossly normal heart sounds. Respiratory: Normal respiratory effort.  No retractions. Lungs CTAB. Gastrointestinal: Soft and nontender. No distention.  Musculoskeletal: No lower extremity tenderness nor edema. No gross deformities of extremities. Neurologic:  Normal speech and language. No gross focal neurologic deficits are appreciated.  Skin:  Skin is warm, dry and intact. No rash noted. Psychiatric: Appears intoxicated, slurred speech. ____________________________________________   LABS (all labs ordered are listed, but only abnormal results are displayed)  Labs Reviewed  BASIC METABOLIC PANEL - Abnormal; Notable for the following components:      Result Value   Glucose, Bld 102 (*)    All other components within normal limits  ETHANOL - Abnormal; Notable for the following components:   Alcohol, Ethyl (B) 203 (*)    All other components within normal limits  URINE DRUG SCREEN, QUALITATIVE ([facility] ONLY) - Abnormal; Notable for the following components:   Benzodiazepine, Ur Scrn POSITIVE (*)    All other components within normal limits  CBC  TROPONIN I   ____________________________________________  EKG  ED ECG REPORT I, Alma N Brittnae Aschenbrenner, the attending physician, personally viewed and interpreted this ECG.   Date: 10/19/2018  EKG Time: 4:32 AM  Rate: 82  Rhythm: Normal sinus rhythm  Axis: Normal  Intervals: Normal  ST&T Change: Inferior Q waves  ____________________________________________  RADIOLOGY I,  N Zakhi Dupre, personally viewed and  evaluated these images (plain radiographs) as part of my medical decision making, as well as reviewing the written report by the radiologist.  ED MD interpretation: No acute cardiopulmonary process noted on chest x-ray  Official radiology report(s): Dg Chest Port 1 View  Result Date: 10/19/2018 CLINICAL DATA:  Chest pain for 3 hours, shortness of breath. Possible alcohol withdrawals. EXAM: PORTABLE CHEST 1 VIEW COMPARISON:  Chest radiograph Apr 21, 2018 FINDINGS: Cardiomediastinal silhouette is normal. Calcified aortic arch. Coronary artery calcification. No pleural effusion or focal consolidation. Minimal RIGHT lung base scarring. Two level ACDF. Faint calcifications neck are likely vascular. No pneumothorax. Old rib fractures. IMPRESSION: 1. No acute cardiopulmonary process. 2.  Aortic Atherosclerosis (ICD10-I70.0). Electronically Signed   By: Elon Alas M.D.   On: 10/19/2018 05:27      Procedures   ____________________________________________   INITIAL IMPRESSION / ASSESSMENT AND PLAN / ED COURSE  As part of my medical decision making, I reviewed the following data within the electronic MEDICAL RECORD NUMBER  57 year old male presenting with above-stated history and physical exam secondary to chest discomfort.  EKG revealed no evidence of ischemia or infarction.  Laboratory data thus far including troponin x1-.  Patient appeared to be intoxicated which was confirmed with a blood alcohol level noted of 203.  Patient's urine drug screen revealed benzodiazepines.  Given patient's extensive cardiac history will obtain a second troponin and if negative patient will be discharged home with outpatient follow-up. ____________________________________________  FINAL CLINICAL IMPRESSION(S) / ED DIAGNOSES  Final diagnoses:  Alcoholic intoxication without complication (HCC)  Chest pain, unspecified type     MEDICATIONS GIVEN DURING THIS VISIT:  Medications  acetaminophen (TYLENOL) tablet  650 mg (650 mg Oral Given 10/19/18 0559)     ED Discharge Orders    None       Note:  This document was prepared using Dragon voice recognition software and may include unintentional dictation errors.    Marjean Donna  Delane Ginger, MD 10/19/18 [phone]

## 2018-10-19 NOTE — ED Triage Notes (Signed)
Pt states chest pain for 3 hours, central with shob. Pt states has had 4 mi in past. Pt also states he feels like he is going into etoh withdrawal, last etoh one hour pta. Pt with tremors noted.

## 2018-12-08 ENCOUNTER — Other Ambulatory Visit: Payer: Self-pay | Admitting: Cardiovascular Disease

## 2018-12-09 NOTE — Telephone Encounter (Signed)
RX sent in for losartan 25 mg- take 2 tablets (50 mg) by mouth once daily due to losartan 50 mg tablets being on backorder.

## 2018-12-09 NOTE — Telephone Encounter (Signed)
Error Pt's 50 mg tablet on BO. Losartan 25 mg tablet is available needing new Rx.

## 2018-12-09 NOTE — Telephone Encounter (Signed)
Please advise how to feel losartan pt's losartan 25 mg tablet is currently on Back order.

## 2018-12-25 IMAGING — CT CT CHEST W/O CM
2 of 4 series · 15 of 36 positions shown, 18 images · non-contrast
Comparison: Chest x-ray dated 12/17/2017

CLINICAL DATA: Lung nodule.

EXAM:
CT CHEST WITHOUT CONTRAST
TECHNIQUE: Multidetector CT imaging of the chest was performed following the
standard protocol without IV contrast.

[Series 2: chest · axial · 0.66mm/px · z∈[-1256,-950]mm · 12 of 181 slices shown, 15 images (1 of 2)]
[im 14/181  mediastinal]
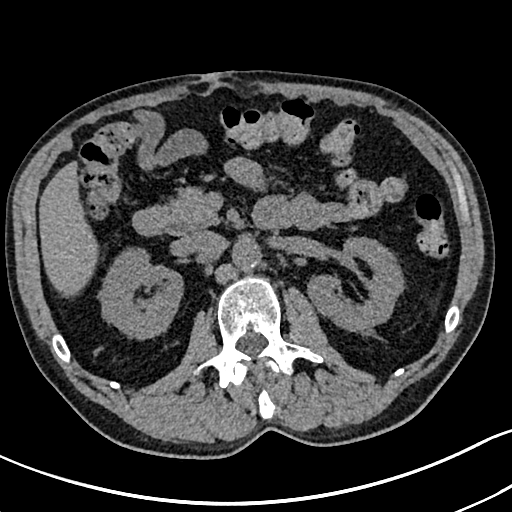
[im 14/181  lung]
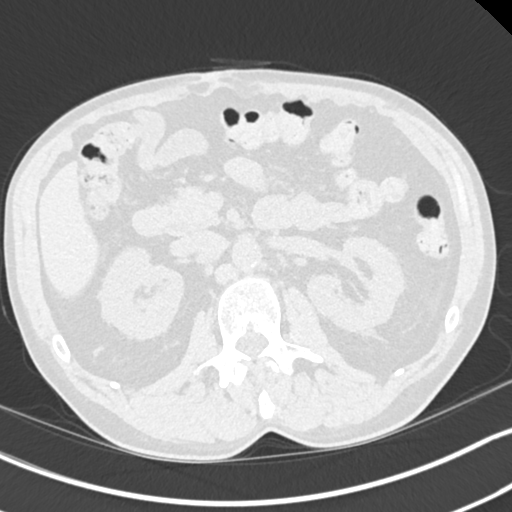
[im 28/181  lung]
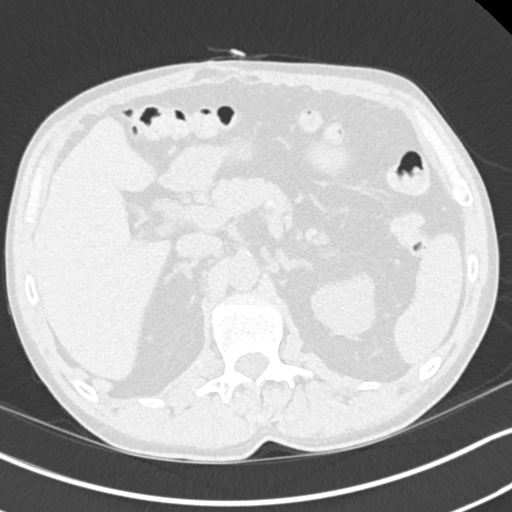
[im 42/181  lung]
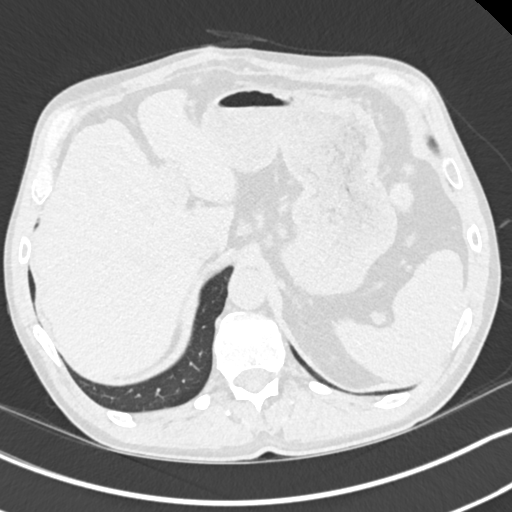
[im 56/181  lung]
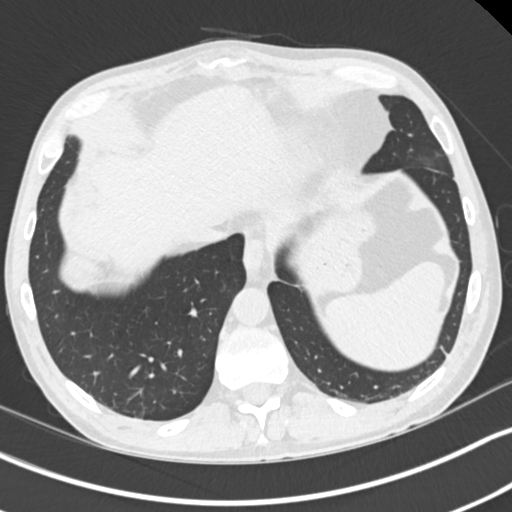
[im 70/181  mediastinal]
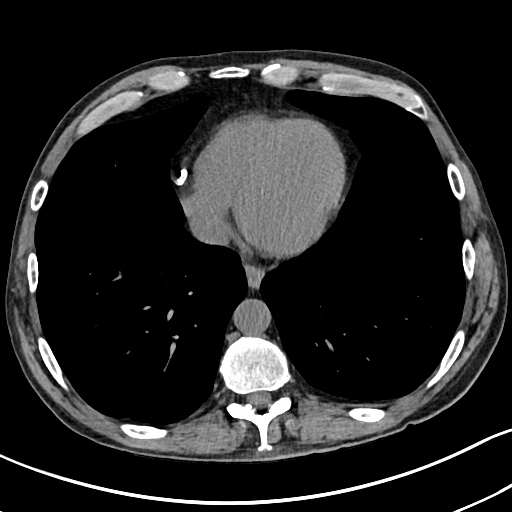
[im 70/181  lung]
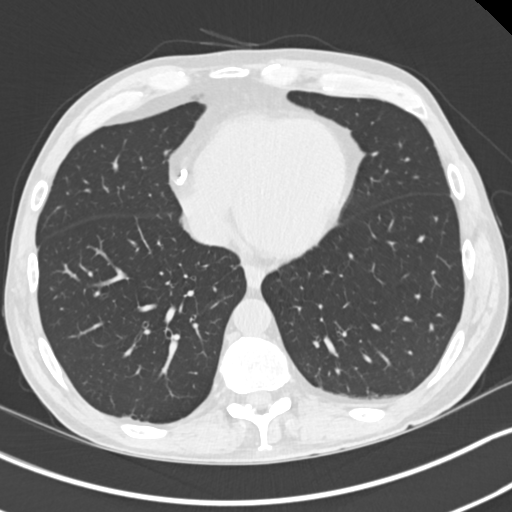
[im 84/181  lung]
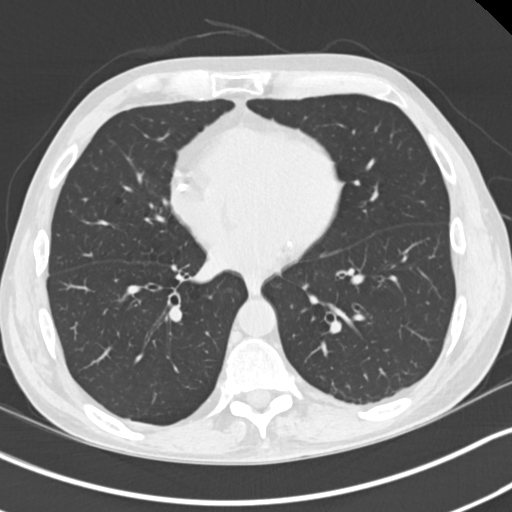
[im 97/181  lung]
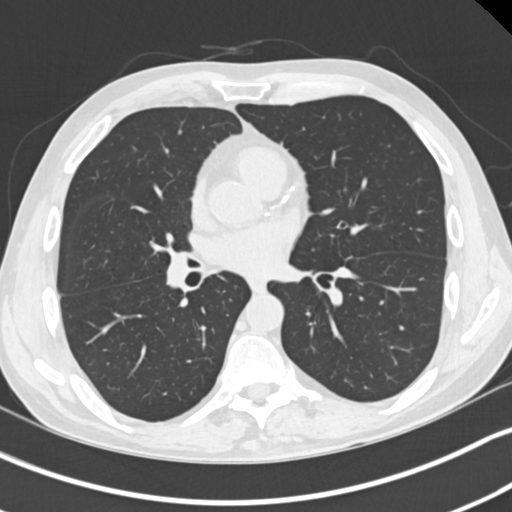
[im 111/181  lung]
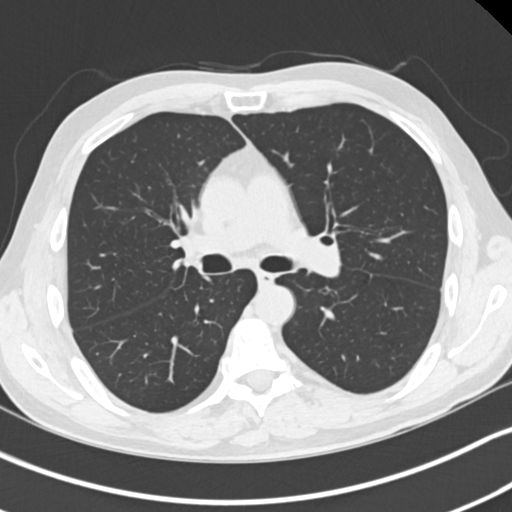
[im 125/181  mediastinal]
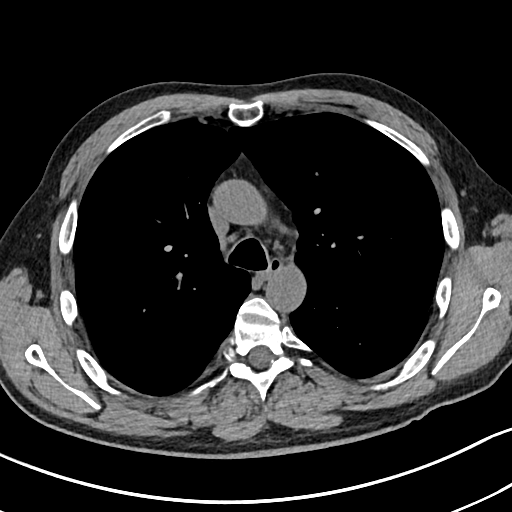
[im 125/181  lung]
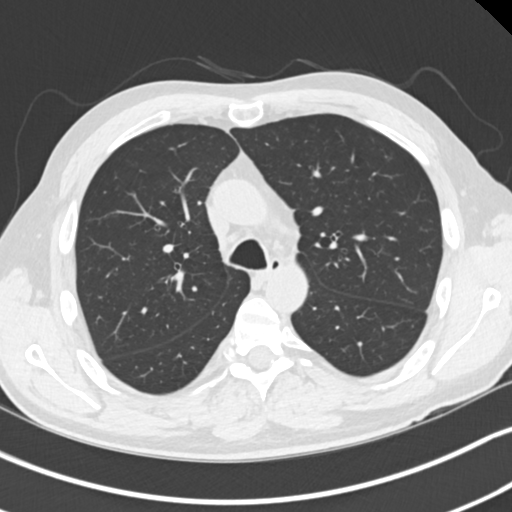
[im 139/181  lung]
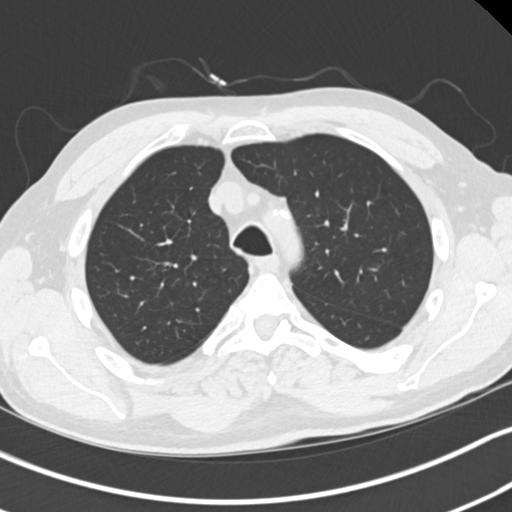
[im 153/181  lung]
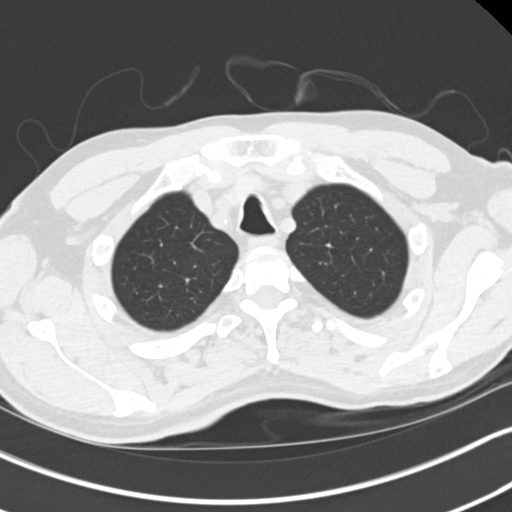
[im 167/181  lung]
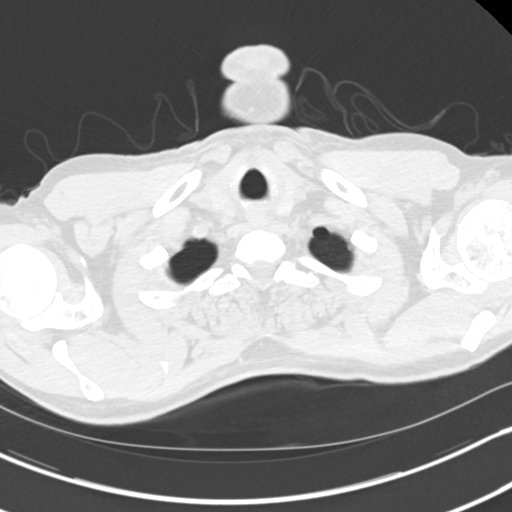

[Series 5: chest · coronal · 0.66mm/px · 3 of 129 slices shown (2 of 2)]
[im 26/129  lung]
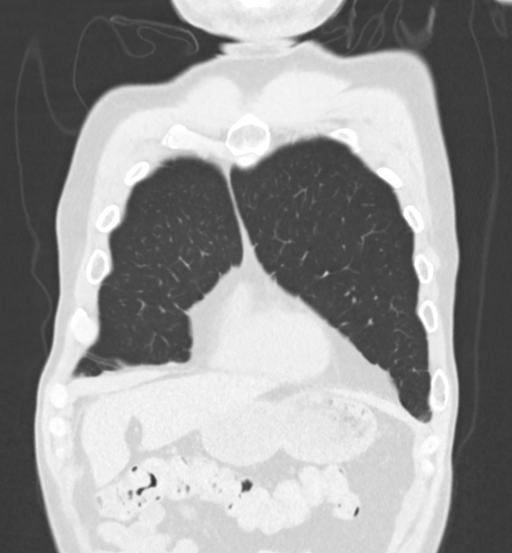
[im 52/129  lung]
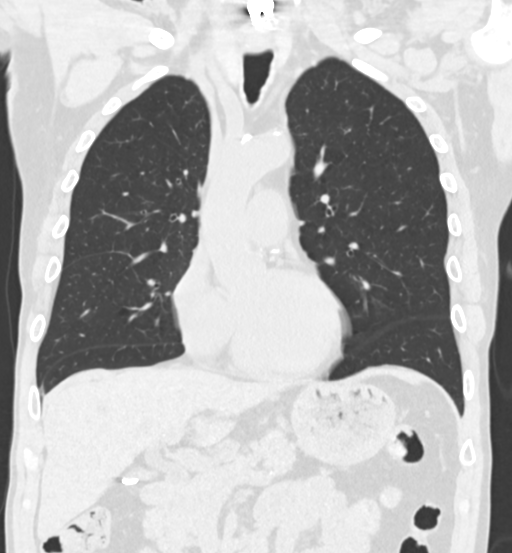
[im 77/129  lung]
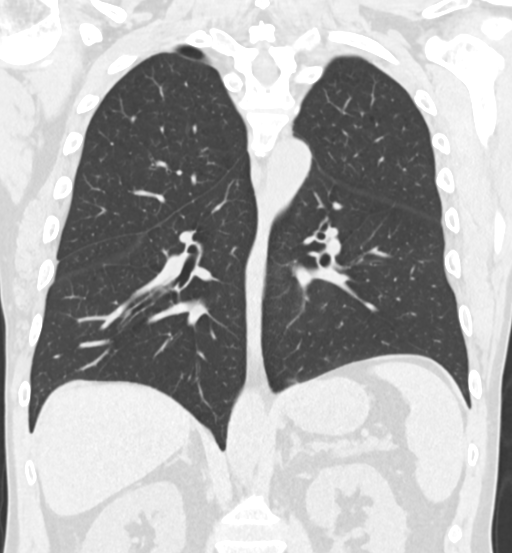

[15 of 36 positions shown; findings below may reference images not displayed]

FINDINGS: Cardiovascular: Aortic and coronary calcifications. Heart size is
normal. No pericardial effusion.

Mediastinum/Nodes: No enlarged mediastinal or axillary lymph nodes.
Thyroid gland, trachea, and esophagus demonstrate no significant
findings.

Lungs/Pleura: There is a 2 mm nodule in the right lower lobe on
image 90 and 2 mm nodule in the left upper lobe on image 53, both on
series 3. No other pulmonary nodules. Blebs in the lung apices. The
bleb in the right apex is 4.5 cm in diameter. No effusions.

Upper Abdomen: Multiple small low-density lesions in the dome of the
right lobe of the liver consistent with hepatic cysts. 18 mm
low-density nodule in the anterior aspect of the mid right kidney
consistent with a cyst. Previous cholecystectomy.

Musculoskeletal: No acute abnormality. Several Schmorl's nodes in
the thoracic spine with anterior wedge deformities at several
levels.
IMPRESSION: 1. Two tiny pulmonary nodules as described. No follow-up needed if
patient is low-risk (and has no known or suspected primary
neoplasm). Non-contrast chest CT can be considered in 12 months if
patient is high-risk. This recommendation follows the consensus
statement: Guidelines for Management of Incidental Pulmonary Nodules
Detected on CT Images: From the [HOSPITAL] 0248; Radiology
2. Aortic atherosclerosis.

## 2019-03-15 ENCOUNTER — Other Ambulatory Visit: Payer: Self-pay | Admitting: Cardiovascular Disease

## 2019-03-18 ENCOUNTER — Other Ambulatory Visit: Payer: Self-pay | Admitting: Cardiovascular Disease

## 2019-03-18 NOTE — Telephone Encounter (Signed)
Call placed to [facility]. They stated that Losartan is on a national back order and were asking if the Telmisartan 20 mg (or Valsartan) could be used as a replacement.  The message has been routed to the provider for recommendations.

## 2019-03-18 NOTE — Telephone Encounter (Signed)
Please review for refill.  

## 2019-03-20 NOTE — Telephone Encounter (Signed)
Ok to switch 

## 2019-03-20 NOTE — Telephone Encounter (Signed)
Left a message for the patient to call back to inform him of the switch to Telmisartan 20 mg daily due to the shortage of the Losartan. He will need to keep track of his blood pressure and heart rate.

## 2019-03-25 NOTE — Telephone Encounter (Signed)
Left a message for the patient to call back.  

## 2019-03-27 NOTE — Telephone Encounter (Signed)
Left a message for the patient to call back.  This is the third attempt to reach the patient. Encounter will be closed.

## 2019-04-19 ENCOUNTER — Other Ambulatory Visit: Payer: Self-pay | Admitting: Cardiovascular Disease

## 2019-04-25 NOTE — Telephone Encounter (Signed)
Patient needs to make a follow up appointment with Dr. Fletcher Anon.

## 2019-05-02 ENCOUNTER — Ambulatory Visit: Payer: Medicaid Other | Admitting: Radiation Oncology

## 2019-05-02 ENCOUNTER — Telehealth: Payer: Self-pay | Admitting: *Deleted

## 2019-05-02 NOTE — Telephone Encounter (Signed)
CALLED PATIENT TO INFORM THAT FU APPT. HAS BEEN MOVED TO 01-30-20 @ 2 PM, LVM FOR A RETURN CALL

## 2019-05-04 NOTE — Telephone Encounter (Signed)
Oncology Nurse Navigator Documentation  Confirmed with scheduler [facility], pt was seen by Dr. Wilburn Cornelia 01/10/2019, will be scheduled for further follow-up in early August.  I requested appt include laryngoscopy per Dr. Isidore Moos.  She voiced understanding.  Gayleen Orem, RN, BSN Head & Neck Oncology Nurse [place] [facility] [place] [phone]

## 2019-05-27 ENCOUNTER — Other Ambulatory Visit: Payer: Self-pay | Admitting: Cardiovascular Disease

## 2019-05-27 NOTE — Telephone Encounter (Signed)
Left a message for the patient to call back regarding a refill and a follow up that is past due.

## 2019-06-02 NOTE — Progress Notes (Signed)
Cardiology Office Note Date:  06/04/2019  Patient ID:  [name], [name] [birth year], MRN [id number] PCP:  Gayland Curry, MD  Cardiologist:  Dr. Fletcher Anon, MD    Chief Complaint: Follow up  History of Present Illness: [name] is a 58 y.o. male with history of CAD status post PCI as below, HFrEF secondary to ICM, PAD status post prior bilateral iliac stenting and more recent aortobifemoral bypass in 11/2014 by Dr. Kellie Simmering followed by [facility], mild to moderate left-sided renal artery stenosis, hepatitis C, ongoing tobacco and alcohol abuse, bipolar disorder, laryngeal cancer, hypertension, and hyperlipidemia who presents for follow-up of his CAD.  Prior cath in 2008 with successful PCI to the RCA.  Repeat cath in 05/2012 showed an occluded LCx that was previously stented with details unclear, patent RCA stent with mild in-stent restenosis, nonobstructive disease involving the LAD with an EF of 35%.  Repeat cath in 06/2013 showed stable coronary anatomy.  He was admitted to the hospital in 03/2018 with an inferior ST elevation MI and was found to have an occluded RCA stent which was successfully treated with PCI/DES.  His previously documented occluded LCx was again noted.  EF was 40 to 45%.  He was most recently seen in the office in 06/2018 and unfortunately continued to smoke and drink at least 12 beers daily.  He has attended rehab multiple times in the past without success.  He also noted chronic substernal chest pain described as an aching sensation which was felt to be atypical.  Patient was seen in the ED on 10/19/2018 with left-sided chest pain.  In the ED he told staff he had "been drinking all day" and "took 3 Xanax bars."  Troponin negative x1.  Ethanol 203, urine drug screen positive for benzodiazepine, CBC unremarkable, potassium 4.1, serum creatinine 0.84, chest x-ray with aortic atherosclerosis without acute cardiopulmonary process, EKG not scanned in for review.  Patient insisted on  IV narcotics which were not administered.  It appears patient left via friend's vehicle prior to repeat troponin being drawn.  He comes in today doing reasonably well.  He continues to note chest pain and exertional dyspnea that dates back to the time of his most recent cath in 03/2018.  He does state that the chest pain and exertional dyspnea are slightly worse when compared to how he was feeling at his last office visit in 06/2018.  He is uncertain if the symptoms feel similar to his prior angina.  He last had chest discomfort approximately 1 week ago.  His chest pain is not associated with exertion.  He does state he quit alcohol back in the summer 2019 though was seen in the ED for alcohol intoxication and 10/2018 as outlined above.  He continues to smoke 1/2 pack/day and is not interested in quitting.  He reports compliance with medications.  He denies any falls, BRBPR, dizziness, presyncope, syncope, lower extremity swelling, or palpitations.  He has stable two-pillow orthopnea.  He is currently chest pain/shortness of breath free.   Past Medical History:  Diagnosis Date   Anxiety    Bipolar affective ([place])    takes Lithium daily   Broken fingers    Coronary artery disease    a. 1997 PCI: RCA ([facility]); b. 2008 PCI: RCA; c. 05/2012 Cath: LCx 100 (previously stented - ? date), RCA stent w/ mild ISR, LAD nonobs, EF 35%; d. 06/2013 Cath: stable anatomy; e. 03/2018 Inf STEMI/PCI: LCX 100o/p ISR (CTO), RCA 135m/d (3.0x38 Resolute Onyx  DES), EF 40-45%.    Depression    GERD (gastroesophageal reflux disease)    takes Protonix daily   Hepatitis C    "diagnosed in the past 2 wk" (10/22/2012)   History of ETOH abuse    quit 2007   History of radiation therapy 12/17/17- 01/24/18   Larynx treated to 63 gy in 28 fx of 2.25 Gy   Hyperlipidemia    Hypertension    "pt. denies - no longer taking blood pressure medications"   Ischemic cardiomyopathy    a. 03/2018 LV gram: EF 40-45% @ time of MI.    Myocardial infarction ([place])    x 4   PAD (peripheral artery disease) ([place])    a. 2010 PTA & stent to left CIA & left SFA; b. 05/2012 PTA & stent to distal LCIA; c. 10/2012 thrombectomy and stents to [place]; d. 11/2014 s/p Ao-Bifem bypass.   Schizophrenia ([place])    Seizures ([place])    "when I get anxious" (Stated last seizure three months ago)   Tobacco abuse    Urinary frequency     Past Surgical History:  Procedure Laterality Date   ABDOMINAL AORTAGRAM N/A 10/22/2012   Procedure: ABDOMINAL Maxcine Ham;  Surgeon: Wellington Hampshire, MD;  Location: [facility] CATH LAB;  Service: Cardiovascular;  Laterality: N/A;   ABDOMINAL AORTAGRAM N/A 11/11/2014   Procedure: ABDOMINAL Maxcine Ham;  Surgeon: Wellington Hampshire, MD;  Location: [facility] CATH LAB;  Service: Cardiovascular;  Laterality: N/A;   ANTERIOR CERVICAL DECOMP/DISCECTOMY FUSION  01/2011   plate & screw placement    ANTERIOR CERVICAL DECOMP/DISCECTOMY FUSION N/A 01/26/2017   Procedure: ANTERIOR CERVICAL DECOMPRESSION/DISCECTOMY FUSION CERVICAL FOUR- CERVICAL FIVE;  Surgeon: Ashok Pall, MD;  Location: [facility];  Service: Neurosurgery;  Laterality: N/A;  ANTERIOR CERVICAL DECOMPRESSION/DISCECTOMY FUSION CERVICAL FOUR- CERVICAL FIVE   AORTA - BILATERAL FEMORAL ARTERY BYPASS GRAFT N/A 11/20/2014   Procedure: AORTA BIFEMORAL BYPASS GRAFT;  Surgeon: Mal Misty, MD;  Location: [facility];  Service: Vascular;  Laterality: N/A;   BACK SURGERY     BRAIN SURGERY  1989   subdural hematoma   CARDIAC CATHETERIZATION     Stent - 12   CHOLECYSTECTOMY N/A 06/29/2017   Procedure: LAPAROSCOPIC CHOLECYSTECTOMY WITH INTRAOPERATIVE CHOLANGIOGRAM, POSSIBLE OPEN;  Surgeon: Robert Bellow, MD;  Location: [facility] ORS;  Service: General;  Laterality: N/A;   CORONARY ANGIOPLASTY WITH STENT PLACEMENT  03/2007   to RCA   CORONARY/GRAFT ACUTE MI REVASCULARIZATION N/A 03/07/2018   Procedure: Coronary/Graft Acute MI Revascularization;  Surgeon: Isaias Cowman, MD;   Location: [facility] CV LAB;  Service: Cardiovascular;  Laterality: N/A;   HERNIA REPAIR     ILIAC ARTERY STENT  10/22/2012   "2" (10/22/2012)   ILIAC ARTERY STENT  06/2012   left   INCISIONAL HERNIA REPAIR N/A 11/08/2015   Procedure: INCISIONAL HERNIA REPAIR WITH MYOFASCIAL RELEASE;  Surgeon: Rolm Bookbinder, MD;  Location: [facility];  Service: General;  Laterality: N/A;   INSERTION OF MESH N/A 11/08/2015   Procedure: INSERTION OF MESH;  Surgeon: Rolm Bookbinder, MD;  Location: [facility];  Service: General;  Laterality: N/A;   LEFT HEART CATH AND CORONARY ANGIOGRAPHY N/A 03/07/2018   Procedure: LEFT HEART CATH AND CORONARY ANGIOGRAPHY;  Surgeon: Isaias Cowman, MD;  Location: [facility] CV LAB;  Service: Cardiovascular;  Laterality: N/A;   LEFT HEART CATHETERIZATION WITH CORONARY ANGIOGRAM N/A 05/29/2012   Procedure: LEFT HEART CATHETERIZATION WITH CORONARY ANGIOGRAM;  Surgeon: Wellington Hampshire, MD;  Location: [facility]  CATH LAB;  Service: Cardiovascular;  Laterality: N/A;   LIVER BIOPSY  10/2012   LOWER EXTREMITY ANGIOGRAM N/A 05/29/2012   Procedure: LOWER EXTREMITY ANGIOGRAM;  Surgeon: Wellington Hampshire, MD;  Location: [facility] CATH LAB;  Service: Cardiovascular;  Laterality: N/A;   MICROLARYNGOSCOPY WITH CO2 LASER AND EXCISION OF VOCAL CORD LESION N/A 11/21/2017   Procedure: MICROLARYNGOSCOPY WITH EXCISION OF VOCAL CORD LESION;  Surgeon: Jerrell Belfast, MD;  Location: [facility];  Service: ENT;  Laterality: N/A;   PERCUTANEOUS STENT INTERVENTION Left 05/29/2012   Procedure: PERCUTANEOUS STENT INTERVENTION;  Surgeon: Wellington Hampshire, MD;  Location: [facility] CATH LAB;  Service: Cardiovascular;  Laterality: Left;   POSTERIOR FUSION CERVICAL SPINE  2012   SUBDURAL HEMATOMA EVACUATION VIA CRANIOTOMY      Current Meds  Medication Sig   aspirin 81 MG chewable tablet Chew 1 tablet (81 mg total) by mouth daily.   atorvastatin (LIPITOR) 40 MG tablet Take 1 tablet (40 mg total) by mouth daily at 6 PM.     carvedilol (COREG) 3.125 MG tablet Take 3.125 mg by mouth 2 (two) times daily with a meal.    divalproex (DEPAKOTE ER) 500 MG 24 hr tablet Take 1,000 mg by mouth at bedtime.    lithium carbonate (ESKALITH) 450 MG CR tablet Take 450 mg by mouth 2 (two) times daily.   pantoprazole (PROTONIX) 40 MG tablet Take 40 mg by mouth daily at 2 PM. In the afternoon.   telmisartan (MICARDIS) 20 MG tablet Take 1 tablet (20 mg total) by mouth daily.   ticagrelor (BRILINTA) 90 MG TABS tablet Take 1 tablet (90 mg total) by mouth 2 (two) times daily.   VRAYLAR 4.5 MG CAPS Take 4.5 capsules by mouth at bedtime.    Allergies:   Lisinopril   Social History:  The patient  reports that he has been smoking cigarettes. He has a 40.00 pack-year smoking history. He has never used smokeless tobacco. He reports previous alcohol use. He reports that he does not use drugs.   Family History:  The patient's family history includes Alcohol abuse in an other family member; COPD in his mother; Diabetes in an other family member; Emphysema in his father; Hyperlipidemia in an other family member; Hypertension in an other family member; Seizures in an other family member.  ROS:   Review of Systems  Constitutional: Positive for malaise/fatigue. Negative for chills, diaphoresis, fever and weight loss.  HENT: Negative for congestion.   Eyes: Negative for discharge and redness.  Respiratory: Positive for shortness of breath. Negative for cough, hemoptysis, sputum production and wheezing.   Cardiovascular: Positive for chest pain. Negative for palpitations, orthopnea, claudication, leg swelling and PND.  Gastrointestinal: Negative for abdominal pain, blood in stool, heartburn, melena, nausea and vomiting.  Genitourinary: Negative for hematuria.  Musculoskeletal: Negative for falls and myalgias.  Skin: Negative for rash.  Neurological: Negative for dizziness, tingling, tremors, sensory change, speech change, focal weakness,  loss of consciousness and weakness.  Endo/Heme/Allergies: Does not bruise/bleed easily.  Psychiatric/Behavioral: Negative for substance abuse. The patient is nervous/anxious.      PHYSICAL EXAM:  VS:  BP (!) 168/110 (BP Location: Left Arm, Patient Position: Sitting, Cuff Size: Normal)    Pulse 68    Temp 97.9 F (36.6 C)    Ht 5\' 7"  (1.702 m)    Wt 133 lb 4 oz (60.4 kg)    SpO2 98%    BMI 20.87 kg/m  BMI: Body mass index is 20.87 kg/m.  Physical Exam  Constitutional: He is oriented to person, place, and time. He appears well-developed and well-nourished.  HENT:  Head: Normocephalic and atraumatic.  Eyes: Right eye exhibits no discharge. Left eye exhibits no discharge.  Neck: Normal range of motion. No JVD present.  Cardiovascular: Normal rate, regular rhythm, S1 normal, S2 normal and normal heart sounds. Exam reveals no distant heart sounds, no friction rub, no midsystolic click and no opening snap.  No murmur heard. Pulses:      Posterior tibial pulses are 2+ on the right side and 2+ on the left side.  Pulmonary/Chest: Effort normal and breath sounds normal. No respiratory distress. He has no decreased breath sounds. He has no wheezes. He has no rales. He exhibits no tenderness.  Abdominal: Soft. He exhibits no distension. There is no abdominal tenderness.  Musculoskeletal:        General: No edema.  Neurological: He is alert and oriented to person, place, and time.  Skin: Skin is warm and dry. No cyanosis. Nails show no clubbing.  Psychiatric: He has a normal mood and affect. His speech is normal and behavior is normal. Judgment and thought content normal.     EKG:  Was ordered and interpreted by me today. Shows NSR/WAP, 68 bpm, inferior Q waves, nonspecific ST-T changes (grossly unchanged from prior)  Recent Labs: 10/19/2018: BUN 8; Creatinine, Ser 0.84; Hemoglobin 15.7; Platelets 190; Potassium 4.1; Sodium 141  No results found for requested labs within last 8760 hours.    CrCl cannot be calculated (Patient's most recent lab result is older than the maximum 21 days allowed.).   Wt Readings from Last 3 Encounters:  06/04/19 133 lb 4 oz (60.4 kg)  10/19/18 150 lb (68 kg)  09/27/18 146 lb 12.8 oz (66.6 kg)     Other studies reviewed: Additional studies/records reviewed today include: summarized above  ASSESSMENT AND PLAN:  1. CAD involving the native coronaries with stable angina: Currently chest pain-free.  Patient indicates he has had continued mild chest discomfort that dates back to his most recent PCI in 03/2018.  He reports symptoms are slightly worse when compared to his visit in 06/2018.  Symptoms are not exertional.  It is uncertain if they feel similar to his prior angina.  He reports compliance with dual antiplatelet therapy.  Offered Lexiscan Myoview with patient declining.  He will remain on dual antiplatelet therapy indefinitely.  Given it has been more than 12 months from his most recent PCI, and at the recommendation of prior note, we will decrease his Brilinta 60 mg twice daily.  He will continue aspirin 81 mg daily.  Aggressive risk factor modification and secondary prevention.  Check CBC.  2. HFrEF secondary to ICM: He appears grossly euvolemic and well compensated today.Marland Kitchen  His weight is down 4 pounds when compared to his last office visit in 06/2017.  I do have hesitations escalating his carvedilol therapy at this time given prior documented heart rates in the 50s bpm with a current heart rate of 68 bpm.  Titrate telmisartan as below.  In follow-up, consider addition of spironolactone.  Given slight worsening of exertional dyspnea obtain echocardiogram.  Based on these findings, we may need to revisit ischemic evaluation which was declined today as above.  3. PAD: status post prior bilateral iliac stenting and more recent aortobifemoral bypass in 11/2014. Followed by [facility].  4. HTN: Blood pressure is suboptimally controlled today at 168/110.   Hesitant to escalate carvedilol as outlined above given prior documented  bradycardic heart rates.  Increase telmisartan to 40 mg daily.  5. HLD: LDL of 70 from 03/2018.  Remains on atorvastatin 40 mg daily.  Check CMP given excessive alcohol use.  Check lipid panel and direct LDL.  6. Alcohol/tobacco abuse: Patient states his last drink of alcohol was last summer.  Chart biopsy indicates patient was seen in the ED in 10/2018 for alcohol intoxication with an ethanol level of 203.  He continues to smoke half pack daily and is not interested in quitting.  Complete cessation is recommended.  7. Transaminitis: Likely in setting of his hepatitis C and excessive alcohol abuse.  Check CMP.  8. Anxiety: Patient asked for a prescription of Xanax today.  This was declined and he was advised to follow-up with his PCP.  Of note, patient was seen in the ED in 10/2018 for alcohol intoxication and self-reported taking 3 Xanax at that time.  Anxiety likely exacerbating his hypertension.  Disposition: F/u with Dr. Fletcher Anon or an APP in 2 months.  Current medicines are reviewed at length with the patient today.  The patient did not have any concerns regarding medicines.  Signed, Christell Faith, PA-C 06/04/2019 3:49 PM     [facility] [address] [place] [address] [place], [place] [address] [phone]

## 2019-06-02 NOTE — Telephone Encounter (Signed)
Na x 2; left a message for the patient to contact our office regarding his refill request.

## 2019-06-03 ENCOUNTER — Other Ambulatory Visit: Payer: Self-pay | Admitting: Cardiovascular Disease

## 2019-06-03 ENCOUNTER — Telehealth: Payer: Self-pay

## 2019-06-03 MED ORDER — TELMISARTAN 20 MG PO TABS: 20.0000 mg | ORAL_TABLET | Freq: Every day | ORAL | 0 refills | Status: DC

## 2019-06-03 NOTE — Telephone Encounter (Signed)
    COVID-19 Pre-Screening Questions:  . In the past 7 to 10 days have you had a cough, shortness of breath, headache, congestion, fever (100 or greater), body aches, chills, sore throat, or sudden loss of taste or sense of smell? NO . Have you been around anyone with known Covid 19? NO . Have you been around anyone who is awaiting Covid 19 test results in the past 7 to 10 days? NO . Have you been around anyone who has been exposed to Covid 19, or has mentioned symptoms of Covid 19 within the past 7 to 10 days? NO  If you have any concerns/questions about symptoms patients report during screening (either on the phone or at threshold). Contact the provider seeing the patient or DOD for further guidance.  If neither are available contact a member of the leadership team.            

## 2019-06-03 NOTE — Telephone Encounter (Signed)
*  STAT* If patient is at the pharmacy, call can be transferred to refill team.   1. Which medications need to be refilled? (please list name of each medication and dose if known)  [place] 2. Which pharmacy/location (including street and city if local pharmacy) is medication to be sent to? [facility] 3. Do they need a 30 day or 90 day supply?  [place]

## 2019-06-04 ENCOUNTER — Ambulatory Visit (INDEPENDENT_AMBULATORY_CARE_PROVIDER_SITE_OTHER): Payer: Medicaid Other | Admitting: Physician Assistant

## 2019-06-04 ENCOUNTER — Encounter: Payer: Self-pay | Admitting: Physician Assistant

## 2019-06-04 ENCOUNTER — Other Ambulatory Visit: Payer: Self-pay

## 2019-06-04 VITALS — BP 168/110 | HR 68 | Temp 97.9°F | Ht 67.0 in | Wt 133.2 lb

## 2019-06-04 DIAGNOSIS — R0602 Shortness of breath: Secondary | ICD-10-CM | POA: Diagnosis not present

## 2019-06-04 DIAGNOSIS — I5022 Chronic systolic (congestive) heart failure: Secondary | ICD-10-CM

## 2019-06-04 DIAGNOSIS — I25118 Atherosclerotic heart disease of native coronary artery with other forms of angina pectoris: Secondary | ICD-10-CM

## 2019-06-04 DIAGNOSIS — Z720 Tobacco use: Secondary | ICD-10-CM

## 2019-06-04 DIAGNOSIS — E785 Hyperlipidemia, unspecified: Secondary | ICD-10-CM

## 2019-06-04 DIAGNOSIS — F1010 Alcohol abuse, uncomplicated: Secondary | ICD-10-CM

## 2019-06-04 DIAGNOSIS — I10 Essential (primary) hypertension: Secondary | ICD-10-CM

## 2019-06-04 DIAGNOSIS — I255 Ischemic cardiomyopathy: Secondary | ICD-10-CM

## 2019-06-04 DIAGNOSIS — I739 Peripheral vascular disease, unspecified: Secondary | ICD-10-CM

## 2019-06-04 MED ORDER — TELMISARTAN 40 MG PO TABS: 40.0000 mg | ORAL_TABLET | Freq: Every day | ORAL | 2 refills | Status: DC

## 2019-06-04 MED ORDER — TICAGRELOR 60 MG PO TABS: 60.0000 mg | ORAL_TABLET | Freq: Two times a day (BID) | ORAL | 2 refills | Status: DC

## 2019-06-04 NOTE — Patient Instructions (Addendum)
Medication Instructions:  Your physician has recommended you make the following change in your medication:  1- DECREASE Brilinta to 60 mg by mouth two times a day. 2- INCREASE Telmisartin to 40 mg by mouth once a day.  If you need a refill on your cardiac medications before your next appointment, please call your pharmacy.   Lab work: Your physician recommends that you return for lab work in: TODAY - CMET, LIPID, DIRECT LDL, CBC).  If you have labs (blood work) drawn today and your tests are completely normal, you will receive your results only by: Marland Kitchen MyChart Message (if you have MyChart) OR . A paper copy in the mail If you have any lab test that is abnormal or we need to change your treatment, we will call you to review the results.  Testing/Procedures: Your physician has requested that you have an echocardiogram. Echocardiography is a painless test that uses sound waves to create images of your heart. It provides your doctor with information about the size and shape of your heart and how well your heart's chambers and valves are working. This procedure takes approximately one hour. There are no restrictions for this procedure. You may get an IV, if needed, to receive an ultrasound enhancing agent through to better visualize your heart.     Follow-Up: At [facility], you and your health needs are our priority.  As part of our continuing mission to provide you with exceptional heart care, we have created designated Provider Care Teams.  These Care Teams include your primary Cardiologist (physician) and Advanced Practice Providers (APPs -  Physician Assistants and Nurse Practitioners) who all work together to provide you with the care you need, when you need it. You will need a follow up appointment in 2 months.  Please call our office 2 months in advance to schedule this appointment.  You may see Kathlyn Sacramento, MD or one of the following Advanced Practice Providers on your designated  Care Team:   Murray Hodgkins, NP Christell Faith, PA-C . Marrianne Mood, PA-C     Echocardiogram An echocardiogram is a procedure that uses painless sound waves (ultrasound) to produce an image of the heart. Images from an echocardiogram can provide important information about:  Signs of coronary artery disease (CAD).  Aneurysm detection. An aneurysm is a weak or damaged part of an artery wall that bulges out from the normal force of blood pumping through the body.  Heart size and shape. Changes in the size or shape of the heart can be associated with certain conditions, including heart failure, aneurysm, and CAD.  Heart muscle function.  Heart valve function.  Signs of a past heart attack.  Fluid buildup around the heart.  Thickening of the heart muscle.  A tumor or infectious growth around the heart valves. Tell a health care provider about:  Any allergies you have.  All medicines you are taking, including vitamins, herbs, eye drops, creams, and over-the-counter medicines.  Any blood disorders you have.  Any surgeries you have had.  Any medical conditions you have.  Whether you are pregnant or may be pregnant. What are the risks? Generally, this is a safe procedure. However, problems may occur, including:  Allergic reaction to dye (contrast) that may be used during the procedure. What happens before the procedure? No specific preparation is needed. You may eat and drink normally. What happens during the procedure?   An IV tube may be inserted into one of your veins.  You may receive  contrast through this tube. A contrast is an injection that improves the quality of the pictures from your heart.  A gel will be applied to your chest.  A wand-like tool (transducer) will be moved over your chest. The gel will help to transmit the sound waves from the transducer.  The sound waves will harmlessly bounce off of your heart to allow the heart images to be captured in  real-time motion. The images will be recorded on a computer. The procedure may vary among health care providers and hospitals. What happens after the procedure?  You may return to your normal, everyday life, including diet, activities, and medicines, unless your health care provider tells you not to do that. Summary  An echocardiogram is a procedure that uses painless sound waves (ultrasound) to produce an image of the heart.  Images from an echocardiogram can provide important information about the size and shape of your heart, heart muscle function, heart valve function, and fluid buildup around your heart.  You do not need to do anything to prepare before this procedure. You may eat and drink normally.  After the echocardiogram is completed, you may return to your normal, everyday life, unless your health care provider tells you not to do that. This information is not intended to replace advice given to you by your health care provider. Make sure you discuss any questions you have with your health care provider. Document Released: 11/17/2000 Document Revised: 03/13/2019 Document Reviewed: 12/23/2016 Elsevier Patient Education  2020 [employer].

## 2019-06-05 LAB — CBC WITH DIFFERENTIAL/PLATELET
Basophils Absolute: 0.1 10*3/uL (ref 0.0–0.2)
Basos: 1 %
EOS (ABSOLUTE): 0.2 10*3/uL (ref 0.0–0.4)
Eos: 3 %
Hematocrit: 44.8 % (ref 37.5–51.0)
Hemoglobin: 15.3 g/dL (ref 13.0–17.7)
Immature Grans (Abs): 0 10*3/uL (ref 0.0–0.1)
Immature Granulocytes: 0 %
Lymphocytes Absolute: 1.2 10*3/uL (ref 0.7–3.1)
Lymphs: 21 %
MCH: 32.6 pg (ref 26.6–33.0)
MCHC: 34.2 g/dL (ref 31.5–35.7)
MCV: 95 fL (ref 79–97)
Monocytes Absolute: 0.5 10*3/uL (ref 0.1–0.9)
Monocytes: 9 %
Neutrophils Absolute: 3.6 10*3/uL (ref 1.4–7.0)
Neutrophils: 66 %
Platelets: 145 10*3/uL — ABNORMAL LOW (ref 150–450)
RBC: 4.7 x10E6/uL (ref 4.14–5.80)
RDW: 12.3 % (ref 11.6–15.4)
WBC: 5.5 10*3/uL (ref 3.4–10.8)

## 2019-06-05 LAB — COMPREHENSIVE METABOLIC PANEL
ALT: 67 IU/L — ABNORMAL HIGH (ref 0–44)
AST: 47 IU/L — ABNORMAL HIGH (ref 0–40)
Albumin/Globulin Ratio: 2 (ref 1.2–2.2)
Albumin: 4.7 g/dL (ref 3.8–4.9)
Alkaline Phosphatase: 51 IU/L (ref 39–117)
BUN/Creatinine Ratio: 10 (ref 9–20)
BUN: 8 mg/dL (ref 6–24)
Bilirubin Total: 0.7 mg/dL (ref 0.0–1.2)
CO2: 23 mmol/L (ref 20–29)
Calcium: 10.9 mg/dL — ABNORMAL HIGH (ref 8.7–10.2)
Chloride: 100 mmol/L (ref 96–106)
Creatinine, Ser: 0.83 mg/dL (ref 0.76–1.27)
GFR calc Af Amer: 111 mL/min/{1.73_m2} (ref >59)
GFR calc non Af Amer: 96 mL/min/{1.73_m2} (ref >59)
Globulin, Total: 2.4 g/dL (ref 1.5–4.5)
Glucose: 107 mg/dL — ABNORMAL HIGH (ref 65–99)
Potassium: 4.8 mmol/L (ref 3.5–5.2)
Sodium: 137 mmol/L (ref 134–144)
Total Protein: 7.1 g/dL (ref 6.0–8.5)

## 2019-06-05 LAB — LIPID PANEL
Chol/HDL Ratio: 2.3 ratio (ref 0.0–5.0)
Cholesterol, Total: 137 mg/dL (ref 100–199)
HDL: 59 mg/dL (ref >39)
LDL Calculated: 61 mg/dL (ref 0–99)
Triglycerides: 83 mg/dL (ref 0–149)
VLDL Cholesterol Cal: 17 mg/dL (ref 5–40)

## 2019-06-05 LAB — LDL CHOLESTEROL, DIRECT: LDL Direct: 64 mg/dL (ref 0–99)

## 2019-06-27 ENCOUNTER — Other Ambulatory Visit: Payer: Medicaid Other

## 2019-07-02 ENCOUNTER — Other Ambulatory Visit: Payer: Medicaid Other

## 2019-07-22 ENCOUNTER — Other Ambulatory Visit: Payer: Medicaid Other

## 2019-08-15 ENCOUNTER — Emergency Department: Payer: Medicaid Other

## 2019-08-15 ENCOUNTER — Other Ambulatory Visit: Payer: Self-pay

## 2019-08-15 DIAGNOSIS — I10 Essential (primary) hypertension: Secondary | ICD-10-CM | POA: Diagnosis not present

## 2019-08-15 DIAGNOSIS — Z79899 Other long term (current) drug therapy: Secondary | ICD-10-CM | POA: Insufficient documentation

## 2019-08-15 DIAGNOSIS — K2970 Gastritis, unspecified, without bleeding: Secondary | ICD-10-CM | POA: Insufficient documentation

## 2019-08-15 DIAGNOSIS — I2510 Atherosclerotic heart disease of native coronary artery without angina pectoris: Secondary | ICD-10-CM | POA: Diagnosis not present

## 2019-08-15 DIAGNOSIS — Z959 Presence of cardiac and vascular implant and graft, unspecified: Secondary | ICD-10-CM | POA: Diagnosis not present

## 2019-08-15 DIAGNOSIS — Z7982 Long term (current) use of aspirin: Secondary | ICD-10-CM | POA: Insufficient documentation

## 2019-08-15 DIAGNOSIS — F17210 Nicotine dependence, cigarettes, uncomplicated: Secondary | ICD-10-CM | POA: Insufficient documentation

## 2019-08-15 DIAGNOSIS — R079 Chest pain, unspecified: Secondary | ICD-10-CM | POA: Diagnosis present

## 2019-08-15 LAB — BASIC METABOLIC PANEL
Anion gap: 10 (ref 5–15)
BUN: 9 mg/dL (ref 6–20)
CO2: 22 mmol/L (ref 22–32)
Calcium: 9.1 mg/dL (ref 8.9–10.3)
Chloride: 106 mmol/L (ref 98–111)
Creatinine, Ser: 0.65 mg/dL (ref 0.61–1.24)
GFR calc Af Amer: >60 mL/min (ref >60)
GFR calc non Af Amer: >60 mL/min (ref >60)
Glucose, Bld: 95 mg/dL (ref 70–99)
Potassium: 4.2 mmol/L (ref 3.5–5.1)
Sodium: 138 mmol/L (ref 135–145)

## 2019-08-15 LAB — CBC
HCT: 42.7 % (ref 39.0–52.0)
Hemoglobin: 14.7 g/dL (ref 13.0–17.0)
MCH: 33.8 pg (ref 26.0–34.0)
MCHC: 34.4 g/dL (ref 30.0–36.0)
MCV: 98.2 fL (ref 80.0–100.0)
Platelets: 138 10*3/uL — ABNORMAL LOW (ref 150–400)
RBC: 4.35 MIL/uL (ref 4.22–5.81)
RDW: 12.9 % (ref 11.5–15.5)
WBC: 7.2 10*3/uL (ref 4.0–10.5)
nRBC: 0 % (ref 0.0–0.2)

## 2019-08-15 LAB — TROPONIN I (HIGH SENSITIVITY): Troponin I (High Sensitivity): 14 ng/L (ref <18)

## 2019-08-15 MED ORDER — SODIUM CHLORIDE 0.9% FLUSH: 3.0000 mL | Freq: Once | INTRAVENOUS | Status: DC

## 2019-08-15 NOTE — ED Notes (Signed)
Patient transported to X-ray 

## 2019-08-15 NOTE — ED Notes (Signed)
Patient to stat desk yelling that he needs help. Patient again informed that he has had vital signs, ekg, blood work and chest xray and that he would be taken back to be evaluated by an md when his bed becomes available.

## 2019-08-15 NOTE — ED Notes (Signed)
Patient to stat desk stating that he is hurting. Patient again informed that he will be taken to a room to be evaluated when his room becomes available.

## 2019-08-15 NOTE — ED Triage Notes (Signed)
Patient reports having chest pain off/on for the past 3 days.  Reports cardiac history.  Patient also reports heavy smoking and drinking history.

## 2019-08-15 NOTE — ED Notes (Signed)
Patient to stat desk yelling at security that he would like water and that he is having chest pains and we are not doing anything for him. Patient again informed that he has had his vital signs checked, lab work and chest xray done.

## 2019-08-15 NOTE — ED Notes (Signed)
Patient yelling across that lobby stating that he needs help. Patient in no acute distress at this time. Explained to patient that he had been triaged and vital signs taken and that blood work has been sent to the lab. Patient informed that he would be taken back to a room to be evaluated by an MD when a room becomes available for him.

## 2019-08-16 ENCOUNTER — Emergency Department
Admission: EM | Admit: 2019-08-16 | Discharge: 2019-08-16 | Disposition: A | Discharge status: Home / Self Care | Payer: Medicaid Other | Attending: Emergency Medicine | Admitting: Emergency Medicine

## 2019-08-16 DIAGNOSIS — K2970 Gastritis, unspecified, without bleeding: Secondary | ICD-10-CM

## 2019-08-16 DIAGNOSIS — R079 Chest pain, unspecified: Secondary | ICD-10-CM

## 2019-08-16 LAB — TROPONIN I (HIGH SENSITIVITY): Troponin I (High Sensitivity): 15 ng/L (ref <18)

## 2019-08-16 LAB — ETHANOL: Alcohol, Ethyl (B): 345 mg/dL (ref <10)

## 2019-08-16 MED ORDER — ALUM & MAG HYDROXIDE-SIMETH 200-200-20 MG/5ML PO SUSP
30.0000 mL | Freq: Once | ORAL | Status: AC
  Administered 2019-08-16: 30 mL via ORAL
  Filled 2019-08-16: qty 30

## 2019-08-16 MED ORDER — LIDOCAINE VISCOUS HCL 2 % MT SOLN
15.0000 mL | Freq: Once | OROMUCOSAL | Status: AC
  Administered 2019-08-16: 15 mL via ORAL
  Filled 2019-08-16: qty 15

## 2019-08-16 MED ORDER — SUCRALFATE 1 G PO TABS: 1.0000 g | ORAL_TABLET | Freq: Four times a day (QID) | ORAL | 0 refills | Status: DC

## 2019-08-16 MED ORDER — FAMOTIDINE 40 MG PO TABS: 40.0000 mg | ORAL_TABLET | Freq: Every evening | ORAL | 1 refills | Status: DC

## 2019-08-16 NOTE — ED Notes (Signed)
Pt states he feels a little better after the GI cocktail. Pt continues to refuse vitals.

## 2019-08-16 NOTE — ED Provider Notes (Signed)
[facility] Emergency Department Provider Note   ____________________________________________   I have reviewed the triage vital signs and the nursing notes.   HISTORY  Chief Complaint Chest Pain   History limited by: Not Limited   HPI [name] is a 58 y.o. male who presents to the emergency department today because of concern for chest pain. He states he has been having the pain on and off for the past week. It is located in his left chest with radiation down his left arm. He states that today's episode started yesterday morning when he woke up. Tried taking a nitroglycerin without any relief yesterday. The patient states that he did drink alcohol today. Denies any recent trauma.    Records reviewed. Per medical record review patient has a history of CAD  Past Medical History:  Diagnosis Date  . Anxiety   . Bipolar affective ([place])    takes Lithium daily  . Broken fingers   . Coronary artery disease    a. 1997 PCI: RCA ([facility]); b. 2008 PCI: RCA; c. 05/2012 Cath: LCx 100 (previously stented - ? date), RCA stent w/ mild ISR, LAD nonobs, EF 35%; d. 06/2013 Cath: stable anatomy; e. 03/2018 Inf STEMI/PCI: LCX 100o/p ISR (CTO), RCA 175m/d (3.0x38 Resolute Onyx DES), EF 40-45%.   . Depression   . GERD (gastroesophageal reflux disease)    takes Protonix daily  . Hepatitis C    "diagnosed in the past 2 wk" (10/22/2012)  . History of ETOH abuse    quit 2007  . History of radiation therapy 12/17/17- 01/24/18   Larynx treated to 63 gy in 28 fx of 2.25 Gy  . Hyperlipidemia   . Hypertension    "pt. denies - no longer taking blood pressure medications"  . Ischemic cardiomyopathy    a. 03/2018 LV gram: EF 40-45% @ time of MI.  Marland Kitchen Myocardial infarction ([place])    x 4  . PAD (peripheral artery disease) ([place])    a. 2010 PTA & stent to left CIA & left SFA; b. 05/2012 PTA & stent to distal LCIA; c. 10/2012 thrombectomy and stents to [place]; d. 11/2014 s/p  Ao-Bifem bypass.  . Schizophrenia ([place])   . Seizures ([place])    "when I get anxious" (Stated last seizure three months ago)  . Tobacco abuse   . Urinary frequency     Patient Active Problem List   Diagnosis Date Noted  . Malnutrition of moderate degree 03/09/2018  . STEMI involving oth coronary artery of inferior wall ([place]) 03/07/2018  . 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 Cholecystitis 06/29/2017  . Osteoarthritis of spine with radiculopathy, cervical region 01/26/2017  . Gallbladder polyp 06/01/2016  . Rib pain on right side 06/01/2016  . ETOH abuse 11/24/2014  . Bipolar disorder, currently in remission ([place]) 11/24/2014  . Alcohol dependence with withdrawal with complication ([place]) [id number]  . Chronic hepatitis C ([place]) 09/12/2013  . Cardiomyopathy, ischemic 06/09/2013  . PAD (peripheral artery disease) ([place]) 05/23/2012  . HTN (hypertension) 10/09/2011  . CAROTID BRUIT 12/19/2010  . DYSPNEA 06/15/2010  . Hyperlipidemia 05/06/2010  . TOBACCO USER 05/06/2010  . CAD, NATIVE VESSEL 05/06/2010  . 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 ABDOMINAL AORTAGRAM N/A 10/22/2012   Procedure: ABDOMINAL Maxcine Ham;  Surgeon: Wellington Hampshire, MD;  Location: [facility];  Service: Cardiovascular;  Laterality: N/A;  . ABDOMINAL AORTAGRAM N/A 11/11/2014   Procedure: ABDOMINAL AORTAGRAM;  Surgeon: Wellington Hampshire, MD;  Location: [facility];  Service: Cardiovascular;  Laterality: N/A;  . ANTERIOR CERVICAL DECOMP/DISCECTOMY FUSION  01/2011   plate & screw placement   . ANTERIOR CERVICAL DECOMP/DISCECTOMY FUSION N/A 01/26/2017   Procedure: ANTERIOR CERVICAL DECOMPRESSION/DISCECTOMY FUSION CERVICAL FOUR- CERVICAL FIVE;  Surgeon: Ashok Pall, MD;  Location: [facility];  Service: Neurosurgery;  Laterality: N/A;  ANTERIOR CERVICAL DECOMPRESSION/DISCECTOMY FUSION  CERVICAL FOUR- CERVICAL FIVE  . AORTA - BILATERAL FEMORAL ARTERY BYPASS GRAFT N/A 11/20/2014   Procedure: AORTA BIFEMORAL BYPASS GRAFT;  Surgeon: Mal Misty, MD;  Location: [facility];  Service: Vascular;  Laterality: N/A;  . BACK SURGERY    . BRAIN SURGERY  1989   subdural hematoma  . CARDIAC CATHETERIZATION     Stent - 12  . CHOLECYSTECTOMY N/A 06/29/2017   Procedure: LAPAROSCOPIC CHOLECYSTECTOMY WITH INTRAOPERATIVE CHOLANGIOGRAM, POSSIBLE OPEN;  Surgeon: Robert Bellow, MD;  Location: [facility];  Service: General;  Laterality: N/A;  . CORONARY ANGIOPLASTY WITH STENT PLACEMENT  03/2007   to RCA  . CORONARY/GRAFT ACUTE MI REVASCULARIZATION N/A 03/07/2018   Procedure: Coronary/Graft Acute MI Revascularization;  Surgeon: Isaias Cowman, MD;  Location: [facility];  Service: Cardiovascular;  Laterality: N/A;  . HERNIA REPAIR    . ILIAC ARTERY STENT  10/22/2012   "2" (10/22/2012)  . ILIAC ARTERY STENT  06/2012   left  . INCISIONAL HERNIA REPAIR N/A 11/08/2015   Procedure: INCISIONAL HERNIA REPAIR WITH MYOFASCIAL RELEASE;  Surgeon: Rolm Bookbinder, MD;  Location: [facility];  Service: General;  Laterality: N/A;  . INSERTION OF MESH N/A 11/08/2015   Procedure: INSERTION OF MESH;  Surgeon: Rolm Bookbinder, MD;  Location: [facility];  Service: General;  Laterality: N/A;  . LEFT HEART CATH AND CORONARY ANGIOGRAPHY N/A 03/07/2018   Procedure: LEFT HEART CATH AND CORONARY ANGIOGRAPHY;  Surgeon: Isaias Cowman, MD;  Location: [facility];  Service: Cardiovascular;  Laterality: N/A;  . LEFT HEART CATHETERIZATION WITH CORONARY ANGIOGRAM N/A 05/29/2012   Procedure: LEFT HEART CATHETERIZATION WITH CORONARY ANGIOGRAM;  Surgeon: Wellington Hampshire, MD;  Location: [facility];  Service: Cardiovascular;  Laterality: N/A;  . LIVER BIOPSY  10/2012  . LOWER EXTREMITY ANGIOGRAM N/A 05/29/2012   Procedure: LOWER EXTREMITY ANGIOGRAM;  Surgeon: Wellington Hampshire, MD;  Location: [facility];  Service:  Cardiovascular;  Laterality: N/A;  . MICROLARYNGOSCOPY WITH CO2 LASER AND EXCISION OF VOCAL CORD LESION N/A 11/21/2017   Procedure: MICROLARYNGOSCOPY WITH EXCISION OF VOCAL CORD LESION;  Surgeon: Jerrell Belfast, MD;  Location: [facility];  Service: ENT;  Laterality: N/A;  . PERCUTANEOUS STENT INTERVENTION Left 05/29/2012   Procedure: PERCUTANEOUS STENT INTERVENTION;  Surgeon: Wellington Hampshire, MD;  Location: [facility];  Service: Cardiovascular;  Laterality: Left;  . POSTERIOR FUSION CERVICAL SPINE  2012  . SUBDURAL HEMATOMA EVACUATION VIA CRANIOTOMY      Prior to Admission medications   Medication Sig Start Date End Date Taking? Authorizing Provider  alprazolam Duanne Moron) 2 MG tablet Take 1-2 mg by mouth 2 (two) times daily. 10/04/18   [provider]  aspirin 81 MG chewable tablet Chew 1 tablet (81 mg total) by mouth daily. 03/10/18   Saundra Shelling, MD  atorvastatin (LIPITOR) 40 MG tablet Take 1 tablet (40 mg total) by mouth daily at 6 PM. 03/09/18   Pyreddy, Reatha Harps, MD  carvedilol (COREG) 3.125 MG tablet Take 3.125 mg  by mouth 2 (two) times daily with a meal.     [provider]  divalproex (DEPAKOTE ER) 500 MG 24 hr tablet Take 1,000 mg by mouth at bedtime.  06/17/17   [provider]  lithium carbonate (ESKALITH) 450 MG CR tablet Take 450 mg by mouth 2 (two) times daily.    [provider]  nitroGLYCERIN (NITROSTAT) 0.4 MG SL tablet Place 1 tablet (0.4 mg total) under the tongue every 5 (five) minutes as needed for chest pain. 03/14/18 06/13/18  Theora Gianotti, NP  pantoprazole (PROTONIX) 40 MG tablet Take 40 mg by mouth daily at 2 PM. In the afternoon. 06/10/17   [provider]  telmisartan (MICARDIS) 40 MG tablet Take 1 tablet (40 mg total) by mouth daily. 06/04/19   Rise Mu, PA-C  ticagrelor (BRILINTA) 60 MG TABS tablet Take 1 tablet (60 mg total) by mouth 2 (two) times daily. 06/04/19   Dunn, Ryan M, PA-C  VRAYLAR 4.5 MG CAPS Take 4.5 capsules  by mouth at bedtime. 10/01/18   [provider]    Allergies Lisinopril  Family History  Problem Relation Age of Onset  . Emphysema Father   . COPD Mother   . Diabetes Other   . Alcohol abuse Other   . Hypertension Other   . Hyperlipidemia Other   . Seizures Other     Social History Social History   Tobacco Use  . Smoking status: Current Every Day Smoker    Packs/day: 1.00    Years: 40.00    Pack years: 40.00    Types: Cigarettes  . Smokeless tobacco: Never Used  . Tobacco comment: "ive slowed down recently"   Substance Use Topics  . Alcohol use: Not Currently    Alcohol/week: 0.0 standard drinks    Comment: he tells me that he stopped drinking two months ago- 09/27/18  . Drug use: No    Review of Systems Constitutional: No fever/chills Eyes: No visual changes. ENT: No sore throat. Cardiovascular: Positive for chest pain. Respiratory: Denies shortness of breath. Gastrointestinal: No abdominal pain.  No nausea, no vomiting.  No diarrhea.   Genitourinary: Negative for dysuria. Musculoskeletal: Positive for left arm pain. Skin: Negative for rash. Neurological: Negative for headaches, focal weakness or numbness.  ____________________________________________   PHYSICAL EXAM:  VITAL SIGNS: ED Triage Vitals  Enc Vitals Group     BP 08/15/19 2212 (!) 181/112     Pulse Rate 08/15/19 2212 91     Resp 08/15/19 2212 (!) 22     Temp 08/15/19 2212 98.2 F (36.8 C)     Temp Source 08/15/19 2212 Oral     SpO2 08/15/19 2212 96 %     Weight 08/15/19 2208 120 lb (54.4 kg)     Height 08/15/19 2208 5\' 7"  (1.702 m)     Head Circumference --      Peak Flow --      Pain Score 08/15/19 2207 8   Constitutional: Alert and oriented.  Eyes: Conjunctivae are normal.  ENT      Head: Normocephalic and atraumatic.      Nose: No congestion/rhinnorhea.      Mouth/Throat: Mucous membranes are moist.      Neck: No stridor. Hematological/Lymphatic/Immunilogical: No  cervical lymphadenopathy. Cardiovascular: Normal rate, regular rhythm.  No murmurs, rubs, or gallops.  Respiratory: Normal respiratory effort without tachypnea nor retractions. Breath sounds are clear and equal bilaterally. No wheezes/rales/rhonchi. Gastrointestinal: Soft and non tender. No rebound. No guarding.  Genitourinary: Deferred Musculoskeletal: Normal range of motion in all extremities. No lower extremity edema. Neurologic:  Normal speech and language. No gross focal neurologic deficits are appreciated.  Skin:  Skin is warm, dry and intact. No rash noted. Psychiatric: Mood and affect are normal. Speech and behavior are normal. Patient exhibits appropriate insight and judgment.  ____________________________________________    LABS (pertinent positives/negatives)  Ethanol 345 BMP wnl Trop hs 14 -> 15 CBC wbc 7.2, hgb 14.7, plt 138  ____________________________________________   EKG  I, Nance Pear, attending physician, personally viewed and interpreted this EKG  EKG Time: 2208 Rate: 88 Rhythm: normal sinus rhythm Axis: left axis deviation Intervals: qtc 459 QRS: narrow, q waves II, III, aVF ST changes: no st elevation Impression: abnormal ekg   ____________________________________________    RADIOLOGY  CXR No acute abnormality  ____________________________________________   PROCEDURES  Procedures  ____________________________________________   INITIAL IMPRESSION / ASSESSMENT AND PLAN / ED COURSE  Pertinent labs & imaging results that were available during my care of the patient were reviewed by me and considered in my medical decision making (see chart for details).   Patient presented to the emergency department today because of concerns for chest pain.  Patient does admit to heavy alcohol use.  Chest pain is been intermittent over the past week.  Initial troponin 14 with repeat of 15.  Patient chest x-ray without concerning pneumothorax or  pneumonia.  Patient was given GI cocktail and said that he got significant improvement of his pain.  Do wonder if patient is suffering from gastritis likely secondary to heavy alcohol use.  I discussed this with the patient.  Discussed with patient we will put him on prescriptions for gastritis.  Discouraged further alcohol use.   ____________________________________________   FINAL CLINICAL IMPRESSION(S) / ED DIAGNOSES  Final diagnoses:  Nonspecific chest pain  Gastritis, presence of bleeding unspecified, unspecified chronicity, unspecified gastritis type     Note: This dictation was prepared with Dragon dictation. Any transcriptional errors that result from this process are unintentional     Nance Pear, MD 08/16/19 [phone]

## 2019-08-16 NOTE — ED Notes (Signed)
Entered patient room to start evaluation and found patient pacing around the room. Pt immediately started complaining that he had been here for hours and his treatment was "ridiculous". When asked if he would sit on the bed so vitals could be assessed and an cardiac monitoring started he stated "I'm better walking, I don't want to sit down". The importance of vitals in his continuing care was explained and he refused to allow them. Pt demanded to see the doctor immediately. Dr Archie Balboa notified.

## 2019-08-16 NOTE — ED Notes (Signed)
Patient to stat desk stating that he is hurting. Patient informed that we need to repeat his lab work. Patient update that there is one person ahead of him at this time.

## 2019-08-16 NOTE — ED Notes (Signed)
Date and time results received: 08/16/19  (use smartphrase ".now" to insert current time)  Test: ETOH Critical Value: 345  Name of Provider Notified: Dr. Beather Arbour  Orders Received? Or Actions Taken?: acknowledged

## 2019-08-16 NOTE — ED Notes (Addendum)
Went to discharge patient and found that he had left. Pt did not receive discharge instructions or information about prescriptions, no final assessment or vitals performed EDP and charge nurse notified.

## 2019-08-16 NOTE — ED Notes (Signed)
Patient to stat desk asking "if we are trying to punish him". Patient informed that we are not trying to punish him. Patient walked away from desk before this RN able to explain that we are going to repeat his blood work.

## 2019-08-16 NOTE — ED Notes (Addendum)
Pt declined IV; pt prefer straight stick   Pt asking this RN if Wilfred Lacy, EDT is this RN's wife, after orienting pt to appropriate behavior, pt insisting Wilfred Lacy is a male

## 2019-08-16 NOTE — ED Notes (Signed)
Patient called for repeat troponin. Patient to stat desk yelling at registration and then walked out of the lobby.

## 2019-08-16 NOTE — Discharge Instructions (Addendum)
Please seek medical attention for any high fevers, chest pain, shortness of breath, change in behavior, persistent vomiting, bloody stool or any other new or concerning symptoms.  

## 2019-08-16 NOTE — ED Notes (Signed)
Patient to stat desk stating that we are punishing him again. Patient given an update on wait time.

## 2019-08-25 ENCOUNTER — Emergency Department: Payer: Medicaid Other

## 2019-08-25 ENCOUNTER — Inpatient Hospital Stay
Admission: EM | Admit: 2019-08-25 | Discharge: 2019-08-26 | DRG: 101 | Disposition: A | Discharge status: Other Acute Inpatient Hospital | Payer: Medicaid Other | Attending: Internal Medicine | Admitting: Internal Medicine

## 2019-08-25 ENCOUNTER — Other Ambulatory Visit: Payer: Self-pay

## 2019-08-25 ENCOUNTER — Encounter: Payer: Self-pay | Admitting: Emergency Medicine

## 2019-08-25 DIAGNOSIS — I255 Ischemic cardiomyopathy: Secondary | ICD-10-CM

## 2019-08-25 DIAGNOSIS — I739 Peripheral vascular disease, unspecified: Secondary | ICD-10-CM | POA: Diagnosis present

## 2019-08-25 DIAGNOSIS — Z981 Arthrodesis status: Secondary | ICD-10-CM

## 2019-08-25 DIAGNOSIS — Z20828 Contact with and (suspected) exposure to other viral communicable diseases: Secondary | ICD-10-CM | POA: Diagnosis present

## 2019-08-25 DIAGNOSIS — I444 Left anterior fascicular block: Secondary | ICD-10-CM | POA: Diagnosis present

## 2019-08-25 DIAGNOSIS — I248 Other forms of acute ischemic heart disease: Secondary | ICD-10-CM | POA: Diagnosis present

## 2019-08-25 DIAGNOSIS — G40509 Epileptic seizures related to external causes, not intractable, without status epilepticus: Principal | ICD-10-CM | POA: Diagnosis present

## 2019-08-25 DIAGNOSIS — Z955 Presence of coronary angioplasty implant and graft: Secondary | ICD-10-CM | POA: Diagnosis not present

## 2019-08-25 DIAGNOSIS — F10239 Alcohol dependence with withdrawal, unspecified: Secondary | ICD-10-CM | POA: Diagnosis present

## 2019-08-25 DIAGNOSIS — Z825 Family history of asthma and other chronic lower respiratory diseases: Secondary | ICD-10-CM | POA: Diagnosis not present

## 2019-08-25 DIAGNOSIS — F314 Bipolar disorder, current episode depressed, severe, without psychotic features: Secondary | ICD-10-CM | POA: Diagnosis present

## 2019-08-25 DIAGNOSIS — F419 Anxiety disorder, unspecified: Secondary | ICD-10-CM | POA: Diagnosis present

## 2019-08-25 DIAGNOSIS — I25118 Atherosclerotic heart disease of native coronary artery with other forms of angina pectoris: Secondary | ICD-10-CM | POA: Diagnosis present

## 2019-08-25 DIAGNOSIS — Z95828 Presence of other vascular implants and grafts: Secondary | ICD-10-CM

## 2019-08-25 DIAGNOSIS — R079 Chest pain, unspecified: Secondary | ICD-10-CM

## 2019-08-25 DIAGNOSIS — F10229 Alcohol dependence with intoxication, unspecified: Secondary | ICD-10-CM | POA: Diagnosis present

## 2019-08-25 DIAGNOSIS — I10 Essential (primary) hypertension: Secondary | ICD-10-CM | POA: Diagnosis not present

## 2019-08-25 DIAGNOSIS — Z8521 Personal history of malignant neoplasm of larynx: Secondary | ICD-10-CM

## 2019-08-25 DIAGNOSIS — F10939 Alcohol use, unspecified with withdrawal, unspecified: Secondary | ICD-10-CM

## 2019-08-25 DIAGNOSIS — R569 Unspecified convulsions: Secondary | ICD-10-CM

## 2019-08-25 DIAGNOSIS — K529 Noninfective gastroenteritis and colitis, unspecified: Secondary | ICD-10-CM | POA: Diagnosis present

## 2019-08-25 DIAGNOSIS — Z87820 Personal history of traumatic brain injury: Secondary | ICD-10-CM | POA: Diagnosis not present

## 2019-08-25 DIAGNOSIS — F17210 Nicotine dependence, cigarettes, uncomplicated: Secondary | ICD-10-CM | POA: Diagnosis present

## 2019-08-25 DIAGNOSIS — R45851 Suicidal ideations: Secondary | ICD-10-CM | POA: Diagnosis present

## 2019-08-25 DIAGNOSIS — Z923 Personal history of irradiation: Secondary | ICD-10-CM

## 2019-08-25 DIAGNOSIS — I25119 Atherosclerotic heart disease of native coronary artery with unspecified angina pectoris: Secondary | ICD-10-CM

## 2019-08-25 DIAGNOSIS — E785 Hyperlipidemia, unspecified: Secondary | ICD-10-CM | POA: Diagnosis present

## 2019-08-25 DIAGNOSIS — K219 Gastro-esophageal reflux disease without esophagitis: Secondary | ICD-10-CM | POA: Diagnosis present

## 2019-08-25 DIAGNOSIS — R0789 Other chest pain: Secondary | ICD-10-CM | POA: Diagnosis present

## 2019-08-25 DIAGNOSIS — I5022 Chronic systolic (congestive) heart failure: Secondary | ICD-10-CM | POA: Diagnosis present

## 2019-08-25 DIAGNOSIS — I110 Hypertensive heart disease with heart failure: Secondary | ICD-10-CM | POA: Diagnosis present

## 2019-08-25 DIAGNOSIS — I252 Old myocardial infarction: Secondary | ICD-10-CM | POA: Diagnosis not present

## 2019-08-25 LAB — BASIC METABOLIC PANEL
Anion gap: 13 (ref 5–15)
BUN: 9 mg/dL (ref 6–20)
CO2: 23 mmol/L (ref 22–32)
Calcium: 9.1 mg/dL (ref 8.9–10.3)
Chloride: 106 mmol/L (ref 98–111)
Creatinine, Ser: 0.66 mg/dL (ref 0.61–1.24)
GFR calc Af Amer: >60 mL/min (ref >60)
GFR calc non Af Amer: >60 mL/min (ref >60)
Glucose, Bld: 108 mg/dL — ABNORMAL HIGH (ref 70–99)
Potassium: 4.1 mmol/L (ref 3.5–5.1)
Sodium: 142 mmol/L (ref 135–145)

## 2019-08-25 LAB — LITHIUM LEVEL: Lithium Lvl: <0.06 mmol/L — ABNORMAL LOW (ref 0.60–1.20)

## 2019-08-25 LAB — CBC
HCT: 44.6 % (ref 39.0–52.0)
Hemoglobin: 15.3 g/dL (ref 13.0–17.0)
MCH: 33.3 pg (ref 26.0–34.0)
MCHC: 34.3 g/dL (ref 30.0–36.0)
MCV: 97.2 fL (ref 80.0–100.0)
Platelets: 148 10*3/uL — ABNORMAL LOW (ref 150–400)
RBC: 4.59 MIL/uL (ref 4.22–5.81)
RDW: 12.5 % (ref 11.5–15.5)
WBC: 5 10*3/uL (ref 4.0–10.5)
nRBC: 0 % (ref 0.0–0.2)

## 2019-08-25 LAB — HEPATIC FUNCTION PANEL
ALT: 29 U/L (ref 0–44)
AST: 34 U/L (ref 15–41)
Albumin: 4.8 g/dL (ref 3.5–5.0)
Alkaline Phosphatase: 43 U/L (ref 38–126)
Bilirubin, Direct: <0.1 mg/dL (ref 0.0–0.2)
Total Bilirubin: 0.7 mg/dL (ref 0.3–1.2)
Total Protein: 7.8 g/dL (ref 6.5–8.1)

## 2019-08-25 LAB — VALPROIC ACID LEVEL: Valproic Acid Lvl: 14 ug/mL — ABNORMAL LOW (ref 50.0–100.0)

## 2019-08-25 LAB — TROPONIN I (HIGH SENSITIVITY)
Troponin I (High Sensitivity): 19 ng/L — ABNORMAL HIGH (ref <18)
Troponin I (High Sensitivity): 18 ng/L — ABNORMAL HIGH (ref <18)

## 2019-08-25 LAB — URINE DRUG SCREEN, QUALITATIVE (ARMC ONLY)
Amphetamines, Ur Screen: NOT DETECTED
Barbiturates, Ur Screen: NOT DETECTED
Benzodiazepine, Ur Scrn: NOT DETECTED
Cannabinoid 50 Ng, Ur ~~LOC~~: NOT DETECTED
Cocaine Metabolite,Ur ~~LOC~~: NOT DETECTED
MDMA (Ecstasy)Ur Screen: NOT DETECTED
Methadone Scn, Ur: NOT DETECTED
Opiate, Ur Screen: NOT DETECTED
Phencyclidine (PCP) Ur S: NOT DETECTED
Tricyclic, Ur Screen: NOT DETECTED

## 2019-08-25 LAB — MAGNESIUM: Magnesium: 2.1 mg/dL (ref 1.7–2.4)

## 2019-08-25 LAB — TSH: TSH: 1.758 u[IU]/mL (ref 0.350–4.500)

## 2019-08-25 LAB — PHOSPHORUS: Phosphorus: 2.2 mg/dL — ABNORMAL LOW (ref 2.5–4.6)

## 2019-08-25 LAB — SARS CORONAVIRUS 2 BY RT PCR (HOSPITAL ORDER, PERFORMED IN ~~LOC~~ HOSPITAL LAB): SARS Coronavirus 2: NEGATIVE

## 2019-08-25 LAB — ETHANOL: Alcohol, Ethyl (B): 268 mg/dL — ABNORMAL HIGH (ref <10)

## 2019-08-25 MED ORDER — LORAZEPAM 2 MG/ML IJ SOLN
1.0000 mg | INTRAMUSCULAR | Status: DC | PRN
  Administered 2019-08-26: 1 mg via INTRAVENOUS
  Filled 2019-08-25: qty 1

## 2019-08-25 MED ORDER — CARIPRAZINE HCL 3 MG PO CAPS
4.5000 mg | ORAL_CAPSULE | ORAL | Status: DC
  Administered 2019-08-25: 4.5 mg via ORAL
  Filled 2019-08-25: qty 1

## 2019-08-25 MED ORDER — LORAZEPAM 2 MG/ML IJ SOLN
INTRAMUSCULAR | Status: AC
  Administered 2019-08-25: 04:00:00 2 mg via INTRAVENOUS
  Filled 2019-08-25: qty 1

## 2019-08-25 MED ORDER — SODIUM CHLORIDE 0.9% FLUSH
3.0000 mL | Freq: Once | INTRAVENOUS | Status: AC
  Administered 2019-08-25: 07:00:00 3 mL via INTRAVENOUS

## 2019-08-25 MED ORDER — ONDANSETRON HCL 4 MG PO TABS: 4.0000 mg | ORAL_TABLET | Freq: Four times a day (QID) | ORAL | Status: DC | PRN

## 2019-08-25 MED ORDER — LEVETIRACETAM IN NACL 1000 MG/100ML IV SOLN
1000.0000 mg | INTRAVENOUS | Status: AC
  Administered 2019-08-25: 1000 mg via INTRAVENOUS
  Filled 2019-08-25: qty 100

## 2019-08-25 MED ORDER — THIAMINE HCL 100 MG/ML IJ SOLN: 100.0000 mg | Freq: Every day | INTRAMUSCULAR | Status: DC

## 2019-08-25 MED ORDER — SODIUM CHLORIDE 0.9 % IV SOLN
INTRAVENOUS | Status: DC
  Administered 2019-08-25 – 2019-08-26 (×3): via INTRAVENOUS

## 2019-08-25 MED ORDER — TICAGRELOR 60 MG PO TABS
60.0000 mg | ORAL_TABLET | Freq: Two times a day (BID) | ORAL | Status: DC
  Administered 2019-08-25 – 2019-08-26 (×2): 60 mg via ORAL
  Filled 2019-08-25 (×3): qty 1

## 2019-08-25 MED ORDER — LORAZEPAM 1 MG PO TABS
1.0000 mg | ORAL_TABLET | ORAL | Status: DC | PRN
  Administered 2019-08-26: 2 mg via ORAL
  Administered 2019-08-26: 1 mg via ORAL
  Filled 2019-08-25: qty 1
  Filled 2019-08-25: qty 2

## 2019-08-25 MED ORDER — FOLIC ACID 1 MG PO TABS
1.0000 mg | ORAL_TABLET | Freq: Every day | ORAL | Status: DC
  Administered 2019-08-25 – 2019-08-26 (×2): 1 mg via ORAL
  Filled 2019-08-25 (×2): qty 1

## 2019-08-25 MED ORDER — ONDANSETRON HCL 4 MG/2ML IJ SOLN
4.0000 mg | Freq: Four times a day (QID) | INTRAMUSCULAR | Status: DC | PRN
  Administered 2019-08-25: 4 mg via INTRAVENOUS
  Filled 2019-08-25: qty 2

## 2019-08-25 MED ORDER — CARVEDILOL 3.125 MG PO TABS
3.1250 mg | ORAL_TABLET | Freq: Two times a day (BID) | ORAL | Status: DC
  Administered 2019-08-25 – 2019-08-26 (×2): 3.125 mg via ORAL
  Filled 2019-08-25 (×2): qty 1

## 2019-08-25 MED ORDER — LORAZEPAM 2 MG/ML IJ SOLN
0.0000 mg | INTRAMUSCULAR | Status: DC
  Administered 2019-08-25: 1 mg via INTRAVENOUS
  Administered 2019-08-25: 3 mg via INTRAVENOUS
  Filled 2019-08-25: qty 2
  Filled 2019-08-25: qty 1

## 2019-08-25 MED ORDER — ASPIRIN EC 81 MG PO TBEC
81.0000 mg | DELAYED_RELEASE_TABLET | Freq: Every day | ORAL | Status: DC
  Administered 2019-08-26: 81 mg via ORAL
  Filled 2019-08-25: qty 1

## 2019-08-25 MED ORDER — IRBESARTAN 150 MG PO TABS
150.0000 mg | ORAL_TABLET | Freq: Every day | ORAL | Status: DC
  Administered 2019-08-25 – 2019-08-26 (×2): 150 mg via ORAL
  Filled 2019-08-25 (×2): qty 1

## 2019-08-25 MED ORDER — PANTOPRAZOLE SODIUM 40 MG PO TBEC
40.0000 mg | DELAYED_RELEASE_TABLET | Freq: Every day | ORAL | Status: DC
  Administered 2019-08-25 – 2019-08-26 (×2): 40 mg via ORAL
  Filled 2019-08-25 (×2): qty 1

## 2019-08-25 MED ORDER — CARIPRAZINE HCL 3 MG PO CAPS
22.5000 mg | ORAL_CAPSULE | ORAL | Status: DC
  Filled 2019-08-25: qty 1

## 2019-08-25 MED ORDER — LORAZEPAM 2 MG/ML IJ SOLN: 0.0000 mg | Freq: Three times a day (TID) | INTRAMUSCULAR | Status: DC

## 2019-08-25 MED ORDER — ATORVASTATIN CALCIUM 20 MG PO TABS
40.0000 mg | ORAL_TABLET | Freq: Every day | ORAL | Status: DC
  Administered 2019-08-25: 40 mg via ORAL
  Filled 2019-08-25: qty 2

## 2019-08-25 MED ORDER — CARIPRAZINE HCL 3 MG PO CAPS: 4.5000 mg | ORAL_CAPSULE | Freq: Every day | ORAL | Status: DC

## 2019-08-25 MED ORDER — ACETAMINOPHEN 325 MG PO TABS: 650.0000 mg | ORAL_TABLET | Freq: Four times a day (QID) | ORAL | Status: DC | PRN

## 2019-08-25 MED ORDER — NITROGLYCERIN 0.4 MG SL SUBL: 0.4000 mg | SUBLINGUAL_TABLET | SUBLINGUAL | Status: DC | PRN

## 2019-08-25 MED ORDER — DIVALPROEX SODIUM ER 500 MG PO TB24
1000.0000 mg | ORAL_TABLET | Freq: Every day | ORAL | Status: DC
  Administered 2019-08-25: 1000 mg via ORAL
  Filled 2019-08-25 (×2): qty 2

## 2019-08-25 MED ORDER — ADULT MULTIVITAMIN W/MINERALS CH
1.0000 | ORAL_TABLET | Freq: Every day | ORAL | Status: DC
  Administered 2019-08-25 – 2019-08-26 (×2): 1 via ORAL
  Filled 2019-08-25 (×2): qty 1

## 2019-08-25 MED ORDER — VITAMIN B-1 100 MG PO TABS
100.0000 mg | ORAL_TABLET | Freq: Every day | ORAL | Status: DC
  Administered 2019-08-25 – 2019-08-26 (×2): 100 mg via ORAL
  Filled 2019-08-25 (×2): qty 1

## 2019-08-25 MED ORDER — ENOXAPARIN SODIUM 40 MG/0.4ML ~~LOC~~ SOLN
40.0000 mg | SUBCUTANEOUS | Status: DC
  Administered 2019-08-25: 40 mg via SUBCUTANEOUS
  Filled 2019-08-25: qty 0.4

## 2019-08-25 MED ORDER — LORAZEPAM 2 MG/ML IJ SOLN
2.0000 mg | Freq: Once | INTRAMUSCULAR | Status: AC
  Administered 2019-08-25: 04:00:00 2 mg via INTRAVENOUS

## 2019-08-25 MED ORDER — LITHIUM CARBONATE ER 450 MG PO TBCR
450.0000 mg | EXTENDED_RELEASE_TABLET | Freq: Two times a day (BID) | ORAL | Status: DC
  Administered 2019-08-25 – 2019-08-26 (×3): 450 mg via ORAL
  Filled 2019-08-25 (×4): qty 1

## 2019-08-25 MED ORDER — ACETAMINOPHEN 650 MG RE SUPP: 650.0000 mg | Freq: Four times a day (QID) | RECTAL | Status: DC | PRN

## 2019-08-25 MED ORDER — CARIPRAZINE HCL 4.5 MG PO CAPS: 4.5000 | ORAL_CAPSULE | Freq: Every day | ORAL | Status: DC

## 2019-08-25 MED ORDER — CHLORDIAZEPOXIDE HCL 25 MG PO CAPS
50.0000 mg | ORAL_CAPSULE | Freq: Three times a day (TID) | ORAL | Status: DC
  Administered 2019-08-25: 50 mg via ORAL
  Filled 2019-08-25: qty 2

## 2019-08-25 MED ORDER — DOCUSATE SODIUM 100 MG PO CAPS
100.0000 mg | ORAL_CAPSULE | Freq: Two times a day (BID) | ORAL | Status: DC
  Administered 2019-08-26: 100 mg via ORAL
  Filled 2019-08-25 (×2): qty 1

## 2019-08-25 NOTE — Consult Note (Addendum)
Cardiology Consultation:   Patient ID: [name] MRN: [id number]; DOB: 06-02-1961  Admit date: 08/25/2019 Date of Consult: 08/25/2019  Primary Care Provider: Gayland Curry, MD Primary Cardiologist: Kathlyn Sacramento, MD  Primary Electrophysiologist:  None    Patient Profile:   [name] is a 58 y.o. male with a hx of CAD s/p PCI, HFrEF secondary to ICM, PAD s/p prior bilateral iliac stenting, more recent arterial bifemoral bypass 11/2014 by Dr. Kellie Simmering followed by PVS, mild to moderate left-sided renal artery stenosis, hepatitis C, ongoing tobacco and alcohol abuse, bipolar disorder, laryngeal cancer, hypertension, and hyperlipidemia who is being seen today for the evaluation of elevated troponin at the request of Dr. Marcille Blanco.  History of Present Illness:   [name] is a 58 year old male with PMH as above.  Prior 2008 cath with successful PCI to RCA.  Repeat 05/2012 cath showed occluded LCx that was previously stented with details unclear, patent RCA stent with mild in-stent restenosis, nonobstructive disease involving the LAD and EF of 35%.  06/2013 cath showed stable coronary anatomy.  Admitted 03/2018 with inferior STEMI and was found to have an occluded RCA stent that was successfully treated with PCI/DES.  His previously documented occluded LCx was again noted.  EF 40 to 45%.  Seen in the office 06/2018 and continued to smoke and drink 12 beers daily.  Of note, he has attended multiple rehab sessions without success.  He also noted chronic substernal chest pain, described as an aching sensation and felt to be atypical.  Seen in the ED 10/2018 with left-sided chest pain.  He had been "drinking all day and took 3 Xanax bars." He left before workup could be completed.    Seen in the office 06/2019.  Thought to be due to doing reasonably well at that time.  He continued to note chest pain and exertional dyspnea, dating back to 03/2018.  Had reportedly quit alcohol in the summer  2019, though he was seen in the ED for alcohol intoxication 10/2017.  He continued to smoke 1/2 pack daily and was not interested in quitting.  He reported medication compliance.  Presented to the ED 08/16/2019 with atypical chest pain after alcohol intoxication.  Initial troponin XIV with repeat troponin XV.  He was given a GI cocktail with improvement in his chest pain.  It was suspected the patient was suffering from gastritis, which was discussed with the patient at length.  He was discharged from the ED with recommendation to stop further alcohol use.  He presented to the ED 08/25/2019, reportedly very depressed since the death of his mother approximately 4 months earlier.  He reportedly has been smoking 2 packs daily. He admitted to drinking on fifth of liquor a day, in addition to up to 20 beers daily.  His last drink was reportedly an hour before arrival.  In the emergency department, he reported having a seizure earlier that day, as well as the day before. This sz was witnessed by his roommate. He is unsure if he hit his head. He also also reported intermittent chest pain with last episode starting yetsterday. He stated this CP is currently 3/10 but was 8/10 when it first started. It was located on the left side of his chest and went down his left arm. Associated sx included SOB and dizziness. He also reported hemoptysis but stated it had been several days since his last episode and that he did not cough up much blood. He reported bright red blood in  the toilet as well, which reportedly has been ongoing for some time now. No melena. He stated that he has chronic diarrhea with most bowel movements diarrhea and last episode yesterday.  He stated that his chest pain was not like before his caths.  Initial troponin 19  18.  EKG without acute ST or T changes. Echo pending.  EKG shows normal sinus rhythm, 82 bpm, left anterior fascicular block, IVCD with repolarization abnormalities, baseline artifact                                                                                                                                                                                                                                            Heart Pathway Score:     Past Medical History:  Diagnosis Date   Anxiety    Bipolar affective ([place])    takes Lithium daily   Broken fingers    Coronary artery disease    a. 1997 PCI: RCA ([facility]); b. 2008 PCI: RCA; c. 05/2012 Cath: LCx 100 (previously stented - ? date), RCA stent w/ mild ISR, LAD nonobs, EF 35%; d. 06/2013 Cath: stable anatomy; e. 03/2018 Inf STEMI/PCI: LCX 100o/p ISR (CTO), RCA 158m/d (3.0x38 Resolute Onyx DES), EF 40-45%.    Depression    GERD (gastroesophageal reflux disease)    takes Protonix daily   Hepatitis C    "diagnosed in the past 2 wk" (10/22/2012)   History of ETOH abuse    quit 2007   History of radiation therapy 12/17/17- 01/24/18   Larynx treated to 63 gy in 28 fx of 2.25 Gy   Hyperlipidemia    Hypertension    "pt. denies - no longer taking blood pressure medications"   Ischemic cardiomyopathy    a. 03/2018 LV gram: EF 40-45% @ time of MI.   Myocardial infarction ([place])    x 4   PAD (peripheral artery disease) ([place])    a. 2010 PTA & stent to left CIA & left SFA; b. 05/2012 PTA & stent to distal LCIA; c. 10/2012 thrombectomy and stents to [place]; d. 11/2014 s/p Ao-Bifem bypass.   Schizophrenia ([place])    Seizures ([place])    "when I get anxious" (Stated last seizure three months ago)   Tobacco abuse    Urinary frequency     Past Surgical History:  Procedure Laterality Date   ABDOMINAL  AORTAGRAM N/A 10/22/2012   Procedure: ABDOMINAL Maxcine Ham;  Surgeon: Wellington Hampshire, MD;  Location: [facility];  Service: Cardiovascular;  Laterality: N/A;   ABDOMINAL AORTAGRAM N/A 11/11/2014   Procedure: ABDOMINAL Maxcine Ham;  Surgeon: Wellington Hampshire, MD;  Location: [facility];  Service: Cardiovascular;  Laterality: N/A;    ANTERIOR CERVICAL DECOMP/DISCECTOMY FUSION  01/2011   plate & screw placement    ANTERIOR CERVICAL DECOMP/DISCECTOMY FUSION N/A 01/26/2017   Procedure: ANTERIOR CERVICAL DECOMPRESSION/DISCECTOMY FUSION CERVICAL FOUR- CERVICAL FIVE;  Surgeon: Ashok Pall, MD;  Location: [facility];  Service: Neurosurgery;  Laterality: N/A;  ANTERIOR CERVICAL DECOMPRESSION/DISCECTOMY FUSION CERVICAL FOUR- CERVICAL FIVE   AORTA - BILATERAL FEMORAL ARTERY BYPASS GRAFT N/A 11/20/2014   Procedure: AORTA BIFEMORAL BYPASS GRAFT;  Surgeon: Mal Misty, MD;  Location: [facility];  Service: Vascular;  Laterality: N/A;   BACK SURGERY     BRAIN SURGERY  1989   subdural hematoma   CARDIAC CATHETERIZATION     Stent - 12   CHOLECYSTECTOMY N/A 06/29/2017   Procedure: LAPAROSCOPIC CHOLECYSTECTOMY WITH INTRAOPERATIVE CHOLANGIOGRAM, POSSIBLE OPEN;  Surgeon: Robert Bellow, MD;  Location: [facility];  Service: General;  Laterality: N/A;   CORONARY ANGIOPLASTY WITH STENT PLACEMENT  03/2007   to RCA   CORONARY/GRAFT ACUTE MI REVASCULARIZATION N/A 03/07/2018   Procedure: Coronary/Graft Acute MI Revascularization;  Surgeon: Isaias Cowman, MD;  Location: [place] [facility];  Service: Cardiovascular;  Laterality: N/A;   HERNIA REPAIR     ILIAC ARTERY STENT  10/22/2012   "2" (10/22/2012)   ILIAC ARTERY STENT  06/2012   left   INCISIONAL HERNIA REPAIR N/A 11/08/2015   Procedure: INCISIONAL HERNIA REPAIR WITH MYOFASCIAL RELEASE;  Surgeon: Rolm Bookbinder, MD;  Location: [facility];  Service: General;  Laterality: N/A;   INSERTION OF MESH N/A 11/08/2015   Procedure: INSERTION OF MESH;  Surgeon: Rolm Bookbinder, MD;  Location: [facility];  Service: General;  Laterality: N/A;   LEFT HEART CATH AND CORONARY ANGIOGRAPHY N/A 03/07/2018   Procedure: LEFT HEART CATH AND CORONARY ANGIOGRAPHY;  Surgeon: Isaias Cowman, MD;  Location: [facility];  Service: Cardiovascular;  Laterality: N/A;   LEFT HEART CATHETERIZATION WITH  CORONARY ANGIOGRAM N/A 05/29/2012   Procedure: LEFT HEART CATHETERIZATION WITH CORONARY ANGIOGRAM;  Surgeon: Wellington Hampshire, MD;  Location: [facility];  Service: Cardiovascular;  Laterality: N/A;   LIVER BIOPSY  10/2012   LOWER EXTREMITY ANGIOGRAM N/A 05/29/2012   Procedure: LOWER EXTREMITY ANGIOGRAM;  Surgeon: Wellington Hampshire, MD;  Location: [facility];  Service: Cardiovascular;  Laterality: N/A;   MICROLARYNGOSCOPY WITH CO2 LASER AND EXCISION OF VOCAL CORD LESION N/A 11/21/2017   Procedure: MICROLARYNGOSCOPY WITH EXCISION OF VOCAL CORD LESION;  Surgeon: Jerrell Belfast, MD;  Location: [facility];  Service: ENT;  Laterality: N/A;   PERCUTANEOUS STENT INTERVENTION Left 05/29/2012   Procedure: PERCUTANEOUS STENT INTERVENTION;  Surgeon: Wellington Hampshire, MD;  Location: [facility];  Service: Cardiovascular;  Laterality: Left;   POSTERIOR FUSION CERVICAL SPINE  2012   SUBDURAL HEMATOMA EVACUATION VIA CRANIOTOMY       Home Medications:  Prior to Admission medications   Medication Sig Start Date End Date Taking? Authorizing Provider  carvedilol (COREG) 3.125 MG tablet Take 3.125 mg by mouth 2 (two) times daily with a meal.    Yes [provider]  divalproex (DEPAKOTE ER) 500 MG 24 hr tablet Take 1,000 mg by mouth at bedtime.  06/17/17  Yes [provider]  pantoprazole (PROTONIX) 40 MG tablet Take 40 mg by mouth daily. In the afternoon. 06/10/17  Yes [provider]  QUEtiapine (SEROQUEL) 50 MG tablet Take 25 mg by mouth every 4 (four) hours as needed for agitation. 07/09/19  Yes [provider]  telmisartan (MICARDIS) 40 MG tablet Take 1 tablet (40 mg total) by mouth daily. 06/04/19  Yes Dunn, Areta Haber, PA-C  ticagrelor (BRILINTA) 60 MG TABS tablet Take 1 tablet (60 mg total) by mouth 2 (two) times daily. 06/04/19  Yes Dunn, Ryan M, PA-C  VRAYLAR 4.5 MG CAPS Take 4.5 capsules by mouth at bedtime. 10/01/18  Yes [provider]  aspirin 81 MG chewable tablet  Chew 1 tablet (81 mg total) by mouth daily. 03/10/18   Saundra Shelling, MD  atorvastatin (LIPITOR) 40 MG tablet Take 1 tablet (40 mg total) by mouth daily at 6 PM. 03/09/18   Pyreddy, Reatha Harps, MD  lithium carbonate (ESKALITH) 450 MG CR tablet Take 450 mg by mouth 2 (two) times daily.    [provider]    Inpatient Medications: Scheduled Meds:  carvedilol  3.125 mg Oral BID WC   divalproex  1,000 mg Oral QHS   docusate sodium  100 mg Oral BID   enoxaparin (LOVENOX) injection  40 mg Subcutaneous [id number]   folic acid  1 mg Oral Daily   irbesartan  150 mg Oral Daily   LORazepam  0-4 mg Intravenous Q4H   Followed by   Derrill Memo ON 08/27/2019] LORazepam  0-4 mg Intravenous Q8H   multivitamin with minerals  1 tablet Oral Daily   pantoprazole  40 mg Oral Q1200   thiamine  100 mg Oral Daily   Or   thiamine  100 mg Intravenous Daily   ticagrelor  60 mg Oral BID   Continuous Infusions:  sodium chloride     PRN Meds: acetaminophen **OR** acetaminophen, LORazepam **OR** LORazepam, nitroGLYCERIN, ondansetron **OR** ondansetron (ZOFRAN) IV  Allergies:    Allergies  Allergen Reactions   Lisinopril Other (See Comments)    Other reaction(s): Elevated creatine and very elevated potassium     Social History:   Social History   Socioeconomic History   Marital status: Divorced    Spouse name: Not on file   Number of children: Not on file   Years of education: Not on file   Highest education level: Not on file  Occupational History   Occupation: Disabled    Employer: UNEMPLOYED  Social Designer, fashion/clothing strain: Not on file   Food insecurity    Worry: Not on file    Inability: Not on file   Transportation needs    Medical: No    Non-medical: No  Tobacco Use   Smoking status: Current Every Day Smoker    Packs/day: 2.00    Years: 40.00    Pack years: 80.00    Types: Cigarettes   Smokeless tobacco: Never Used   Tobacco comment: "ive slowed down  recently"   Substance and Sexual Activity   Alcohol use: Yes    Alcohol/week: 0.0 standard drinks    Comment: 1/2 gallon or 20 beers daily   Drug use: Yes    Types: Marijuana    Comment: last smoked 1 month ago   Sexual activity: Yes  Lifestyle   Physical activity    Days per week: Not on file    Minutes per session: Not on file   Stress: Not on file  Relationships   Social connections  Talks on phone: Not on file    Gets together: Not on file    Attends religious service: Not on file    Active member of club or organization: Not on file    Attends meetings of clubs or organizations: Not on file    Relationship status: Not on file   Intimate partner violence    Fear of current or ex partner: No    Emotionally abused: No    Physically abused: No    Forced sexual activity: No  Other Topics Concern   Not on file  Social History Narrative   17 years Bipolar/ADHD.   Pt gets regular exercise.    Family History:    Family History  Problem Relation Age of Onset   Emphysema Father    COPD Mother    Diabetes Other    Alcohol abuse Other    Hypertension Other    Hyperlipidemia Other    Seizures Other      ROS:  Please see the history of present illness.  Review of Systems  Constitutional: Positive for malaise/fatigue and weight loss.  Respiratory: Positive for hemoptysis and shortness of breath.        Reports coughing up small amounts of blood with last episode occurring several days earlier  Cardiovascular: Positive for chest pain. Negative for palpitations, orthopnea and leg swelling.       Left sided and down L arm. Current CP, starting yesterday and improved from 8/10 to 3/10.  Gastrointestinal: Positive for blood in stool, diarrhea and heartburn. Negative for constipation, melena, nausea and vomiting.       Reports chronic diarrhea with bright red blood reportedly recent in the toilet but not in large amounts  Neurological: Positive for dizziness,  seizures and weakness.       Sx x2 with most recent episodes yesterday 9/20 and previous day 9/19  Psychiatric/Behavioral: Positive for depression.  All other systems reviewed and are negative.   All other ROS reviewed and negative.     Physical Exam/Data:   Vitals:   08/25/19 0715 08/25/19 0814 08/25/19 0947 08/25/19 1047  BP: 133/88 115/74 126/85 139/81  Pulse: 72 71 81 76  Resp: 18 20 20 20   Temp:    98.4 F (36.9 C)  TempSrc:    Oral  SpO2: 98% 94% 97% 99%  Weight:    60.7 kg  Height:    5\' 7"  (1.702 m)    Intake/Output Summary (Last 24 hours) at 08/25/2019 1122 Last data filed at 08/25/2019 0550 Gross per 24 hour  Intake 91.76 ml  Output --  Net 91.76 ml   Last 3 Weights 08/25/2019 08/25/2019 08/15/2019  Weight (lbs) 133 lb 12.8 oz 119 lb 14.9 oz 120 lb  Weight (kg) 60.691 kg 54.4 kg 54.432 kg     Body mass index is 20.96 kg/m.  General:  Frail/thin male, appears older than his stated age 1: normal Neck: no JVD Vascular: No carotid bruits; radial pulses 2+ bilaterally Cardiac:  normal S1, S2; soft heart sounds, extrasystole appreciated; no murmur  Lungs:  clear to auscultation bilaterally with coarser breath sounds and trace crackles at the bases, no wheezing Abd: soft, nontender, no hepatomegaly  Ext: no b/l lower extremity edema Musculoskeletal:  No deformities Skin: warm and dry  Neuro:  No focal abnormalities noted, slight tremor noted Psych:  Normal affect   EKG:  The EKG was personally reviewed and demonstrates:  EKG shows normal sinus rhythm, 82 bpm, left anterior fascicular  block, IVCD with repolarization abnormalities, previous inferior infarct, baseline artifact  Telemetry:  Telemetry was personally reviewed and demonstrates:  SR with frequent PVCs / Trigeminy and ventricular couplets  Relevant CV Studies: Pending updated echo  Laboratory Data:  High Sensitivity Troponin:   Recent Labs  Lab 08/15/19 2216 08/16/19 0158 08/25/19 0400  08/25/19 0643  TROPONINIHS 14 15 19* 18*     Cardiac EnzymesNo results for input(s): TROPONINI in the last 168 hours. No results for input(s): TROPIPOC in the last 168 hours.  Chemistry Recent Labs  Lab 08/25/19 0400  NA 142  K 4.1  CL 106  CO2 23  GLUCOSE 108*  BUN 9  CREATININE 0.66  CALCIUM 9.1  GFRNONAA >60  GFRAA >60  ANIONGAP 13    Recent Labs  Lab 08/25/19 0400  PROT 7.8  ALBUMIN 4.8  AST 34  ALT 29  ALKPHOS 43  BILITOT 0.7   Hematology Recent Labs  Lab 08/25/19 0400  WBC 5.0  RBC 4.59  HGB 15.3  HCT 44.6  MCV 97.2  MCH 33.3  MCHC 34.3  RDW 12.5  PLT 148*   BNPNo results for input(s): BNP, PROBNP in the last 168 hours.  DDimer No results for input(s): DDIMER in the last 168 hours.   Radiology/Studies:  Dg Chest 1 View  Result Date: 08/25/2019 CLINICAL DATA:  Seizure. EXAM: CHEST  1 VIEW COMPARISON:  Ten days ago FINDINGS: Normal heart size. Left and right coronary circulation stents. Normal aortic and hilar contours. Right apical bleb. There is no edema, consolidation, effusion, or pneumothorax. Remote bilateral rib fractures. IMPRESSION: No evidence of acute disease. Electronically Signed   By: Monte Fantasia M.D.   On: 08/25/2019 04:23   Ct Head Wo Contrast  Result Date: 08/25/2019 CLINICAL DATA:  Initial evaluation for acute head trauma. EXAM: CT HEAD WITHOUT CONTRAST TECHNIQUE: Contiguous axial images were obtained from the base of the skull through the vertex without intravenous contrast. COMPARISON:  Prior MRI from 07/17/2007. FINDINGS: Brain: Examination mildly degraded by motion artifact. Mild age-related cerebral atrophy with chronic small vessel ischemic disease. No acute intracranial hemorrhage. No acute large vessel territory infarct. No mass lesion, midline shift or mass effect. No hydrocephalus. No extra-axial fluid collection. Vascular: No hyperdense vessel. Scattered vascular calcifications noted within the carotid siphons. Skull: Scalp  soft tissues demonstrate no acute finding. Prior right craniotomy. No calvarial fracture. Sinuses/Orbits: Globes and orbital soft tissues within normal limits. Paranasal sinuses mastoid air cells are clear. Other: None. IMPRESSION: 1. No acute intracranial abnormality. 2. Mild age-related cerebral atrophy with chronic small vessel ischemic disease. 3. Prior right craniotomy. Electronically Signed   By: Jeannine Boga M.D.   On: 08/25/2019 04:58    Assessment and Plan:   Chest pain CAD with stable angina --Current and rated 3/410 (improved from 8/10) with associated SOB and fatigue. Does have significant history of CAD as above, as well as an ongoing history of chronic chest pain that dates back to PCI 03/2018. Symptoms occur at rest and with exertion. Previously has declined stress testing for further workup (July 2020). --HS Tn peaked at 19, minimally elevated and flat trending, not consistent with ACS and more consistent with supply demand ischemia. EKG without acute ST/T changes.  EKG without acute ST/T changes.  --Echo pending. Despite negative HS Tn, patient has had ongoing CP with significant history of CAD and further workup recommended in the past. Further recommendations pending echo.  --Continue medical management with DAPT, ARB, BB, statin,  and PRN SL nitro.  --As below, patient reporting a history of small amounts of bright red blood in the toilet, as well as coughing up blood with CBC showing normal Hgb; however, he does not report that this has occurred in the last 24-48h. Consider GERD/GI symptoms as contributing to his symptoms and related to his ongoing alcohol abuse given his chronic diarrhea. Consider further GI workup given patient is on DAPT and to rule out worsening GI bleed. Continue PPI.   GERD --As above, recommend further workup as on DAPT. Continue PPI.  HFrEF secondary to ICM --Trace bibasilar crackles. Agree with reassessment of echocardiogram for EF.  Previously  known EF 40 to 45%. Further recommendations pending echo.   HTN --Somewhat controlled with SBP 139. Continue medical management as above.   HLD --Most recent LDL wnl. Continue statin.   Alcohol/tobacco abuse --Currently under alcohol withdrawal protocol.  --Reporting hemoptysis and bright red blood in the toilet that has started recently but not occurred in the last 24 hours. Hgb normal on CBC. Consider further workup per IM. --2 packs of cigarettes daily. Cessation advised. --1/5 liquor and 20 beers daily. Cessation advised.   Anxiety/Depression --Psychiatry consult pending.   PAD --Continue office follow-up  For questions or updates, please contact [facility] Please consult [email or web address] for contact info under     Signed, Arvil Chaco, PA-C  08/25/2019 11:22 AM

## 2019-08-25 NOTE — Progress Notes (Signed)
Patient admitting under suicide precautions.  Sitter at beds side. Cabinets secured.  Cord secured or removed from room.  Padding on side rails for seizure precautions

## 2019-08-25 NOTE — ED Provider Notes (Signed)
[facility] Emergency Department Provider Note  ____________________________________________  Time seen: Approximately 4:46 AM  I have reviewed the triage vital signs and the nursing notes.   HISTORY  Chief Complaint Suicidal, Chest Pain, Seizures, and Alcohol Intoxication   HPI [name] is a 58 y.o. male with a history of alcohol abuse, CAD, hepatitis C, hypertension, hyperlipidemia, schizophrenia, seizure disorder who presents for evaluation of chest pain and suicidal ideation.   Patient called 911 today for suicidal thoughts with a plan of hanging himself.  Recently lost his mother.  Patient has a history of heavy alcohol drink.  He reports drinking a gallon of liquor a day +20 beers.  Last alcohol intake was 1 hour ago.  Patient reports that he has been drinking this much for over 40 years.  He endorses marijuana but no other drugs.  Patient had a seizure 2 days ago.  After waking up from the seizure started having sharp left-sided chest and shoulder pain which is worse with palpation of his chest with movement of his arm.  He denies neck pain or headache.  He does report a prior subdural hematoma.  He is not on blood thinners. He endorses compliance with his antiepileptic medication.  He takes Depakote.  Patient had a seizure in the waiting room.  Patient is very shaky with a tremor from alcohol withdrawal.  Past Medical History:  Diagnosis Date  . Anxiety   . Bipolar affective ([place])    takes Lithium daily  . Broken fingers   . Coronary artery disease    a. 1997 PCI: RCA ([facility]); b. 2008 PCI: RCA; c. 05/2012 Cath: LCx 100 (previously stented - ? date), RCA stent w/ mild ISR, LAD nonobs, EF 35%; d. 06/2013 Cath: stable anatomy; e. 03/2018 Inf STEMI/PCI: LCX 100o/p ISR (CTO), RCA 1108m/d (3.0x38 Resolute Onyx DES), EF 40-45%.   . Depression   . GERD (gastroesophageal reflux disease)    takes Protonix daily  . Hepatitis C    "diagnosed in the past 2 wk"  (10/22/2012)  . History of ETOH abuse    quit 2007  . History of radiation therapy 12/17/17- 01/24/18   Larynx treated to 63 gy in 28 fx of 2.25 Gy  . Hyperlipidemia   . Hypertension    "pt. denies - no longer taking blood pressure medications"  . Ischemic cardiomyopathy    a. 03/2018 LV gram: EF 40-45% @ time of MI.  Marland Kitchen Myocardial infarction ([place])    x 4  . PAD (peripheral artery disease) ([place])    a. 2010 PTA & stent to left CIA & left SFA; b. 05/2012 PTA & stent to distal LCIA; c. 10/2012 thrombectomy and stents to [place]; d. 11/2014 s/p Ao-Bifem bypass.  . Schizophrenia ([place])   . Seizures ([place])    "when I get anxious" (Stated last seizure three months ago)  . Tobacco abuse   . Urinary frequency     Patient Active Problem List   Diagnosis Date Noted  . Malnutrition of moderate degree 03/09/2018  . STEMI involving oth coronary artery of inferior wall ([place]) 03/07/2018  . 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 Cholecystitis 06/29/2017  . Osteoarthritis of spine with radiculopathy, cervical region 01/26/2017  . Gallbladder polyp 06/01/2016  . Rib pain on right side 06/01/2016  . ETOH abuse 11/24/2014  . Bipolar disorder, currently in remission ([place]) 11/24/2014  . Alcohol dependence with withdrawal with complication (  [place]) 11/24/2014  . Chronic hepatitis C ([place]) 09/12/2013  . Cardiomyopathy, ischemic 06/09/2013  . PAD (peripheral artery disease) ([place]) 05/23/2012  . HTN (hypertension) 10/09/2011  . CAROTID BRUIT 12/19/2010  . DYSPNEA 06/15/2010  . Hyperlipidemia 05/06/2010  . TOBACCO USER 05/06/2010  . CAD, NATIVE VESSEL 05/06/2010  . 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 ABDOMINAL AORTAGRAM N/A 10/22/2012   Procedure: ABDOMINAL Maxcine Ham;  Surgeon: Wellington Hampshire, MD;  Location: [facility]ATH [facility];  Service: Cardiovascular;  Laterality:  N/A;  . ABDOMINAL AORTAGRAM N/A 11/11/2014   Procedure: ABDOMINAL Maxcine Ham;  Surgeon: Wellington Hampshire, MD;  Location: [facility];  Service: Cardiovascular;  Laterality: N/A;  . ANTERIOR CERVICAL DECOMP/DISCECTOMY FUSION  01/2011   plate & screw placement   . ANTERIOR CERVICAL DECOMP/DISCECTOMY FUSION N/A 01/26/2017   Procedure: ANTERIOR CERVICAL DECOMPRESSION/DISCECTOMY FUSION CERVICAL FOUR- CERVICAL FIVE;  Surgeon: Ashok Pall, MD;  Location: [place];  Service: Neurosurgery;  Laterality: N/A;  ANTERIOR CERVICAL DECOMPRESSION/DISCECTOMY FUSION CERVICAL FOUR- CERVICAL FIVE  . AORTA - BILATERAL FEMORAL ARTERY BYPASS GRAFT N/A 11/20/2014   Procedure: AORTA BIFEMORAL BYPASS GRAFT;  Surgeon: Mal Misty, MD;  Location: [facility];  Service: Vascular;  Laterality: N/A;  . BACK SURGERY    . BRAIN SURGERY  1989   subdural hematoma  . CARDIAC CATHETERIZATION     Stent - 12  . CHOLECYSTECTOMY N/A 06/29/2017   Procedure: LAPAROSCOPIC CHOLECYSTECTOMY WITH INTRAOPERATIVE CHOLANGIOGRAM, POSSIBLE OPEN;  Surgeon: Robert Bellow, MD;  Location: [facility];  Service: General;  Laterality: N/A;  . CORONARY ANGIOPLASTY WITH STENT PLACEMENT  03/2007   to RCA  . CORONARY/GRAFT ACUTE MI REVASCULARIZATION N/A 03/07/2018   Procedure: Coronary/Graft Acute MI Revascularization;  Surgeon: Isaias Cowman, MD;  Location: [facility];  Service: Cardiovascular;  Laterality: N/A;  . HERNIA REPAIR    . ILIAC ARTERY STENT  10/22/2012   "2" (10/22/2012)  . ILIAC ARTERY STENT  06/2012   left  . INCISIONAL HERNIA REPAIR N/A 11/08/2015   Procedure: INCISIONAL HERNIA REPAIR WITH MYOFASCIAL RELEASE;  Surgeon: Rolm Bookbinder, MD;  Location: [place];  Service: General;  Laterality: N/A;  . INSERTION OF MESH N/A 11/08/2015   Procedure: INSERTION OF MESH;  Surgeon: Rolm Bookbinder, MD;  Location: [facility];  Service: General;  Laterality: N/A;  . LEFT HEART CATH AND CORONARY ANGIOGRAPHY N/A 03/07/2018   Procedure: LEFT HEART  CATH AND CORONARY ANGIOGRAPHY;  Surgeon: Isaias Cowman, MD;  Location: [facility];  Service: Cardiovascular;  Laterality: N/A;  . LEFT HEART CATHETERIZATION WITH CORONARY ANGIOGRAM N/A 05/29/2012   Procedure: LEFT HEART CATHETERIZATION WITH CORONARY ANGIOGRAM;  Surgeon: Wellington Hampshire, MD;  Location: [facility];  Service: Cardiovascular;  Laterality: N/A;  . LIVER BIOPSY  10/2012  . LOWER EXTREMITY ANGIOGRAM N/A 05/29/2012   Procedure: LOWER EXTREMITY ANGIOGRAM;  Surgeon: Wellington Hampshire, MD;  Location: [facility];  Service: Cardiovascular;  Laterality: N/A;  . MICROLARYNGOSCOPY WITH CO2 LASER AND EXCISION OF VOCAL CORD LESION N/A 11/21/2017   Procedure: MICROLARYNGOSCOPY WITH EXCISION OF VOCAL CORD LESION;  Surgeon: Jerrell Belfast, MD;  Location: [place];  Service: ENT;  Laterality: N/A;  . PERCUTANEOUS STENT INTERVENTION Left 05/29/2012   Procedure: PERCUTANEOUS STENT INTERVENTION;  Surgeon: Wellington Hampshire, MD;  Location: [facility];  Service: Cardiovascular;  Laterality: Left;  . POSTERIOR FUSION CERVICAL SPINE  2012  . SUBDURAL  HEMATOMA EVACUATION VIA CRANIOTOMY      Prior to Admission medications   Medication Sig Start Date End Date Taking? Authorizing Provider  carvedilol (COREG) 3.125 MG tablet Take 3.125 mg by mouth 2 (two) times daily with a meal.    Yes [provider]  divalproex (DEPAKOTE ER) 500 MG 24 hr tablet Take 1,000 mg by mouth at bedtime.  06/17/17  Yes [provider]  pantoprazole (PROTONIX) 40 MG tablet Take 40 mg by mouth daily. In the afternoon. 06/10/17  Yes [provider]  QUEtiapine (SEROQUEL) 50 MG tablet Take 25 mg by mouth every 4 (four) hours as needed for agitation. 07/09/19  Yes [provider]  telmisartan (MICARDIS) 40 MG tablet Take 1 tablet (40 mg total) by mouth daily. 06/04/19  Yes Dunn, Areta Haber, PA-C  ticagrelor (BRILINTA) 60 MG TABS tablet Take 1 tablet (60 mg total) by mouth 2 (two) times daily. 06/04/19   Yes Dunn, Ryan M, PA-C  VRAYLAR 4.5 MG CAPS Take 4.5 capsules by mouth at bedtime. 10/01/18  Yes [provider]  alprazolam Duanne Moron) 2 MG tablet Take 1-2 mg by mouth 2 (two) times daily. 10/04/18   [provider]  aspirin 81 MG chewable tablet Chew 1 tablet (81 mg total) by mouth daily. 03/10/18   Saundra Shelling, MD  atorvastatin (LIPITOR) 40 MG tablet Take 1 tablet (40 mg total) by mouth daily at 6 PM. 03/09/18   Pyreddy, Reatha Harps, MD  lithium carbonate (ESKALITH) 450 MG CR tablet Take 450 mg by mouth 2 (two) times daily.    [provider]    Allergies Lisinopril  Family History  Problem Relation Age of Onset  . Emphysema Father   . COPD Mother   . Diabetes Other   . Alcohol abuse Other   . Hypertension Other   . Hyperlipidemia Other   . Seizures Other     Social History Social History   Tobacco Use  . Smoking status: Current Every Day Smoker    Packs/day: 2.00    Years: 40.00    Pack years: 80.00    Types: Cigarettes  . Smokeless tobacco: Never Used  . Tobacco comment: "ive slowed down recently"   Substance Use Topics  . Alcohol use: Yes    Alcohol/week: 0.0 standard drinks    Comment: 1/2 gallon or 20 beers daily  . Drug use: Yes    Types: Marijuana    Comment: last smoked 1 month ago    Review of Systems  Constitutional: Negative for fever. Eyes: Negative for visual changes. ENT: Negative for sore throat. Neck: No neck pain  Cardiovascular: + chest pain. Respiratory: Negative for shortness of breath. Gastrointestinal: Negative for abdominal pain, vomiting or diarrhea. + nausea Genitourinary: Negative for dysuria. Musculoskeletal: Negative for back pain. Skin: Negative for rash. Neurological: Negative for headaches, weakness or numbness. + tremor Psych: + SI. No HI  ____________________________________________   PHYSICAL EXAM:  VITAL SIGNS: ED Triage Vitals  Enc Vitals Group     BP 08/25/19 0353 (!) 165/105     Pulse Rate  08/25/19 0353 78     Resp 08/25/19 0353 17     Temp 08/25/19 0353 98.4 F (36.9 C)     Temp Source 08/25/19 0353 Oral     SpO2 08/25/19 0353 97 %     Weight 08/25/19 0353 119 lb 14.9 oz (54.4 kg)     Height 08/25/19 0353 5\' 7"  (1.702 m)     Head Circumference --  Peak Flow --      Pain Score 08/25/19 0336 7     Pain Loc --      Pain Edu? --      Excl. in [place]? --     Constitutional: Alert and oriented, looks disheveled and uncomfortable.  HEENT:      Head: Normocephalic and atraumatic.         Eyes: Conjunctivae are normal. Sclera is non-icteric.       Mouth/Throat: Mucous membranes are moist.       Neck: Supple with no signs of meningismus. Cardiovascular: Regular rate and rhythm. No murmurs, gallops, or rubs. 2+ symmetrical distal pulses are present in all extremities. No JVD.  Palpation of the left chest wall reproduces the pain. Respiratory: Normal respiratory effort. Lungs are clear to auscultation bilaterally. No wheezes, crackles, or rhonchi.  Gastrointestinal: Soft, non tender, and non distended with positive bowel sounds. No rebound or guarding. Musculoskeletal: Nontender with normal range of motion in all extremities. No edema, cyanosis, or erythema of extremities. Neurologic: Normal speech and language. Face is symmetric. Moving all extremities. No gross focal neurologic deficits are appreciated. Tremor Skin: Skin is warm, dry and intact. No rash noted. Psychiatric: Mood and affect are normal. Speech and behavior are normal.  ____________________________________________   LABS (all labs ordered are listed, but only abnormal results are displayed)  Labs Reviewed  BASIC METABOLIC PANEL - Abnormal; Notable for the following components:      Result Value   Glucose, Bld 108 (*)    All other components within normal limits  CBC - Abnormal; Notable for the following components:   Platelets 148 (*)    All other components within normal limits  ETHANOL - Abnormal;  Notable for the following components:   Alcohol, Ethyl (B) 268 (*)    All other components within normal limits  TROPONIN I (HIGH SENSITIVITY) - Abnormal; Notable for the following components:   Troponin I (High Sensitivity) 19 (*)    All other components within normal limits  SARS CORONAVIRUS 2 (TAT 6-24 HRS)  HEPATIC FUNCTION PANEL  URINE DRUG SCREEN, QUALITATIVE ([facility] ONLY)   ____________________________________________  EKG  ED ECG REPORT I, Rudene Re, the attending physician, personally viewed and interpreted this ECG.  Normal sinus rhythm, rate of 82, normal intervals, left axis deviation, LAFB, anterior Q waves, no ST elevations or depressions.  Unchanged from prior. ____________________________________________  RADIOLOGY  I have personally reviewed the images performed during this visit and I agree with the Radiologist's read.   Interpretation by Radiologist:  Dg Chest 1 View  Result Date: 08/25/2019 CLINICAL DATA:  Seizure. EXAM: CHEST  1 VIEW COMPARISON:  Ten days ago FINDINGS: Normal heart size. Left and right coronary circulation stents. Normal aortic and hilar contours. Right apical bleb. There is no edema, consolidation, effusion, or pneumothorax. Remote bilateral rib fractures. IMPRESSION: No evidence of acute disease. Electronically Signed   By: Monte Fantasia M.D.   On: 08/25/2019 04:23   Ct Head Wo Contrast  Result Date: 08/25/2019 CLINICAL DATA:  Initial evaluation for acute head trauma. EXAM: CT HEAD WITHOUT CONTRAST TECHNIQUE: Contiguous axial images were obtained from the base of the skull through the vertex without intravenous contrast. COMPARISON:  Prior MRI from 07/17/2007. FINDINGS: Brain: Examination mildly degraded by motion artifact. Mild age-related cerebral atrophy with chronic small vessel ischemic disease. No acute intracranial hemorrhage. No acute large vessel territory infarct. No mass lesion, midline shift or mass effect. No hydrocephalus.  No extra-axial  fluid collection. Vascular: No hyperdense vessel. Scattered vascular calcifications noted within the carotid siphons. Skull: Scalp soft tissues demonstrate no acute finding. Prior right craniotomy. No calvarial fracture. Sinuses/Orbits: Globes and orbital soft tissues within normal limits. Paranasal sinuses mastoid air cells are clear. Other: None. IMPRESSION: 1. No acute intracranial abnormality. 2. Mild age-related cerebral atrophy with chronic small vessel ischemic disease. 3. Prior right craniotomy. Electronically Signed   By: Jeannine Boga M.D.   On: 08/25/2019 04:58     ____________________________________________   PROCEDURES  Procedure(s) performed: None Procedures Critical Care performed: yes   CRITICAL CARE Performed by: Rudene Re  ?  Total critical care time: 35 min  Critical care time was exclusive of separately billable procedures and treating other patients.  Critical care was necessary to treat or prevent imminent or life-threatening deterioration.  Critical care was time spent personally by me on the following activities: development of treatment plan with patient and/or surrogate as well as nursing, discussions with consultants, evaluation of patient's response to treatment, examination of patient, obtaining history from patient or surrogate, ordering and performing treatments and interventions, ordering and review of laboratory studies, ordering and review of radiographic studies, pulse oximetry and re-evaluation of patient's condition.  ____________________________________________   INITIAL IMPRESSION / ASSESSMENT AND PLAN / ED COURSE  58 y.o. male with a history of alcohol abuse, CAD, hepatitis C, hypertension, hyperlipidemia, schizophrenia, seizure disorder who presents for evaluation of chest pain and suicidal ideation. Patient had a seizure in the waiting room with a CIWA score of 23 concerning for complicated alcohol withdrawal.   Patient was started on IV Ativan and p.o. Librium.  CT head was done to evaluate for subdural hematoma that was negative.  His chest pain is most likely musculoskeletal based on history and physical exam.  EKG showing no acute ischemic changes.  First troponin is 19.  Will get a second troponin.  Chest x-ray showed no evidence of pneumothorax or rib fractures.  Alcohol level of 268 with no significant electrolyte abnormalities.  Patient was IVC.  Psychiatry has been consulted.  Patient be admitted to the hospitalist service for complicated alcohol withdrawal       As part of my medical decision making, I reviewed the following data within the [place] notes reviewed and incorporated, Labs reviewed , EKG interpreted , Old EKG reviewed, Old chart reviewed, Radiograph reviewed , Discussed with admitting physician , Notes from prior ED visits and [place] Controlled Substance Database   Patient was evaluated in Emergency Department today for the symptoms described in the history of present illness. Patient was evaluated in the context of the global COVID-19 pandemic, which necessitated consideration that the patient might be at risk for infection with the SARS-CoV-2 virus that causes COVID-19. Institutional protocols and algorithms that pertain to the evaluation of patients at risk for COVID-19 are in a state of rapid change based on information released by regulatory bodies including the CDC and federal and state organizations. These policies and algorithms were followed during the patient's care in the ED.   ____________________________________________   FINAL CLINICAL IMPRESSION(S) / ED DIAGNOSES   Final diagnoses:  Alcohol withdrawal syndrome with complication ([place])  Alcohol withdrawal seizure with complication ([place])  Suicidal ideation      NEW MEDICATIONS STARTED DURING THIS VISIT:  ED Discharge Orders    None       Note:  This document was prepared using  Dragon voice recognition software and may include unintentional dictation  errors.    Alfred Levins, [place], MD 08/25/19 [phone]

## 2019-08-25 NOTE — ED Notes (Signed)
Pt to CT via stretcher with CT tech and security.

## 2019-08-25 NOTE — ED Notes (Signed)

## 2019-08-25 NOTE — ED Notes (Signed)
BEHAVIORAL HEALTH ROUNDING Patient sleeping: Yes.   Patient alert and oriented: not applicable Behavior appropriate: Yes.  ; If no, describe:  Nutrition and fluids offered: No Toileting and hygiene offered: No Sitter present: not applicable Law enforcement present: Yes  
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
## 2019-08-25 NOTE — ED Notes (Signed)
EKG was just completed when pt began to have seizure; shaking all over; called charge nurse for a bed;

## 2019-08-25 NOTE — ED Notes (Signed)
Resting in bed, awakens with gentle shaking, minimal conversation, reminded is in hospital and will be getting in more comfortable bed at admission, returns to sleep.

## 2019-08-25 NOTE — Progress Notes (Signed)
Patient reports approximately 60 lb weight loss due to self induced vomiting following meals.

## 2019-08-25 NOTE — ED Notes (Addendum)
ED TO INPATIENT HANDOFF REPORT  ED Nurse Name and Phone #: [phone]  S Name/Age/Gender [name] 58 y.o. male Room/Bed: ED26A/ED26A  Code Status   Code Status: Prior  Home/SNF/Other Home Patient oriented to: self Is this baseline? no  Triage Complete: Triage complete  Chief Complaint Psych Eval. ( Chest Pain)  Triage Note Pt arrived with [facility] sheriff's deputy voluntarily; pt says he's suicidal-"I got a lot going on"; pt says his mother recently passed away; adds he's been drinking heavily today; pt also has been off his Depakote for several days and reports a seizure earlier; pt is also having left sided chest pain;    Allergies Allergies  Allergen Reactions  . Lisinopril Other (See Comments)    Other reaction(s): Elevated creatine and very elevated potassium     Level of Care/Admitting Diagnosis ED Disposition    ED Disposition Condition [facility] Area: [place] [100120]  Level of Care: Stepdown [14]  Covid Evaluation: Asymptomatic Screening Protocol (No Symptoms)  Diagnosis: Seizure [facility]) [id number]  Admitting Physician: Harrie Foreman [id number]  Attending Physician: Harrie Foreman [id number]  Estimated length of stay: past midnight tomorrow  Certification:: I certify this patient will need inpatient services for at least 2 midnights  PT Class (Do Not Modify): Inpatient [101]  PT Acc Code (Do Not Modify): Private [1]       B Medical/Surgery History Past Medical History:  Diagnosis Date  . Anxiety   . Bipolar affective ([place])    takes Lithium daily  . Broken fingers   . Coronary artery disease    a. 1997 PCI: RCA ([facility]); b. 2008 PCI: RCA; c. 05/2012 Cath: LCx 100 (previously stented - ? date), RCA stent w/ mild ISR, LAD nonobs, EF 35%; d. 06/2013 Cath: stable anatomy; e. 03/2018 Inf STEMI/PCI: LCX 100o/p ISR (CTO), RCA 14m/d (3.0x38 Resolute Onyx DES), EF 40-45%.   . Depression   . GERD (gastroesophageal  reflux disease)    takes Protonix daily  . Hepatitis C    "diagnosed in the past 2 wk" (10/22/2012)  . History of ETOH abuse    quit 2007  . History of radiation therapy 12/17/17- 01/24/18   Larynx treated to 63 gy in 28 fx of 2.25 Gy  . Hyperlipidemia   . Hypertension    "pt. denies - no longer taking blood pressure medications"  . Ischemic cardiomyopathy    a. 03/2018 LV gram: EF 40-45% @ time of MI.  Marland Kitchen Myocardial infarction ([place])    x 4  . PAD (peripheral artery disease) ([place])    a. 2010 PTA & stent to left CIA & left SFA; b. 05/2012 PTA & stent to distal LCIA; c. 10/2012 thrombectomy and stents to [place]; d. 11/2014 s/p Ao-Bifem bypass.  . Schizophrenia ([place])   . Seizures ([place])    "when I get anxious" (Stated last seizure three months ago)  . Tobacco abuse   . Urinary frequency    Past Surgical History:  Procedure Laterality Date  . ABDOMINAL AORTAGRAM N/A 10/22/2012   Procedure: ABDOMINAL Maxcine Ham;  Surgeon: Wellington Hampshire, MD;  Location: [place] CATH LAB;  Service: Cardiovascular;  Laterality: N/A;  . ABDOMINAL AORTAGRAM N/A 11/11/2014   Procedure: ABDOMINAL Maxcine Ham;  Surgeon: Wellington Hampshire, MD;  Location: [place] CATH LAB;  Service: Cardiovascular;  Laterality: N/A;  . ANTERIOR CERVICAL DECOMP/DISCECTOMY FUSION  01/2011   plate & screw placement   . ANTERIOR CERVICAL DECOMP/DISCECTOMY FUSION  N/A 01/26/2017   Procedure: ANTERIOR CERVICAL DECOMPRESSION/DISCECTOMY FUSION CERVICAL FOUR- CERVICAL FIVE;  Surgeon: Ashok Pall, MD;  Location: [place];  Service: Neurosurgery;  Laterality: N/A;  ANTERIOR CERVICAL DECOMPRESSION/DISCECTOMY FUSION CERVICAL FOUR- CERVICAL FIVE  . AORTA - BILATERAL FEMORAL ARTERY BYPASS GRAFT N/A 11/20/2014   Procedure: AORTA BIFEMORAL BYPASS GRAFT;  Surgeon: Mal Misty, MD;  Location: [place];  Service: Vascular;  Laterality: N/A;  . BACK SURGERY    . BRAIN SURGERY  1989   subdural hematoma  . CARDIAC CATHETERIZATION     Stent - 12  . CHOLECYSTECTOMY  N/A 06/29/2017   Procedure: LAPAROSCOPIC CHOLECYSTECTOMY WITH INTRAOPERATIVE CHOLANGIOGRAM, POSSIBLE OPEN;  Surgeon: Robert Bellow, MD;  Location: [facility];  Service: General;  Laterality: N/A;  . CORONARY ANGIOPLASTY WITH STENT PLACEMENT  03/2007   to RCA  . CORONARY/GRAFT ACUTE MI REVASCULARIZATION N/A 03/07/2018   Procedure: Coronary/Graft Acute MI Revascularization;  Surgeon: Isaias Cowman, MD;  Location: [place] CV LAB;  Service: Cardiovascular;  Laterality: N/A;  . HERNIA REPAIR    . ILIAC ARTERY STENT  10/22/2012   "2" (10/22/2012)  . ILIAC ARTERY STENT  06/2012   left  . INCISIONAL HERNIA REPAIR N/A 11/08/2015   Procedure: INCISIONAL HERNIA REPAIR WITH MYOFASCIAL RELEASE;  Surgeon: Rolm Bookbinder, MD;  Location: [place];  Service: General;  Laterality: N/A;  . INSERTION OF MESH N/A 11/08/2015   Procedure: INSERTION OF MESH;  Surgeon: Rolm Bookbinder, MD;  Location: [place];  Service: General;  Laterality: N/A;  . LEFT HEART CATH AND CORONARY ANGIOGRAPHY N/A 03/07/2018   Procedure: LEFT HEART CATH AND CORONARY ANGIOGRAPHY;  Surgeon: Isaias Cowman, MD;  Location: [place] CV LAB;  Service: Cardiovascular;  Laterality: N/A;  . LEFT HEART CATHETERIZATION WITH CORONARY ANGIOGRAM N/A 05/29/2012   Procedure: LEFT HEART CATHETERIZATION WITH CORONARY ANGIOGRAM;  Surgeon: Wellington Hampshire, MD;  Location: [place] CATH LAB;  Service: Cardiovascular;  Laterality: N/A;  . LIVER BIOPSY  10/2012  . LOWER EXTREMITY ANGIOGRAM N/A 05/29/2012   Procedure: LOWER EXTREMITY ANGIOGRAM;  Surgeon: Wellington Hampshire, MD;  Location: [place] CATH LAB;  Service: Cardiovascular;  Laterality: N/A;  . MICROLARYNGOSCOPY WITH CO2 LASER AND EXCISION OF VOCAL CORD LESION N/A 11/21/2017   Procedure: MICROLARYNGOSCOPY WITH EXCISION OF VOCAL CORD LESION;  Surgeon: Jerrell Belfast, MD;  Location: [place];  Service: ENT;  Laterality: N/A;  . PERCUTANEOUS STENT INTERVENTION Left 05/29/2012   Procedure: PERCUTANEOUS  STENT INTERVENTION;  Surgeon: Wellington Hampshire, MD;  Location: [place] CATH LAB;  Service: Cardiovascular;  Laterality: Left;  . POSTERIOR FUSION CERVICAL SPINE  2012  . SUBDURAL HEMATOMA EVACUATION VIA CRANIOTOMY       A IV Location/Drains/Wounds Patient Lines/Drains/Airways Status   Active Line/Drains/Airways    Name:   Placement date:   Placement time:   Site:   Days:   Peripheral IV 08/25/19 Left Wrist   08/25/19    0407    Wrist   less than 1          Intake/Output Last 24 hours  Intake/Output Summary (Last 24 hours) at 08/25/2019 [id number] Last data filed at 08/25/2019 0550 Gross per 24 hour  Intake 91.76 ml  Output -  Net 91.76 ml    Labs/Imaging Results for orders placed or performed during the hospital encounter of 08/25/19 (from the past 48 hour(s))  Basic metabolic panel     Status: Abnormal   Collection Time: 08/25/19  4:00 AM  Result Value Ref Range  Sodium 142 135 - 145 mmol/L   Potassium 4.1 3.5 - 5.1 mmol/L   Chloride 106 98 - 111 mmol/L   CO2 23 22 - 32 mmol/L   Glucose, Bld 108 (H) 70 - 99 mg/dL   BUN 9 6 - 20 mg/dL   Creatinine, Ser 0.66 0.61 - 1.24 mg/dL   Calcium 9.1 8.9 - 10.3 mg/dL   GFR calc non Af Amer >60 >60 mL/min   GFR calc Af Amer >60 >60 mL/min   Anion gap 13 5 - 15    Comment: Performed at [facility], [address] [place], [place] [address]  CBC     Status: Abnormal   Collection Time: 08/25/19  4:00 AM  Result Value Ref Range   WBC 5.0 4.0 - 10.5 K/uL   RBC 4.59 4.22 - 5.81 MIL/uL   Hemoglobin 15.3 13.0 - 17.0 g/dL   HCT 44.6 39.0 - 52.0 %   MCV 97.2 80.0 - 100.0 fL   MCH 33.3 26.0 - 34.0 pg   MCHC 34.3 30.0 - 36.0 g/dL   RDW 12.5 11.5 - 15.5 %   Platelets 148 (L) 150 - 400 K/uL   nRBC 0.0 0.0 - 0.2 %    Comment: Performed at [facility], [place], [place] [address]  Troponin I (High Sensitivity)     Status: Abnormal   Collection Time: 08/25/19  4:00 AM  Result Value Ref Range   Troponin I (High  Sensitivity) 19 (H) <18 ng/L    Comment: (NOTE) Elevated high sensitivity troponin I (hsTnI) values and significant  changes across serial measurements may suggest ACS but many other  chronic and acute conditions are known to elevate hsTnI results.  Refer to the "Links" section for chest pain algorithms and additional  guidance. Performed at [facility], [address], [place] [address]   Hepatic function panel     Status: None   Collection Time: 08/25/19  4:00 AM  Result Value Ref Range   Total Protein 7.8 6.5 - 8.1 g/dL   Albumin 4.8 3.5 - 5.0 g/dL   AST 34 15 - 41 U/L   ALT 29 0 - 44 U/L   Alkaline Phosphatase 43 38 - 126 U/L   Total Bilirubin 0.7 0.3 - 1.2 mg/dL   Bilirubin, Direct <0.1 0.0 - 0.2 mg/dL   Indirect Bilirubin NOT CALCULATED 0.3 - 0.9 mg/dL    Comment: Performed at [facility], [address] [place], [place] [address]  Ethanol     Status: Abnormal   Collection Time: 08/25/19  4:00 AM  Result Value Ref Range   Alcohol, Ethyl (B) 268 (H) <10 mg/dL    Comment: (NOTE) Lowest detectable limit for serum alcohol is 10 mg/dL. For medical purposes only. Performed at [facility], [address] [place], [place] [address]   Lithium level     Status: Abnormal   Collection Time: 08/25/19  4:00 AM  Result Value Ref Range   Lithium Lvl <0.06 (L) 0.60 - 1.20 mmol/L    Comment: Performed at [facility], [place], [place] [address]  Valproic acid level     Status: Abnormal   Collection Time: 08/25/19  4:00 AM  Result Value Ref Range   Valproic Acid Lvl 14 (L) 50.0 - 100.0 ug/mL    Comment: Performed at [facility], [address] [place],  [address]  Urine Drug Screen, Qualitative ([place] only)  Status: None   Collection Time: 08/25/19  5:11 AM  Result Value Ref Range   Tricyclic, Ur Screen NONE DETECTED NONE DETECTED   Amphetamines, Ur Screen NONE DETECTED NONE DETECTED   MDMA  (Ecstasy)Ur Screen NONE DETECTED NONE DETECTED   Cocaine Metabolite,Ur [place] NONE DETECTED NONE DETECTED   Opiate, Ur Screen NONE DETECTED NONE DETECTED   Phencyclidine (PCP) Ur S NONE DETECTED NONE DETECTED   Cannabinoid 50 Ng, Ur [place] NONE DETECTED NONE DETECTED   Barbiturates, Ur Screen NONE DETECTED NONE DETECTED   Benzodiazepine, Ur Scrn NONE DETECTED NONE DETECTED   Methadone Scn, Ur NONE DETECTED NONE DETECTED    Comment: (NOTE) Tricyclics + metabolites, urine    Cutoff 1000 ng/mL Amphetamines + metabolites, urine  Cutoff 1000 ng/mL MDMA (Ecstasy), urine              Cutoff 500 ng/mL Cocaine Metabolite, urine          Cutoff 300 ng/mL Opiate + metabolites, urine        Cutoff 300 ng/mL Phencyclidine (PCP), urine         Cutoff 25 ng/mL Cannabinoid, urine                 Cutoff 50 ng/mL Barbiturates + metabolites, urine  Cutoff 200 ng/mL Benzodiazepine, urine              Cutoff 200 ng/mL Methadone, urine                   Cutoff 300 ng/mL The urine drug screen provides only a preliminary, unconfirmed analytical test result and should not be used for non-medical purposes. Clinical consideration and professional judgment should be applied to any positive drug screen result due to possible interfering substances. A more specific alternate chemical method must be used in order to obtain a confirmed analytical result. Gas chromatography / mass spectrometry (GC/MS) is the preferred confirmat ory method. Performed at [facility], [address] [place], [place] [address]    Dg Chest 1 View  Result Date: 08/25/2019 CLINICAL DATA:  Seizure. EXAM: CHEST  1 VIEW COMPARISON:  Ten days ago FINDINGS: Normal heart size. Left and right coronary circulation stents. Normal aortic and hilar contours. Right apical bleb. There is no edema, consolidation, effusion, or pneumothorax. Remote bilateral rib fractures. IMPRESSION: No evidence of acute disease. Electronically Signed   By: Monte Fantasia M.D.   On: 08/25/2019 04:23   Ct Head Wo Contrast  Result Date: 08/25/2019 CLINICAL DATA:  Initial evaluation for acute head trauma. EXAM: CT HEAD WITHOUT CONTRAST TECHNIQUE: Contiguous axial images were obtained from the base of the skull through the vertex without intravenous contrast. COMPARISON:  Prior MRI from 07/17/2007. FINDINGS: Brain: Examination mildly degraded by motion artifact. Mild age-related cerebral atrophy with chronic small vessel ischemic disease. No acute intracranial hemorrhage. No acute large vessel territory infarct. No mass lesion, midline shift or mass effect. No hydrocephalus. No extra-axial fluid collection. Vascular: No hyperdense vessel. Scattered vascular calcifications noted within the carotid siphons. Skull: Scalp soft tissues demonstrate no acute finding. Prior right craniotomy. No calvarial fracture. Sinuses/Orbits: Globes and orbital soft tissues within normal limits. Paranasal sinuses mastoid air cells are clear. Other: None. IMPRESSION: 1. No acute intracranial abnormality. 2. Mild age-related cerebral atrophy with chronic small vessel ischemic disease. 3. Prior right craniotomy. Electronically Signed   By: Jeannine Boga M.D.   On: 08/25/2019 04:58    Pending Labs Unresulted Labs (From admission, onward)  Start     Ordered   08/25/19 0651  SARS Coronavirus 2 [facility] order, Performed in [facility] hospital lab) Nasopharyngeal Nasopharyngeal Swab  (Symptomatic/High Risk of Exposure/Tier 1 Patients Labs with Precautions)  Once,   STAT    Question Answer Comment  Is this test for diagnosis or screening Diagnosis of ill patient   Symptomatic for COVID-19 as defined by CDC No   Hospitalized for COVID-19 No   Admitted to ICU for COVID-19 No   Previously tested for COVID-19 No   Resident in a congregate (group) care setting No   Employed in healthcare setting No      08/25/19 0651   Signed and Held  Phosphorus  Add-on,   R     Signed and Held    Signed and Held  TSH  Add-on,   R     Signed and Held   Signed and Held  Creatinine, serum  (enoxaparin (LOVENOX)    CrCl >/= 30 ml/min)  Weekly,   R    Comments: while on enoxaparin therapy    Signed and Held   Signed and Held  Magnesium  Add-on,   R     Signed and Held          Vitals/Pain Today's Vitals   08/25/19 0500 08/25/19 0531 08/25/19 0600 08/25/19 0630  BP: 120/87 (!) 153/102 (!) 160/98 (!) 159/101  Pulse: 89 82 89   Resp:  15 14   Temp:      TempSrc:      SpO2: 100% 98% 98%   Weight:      Height:      PainSc:        Isolation Precautions Airborne and Contact precautions  Medications Medications  chlordiazePOXIDE (LIBRIUM) capsule 50 mg (50 mg Oral Given 08/25/19 0527)  ondansetron (ZOFRAN) tablet 4 mg ( Oral See Alternative 08/25/19 0628)    Or  ondansetron (ZOFRAN) injection 4 mg (4 mg Intravenous Given 08/25/19 0628)  sodium chloride flush (NS) 0.9 % injection 3 mL (3 mLs Intravenous Given 08/25/19 0631)  LORazepam (ATIVAN) injection 2 mg (2 mg Intravenous Given 08/25/19 0409)  levETIRAcetam (KEPPRA) IVPB 1000 mg/100 mL premix (0 mg Intravenous Stopped 08/25/19 0550)    Mobility walks High fall risk   Focused Assessments Cardiac Assessment Handoff:  Cardiac Rhythm: Normal sinus rhythm Lab Results  Component Value Date   TROPONINI <0.03 10/19/2018   No results found for: DDIMER Does the Patient currently have chest pain? No     R Recommendations: See Admitting Provider Note  Report given to:   Additional Notes: patient is IVC and needs one to one sitter

## 2019-08-25 NOTE — ED Notes (Signed)
Report to [place], await sitter assignment for transport to floor.

## 2019-08-25 NOTE — ED Notes (Signed)
Patient cont to sleep when undisturbed. Monitor SR with occasional PVCs. Await transfer to floor.

## 2019-08-25 NOTE — Progress Notes (Signed)
Patient seen today this morning Patient has been admitted this morning for the hospital Was in postictal state and lethargic Unable to give any history Has been involuntarily committed on one-on-one observation for suicidal prevention and patient safety Follow-up psychiatry consult Cycle troponin and follow-up cardiology consult Monitor patient on telemetry

## 2019-08-25 NOTE — Progress Notes (Signed)
His HR is in the 70's but he's in and out of trigeminy.  Dr. Estanislado Pandy made aware.  Instructions to monitor.

## 2019-08-25 NOTE — ED Notes (Signed)
Patient being changed to tele bed, await assignment.

## 2019-08-25 NOTE — ED Triage Notes (Signed)
Pt arrived with [facility] sheriff's deputy voluntarily; pt says he's suicidal-"I got a lot going on"; pt says his mother recently passed away; adds he's been drinking heavily today; pt also has been off his Depakote for several days and reports a seizure earlier; pt is also having left sided chest pain;

## 2019-08-25 NOTE — H&P (Signed)
[name] is an 58 y.o. male.   Chief Complaint: Seizure HPI: The patient with past medical history of coronary artery disease status post PCI, hypertension, hyperlipidemia, alcohol abuse and depression presents to the emergency department due to suicidal ideation.  The patient admits to being very depressed since the death of his mother 4 months ago and he also admits to drinking a gallon of liquor a day in addition to up to 20 beers.  His last drink was approximately an hour prior to arrival.  The patient had a witnessed seizure in the waiting room.  Ativan 2 mg IV was administered which stopped seizure activity.  The patient admits to having a seizure earlier today or yesterday as well.  Notably, the patient also reports chest pain at some point today, although he admits that it is not like pain he has had with previous myocardial infarction's.  Initial troponin was mildly elevated at 19.  EKG did not show signs of ischemia.  Once the patient was stabilized the emergency department staff called the hospitalist service for admission.  Past Medical History:  Diagnosis Date  . Anxiety   . Bipolar affective ([place])    takes Lithium daily  . Broken fingers   . Coronary artery disease    a. 1997 PCI: RCA ([facility]); b. 2008 PCI: RCA; c. 05/2012 Cath: LCx 100 (previously stented - ? date), RCA stent w/ mild ISR, LAD nonobs, EF 35%; d. 06/2013 Cath: stable anatomy; e. 03/2018 Inf STEMI/PCI: LCX 100o/p ISR (CTO), RCA 14m/d (3.0x38 Resolute Onyx DES), EF 40-45%.   . Depression   . GERD (gastroesophageal reflux disease)    takes Protonix daily  . Hepatitis C    "diagnosed in the past 2 wk" (10/22/2012)  . History of ETOH abuse    quit 2007  . History of radiation therapy 12/17/17- 01/24/18   Larynx treated to 63 gy in 28 fx of 2.25 Gy  . Hyperlipidemia   . Hypertension    "pt. denies - no longer taking blood pressure medications"  . Ischemic cardiomyopathy    a. 03/2018 LV gram: EF 40-45% @ time of MI.   Marland Kitchen Myocardial infarction ([place])    x 4  . PAD (peripheral artery disease) ([place])    a. 2010 PTA & stent to left CIA & left SFA; b. 05/2012 PTA & stent to distal LCIA; c. 10/2012 thrombectomy and stents to [place]; d. 11/2014 s/p Ao-Bifem bypass.  . Schizophrenia ([place])   . Seizures ([place])    "when I get anxious" (Stated last seizure three months ago)  . Tobacco abuse   . Urinary frequency     Past Surgical History:  Procedure Laterality Date  . ABDOMINAL AORTAGRAM N/A 10/22/2012   Procedure: ABDOMINAL Maxcine Ham;  Surgeon: Wellington Hampshire, MD;  Location: [facility] C[facility];  Service: Cardiovascular;  Laterality: N/A;  . ABDOMINAL AORTAGRAM N/A 11/11/2014   Procedure: ABDOMINAL Maxcine Ham;  Surgeon: Wellington Hampshire, MD;  Location: [facility] CATH [facility];  Service: Cardiovascular;  Laterality: N/A;  . ANTERIOR CERVICAL DECOMP/DISCECTOMY FUSION  01/2011   plate & screw placement   . ANTERIOR CERVICAL DECOMP/DISCECTOMY FUSION N/A 01/26/2017   Procedure: ANTERIOR CERVICAL DECOMPRESSION/DISCECTOMY FUSION CERVICAL FOUR- CERVICAL FIVE;  Surgeon: Ashok Pall, MD;  Location: [facility];  Service: Neurosurgery;  Laterality: N/A;  ANTERIOR CERVICAL DECOMPRESSION/DISCECTOMY FUSION CERVICAL FOUR- CERVICAL FIVE  . AORTA - BILATERAL FEMORAL ARTERY BYPASS GRAFT N/A 11/20/2014   Procedure: AORTA BIFEMORAL BYPASS GRAFT;  Surgeon: Nelda Severe  Kellie Simmering, MD;  Location: [facility];  Service: Vascular;  Laterality: N/A;  . BACK SURGERY    . BRAIN SURGERY  1989   subdural hematoma  . CARDIAC CATHETERIZATION     Stent - 12  . CHOLECYSTECTOMY N/A 06/29/2017   Procedure: LAPAROSCOPIC CHOLECYSTECTOMY WITH INTRAOPERATIVE CHOLANGIOGRAM, POSSIBLE OPEN;  Surgeon: Robert Bellow, MD;  Location: [facility];  Service: General;  Laterality: N/A;  . CORONARY ANGIOPLASTY WITH STENT PLACEMENT  03/2007   to RCA  . CORONARY/GRAFT ACUTE MI REVASCULARIZATION N/A 03/07/2018   Procedure: Coronary/Graft Acute MI Revascularization;  Surgeon: Isaias Cowman,  MD;  Location: [facility];  Service: Cardiovascular;  Laterality: N/A;  . HERNIA REPAIR    . ILIAC ARTERY STENT  10/22/2012   "2" (10/22/2012)  . ILIAC ARTERY STENT  06/2012   left  . INCISIONAL HERNIA REPAIR N/A 11/08/2015   Procedure: INCISIONAL HERNIA REPAIR WITH MYOFASCIAL RELEASE;  Surgeon: Rolm Bookbinder, MD;  Location: [facility];  Service: General;  Laterality: N/A;  . INSERTION OF MESH N/A 11/08/2015   Procedure: INSERTION OF MESH;  Surgeon: Rolm Bookbinder, MD;  Location: [facility];  Service: General;  Laterality: N/A;  . LEFT HEART CATH AND CORONARY ANGIOGRAPHY N/A 03/07/2018   Procedure: LEFT HEART CATH AND CORONARY ANGIOGRAPHY;  Surgeon: Isaias Cowman, MD;  Location: [facility];  Service: Cardiovascular;  Laterality: N/A;  . LEFT HEART CATHETERIZATION WITH CORONARY ANGIOGRAM N/A 05/29/2012   Procedure: LEFT HEART CATHETERIZATION WITH CORONARY ANGIOGRAM;  Surgeon: Wellington Hampshire, MD;  Location: [facility];  Service: Cardiovascular;  Laterality: N/A;  . LIVER BIOPSY  10/2012  . LOWER EXTREMITY ANGIOGRAM N/A 05/29/2012   Procedure: LOWER EXTREMITY ANGIOGRAM;  Surgeon: Wellington Hampshire, MD;  Location: [facility] CATH [facility];  Service: Cardiovascular;  Laterality: N/A;  . MICROLARYNGOSCOPY WITH CO2 LASER AND EXCISION OF VOCAL CORD LESION N/A 11/21/2017   Procedure: MICROLARYNGOSCOPY WITH EXCISION OF VOCAL CORD LESION;  Surgeon: Jerrell Belfast, MD;  Location: [facility];  Service: ENT;  Laterality: N/A;  . PERCUTANEOUS STENT INTERVENTION Left 05/29/2012   Procedure: PERCUTANEOUS STENT INTERVENTION;  Surgeon: Wellington Hampshire, MD;  Location: [facility] C[facility];  Service: Cardiovascular;  Laterality: Left;  . POSTERIOR FUSION CERVICAL SPINE  2012  . SUBDURAL HEMATOMA EVACUATION VIA CRANIOTOMY      Family History  Problem Relation Age of Onset  . Emphysema Father   . COPD Mother   . Diabetes Other   . Alcohol abuse Other   . Hypertension Other   . Hyperlipidemia Other   . Seizures  Other    Social History:  reports that he has been smoking cigarettes. He has a 80.00 pack-year smoking history. He has never used smokeless tobacco. He reports current alcohol use. He reports current drug use. Drug: Marijuana.  Allergies:  Allergies  Allergen Reactions  . Lisinopril Other (See Comments)    Other reaction(s): Elevated creatine and very elevated potassium     (Not in a hospital admission)   Results for orders placed or performed during the hospital encounter of 08/25/19 (from the past 48 hour(s))  Basic metabolic panel     Status: Abnormal   Collection Time: 08/25/19  4:00 AM  Result Value Ref Range   Sodium 142 135 - 145 mmol/L   Potassium 4.1 3.5 - 5.1 mmol/L   Chloride 106 98 - 111 mmol/L   CO2 23 22 - 32 mmol/L   Glucose, Bld 108 (H) 70 - 99 mg/dL  BUN 9 6 - 20 mg/dL   Creatinine, Ser 0.66 0.61 - 1.24 mg/dL   Calcium 9.1 8.9 - 10.3 mg/dL   GFR calc non Af Amer >60 >60 mL/min   GFR calc Af Amer >60 >60 mL/min   Anion gap 13 5 - 15    Comment: Performed at [facility], [address] [place], [place] [address]  CBC     Status: Abnormal   Collection Time: 08/25/19  4:00 AM  Result Value Ref Range   WBC 5.0 4.0 - 10.5 K/uL   RBC 4.59 4.22 - 5.81 MIL/uL   Hemoglobin 15.3 13.0 - 17.0 g/dL   HCT 44.6 39.0 - 52.0 %   MCV 97.2 80.0 - 100.0 fL   MCH 33.3 26.0 - 34.0 pg   MCHC 34.3 30.0 - 36.0 g/dL   RDW 12.5 11.5 - 15.5 %   Platelets 148 (L) 150 - 400 K/uL   nRBC 0.0 0.0 - 0.2 %    Comment: Performed at [facility], [place], [place] [address]  Troponin I (High Sensitivity)     Status: Abnormal   Collection Time: 08/25/19  4:00 AM  Result Value Ref Range   Troponin I (High Sensitivity) 19 (H) <18 ng/L    Comment: (NOTE) Elevated high sensitivity troponin I (hsTnI) values and significant  changes across serial measurements may suggest ACS but many other  chronic and acute conditions are known to elevate hsTnI  results.  Refer to the "Links" section for chest pain algorithms and additional  guidance. Performed at [facility], [address] [place], [place] [address]   Hepatic function panel     Status: None   Collection Time: 08/25/19  4:00 AM  Result Value Ref Range   Total Protein 7.8 6.5 - 8.1 g/dL   Albumin 4.8 3.5 - 5.0 g/dL   AST 34 15 - 41 U/L   ALT 29 0 - 44 U/L   Alkaline Phosphatase 43 38 - 126 U/L   Total Bilirubin 0.7 0.3 - 1.2 mg/dL   Bilirubin, Direct <0.1 0.0 - 0.2 mg/dL   Indirect Bilirubin NOT CALCULATED 0.3 - 0.9 mg/dL    Comment: Performed at [facility], [address] [place], [place] [address]  Ethanol     Status: Abnormal   Collection Time: 08/25/19  4:00 AM  Result Value Ref Range   Alcohol, Ethyl (B) 268 (H) <10 mg/dL    Comment: (NOTE) Lowest detectable limit for serum alcohol is 10 mg/dL. For medical purposes only. Performed at [facility], [address] [place], [place] [address]   Urine Drug Screen, Qualitative [facility] only)     Status: None   Collection Time: 08/25/19  5:11 AM  Result Value Ref Range   Tricyclic, Ur Screen NONE DETECTED NONE DETECTED   Amphetamines, Ur Screen NONE DETECTED NONE DETECTED   MDMA (Ecstasy)Ur Screen NONE DETECTED NONE DETECTED   Cocaine Metabolite,Ur [place] NONE DETECTED NONE DETECTED   Opiate, Ur Screen NONE DETECTED NONE DETECTED   Phencyclidine (PCP) Ur S NONE DETECTED NONE DETECTED   Cannabinoid 50 Ng, Ur [place] NONE DETECTED NONE DETECTED   Barbiturates, Ur Screen NONE DETECTED NONE DETECTED   Benzodiazepine, Ur Scrn NONE DETECTED NONE DETECTED   Methadone Scn, Ur NONE DETECTED NONE DETECTED    Comment: (NOTE) Tricyclics + metabolites, urine    Cutoff 1000 ng/mL Amphetamines + metabolites, urine  Cutoff 1000 ng/mL MDMA (Ecstasy), urine  Cutoff 500 ng/mL Cocaine Metabolite, urine          Cutoff 300 ng/mL Opiate + metabolites, urine        Cutoff 300 ng/mL Phencyclidine  (PCP), urine         Cutoff 25 ng/mL Cannabinoid, urine                 Cutoff 50 ng/mL Barbiturates + metabolites, urine  Cutoff 200 ng/mL Benzodiazepine, urine              Cutoff 200 ng/mL Methadone, urine                   Cutoff 300 ng/mL The urine drug screen provides only a preliminary, unconfirmed analytical test result and should not be used for non-medical purposes. Clinical consideration and professional judgment should be applied to any positive drug screen result due to possible interfering substances. A more specific alternate chemical method must be used in order to obtain a confirmed analytical result. Gas chromatography / mass spectrometry (GC/MS) is the preferred confirmat ory method. Performed at [facility], [address], [place], [place] [address]    Dg Chest 1 View  Result Date: 08/25/2019 CLINICAL DATA:  Seizure. EXAM: CHEST  1 VIEW COMPARISON:  Ten days ago FINDINGS: Normal heart size. Left and right coronary circulation stents. Normal aortic and hilar contours. Right apical bleb. There is no edema, consolidation, effusion, or pneumothorax. Remote bilateral rib fractures. IMPRESSION: No evidence of acute disease. Electronically Signed   By: Monte Fantasia M.D.   On: 08/25/2019 04:23   Ct Head Wo Contrast  Result Date: 08/25/2019 CLINICAL DATA:  Initial evaluation for acute head trauma. EXAM: CT HEAD WITHOUT CONTRAST TECHNIQUE: Contiguous axial images were obtained from the base of the skull through the vertex without intravenous contrast. COMPARISON:  Prior MRI from 07/17/2007. FINDINGS: Brain: Examination mildly degraded by motion artifact. Mild age-related cerebral atrophy with chronic small vessel ischemic disease. No acute intracranial hemorrhage. No acute large vessel territory infarct. No mass lesion, midline shift or mass effect. No hydrocephalus. No extra-axial fluid collection. Vascular: No hyperdense vessel. Scattered vascular calcifications  noted within the carotid siphons. Skull: Scalp soft tissues demonstrate no acute finding. Prior right craniotomy. No calvarial fracture. Sinuses/Orbits: Globes and orbital soft tissues within normal limits. Paranasal sinuses mastoid air cells are clear. Other: None. IMPRESSION: 1. No acute intracranial abnormality. 2. Mild age-related cerebral atrophy with chronic small vessel ischemic disease. 3. Prior right craniotomy. Electronically Signed   By: Jeannine Boga M.D.   On: 08/25/2019 04:58    Review of Systems  Constitutional: Negative for chills and fever.  HENT: Negative for sore throat and tinnitus.   Eyes: Negative for blurred vision and redness.  Respiratory: Negative for cough and shortness of breath.   Cardiovascular: Positive for chest pain. Negative for palpitations, orthopnea and PND.  Gastrointestinal: Negative for abdominal pain, diarrhea, nausea and vomiting.  Genitourinary: Negative for dysuria, frequency and urgency.  Musculoskeletal: Negative for joint pain and myalgias.  Skin: Negative for rash.       No lesions  Neurological: Positive for seizures. Negative for speech change, focal weakness and weakness.  Endo/Heme/Allergies: Does not bruise/bleed easily.       No temperature intolerance  Psychiatric/Behavioral: Positive for substance abuse and suicidal ideas. Negative for depression.    Blood pressure 120/87, pulse 89, temperature 98.4 F (36.9 C), temperature source Oral, resp. rate (!) 21, height 5\' 7"  (1.702 m), weight 54.4  kg, SpO2 100 %. Physical Exam  Vitals reviewed. Constitutional: He is oriented to person, place, and time. He appears well-developed and well-nourished. No distress.  HENT:  Head: Normocephalic and atraumatic.  Mouth/Throat: Oropharynx is clear and moist.  Eyes: Pupils are equal, round, and reactive to light. Conjunctivae and EOM are normal. No scleral icterus.  Neck: Normal range of motion. Neck supple. No JVD present. No tracheal  deviation present. No thyromegaly present.  Cardiovascular: Normal rate, regular rhythm and normal heart sounds. Exam reveals no gallop and no friction rub.  No murmur heard. Respiratory: Effort normal and breath sounds normal. No respiratory distress. He has no wheezes.  GI: Soft. Bowel sounds are normal. He exhibits no distension. There is no abdominal tenderness.  Genitourinary:    Genitourinary Comments: Deferred   Musculoskeletal: Normal range of motion.        General: No edema.  Lymphadenopathy:    He has no cervical adenopathy.  Neurological: He is alert and oriented to person, place, and time. No cranial nerve deficit.  Skin: Skin is warm and dry. No rash noted. No erythema.  Psychiatric: He has a normal mood and affect. His behavior is normal. Judgment and thought content normal.     Assessment/Plan This is a 58 year old male admitted for seizures. 1.  Seizure: Multifactorial; underlying seizure disorder following TBI, and chronic alcohol abuse.  I have given the patient IV Keppra and we will check his Depakote level prior to resuming home dose. 2.  Chest pain: Resolved; continue aspirin and Brilinta.  Continue ARB and carvedilol for hypertension management.  Monitor telemetry.  Consult cardiology 3.  Alcohol abuse: Severe; initiate CIWA scale.  Admit patient to stepdown unit for close observation and/or sedation if needed 4.  Suicidal ideation: Sitter at bedside due to involuntary commitment.  Consult psychiatry once patient is stable 5.  DVT prophylaxis: Lovenox 6.  GI prophylaxis: Pantoprazole per home regimen The patient is a full code.  I have personally spent 45 minutes in critical care time with this patient  Harrie Foreman, MD 08/25/2019, 5:39 AM

## 2019-08-26 ENCOUNTER — Other Ambulatory Visit: Payer: Self-pay

## 2019-08-26 ENCOUNTER — Inpatient Hospital Stay (HOSPITAL_COMMUNITY)
Admit: 2019-08-26 | Discharge: 2019-08-26 | Disposition: A | Discharge status: Home / Self Care | Payer: Medicaid Other | Attending: Physician Assistant | Admitting: Physician Assistant

## 2019-08-26 ENCOUNTER — Inpatient Hospital Stay
Admission: RE | Admit: 2019-08-26 | Discharge: 2019-08-28 | DRG: 885 | Disposition: A | Discharge status: Home / Self Care | Payer: Medicaid Other | Source: Intra-hospital | Attending: Psychiatry | Admitting: Psychiatry

## 2019-08-26 DIAGNOSIS — Z915 Personal history of self-harm: Secondary | ICD-10-CM | POA: Diagnosis not present

## 2019-08-26 DIAGNOSIS — I10 Essential (primary) hypertension: Secondary | ICD-10-CM

## 2019-08-26 DIAGNOSIS — R630 Anorexia: Secondary | ICD-10-CM | POA: Diagnosis present

## 2019-08-26 DIAGNOSIS — F17210 Nicotine dependence, cigarettes, uncomplicated: Secondary | ICD-10-CM | POA: Diagnosis present

## 2019-08-26 DIAGNOSIS — R079 Chest pain, unspecified: Secondary | ICD-10-CM | POA: Diagnosis not present

## 2019-08-26 DIAGNOSIS — K219 Gastro-esophageal reflux disease without esophagitis: Secondary | ICD-10-CM | POA: Diagnosis present

## 2019-08-26 DIAGNOSIS — I25119 Atherosclerotic heart disease of native coronary artery with unspecified angina pectoris: Secondary | ICD-10-CM

## 2019-08-26 DIAGNOSIS — F3130 Bipolar disorder, current episode depressed, mild or moderate severity, unspecified: Secondary | ICD-10-CM | POA: Diagnosis present

## 2019-08-26 DIAGNOSIS — R45851 Suicidal ideations: Secondary | ICD-10-CM | POA: Diagnosis present

## 2019-08-26 DIAGNOSIS — F314 Bipolar disorder, current episode depressed, severe, without psychotic features: Secondary | ICD-10-CM | POA: Diagnosis present

## 2019-08-26 LAB — ECHOCARDIOGRAM COMPLETE
Height: 67 in
Weight: 2164.8 oz

## 2019-08-26 MED ORDER — FOLIC ACID 1 MG PO TABS
1.0000 mg | ORAL_TABLET | Freq: Every day | ORAL | Status: DC
  Administered 2019-08-27 – 2019-08-28 (×2): 1 mg via ORAL
  Filled 2019-08-26 (×2): qty 1

## 2019-08-26 MED ORDER — LITHIUM CARBONATE ER 450 MG PO TBCR
450.0000 mg | EXTENDED_RELEASE_TABLET | Freq: Two times a day (BID) | ORAL | Status: DC
  Administered 2019-08-26 – 2019-08-28 (×4): 450 mg via ORAL
  Filled 2019-08-26 (×5): qty 1

## 2019-08-26 MED ORDER — IRBESARTAN 150 MG PO TABS
150.0000 mg | ORAL_TABLET | Freq: Every day | ORAL | Status: DC
  Administered 2019-08-27 – 2019-08-28 (×2): 150 mg via ORAL
  Filled 2019-08-26 (×2): qty 1

## 2019-08-26 MED ORDER — ACETAMINOPHEN 650 MG RE SUPP
650.0000 mg | Freq: Four times a day (QID) | RECTAL | Status: DC | PRN
  Filled 2019-08-26: qty 1

## 2019-08-26 MED ORDER — ASPIRIN EC 81 MG PO TBEC
81.0000 mg | DELAYED_RELEASE_TABLET | Freq: Every day | ORAL | Status: DC
  Administered 2019-08-27 – 2019-08-28 (×2): 81 mg via ORAL
  Filled 2019-08-26 (×2): qty 1

## 2019-08-26 MED ORDER — LORAZEPAM 2 MG/ML IJ SOLN: 1.0000 mg | INTRAMUSCULAR | Status: AC | PRN

## 2019-08-26 MED ORDER — PANTOPRAZOLE SODIUM 40 MG PO TBEC
40.0000 mg | DELAYED_RELEASE_TABLET | Freq: Every day | ORAL | Status: DC
  Administered 2019-08-27: 40 mg via ORAL
  Filled 2019-08-26: qty 1

## 2019-08-26 MED ORDER — K PHOS MONO-SOD PHOS DI & MONO 155-852-130 MG PO TABS
500.0000 mg | ORAL_TABLET | ORAL | Status: DC
  Administered 2019-08-26: 500 mg via ORAL
  Filled 2019-08-26 (×4): qty 2

## 2019-08-26 MED ORDER — ONDANSETRON HCL 4 MG PO TABS: 4.0000 mg | ORAL_TABLET | Freq: Four times a day (QID) | ORAL | Status: DC | PRN

## 2019-08-26 MED ORDER — LORAZEPAM 1 MG PO TABS
1.0000 mg | ORAL_TABLET | ORAL | Status: AC | PRN
  Administered 2019-08-26: 2 mg via ORAL
  Administered 2019-08-27 (×2): 1 mg via ORAL
  Filled 2019-08-26: qty 1
  Filled 2019-08-26: qty 2
  Filled 2019-08-26: qty 1

## 2019-08-26 MED ORDER — ACETAMINOPHEN 325 MG PO TABS: 650.0000 mg | ORAL_TABLET | Freq: Four times a day (QID) | ORAL | Status: DC | PRN

## 2019-08-26 MED ORDER — DIVALPROEX SODIUM ER 500 MG PO TB24
1000.0000 mg | ORAL_TABLET | Freq: Every day | ORAL | Status: DC
  Administered 2019-08-26 – 2019-08-27 (×2): 1000 mg via ORAL
  Filled 2019-08-26 (×2): qty 2

## 2019-08-26 MED ORDER — K PHOS MONO-SOD PHOS DI & MONO 155-852-130 MG PO TABS: 500.0000 mg | ORAL_TABLET | ORAL | Status: DC

## 2019-08-26 MED ORDER — DOCUSATE SODIUM 100 MG PO CAPS
100.0000 mg | ORAL_CAPSULE | Freq: Two times a day (BID) | ORAL | Status: DC
  Administered 2019-08-26 – 2019-08-28 (×4): 100 mg via ORAL
  Filled 2019-08-26 (×4): qty 1

## 2019-08-26 MED ORDER — ENOXAPARIN SODIUM 40 MG/0.4ML ~~LOC~~ SOLN
40.0000 mg | SUBCUTANEOUS | Status: DC
  Administered 2019-08-26: 23:00:00 40 mg via SUBCUTANEOUS
  Filled 2019-08-26 (×2): qty 0.4

## 2019-08-26 MED ORDER — THIAMINE HCL 100 MG/ML IJ SOLN: 100.0000 mg | Freq: Every day | INTRAMUSCULAR | Status: DC

## 2019-08-26 MED ORDER — NITROGLYCERIN 0.4 MG SL SUBL: 0.4000 mg | SUBLINGUAL_TABLET | SUBLINGUAL | Status: DC | PRN

## 2019-08-26 MED ORDER — CARIPRAZINE HCL 3 MG PO CAPS
4.5000 mg | ORAL_CAPSULE | ORAL | Status: DC
  Administered 2019-08-27 (×2): 4.5 mg via ORAL
  Filled 2019-08-26 (×3): qty 1

## 2019-08-26 MED ORDER — VITAMIN B-1 100 MG PO TABS
100.0000 mg | ORAL_TABLET | Freq: Every day | ORAL | Status: DC
  Administered 2019-08-27 – 2019-08-28 (×2): 100 mg via ORAL
  Filled 2019-08-26 (×2): qty 1

## 2019-08-26 MED ORDER — K PHOS MONO-SOD PHOS DI & MONO 155-852-130 MG PO TABS
500.0000 mg | ORAL_TABLET | ORAL | Status: AC
  Administered 2019-08-26 (×2): 500 mg via ORAL
  Filled 2019-08-26 (×3): qty 2

## 2019-08-26 MED ORDER — ATORVASTATIN CALCIUM 20 MG PO TABS
40.0000 mg | ORAL_TABLET | Freq: Every day | ORAL | Status: DC
  Administered 2019-08-26 – 2019-08-27 (×2): 40 mg via ORAL
  Filled 2019-08-26 (×2): qty 2

## 2019-08-26 MED ORDER — MAGNESIUM HYDROXIDE 400 MG/5ML PO SUSP: 30.0000 mL | Freq: Every day | ORAL | Status: DC | PRN

## 2019-08-26 MED ORDER — TICAGRELOR 60 MG PO TABS
60.0000 mg | ORAL_TABLET | Freq: Two times a day (BID) | ORAL | Status: DC
  Administered 2019-08-26 – 2019-08-28 (×4): 60 mg via ORAL
  Filled 2019-08-26 (×6): qty 1

## 2019-08-26 MED ORDER — CARVEDILOL 6.25 MG PO TABS
3.1250 mg | ORAL_TABLET | Freq: Two times a day (BID) | ORAL | Status: DC
  Administered 2019-08-26 – 2019-08-28 (×3): 3.125 mg via ORAL
  Filled 2019-08-26 (×4): qty 1

## 2019-08-26 MED ORDER — ADULT MULTIVITAMIN W/MINERALS CH
1.0000 | ORAL_TABLET | Freq: Every day | ORAL | Status: DC
  Administered 2019-08-27 – 2019-08-28 (×2): 1 via ORAL
  Filled 2019-08-26 (×2): qty 1

## 2019-08-26 MED ORDER — ONDANSETRON HCL 4 MG/2ML IJ SOLN
4.0000 mg | Freq: Four times a day (QID) | INTRAMUSCULAR | Status: DC | PRN
  Filled 2019-08-26: qty 2

## 2019-08-26 MED ORDER — ALUM & MAG HYDROXIDE-SIMETH 200-200-20 MG/5ML PO SUSP: 30.0000 mL | ORAL | Status: DC | PRN
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
## 2019-08-26 NOTE — Progress Notes (Signed)
Pt being d/c to inpatient psych unit. Pt has clothes and shoes that was sent with security who escorted downstairs. Pt reports having cell phone downstairs in ED, this was not documented nor found in belonging bag.  Charge nurse made aware of belongings. IVC paperwork sent with security downstairs.

## 2019-08-26 NOTE — Consult Note (Signed)
Patient to be admitted to inpatient psychaitry

## 2019-08-26 NOTE — Consult Note (Signed)
  Psychiatry: Brief follow-up consult.  I received a text message from the hospitalist so I went up to see the patient myself.  58 year old man with alcohol abuse and depression.  Had a seizure a couple days ago and is still tremulous but is lucid not having hallucinations does not appear delirious.  Has been able to get up and ambulate without feeling faint or falling.  Patient is appropriate for transfer to psychiatry.  Spoke with nursing and TTS.  I will put in discharge readmit orders.

## 2019-08-26 NOTE — Discharge Summary (Signed)
[facility] NAME: [name]    MR#:  [id number]  DATE OF BIRTH:  03-30-1961  DATE OF ADMISSION:  08/25/2019 ADMITTING PHYSICIAN: Saundra Shelling, MD  DATE OF DISCHARGE: 08/26/2019  PRIMARY CARE PHYSICIAN: Gayland Curry, MD   ADMISSION DIAGNOSIS:  Suicidal ideation [R45.851] Chest pain [R07.9] Alcohol withdrawal syndrome with complication ([place]) [id number] Alcohol withdrawal seizure with complication ([place]) 0000000, R56.9] Seizure ([place]) [R56.9]  DISCHARGE DIAGNOSIS:  Principal Problem:   Alcohol dependence with withdrawal with complication ([place]) Active Problems:   Seizure ([place])   Bipolar affective disorder, depressed, severe ([place])   Chest pain Suicidal ideation Depression  SECONDARY DIAGNOSIS:   Past Medical History:  Diagnosis Date  . Anxiety   . Bipolar affective ([place])    takes Lithium daily  . Broken fingers   . Coronary artery disease    a. 1997 PCI: RCA ([facility]); b. 2008 PCI: RCA; c. 05/2012 Cath: LCx 100 (previously stented - ? date), RCA stent w/ mild ISR, LAD nonobs, EF 35%; d. 06/2013 Cath: stable anatomy; e. 03/2018 Inf STEMI/PCI: LCX 100o/p ISR (CTO), RCA 170m/d (3.0x38 Resolute Onyx DES), EF 40-45%.   . Depression   . GERD (gastroesophageal reflux disease)    takes Protonix daily  . Hepatitis C    "diagnosed in the past 2 wk" (10/22/2012)  . History of ETOH abuse    quit 2007  . History of radiation therapy 12/17/17- 01/24/18   Larynx treated to 63 gy in 28 fx of 2.25 Gy  . Hyperlipidemia   . Hypertension    "pt. denies - no longer taking blood pressure medications"  . Ischemic cardiomyopathy    a. 03/2018 LV gram: EF 40-45% @ time of MI.  Marland Kitchen Myocardial infarction ([place])    x 4  . PAD (peripheral artery disease) ([place])    a. 2010 PTA & stent to left CIA & left SFA; b. 05/2012 PTA & stent to distal LCIA; c. 10/2012 thrombectomy and stents to [place]; d. 11/2014 s/p Ao-Bifem bypass.  . Schizophrenia  ([place])   . Seizures ([place])    "when I get anxious" (Stated last seizure three months ago)  . Tobacco abuse   . Urinary frequency      ADMITTING HISTORY The patient with past medical history of coronary artery disease status post PCI, hypertension, hyperlipidemia, alcohol abuse and depression presents to the emergency department due to suicidal ideation.  The patient admits to being very depressed since the death of his mother 4 months ago and he also admits to drinking a gallon of liquor a day in addition to up to 20 beers.  His last drink was approximately an hour prior to arrival.  The patient had a witnessed seizure in the waiting room.  Ativan 2 mg IV was administered which stopped seizure activity.  The patient admits to having a seizure earlier today or yesterday as well.  Notably, the patient also reports chest pain at some point today, although he admits that it is not like pain he has had with previous myocardial infarction's.  Initial troponin was mildly elevated at 19.  EKG did not show signs of ischemia.  Once the patient was stabilized the emergency department staff called the hospitalist service for admission.  HOSPITAL COURSE:  Patient was admitted to telemetry and monitored.  Serial troponins were borderline.  Cardiology evaluated the patient and the patient was started on all cardiac meds no acute intervention recommended.  Psychiatry  evaluated the patient patient was one-on-one observation for safety and psychiatric medications were started.  Patient has suicidal ideation and depression and has been accepted by behavioral unit for further management.  Patient medically cleared will be discharged to inpatient behavioral unit for further management of his psychiatric issues.  He received CIWA protocol during the stay on telemetry for alcohol abuse to prevent withdrawal.  CONSULTS OBTAINED:  Treatment Team:  Wellington Hampshire, MD  DRUG ALLERGIES:   Allergies  Allergen Reactions  .  Lisinopril Other (See Comments)    Other reaction(s): Elevated creatine and very elevated potassium     DISCHARGE MEDICATIONS:   Allergies as of 08/26/2019      Reactions   Lisinopril Other (See Comments)   Other reaction(s): Elevated creatine and very elevated potassium      Medication List    TAKE these medications   aspirin 81 MG chewable tablet Chew 1 tablet (81 mg total) by mouth daily.   atorvastatin 40 MG tablet Commonly known as: LIPITOR Take 1 tablet (40 mg total) by mouth daily at 6 PM.   carvedilol 3.125 MG tablet Commonly known as: COREG Take 3.125 mg by mouth 2 (two) times daily with a meal.   divalproex 500 MG 24 hr tablet Commonly known as: DEPAKOTE ER Take 1,000 mg by mouth at bedtime.   lithium carbonate 450 MG CR tablet Commonly known as: ESKALITH Take 450 mg by mouth 2 (two) times daily.   pantoprazole 40 MG tablet Commonly known as: PROTONIX Take 40 mg by mouth daily. In the afternoon.   phosphorus 155-852-130 MG tablet Commonly known as: K PHOS NEUTRAL Take 2 tablets (500 mg total) by mouth every 4 (four) hours for 1 day.   QUEtiapine 50 MG tablet Commonly known as: SEROQUEL Take 25 mg by mouth every 4 (four) hours as needed for agitation.   telmisartan 40 MG tablet Commonly known as: MICARDIS Take 1 tablet (40 mg total) by mouth daily.   ticagrelor 60 MG Tabs tablet Commonly known as: Brilinta Take 1 tablet (60 mg total) by mouth 2 (two) times daily.   VRAYLAR PO Take 4.5 mg by mouth at bedtime. (per calling [facility] in [place] 08/25/2019)       Today  Patient seen and evaluated today No chest pain No shortness of breath Hemodynamically stable VITAL SIGNS:  Blood pressure (!) 149/76, pulse 63, temperature 97.8 F (36.6 C), temperature source Oral, resp. rate 18, height 5\' 7"  (1.702 m), weight 61.4 kg, SpO2 98 %.  I/O:    Intake/Output Summary (Last 24 hours) at 08/26/2019 1329 Last data filed at 08/26/2019 0945 Gross  per 24 hour  Intake 2405.77 ml  Output 500 ml  Net 1905.77 ml    PHYSICAL EXAMINATION:  Physical Exam  GENERAL:  58 y.o.-year-old patient lying in the bed with no acute distress.  LUNGS: Normal breath sounds bilaterally, no wheezing, rales,rhonchi or crepitation. No use of accessory muscles of respiration.  CARDIOVASCULAR: S1, S2 normal. No murmurs, rubs, or gallops.  ABDOMEN: Soft, non-tender, non-distended. Bowel sounds present. No organomegaly or mass.  NEUROLOGIC: Moves all 4 extremities. PSYCHIATRIC: The patient is alert and oriented x 3.  SKIN: No obvious rash, lesion, or ulcer.   DATA REVIEW:   CBC Recent Labs  Lab 08/25/19 0400  WBC 5.0  HGB 15.3  HCT 44.6  PLT 148*    Chemistries  Recent Labs  Lab 08/25/19 0400 08/25/19 0643  NA 142  --   K  4.1  --   CL 106  --   CO2 23  --   GLUCOSE 108*  --   BUN 9  --   CREATININE 0.66  --   CALCIUM 9.1  --   MG  --  2.1  AST 34  --   ALT 29  --   ALKPHOS 43  --   BILITOT 0.7  --     Cardiac Enzymes No results for input(s): TROPONINI in the last 168 hours.  Microbiology Results  Results for orders placed or performed during the hospital encounter of 08/25/19  SARS Coronavirus 2 [facility] order, Performed in [facility] hospital lab) Nasopharyngeal Nasopharyngeal Swab     Status: None   Collection Time: 08/25/19  5:11 AM   Specimen: Nasopharyngeal Swab  Result Value Ref Range Status   SARS Coronavirus 2 NEGATIVE NEGATIVE Final    Comment: (NOTE) If result is NEGATIVE SARS-CoV-2 target nucleic acids are NOT DETECTED. The SARS-CoV-2 RNA is generally detectable in upper and lower  respiratory specimens during the acute phase of infection. The lowest  concentration of SARS-CoV-2 viral copies this assay can detect is 250  copies / mL. A negative result does not preclude SARS-CoV-2 infection  and should not be used as the sole basis for treatment or other  patient management decisions.  A negative result may  occur with  improper specimen collection / handling, submission of specimen other  than nasopharyngeal swab, presence of viral mutation(s) within the  areas targeted by this assay, and inadequate number of viral copies  (<250 copies / mL). A negative result must be combined with clinical  observations, patient history, and epidemiological information. If result is POSITIVE SARS-CoV-2 target nucleic acids are DETECTED. The SARS-CoV-2 RNA is generally detectable in upper and lower  respiratory specimens dur ing the acute phase of infection.  Positive  results are indicative of active infection with SARS-CoV-2.  Clinical  correlation with patient history and other diagnostic information is  necessary to determine patient infection status.  Positive results do  not rule out bacterial infection or co-infection with other viruses. If result is PRESUMPTIVE POSTIVE SARS-CoV-2 nucleic acids MAY BE PRESENT.   A presumptive positive result was obtained on the submitted specimen  and confirmed on repeat testing.  While 2019 novel coronavirus  (SARS-CoV-2) nucleic acids may be present in the submitted sample  additional confirmatory testing may be necessary for epidemiological  and / or clinical management purposes  to differentiate between  SARS-CoV-2 and other Sarbecovirus currently known to infect humans.  If clinically indicated additional testing with an alternate test  methodology [id number]) is advised. The SARS-CoV-2 RNA is generally  detectable in upper and lower respiratory sp ecimens during the acute  phase of infection. The expected result is Negative. Fact Sheet for Patients:  StrictlyIdeas.no Fact Sheet for Healthcare Providers: BankingDealers.co.za This test is not yet approved or cleared by the [place] FDA and has been authorized for detection and/or diagnosis of SARS-CoV-2 by FDA under an Emergency Use Authorization (EUA).  This  EUA will remain in effect (meaning this test can be used) for the duration of the COVID-19 declaration under Section 564(b)(1) of the Act, 21 U.S.C. section 360bbb-3(b)(1), unless the authorization is terminated or revoked sooner. Performed at [facility], [address], [place], [place] [address]     RADIOLOGY:  Dg Chest 1 View  Result Date: 08/25/2019 CLINICAL DATA:  Seizure. EXAM: CHEST  1 VIEW COMPARISON:  Ten days  ago FINDINGS: Normal heart size. Left and right coronary circulation stents. Normal aortic and hilar contours. Right apical bleb. There is no edema, consolidation, effusion, or pneumothorax. Remote bilateral rib fractures. IMPRESSION: No evidence of acute disease. Electronically Signed   By: Monte Fantasia M.D.   On: 08/25/2019 04:23   Ct Head Wo Contrast  Result Date: 08/25/2019 CLINICAL DATA:  Initial evaluation for acute head trauma. EXAM: CT HEAD WITHOUT CONTRAST TECHNIQUE: Contiguous axial images were obtained from the base of the skull through the vertex without intravenous contrast. COMPARISON:  Prior MRI from 07/17/2007. FINDINGS: Brain: Examination mildly degraded by motion artifact. Mild age-related cerebral atrophy with chronic small vessel ischemic disease. No acute intracranial hemorrhage. No acute large vessel territory infarct. No mass lesion, midline shift or mass effect. No hydrocephalus. No extra-axial fluid collection. Vascular: No hyperdense vessel. Scattered vascular calcifications noted within the carotid siphons. Skull: Scalp soft tissues demonstrate no acute finding. Prior right craniotomy. No calvarial fracture. Sinuses/Orbits: Globes and orbital soft tissues within normal limits. Paranasal sinuses mastoid air cells are clear. Other: None. IMPRESSION: 1. No acute intracranial abnormality. 2. Mild age-related cerebral atrophy with chronic small vessel ischemic disease. 3. Prior right craniotomy. Electronically Signed   By: Jeannine Boga M.D.    On: 08/25/2019 04:58    Follow up with PCP in 1 week.  Management plans discussed with the patient, family and they are in agreement.  CODE STATUS: Full code    Code Status Orders  (From admission, onward)         Start     Ordered   08/25/19 1049  Full code  Continuous     08/25/19 1048        Code Status History    Date Active Date Inactive Code Status Order ID Comments User Context   03/07/2018 1530 03/09/2018 1728 Full Code [id number]  Isaias Cowman, MD Inpatient   06/29/2017 1803 06/30/2017 1534 Full Code [id number]  Robert Bellow, MD Inpatient   01/26/2017 1411 01/27/2017 1305 Full Code [id number]  Ashok Pall, MD Inpatient   11/08/2015 1820 11/14/2015 1352 Full Code [id number]  Rolm Bookbinder, MD Inpatient   11/20/2014 1354 12/01/2014 1552 Full Code [id number]  Gabriel Earing, PA-C Inpatient   11/11/2014 1534 11/11/2014 2259 Full Code [id number]  Wellington Hampshire, MD Inpatient   Advance Care Planning Activity      TOTAL TIME TAKING CARE OF THIS PATIENT ON DAY OF DISCHARGE: more than 35 minutes.   Saundra Shelling M.D on 08/26/2019 at 1:29 PM  Between 7am to 6pm - Pager - [phone]  After 6pm go to [email or web address] - password [email or web address] [facility]  Office  [phone]  CC: Primary care physician; Gayland Curry, MD  Note: This dictation was prepared with Dragon dictation along with smaller phrase technology. Any transcriptional errors that result from this process are unintentional.

## 2019-08-26 NOTE — Progress Notes (Signed)
*  PRELIMINARY RESULTS* Echocardiogram 2D Echocardiogram has been performed.  Osei, Gorsky 08/26/2019, 1:36 PM

## 2019-08-26 NOTE — Progress Notes (Signed)
Advanced care plan. Purpose of the Encounter: CODE STATUS Parties in Attendance:Patient Patient's Decision Capacity:OK Subjective/Patient's story: The patient with past medical history of coronary artery disease status post PCI, hypertension, hyperlipidemia, alcohol abuse and depression presents to the emergency department due to suicidal ideation.  The patient admits to being very depressed since the death of his mother 4 months ago and he also admits to drinking a gallon of liquor a day in addition to up to 20 beers.  His last drink was approximately an hour prior to arrival.  The patient had a witnessed seizure in the waiting room.  Ativan 2 mg IV was administered which stopped seizure activity.  The patient admits to having a seizure earlier today or yesterday as well.  Notably, the patient also reports chest pain at some point today, although he admits that it is not like pain he has had with previous myocardial infarction's.  Initial troponin was mildly elevated at 19.  EKG did not show signs of ischemia.  Once the patient was stabilized the emergency department staff called the hospitalist service for admission. Objective/Medical story Patient needs work up for chest pain Needs cardiology evaluation Goals of care determination:  Advance care directives discussed Patient wants everything done which includes cpr, intubation and ventilator if need arises. CODE STATUS: Full code Time spent discussing advanced care planning: 16 minutes

## 2019-08-26 NOTE — Progress Notes (Signed)
Progress Note  Patient Name: [name] Date of Encounter: 08/26/2019  Primary Cardiologist: Kathlyn Sacramento, MD   Subjective   Denies CP, palpitations, and racing HR. No reported SOB. Eager for discharge with notes indicating will be admitted to psychiatry today.  Inpatient Medications    Scheduled Meds: . aspirin EC  81 mg Oral Daily  . atorvastatin  40 mg Oral q1800  . cariprazine  4.5 mg Oral Q24H  . carvedilol  3.125 mg Oral BID WC  . divalproex  1,000 mg Oral QHS  . docusate sodium  100 mg Oral BID  . enoxaparin (LOVENOX) injection  40 mg Subcutaneous Q24H  . folic acid  1 mg Oral Daily  . irbesartan  150 mg Oral Daily  . lithium carbonate  450 mg Oral Q12H  . LORazepam  0-4 mg Intravenous Q4H   Followed by  . [START ON 08/27/2019] LORazepam  0-4 mg Intravenous Q8H  . multivitamin with minerals  1 tablet Oral Daily  . pantoprazole  40 mg Oral Q1200  . phosphorus  500 mg Oral Q4H  . thiamine  100 mg Oral Daily   Or  . thiamine  100 mg Intravenous Daily  . ticagrelor  60 mg Oral BID   Continuous Infusions: . sodium chloride 125 mL/hr at 08/26/19 0543   PRN Meds: acetaminophen **OR** acetaminophen, LORazepam **OR** LORazepam, nitroGLYCERIN, ondansetron **OR** ondansetron (ZOFRAN) IV   Vital Signs    Vitals:   08/26/19 0433 08/26/19 0530 08/26/19 1018 08/26/19 1555  BP: 127/69  (!) 149/76 (!) 176/95  Pulse: (!) 51  63 62  Resp: 18  18 18   Temp: 97.8 F (36.6 C)     TempSrc: Oral     SpO2: 100%  98% 97%  Weight:  61.4 kg    Height:        Intake/Output Summary (Last 24 hours) at 08/26/2019 1556 Last data filed at 08/26/2019 1300 Gross per 24 hour  Intake 2645.77 ml  Output 500 ml  Net 2145.77 ml   Last 3 Weights 08/26/2019 08/25/2019 08/25/2019  Weight (lbs) 135 lb 4.8 oz 133 lb 12.8 oz 119 lb 14.9 oz  Weight (kg) 61.372 kg 60.691 kg 54.4 kg      Telemetry    SR with frequent PVCs and trigeminy. 5 beat NSVT at 12:21 PM.- Personally Reviewed   ECG    No new tracings - Personally Reviewed  Physical Exam   GEN: No acute distress. Thin male, appears older than stated age.  Remains in bed with a sitter. Neck: No JVD Cardiac:  R RRR with extrasystole appreciated, no murmurs, rubs, or gallops.  Respiratory: Clear to auscultation bilaterally; scattered rhonchi that cleared with cough. GI: Soft, nontender, non-distended  MS: No edema; No deformity. Neuro:  Nonfocal  Psych: Normal affect   Labs    High Sensitivity Troponin:   Recent Labs  Lab 08/15/19 2216 08/16/19 0158 08/25/19 0400 08/25/19 0643  TROPONINIHS 14 15 19* 18*      Cardiac EnzymesNo results for input(s): TROPONINI in the last 168 hours. No results for input(s): TROPIPOC in the last 168 hours.   Chemistry Recent Labs  Lab 08/25/19 0400  NA 142  K 4.1  CL 106  CO2 23  GLUCOSE 108*  BUN 9  CREATININE 0.66  CALCIUM 9.1  PROT 7.8  ALBUMIN 4.8  AST 34  ALT 29  ALKPHOS 43  BILITOT 0.7  GFRNONAA >60  GFRAA >60  ANIONGAP 13  Hematology Recent Labs  Lab 08/25/19 0400  WBC 5.0  RBC 4.59  HGB 15.3  HCT 44.6  MCV 97.2  MCH 33.3  MCHC 34.3  RDW 12.5  PLT 148*    BNPNo results for input(s): BNP, PROBNP in the last 168 hours.   DDimer No results for input(s): DDIMER in the last 168 hours.   Radiology    Dg Chest 1 View  Result Date: 08/25/2019 CLINICAL DATA:  Seizure. EXAM: CHEST  1 VIEW COMPARISON:  Ten days ago FINDINGS: Normal heart size. Left and right coronary circulation stents. Normal aortic and hilar contours. Right apical bleb. There is no edema, consolidation, effusion, or pneumothorax. Remote bilateral rib fractures. IMPRESSION: No evidence of acute disease. Electronically Signed   By: Monte Fantasia M.D.   On: 08/25/2019 04:23   Ct Head Wo Contrast  Result Date: 08/25/2019 CLINICAL DATA:  Initial evaluation for acute head trauma. EXAM: CT HEAD WITHOUT CONTRAST TECHNIQUE: Contiguous axial images were obtained from the base  of the skull through the vertex without intravenous contrast. COMPARISON:  Prior MRI from 07/17/2007. FINDINGS: Brain: Examination mildly degraded by motion artifact. Mild age-related cerebral atrophy with chronic small vessel ischemic disease. No acute intracranial hemorrhage. No acute large vessel territory infarct. No mass lesion, midline shift or mass effect. No hydrocephalus. No extra-axial fluid collection. Vascular: No hyperdense vessel. Scattered vascular calcifications noted within the carotid siphons. Skull: Scalp soft tissues demonstrate no acute finding. Prior right craniotomy. No calvarial fracture. Sinuses/Orbits: Globes and orbital soft tissues within normal limits. Paranasal sinuses mastoid air cells are clear. Other: None. IMPRESSION: 1. No acute intracranial abnormality. 2. Mild age-related cerebral atrophy with chronic small vessel ischemic disease. 3. Prior right craniotomy. Electronically Signed   By: Jeannine Boga M.D.   On: 08/25/2019 04:58    Cardiac Studies   Echo 08/26/2019  1. Left ventricular ejection fraction, by visual estimation, is 55 to 60%. The left ventricle has normal function. Normal left ventricular size. There is mildly increased left ventricular hypertrophy.  2. Left ventricular diastolic Doppler parameters are consistent with impaired relaxation pattern of LV diastolic filling.  3. Global right ventricle has normal systolic function.The right ventricular size is normal. No increase in right ventricular wall thickness.  4. Left atrial size was normal.  5. Right atrial size was normal.  6. The mitral valve is normal in structure. No evidence of mitral valve regurgitation.  7. The tricuspid valve is grossly normal. Tricuspid valve regurgitation is trivial.  8. The aortic valve was not well visualized Aortic valve regurgitation was not visualized by color flow Doppler. Structurally normal aortic valve, with no evidence of sclerosis or stenosis.  9. The  pulmonic valve was not well visualized. Pulmonic valve regurgitation is not visualized by color flow Doppler. 10. Normal pulmonary artery systolic pressure. 11. The interatrial septum was not well visualized.  Patient Profile     58 y.o. male with a hx of CAD s/p PCI, HFrEF secondary to ICM, PAD s/p prior bilateral iliac stenting, more recent arterial bifemoral bypass 11/2014 by Dr. Kellie Simmering followed by PVS, mild to moderate left-sided renal artery stenosis, hepatitis C, ongoing tobacco and alcohol abuse, bipolar disorder, laryngeal cancer, hypertension, and hyperlipidemia who is being seen today for the evaluation of atypical CP with mildly elevated troponin s/p admission for seizures after known alcohol use.  Assessment & Plan    Atypical chest pain Elevated HS Tn, supply demand ischemia H/o CAD with stable angina --No further CP or  associated sx. He reported atypical L sided and sharp chest pain after having a seizure prior to admission.  --He has a history of CAD s/p PCI and DES to RCA with recent inferior STEMI 03/2018 and ongoing history of chronic chest pain since that time. Symptoms occur at rest and with exertion.  --HS Tn peaked at 19, minimally elevated and flat trending, not consistent with ACS and more consistent with supply demand ischemia. EKG without acute ST/T changes.  EKG without acute ST/T changes. TSH within normal limits and assessed due to ectopy on telemetry. --Echo updated as above with EF 55 to 60%, mild LVH, LV diastolic impaired relaxation, and remainder of details as above.   --Continue medical management with DAPT (ASA and reduced dose Brilinta 60mg ), ARB, BB, statin, and PRN SL nitro.  On admission, reported h/o bright red blood in toilet and hemoptysis; however, no recent occurrences of either. CBC with normal Hgb. Suspect GI etiology likely 2/2 ongoing alcohol abuse / chronic diarrhea. Consider outpatient GI workup. Continue PPI. Daily BMET with no recent labs from  today. --Previously noted that patient could benefit from outpatient stress testing; however, he has declined this in the past.  Also, as previously noted, stress testing may not be helpful in this particular situation given the patient's previous inferior scar.  GERD / Gastritris --Likely 2/2 alcohol abuse. H/o bright red blood in toilet / hemoptysis but without recent recurrence and CBC normal though no CBC from today. Monitor for s/sx consistent with acute bleed on DAPT.  If this becomes an issue, anticoagulant can be stopped and aspirin continued.  However, he denies reoccurrence of symptoms today. Continue PPI.  HFrEF secondary to ICM --No further SOB.   Improved EF from previously noted EF of 40 to 45% to EF 50 to 60%.  Continue medical management as above.  Outpatient follow-up.   HTN --Continue medical management.   HLD --Most recent LDL wnl. Continue statin.   Alcohol/tobacco abuse --2 packs of cigarettes daily. Cessation advised. --1/5 liquor and 20 beers daily. Cessation advised.  Anxiety/Depression --Psychiatry consult with recommendation for psychiatry inpatient admission.  PAD --S/p bilateral iliac stenting and aortobifemoral bypass in 2015.  Continue office follow-up.  For questions or updates, please contact [place] Please consult [email or web address] for contact info under        Signed, Arvil Chaco, PA-C  08/26/2019, 3:56 PM

## 2019-08-26 NOTE — Tx Team (Signed)
Initial Treatment Plan 08/26/2019 5:39 PM [name] [id number]    PATIENT STRESSORS: Health problems Medication change or noncompliance Substance abuse   PATIENT STRENGTHS: Ability for insight Average or above average intelligence Capable of independent living Communication skills Supportive family/friends   PATIENT IDENTIFIED PROBLEMS: Substance Abuse 08/26/2019  Grieving 08/26/2019   Medical Issue Hepatis C                 DISCHARGE CRITERIA:  Ability to meet basic life and health needs Adequate post-discharge living arrangements Improved stabilization in mood, thinking, and/or behavior  PRELIMINARY DISCHARGE PLAN: Outpatient therapy Return to previous work or school arrangements  PATIENT/FAMILY INVOLVEMENT: This treatment plan has been presented to and reviewed with the patient, [name], and/or family member,.  The patient and family have been given the opportunity to ask questions and make suggestions.  Leodis Liverpool, RN 08/26/2019, 5:39 PM

## 2019-08-26 NOTE — BH Assessment (Signed)
Patient has been accepted to [facility].  Accepting physician is Dr. Weber Cooks.  Attending Physician will be Dr. Weber Cooks.  Patient has been assigned to room 302, by [place].  Call report to [phone].  Representative/Transfer Coordinator is Print production planner [facility] TTS) Patient pre-admitted by [facility] Patient Access Elberta Fortis)   Unit 2A Staff made aware of acceptance.

## 2019-08-26 NOTE — Plan of Care (Signed)
  Problem: Clinical Measurements: Goal: Respiratory complications will improve Outcome: Progressing   Problem: Nutrition: Goal: Adequate nutrition will be maintained Outcome: Progressing   Problem: Coping: Goal: Level of anxiety will decrease Outcome: Progressing   Problem: Pain Managment: Goal: General experience of comfort will improve Outcome: Progressing   Problem: Safety: Goal: Ability to remain free from injury will improve Outcome: Progressing   Problem: Nutrition: Goal: Adequate fluids and nutrition will be maintained Outcome: Progressing   Problem: Sleep Hygiene: Goal: Ability to obtain adequate restful sleep will improve Outcome: Progressing

## 2019-08-26 NOTE — Plan of Care (Signed)
  Problem: Education: Goal: Knowledge of [facility] Education information/materials will improve Outcome: Progressing  Instructed patient on [place] Education unit programing, able to verbalize understanding

## 2019-08-27 DIAGNOSIS — F314 Bipolar disorder, current episode depressed, severe, without psychotic features: Principal | ICD-10-CM

## 2019-08-27 NOTE — BHH Group Notes (Signed)
LCSW Group Therapy Note  08/27/2019 11:43 AM  Type of Therapy/Topic:  Group Therapy:  Emotion Regulation  Participation Level:  Did Not Attend   Description of Group:   The purpose of this group is to assist patients in learning to regulate negative emotions and experience positive emotions. Patients will be guided to discuss ways in which they have been vulnerable to their negative emotions. These vulnerabilities will be juxtaposed with experiences of positive emotions or situations, and patients will be challenged to use positive emotions to combat negative ones. Special emphasis will be placed on coping with negative emotions in conflict situations, and patients will process healthy conflict resolution skills.  Therapeutic Goals: 1. Patient will identify two positive emotions or experiences to reflect on in order to balance out negative emotions 2. Patient will label two or more emotions that they find the most difficult to experience 3. Patient will demonstrate positive conflict resolution skills through discussion and/or role plays  Summary of Patient Progress: x   Therapeutic Modalities:   Cognitive Behavioral Therapy Feelings Identification Dialectical Behavioral Therapy   Evalina Field, MSW, LCSW Clinical Social Work 08/27/2019 11:43 AM

## 2019-08-27 NOTE — Progress Notes (Addendum)
Patient alert and oriented x 4, affect is flat upon approach , he forwards very little, appears guarded,, his thoughts are organized and coherent, isolates to self minimal interaction with peers and staff. Patient appears disheveled , poor grooming with a body odor noted , patient was encouraged to take a bath but he refused and wasn't receptive no bizarre behavior noted, he was complaint with medication regimen, off CIWA no distress noted , on seizure precaution, no distress noted 15 minutes safety checks maintained will continue to monitor .

## 2019-08-27 NOTE — BHH Suicide Risk Assessment (Signed)
[place] INPATIENT:  Family/Significant Other Suicide Prevention Education  Suicide Prevention Education:  Education Completed; [name] [phone] has been identified by the patient as the family member/significant other with whom the patient will be residing, and identified as the person(s) who will aid the patient in the event of a mental health crisis (suicidal ideations/suicide attempt).  With written consent from the patient, the family member/significant other has been provided the following suicide prevention education, prior to the and/or following the discharge of the patient.  The suicide prevention education provided includes the following:  Suicide risk factors  Suicide prevention and interventions  National Suicide Hotline telephone number  [facility] assessment telephone number  [facility] Emergency Assistance [place] and/or Residential Mobile Crisis Unit telephone number  Request made of family/significant other to:  Remove weapons (e.g., guns, rifles, knives), all items previously/currently identified as safety concern.    Remove drugs/medications (over-the-counter, prescriptions, illicit drugs), all items previously/currently identified as a safety concern.  The family member/significant other verbalizes understanding of the suicide prevention education information provided.  The family member/significant other agrees to remove the items of safety concern listed above.  CSW spoke with pts roommate, [name] who reported that pt is in the hospital due to him drinking too much and his depression. Per [name], pt recently lost his mother and has been taking that pretty hard and drinking daily within the past month. [name] expressed no HI concerns or concerns with pt returning home at discharge. [name] reported there are no guns/weapons in the home. Per [name], he has SI concerns for pt when he is drinking, but not when pt is sober.  [place]  MSW LCSW 08/27/2019, 9:39 AM

## 2019-08-27 NOTE — BHH Suicide Risk Assessment (Signed)
[facility] Admission Suicide Risk Assessment   Nursing information obtained from:  Patient Demographic factors:  Male Current Mental Status:  NA Loss Factors:  Loss of significant relationship Historical Factors:  NA Risk Reduction Factors:  Living with another person, especially a relative  Total Time spent with patient: 1 hour Principal Problem: Bipolar affective disorder, depressed, severe ([place]) Diagnosis:  Principal Problem:   Bipolar affective disorder, depressed, severe ([place]) Active Problems:   Bipolar disorder current episode depressed ([place])  Subjective Data: Patient seen chart reviewed.  Patient came to the hospital intoxicated and it is documented that he was making suicidal statements.  On interview today the patient claims to have no memory of making such statements.  He adamantly denies being suicidal.  He admits that he may have made statements about being depressed but minimizes it.  Continued Clinical Symptoms:  Alcohol Use Disorder Identification Test Final Score (AUDIT): 40 The "Alcohol Use Disorders Identification Test", Guidelines for Use in Primary Care, Second Edition.  World Pharmacologist [facility]). Score between 0-7:  no or low risk or alcohol related problems. Score between 8-15:  moderate risk of alcohol related problems. Score between 16-19:  high risk of alcohol related problems. Score 20 or above:  warrants further diagnostic evaluation for alcohol dependence and treatment.   CLINICAL FACTORS:   Bipolar Disorder:   Bipolar II Alcohol/Substance Abuse/Dependencies   Musculoskeletal: Strength & Muscle Tone: within normal limits Gait & Station: normal Patient leans: N/A  Psychiatric Specialty Exam: Physical Exam  Nursing note and vitals reviewed. Constitutional: He appears well-developed and well-nourished.  HENT:  Head: Normocephalic and atraumatic.  Eyes: Pupils are equal, round, and reactive to light. Conjunctivae are normal.  Neck: Normal range of  motion.  Cardiovascular: Regular rhythm and normal heart sounds.  Respiratory: Effort normal. No respiratory distress.  GI: Soft.  Musculoskeletal: Normal range of motion.  Neurological: He is alert.  Skin: Skin is warm and dry.  Psychiatric: His speech is normal and behavior is normal. Thought content normal. His affect is blunt. Cognition and memory are impaired. He expresses impulsivity. He exhibits abnormal recent memory.    Review of Systems  Constitutional: Negative.   HENT: Negative.   Eyes: Negative.   Respiratory: Negative.   Cardiovascular: Negative.   Gastrointestinal: Negative.   Musculoskeletal: Negative.   Skin: Negative.   Neurological: Negative.   Psychiatric/Behavioral: Positive for memory loss and substance abuse. Negative for depression, hallucinations and suicidal ideas. The patient has insomnia. The patient is not nervous/anxious.     Blood pressure 140/86, pulse 72, temperature 97.9 F (36.6 C), temperature source Oral, resp. rate 17, height 5\' 7"  (1.702 m), weight 62.1 kg, SpO2 100 %.Body mass index is 21.46 kg/m.  General Appearance: Casual  Eye Contact:  Good  Speech:  Slow  Volume:  Decreased  Mood:  Euthymic  Affect:  Constricted  Thought Process:  Goal Directed  Orientation:  Full (Time, Place, and Person)  Thought Content:  Logical  Suicidal Thoughts:  No  Homicidal Thoughts:  No  Memory:  Immediate;   Fair Recent;   Poor Remote;   Fair  Judgement:  Impaired  Insight:  Shallow  Psychomotor Activity:  Decreased  Concentration:  Concentration: Fair  Recall:  [employer] of Knowledge:  Fair  Language:  Fair  Akathisia:  No  Handed:  Right  AIMS (if indicated):     Assets:  Desire for Improvement Housing Resilience  ADL's:  Intact  Cognition:  WNL  Sleep:  Number of Hours: 6.15      COGNITIVE FEATURES THAT CONTRIBUTE TO RISK:  None    SUICIDE RISK:   Mild:  Suicidal ideation of limited frequency, intensity, duration, and  specificity.  There are no identifiable plans, no associated intent, mild dysphoria and related symptoms, good self-control (both objective and subjective assessment), few other risk factors, and identifiable protective factors, including available and accessible social support.  PLAN OF CARE: Patient will be restarted on medication for bipolar disorder.  Monitored for any lingering signs of alcohol withdrawal.  Patient will be engaged in appropriate assessment by treatment team and providers on the unit.  Reassess over the next day or so to see if he is indeed ready for discharge.  Ensure he has outpatient treatment before discharge.  I certify that inpatient services furnished can reasonably be expected to improve the patient's condition.   Alethia Berthold, MD 08/27/2019, 4:27 PM

## 2019-08-27 NOTE — Plan of Care (Signed)
  Problem: Medication: Goal: Compliance with prescribed medication regimen will improve Outcome: Progressing  Patient complaint with medication and knowledgeable of  prescribed medication.

## 2019-08-27 NOTE — Plan of Care (Signed)
D: Pt. During assessments denies si/hi/avh, able to contract for safety. Pt. Endorses a mostly normal mood, but affect is flat and appears lethargic. Pt. Endorsement is of note, incongruent with self-inventory sheet (rated anxiety a 9). Pt. Does appear a bit mildly anxious during our interaction. Pt. Pleasant and cooperative, but forwards little.   A: Q x 15 minute observation checks in place for safety. Patient was and is provided with education throughout shift.  Patient was and will be given/offered medications per orders. Patient was and is encouraged to attend groups, participate in unit activities and continue with plan of care. Pt. Chart and plans of care reviewed. Pt. Given support and encouragement.   R: Patient is complaint with medication and unit procedures. Pt. Frequently observed in his room resting, isolative. Pt. Attends all meals, observed eating good. Pt. Monitored for CIWA and seizures for safety. Pt. Anxiety has improved observably from this morning.    Problem: Safety: Goal: Periods of time without injury will increase Outcome: Progressing   Problem: Medication: Goal: Compliance with prescribed medication regimen will improve Outcome: Progressing   Problem: Coping: Goal: Level of anxiety will decrease Outcome: Progressing

## 2019-08-27 NOTE — BHH Counselor (Signed)
Adult Comprehensive Assessment  Patient ID: [name], male   DOB: 1961-08-17, 58 y.o.   MRN: [id number]  Information Source: Information source: Patient  Current Stressors:  Patient states their primary concerns and needs for treatment are:: "I don't know, I don't need to be here" Patient states their goals for this hospitilization and ongoing recovery are:: "to go back home" Educational / Learning stressors: pt dropped out in 10th grade Employment / Job issues: unemployed, on disability for mental health Family Relationships: pt reports good familial Software engineer / Lack of resources (include bankruptcy): disability, [employer] / Lack of housing: Pt lives with a roommate Physical health (include injuries & life threatening diseases): none reported Substance abuse: Pt reports drinking alcohol, but reports he cut back to about once a month  Living/Environment/Situation:  Living Arrangements: Non-relatives/Friends Living conditions (as described by patient or guardian): "great" Who else lives in the home?: Pt reports he lives with a roommate How long has patient lived in current situation?: 30 years  Family History:  Marital status: Divorced Divorced, when?: 1978 What is your sexual orientation?: heterosexual Has your sexual activity been affected by drugs, alcohol, medication, or emotional stress?: pt denies Does patient have children?: Yes How many children?: 2 How is patient's relationship with their children?: pt reports he has two sons and has a "fair" relationship with them  Childhood History:  By whom was/is the patient raised?: Grandparents Additional childhood history information: Pt reports he was raised by his maternal grandmother and reported that growing up he did not see his parents much Description of patient's relationship with caregiver when they were a child: "good" Patient's description of current relationship with people who  raised him/her: "she is deceased" Does patient have siblings?: Yes Number of Siblings: 3 Description of patient's current relationship with siblings: Pt is the oldest of 23 and reports having 3 sisters who he has good relationships with Did patient suffer any verbal/emotional/physical/sexual abuse as a child?: No Did patient suffer from severe childhood neglect?: No Has patient ever been sexually abused/assaulted/raped as an adolescent or adult?: No Was the patient ever a victim of a crime or a disaster?: No Witnessed domestic violence?: No Has patient been effected by domestic violence as an adult?: No  Education:  Highest grade of school patient has completed: 9th, dropped out in 10th Currently a student?: No Learning disability?: No  Employment/Work Situation:   Employment situation: On disability Why is patient on disability: mental health How long has patient been on disability: 45 years What is the longest time patient has a held a job?: "a few years" 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[place]" Did You Receive Any Psychiatric Treatment/Services While in the [employer]?: No Are There Guns or Other Weapons in [place]?: No Are These Psychologist, educational?: (N/A)  Financial Resources:   Museum/gallery curator resources: Physicist, medical, Medicaid, Receives SSI Does patient have a Programmer, applications or guardian?: No  Alcohol/Substance Abuse:   What has been your use of drugs/alcohol within the last 12 months?: Pt reports he drinks alcohol, but he cut back to only drinking about once a month Alcohol/Substance Abuse Treatment Hx: Past Tx, Inpatient If yes, describe treatment: Pt reports he has been inpatient at many different places, but could not recall the locations/names Has alcohol/substance abuse ever caused legal problems?: No  Social Support System:   Patient's Community Support System: Good Describe Community Support System: "roommate, my friends" Type of  faith/religion: None  How does patient's faith help to cope with current illness?: N/A  Leisure/Recreation:   Leisure and Hobbies: "not a lot"  Strengths/Needs:   What is the patient's perception of their strengths?: "I cant think of nothing" Patient states these barriers may affect/interfere with their treatment: None reported Patient states these barriers may affect their return to the community: None reported Other important information patient would like considered in planning for their treatment: Pt reports he does not want inpatient treatment and wants to continue with his outpatient at [facility]  Discharge Plan:   Currently receiving community mental health services: Yes (From Whom)([facility], Christel [facility]) Patient states concerns and preferences for aftercare planning are: continue current mental health treatment Patient states they will know when they are safe and ready for discharge when: "I just know" Does patient have access to transportation?: Yes Does patient have financial barriers related to discharge medications?: No Will patient be returning to same living situation after discharge?: Yes  Summary/Recommendations:   Summary and Recommendations (to be completed by the evaluator): Pt is a 58 yo male living in [place], [place] [facility]) with a roommate. Pt presents to the hospital seeking treatment for SI, medication stbailization, and alcohol detox complications. Pt has a diagnosis of Bipolar affective disorder, depresseed, severe. Pt is divorced since 1978, unemployed on disability, has 2 children, reports a good support system, denies history of abuse/trauma, and is agreeable to continue current outpatient treatment at [facility]. Pt denies SI/HI/AVH currently. Recommendations for pt include: crisis stabilization, therapeutic milieu, encourage group attendance and participation, medication management for mood stabilization, and development for comprehensive  mental wellness plan. CSW assessing for appropriate referrals.  [place] MSW LCSW 08/27/2019 9:38 AM
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
## 2019-08-28 MED ORDER — LITHIUM CARBONATE ER 450 MG PO TBCR: 450.0000 mg | EXTENDED_RELEASE_TABLET | Freq: Two times a day (BID) | ORAL | 0 refills | Status: AC

## 2019-08-28 MED ORDER — TICAGRELOR 60 MG PO TABS: 60.0000 mg | ORAL_TABLET | Freq: Two times a day (BID) | ORAL | 0 refills | Status: DC

## 2019-08-28 MED ORDER — THIAMINE HCL 100 MG PO TABS: 100.0000 mg | ORAL_TABLET | Freq: Every day | ORAL | 0 refills | Status: DC

## 2019-08-28 MED ORDER — DIVALPROEX SODIUM ER 500 MG PO TB24: 1000.0000 mg | ORAL_TABLET | Freq: Every day | ORAL | 0 refills | Status: DC

## 2019-08-28 MED ORDER — IRBESARTAN 150 MG PO TABS: 150.0000 mg | ORAL_TABLET | Freq: Every day | ORAL | 0 refills | Status: DC

## 2019-08-28 MED ORDER — CARIPRAZINE HCL 4.5 MG PO CAPS: 4.5000 mg | ORAL_CAPSULE | ORAL | 0 refills | Status: AC

## 2019-08-28 MED ORDER — ASPIRIN 81 MG PO TBEC: 81.0000 mg | DELAYED_RELEASE_TABLET | Freq: Every day | ORAL | 0 refills | Status: DC

## 2019-08-28 MED ORDER — ATORVASTATIN CALCIUM 40 MG PO TABS: 40.0000 mg | ORAL_TABLET | Freq: Every day | ORAL | 0 refills | Status: DC

## 2019-08-28 MED ORDER — PANTOPRAZOLE SODIUM 40 MG PO TBEC: 40.0000 mg | DELAYED_RELEASE_TABLET | Freq: Every day | ORAL | 0 refills | Status: AC

## 2019-08-28 MED ORDER — CARVEDILOL 3.125 MG PO TABS: 3.1250 mg | ORAL_TABLET | Freq: Two times a day (BID) | ORAL | 0 refills | Status: DC

## 2019-08-28 NOTE — Discharge Summary (Signed)
Physician Discharge Summary Note  Patient:  [name] is an 58 y.o., male MRN:  [id number] DOB:  July 26, 1961 Patient phone:  [phone] (home)  Patient address:   [place] [address],  Total Time spent with patient: 45 minutes  Date of Admission:  08/26/2019 Date of Discharge: August 28, 2019  Reason for Admission: Patient was admitted after presenting very intoxicated with reports that he had made suicidal threats.  Principal Problem: Bipolar affective disorder, depressed, severe ([place]) Discharge Diagnoses: Principal Problem:   Bipolar affective disorder, depressed, severe ([place]) Active Problems:   Bipolar disorder current episode depressed ([place])   Past Psychiatric History: Patient has a long history of alcohol abuse mood instability diagnosis of bipolar disorder  Past Medical History:  Past Medical History:  Diagnosis Date  . Anxiety   . Bipolar affective ([place])    takes Lithium daily  . Broken fingers   . Coronary artery disease    a. 1997 PCI: RCA ([facility]); b. 2008 PCI: RCA; c. 05/2012 Cath: LCx 100 (previously stented - ? date), RCA stent w/ mild ISR, LAD nonobs, EF 35%; d. 06/2013 Cath: stable anatomy; e. 03/2018 Inf STEMI/PCI: LCX 100o/p ISR (CTO), RCA 142m/d (3.0x38 Resolute Onyx DES), EF 40-45%.   . Depression   . GERD (gastroesophageal reflux disease)    takes Protonix daily  . Hepatitis C    "diagnosed in the past 2 wk" (10/22/2012)  . History of ETOH abuse    quit 2007  . History of radiation therapy 12/17/17- 01/24/18   Larynx treated to 63 gy in 28 fx of 2.25 Gy  . Hyperlipidemia   . Hypertension    "pt. denies - no longer taking blood pressure medications"  . Ischemic cardiomyopathy    a. 03/2018 LV gram: EF 40-45% @ time of MI.  Marland Kitchen Myocardial infarction ([place])    x 4  . PAD (peripheral artery disease) ([place])    a. 2010 PTA & stent to left CIA & left SFA; b. 05/2012 PTA & stent to distal LCIA; c. 10/2012 thrombectomy and stents to [place]; d. 11/2014 s/p Ao-Bifem bypass.  . Schizophrenia ([place])   . Seizures ([place])    "when I get anxious" (Stated last seizure three months ago)  . Tobacco abuse   . Urinary frequency     Past Surgical History:  Procedure Laterality Date  . ABDOMINAL AORTAGRAM N/A 10/22/2012   Procedure: ABDOMINAL Maxcine Ham;  Surgeon: Wellington Hampshire, MD;  Location: [facility];  Service: Cardiovascular;  Laterality: N/A;  . ABDOMINAL AORTAGRAM N/A 11/11/2014   Procedure: ABDOMINAL Maxcine Ham;  Surgeon: Wellington Hampshire, MD;  Location: [facility];  Service: Cardiovascular;  Laterality: N/A;  . ANTERIOR CERVICAL DECOMP/DISCECTOMY FUSION  01/2011   plate & screw placement   . ANTERIOR CERVICAL DECOMP/DISCECTOMY FUSION N/A 01/26/2017   Procedure: ANTERIOR CERVICAL DECOMPRESSION/DISCECTOMY FUSION CERVICAL FOUR- CERVICAL FIVE;  Surgeon: Ashok Pall, MD;  Location: [facility];  Service: Neurosurgery;  Laterality: N/A;  ANTERIOR CERVICAL DECOMPRESSION/DISCECTOMY FUSION CERVICAL FOUR- CERVICAL FIVE  . AORTA - BILATERAL FEMORAL ARTERY BYPASS GRAFT N/A 11/20/2014   Procedure: AORTA BIFEMORAL BYPASS GRAFT;  Surgeon: Mal Misty, MD;  Location: [facility];  Service: Vascular;  Laterality: N/A;  . BACK SURGERY    . BRAIN SURGERY  1989   subdural hematoma  . CARDIAC CATHETERIZATION     Stent - 12  . CHOLECYSTECTOMY N/A 06/29/2017   Procedure: LAPAROSCOPIC CHOLECYSTECTOMY WITH INTRAOPERATIVE CHOLANGIOGRAM, POSSIBLE OPEN;  Surgeon: Robert Bellow, MD;  Location: [facility];  Service: General;  Laterality: N/A;  . CORONARY ANGIOPLASTY WITH STENT PLACEMENT  03/2007   to RCA  . CORONARY/GRAFT ACUTE MI REVASCULARIZATION N/A 03/07/2018   Procedure: Coronary/Graft Acute MI Revascularization;  Surgeon: Isaias Cowman, MD;  Location: [facility];  Service: Cardiovascular;  Laterality: N/A;  . HERNIA REPAIR    . ILIAC ARTERY STENT  10/22/2012   "2" (10/22/2012)  . ILIAC ARTERY STENT  06/2012   left  . INCISIONAL HERNIA  REPAIR N/A 11/08/2015   Procedure: INCISIONAL HERNIA REPAIR WITH MYOFASCIAL RELEASE;  Surgeon: Rolm Bookbinder, MD;  Location: [facility];  Service: General;  Laterality: N/A;  . INSERTION OF MESH N/A 11/08/2015   Procedure: INSERTION OF MESH;  Surgeon: Rolm Bookbinder, MD;  Location: [facility];  Service: General;  Laterality: N/A;  . LEFT HEART CATH AND CORONARY ANGIOGRAPHY N/A 03/07/2018   Procedure: LEFT HEART CATH AND CORONARY ANGIOGRAPHY;  Surgeon: Isaias Cowman, MD;  Location: [facility];  Service: Cardiovascular;  Laterality: N/A;  . LEFT HEART CATHETERIZATION WITH CORONARY ANGIOGRAM N/A 05/29/2012   Procedure: LEFT HEART CATHETERIZATION WITH CORONARY ANGIOGRAM;  Surgeon: Wellington Hampshire, MD;  Location: [facility];  Service: Cardiovascular;  Laterality: N/A;  . LIVER BIOPSY  10/2012  . LOWER EXTREMITY ANGIOGRAM N/A 05/29/2012   Procedure: LOWER EXTREMITY ANGIOGRAM;  Surgeon: Wellington Hampshire, MD;  Location: [facility];  Service: Cardiovascular;  Laterality: N/A;  . MICROLARYNGOSCOPY WITH CO2 LASER AND EXCISION OF VOCAL CORD LESION N/A 11/21/2017   Procedure: MICROLARYNGOSCOPY WITH EXCISION OF VOCAL CORD LESION;  Surgeon: Jerrell Belfast, MD;  Location: [facility];  Service: ENT;  Laterality: N/A;  . PERCUTANEOUS STENT INTERVENTION Left 05/29/2012   Procedure: PERCUTANEOUS STENT INTERVENTION;  Surgeon: Wellington Hampshire, MD;  Location: [facility];  Service: Cardiovascular;  Laterality: Left;  . POSTERIOR FUSION CERVICAL SPINE  2012  . SUBDURAL HEMATOMA EVACUATION VIA CRANIOTOMY     Family History:  Family History  Problem Relation Age of Onset  . Emphysema Father   . COPD Mother   . Diabetes Other   . Alcohol abuse Other   . Hypertension Other   . Hyperlipidemia Other   . Seizures Other    Family Psychiatric  History: Alcohol abuse Social History:  Social History   Substance and Sexual Activity  Alcohol Use Yes  . Alcohol/week: 0.0 standard drinks   Comment: 1/2 gallon  or 20 beers daily     Social History   Substance and Sexual Activity  Drug Use Yes  . Types: Marijuana   Comment: last smoked 1 month ago    Social History   Socioeconomic History  . Marital status: Divorced    Spouse name: Not on file  . Number of children: Not on file  . Years of education: Not on file  . Highest education level: Not on file  Occupational History  . Occupation: Disabled    Fish farm manager: UNEMPLOYED  Social Needs  . Financial resource strain: Not on file  . Food insecurity    Worry: Not on file    Inability: Not on file  . Transportation needs    Medical: No    Non-medical: No  Tobacco Use  . Smoking status: Current Every Day Smoker    Packs/day: 2.00    Years: 40.00    Pack years: 80.00    Types: Cigarettes  . Smokeless tobacco: Never Used  . Tobacco comment: "  ive slowed down recently"   Substance and Sexual Activity  . Alcohol use: Yes    Alcohol/week: 0.0 standard drinks    Comment: 1/2 gallon or 20 beers daily  . Drug use: Yes    Types: Marijuana    Comment: last smoked 1 month ago  . Sexual activity: Yes  Lifestyle  . Physical activity    Days per week: Not on file    Minutes per session: Not on file  . Stress: Not on file  Relationships  . Social Herbalist on phone: Not on file    Gets together: Not on file    Attends religious service: Not on file    Active member of club or organization: Not on file    Attends meetings of clubs or organizations: Not on file    Relationship status: Not on file  Other Topics Concern  . Not on file  Social History Narrative   17 years Bipolar/ADHD.   Pt gets regular exercise.    Hospital Course: Patient was admitted to the psychiatric unit.  15-minute checks for safety.  Orders for detox medicines by protocol.  After the first evening the patient showed minimal signs of alcohol withdrawal.  He was not shaky and his vital signs were stable.  There was never any sign of delirium.  Patient  reported that his mood was feeling fine.  He denied having any suicidal thoughts at all.  Patient's affect was blunted much of the time and it was presented to him that he did have symptoms recently that would be consistent with major depression.  Patient once again denied suicidal thoughts and said that he felt safe and confident following up with his outpatient provider.  He was continued on his usual medicines for bipolar depression.  Patient at the time of discharge is agreeable to following up with his usual outpatient psychiatrist.  He is no longer getting any medicine for withdrawal and is not having any symptoms of alcohol detox.  Psychoeducation has been provided about the obvious importance of stopping drinking both to control his depression and help with his overall health.  Physical Findings: AIMS:  , ,  ,  ,    CIWA:  CIWA-Ar Total: 2 COWS:     Musculoskeletal: Strength & Muscle Tone: within normal limits Gait & Station: normal Patient leans: N/A  Psychiatric Specialty Exam: Physical Exam  Nursing note and vitals reviewed. Constitutional: He appears well-developed and well-nourished.  HENT:  Head: Normocephalic and atraumatic.  Eyes: Pupils are equal, round, and reactive to light. Conjunctivae are normal.  Neck: Normal range of motion.  Cardiovascular: Regular rhythm and normal heart sounds.  Respiratory: Effort normal.  GI: Soft.  Musculoskeletal: Normal range of motion.  Neurological: He is alert.  Skin: Skin is warm and dry.  Psychiatric: Judgment normal. His affect is blunt. His speech is delayed. He is slowed. Thought content is not paranoid. He expresses no homicidal and no suicidal ideation. He exhibits abnormal recent memory.    Review of Systems  Constitutional: Negative.   HENT: Negative.   Eyes: Negative.   Respiratory: Negative.   Cardiovascular: Negative.   Gastrointestinal: Negative.   Musculoskeletal: Negative.   Skin: Negative.   Neurological:  Negative.   Psychiatric/Behavioral: Positive for memory loss and substance abuse. Negative for depression, hallucinations and suicidal ideas. The patient has insomnia. The patient is not nervous/anxious.     Blood pressure 131/87, pulse 67, temperature 97.9 F (36.6 C), temperature  source Oral, resp. rate 18, height 5\' 7"  (1.702 m), weight 62.1 kg, SpO2 99 %.Body mass index is 21.46 kg/m.  General Appearance: Casual  Eye Contact:  Fair  Speech:  Clear and Coherent  Volume:  Decreased  Mood:  Euthymic  Affect:  Constricted  Thought Process:  Goal Directed  Orientation:  Full (Time, Place, and Person)  Thought Content:  Logical  Suicidal Thoughts:  No  Homicidal Thoughts:  No  Memory:  Immediate;   Fair Recent;   Fair Remote;   Fair  Judgement:  Fair  Insight:  Fair  Psychomotor Activity:  Decreased  Concentration:  Concentration: Fair  Recall:  [place] of Knowledge:  Fair  Language:  Fair  Akathisia:  No  Handed:  Right  AIMS (if indicated):     Assets:  Communication Skills Desire for Improvement Financial Resources/Insurance Housing Resilience Social Support  ADL's:  Intact  Cognition:  WNL  Sleep:  Number of Hours: 7        Has this patient used any form of tobacco in the last 30 days? (Cigarettes, Smokeless Tobacco, Cigars, and/or Pipes) Yes, Yes, A prescription for an FDA-approved tobacco cessation medication was offered at discharge and the patient refused  Blood Alcohol level:  Lab Results  Component Value Date   ETH 268 (H) 08/25/2019   ETH 345 ([place]) [id number]    Metabolic Disorder Labs:  No results found for: HGBA1C, MPG No results found for: PROLACTIN Lab Results  Component Value Date   CHOL 137 06/04/2019   TRIG 83 06/04/2019   HDL 59 06/04/2019   CHOLHDL 2.3 06/04/2019   VLDL 12 03/07/2018   LDLCALC 61 06/04/2019   [place] 70 03/07/2018    See Psychiatric Specialty Exam and Suicide Risk Assessment completed by Attending Physician prior  to discharge.  Discharge destination:  Home  Is patient on multiple antipsychotic therapies at discharge:  No   Has Patient had three or more failed trials of antipsychotic monotherapy by history:  No  Recommended Plan for Multiple Antipsychotic Therapies: NA  Discharge Instructions    Diet - low sodium heart healthy   Complete by: As directed    Increase activity slowly   Complete by: As directed      Allergies as of 08/28/2019      Reactions   Lisinopril Other (See Comments)   Other reaction(s): Elevated creatine and very elevated potassium      Medication List    STOP taking these medications   aspirin 81 MG chewable tablet Replaced by: aspirin 81 MG EC tablet   phosphorus 155-852-130 MG tablet Commonly known as: K PHOS NEUTRAL   QUEtiapine 50 MG tablet Commonly known as: SEROQUEL   telmisartan 40 MG tablet Commonly known as: MICARDIS Replaced by: irbesartan 150 MG tablet     TAKE these medications     Indication  aspirin 81 MG EC tablet Take 1 tablet (81 mg total) by mouth daily. Start taking on: August 29, 2019 Replaces: aspirin 81 MG chewable tablet  Indication: Stable Angina Pectoris   atorvastatin 40 MG tablet Commonly known as: LIPITOR Take 1 tablet (40 mg total) by mouth daily at 6 PM.  Indication: High Amount of Fats in the Blood   Cariprazine HCl 4.5 MG Caps Take 1 capsule (4.5 mg total) by mouth daily. What changed:   medication strength  when to take this  additional instructions  Indication: Major Depressive Disorder   carvedilol 3.125 MG tablet Commonly known as:  COREG Take 1 tablet (3.125 mg total) by mouth 2 (two) times daily with a meal.  Indication: High Blood Pressure Disorder   divalproex 500 MG 24 hr tablet Commonly known as: DEPAKOTE ER Take 2 tablets (1,000 mg total) by mouth at bedtime.  Indication: MIXED BIPOLAR AFFECTIVE DISORDER   irbesartan 150 MG tablet Commonly known as: AVAPRO Take 1 tablet (150 mg total)  by mouth daily. Start taking on: August 29, 2019 Replaces: telmisartan 40 MG tablet  Indication: High Blood Pressure Disorder   lithium carbonate 450 MG CR tablet Commonly known as: ESKALITH Take 1 tablet (450 mg total) by mouth every 12 (twelve) hours. What changed: when to take this  Indication: Major Depressive Disorder   pantoprazole 40 MG tablet Commonly known as: PROTONIX Take 1 tablet (40 mg total) by mouth daily at 12 noon. What changed:   when to take this  additional instructions  Indication: Gastroesophageal Reflux Disease   thiamine 100 MG tablet Take 1 tablet (100 mg total) by mouth daily. Start taking on: August 29, 2019  Indication: Loss of Appetite   ticagrelor 60 MG Tabs tablet Commonly known as: Brilinta Take 1 tablet (60 mg total) by mouth 2 (two) times daily.  Indication: Cerebrovascular Accident or Stroke      Follow-up [facility] Follow up on 09/02/2019.   Why: You have a telepsychiatry appointment scheduled on Tuesday 09/02/2019 at 2:00PM. They will call you at 2:00pm so please be available via phone. Thank You! Contact information: [place] [address] [phone]           Follow-up recommendations:  Activity:  Activity as tolerated Diet:  Regular diet Other:  Follow-up with outpatient treatment with regular psychiatrist.  Comments: Strongly encourage patient to stop alcohol abuse  Signed: Alethia Berthold, MD 08/28/2019, 11:16 AM

## 2019-08-28 NOTE — BHH Suicide Risk Assessment (Signed)
[facility] Discharge Suicide Risk Assessment   Principal Problem: Bipolar affective disorder, depressed, severe ([place]) Discharge Diagnoses: Principal Problem:   Bipolar affective disorder, depressed, severe ([place]) Active Problems:   Bipolar disorder current episode depressed ([place])   Total Time spent with patient: 45 minutes  Musculoskeletal: Strength & Muscle Tone: within normal limits Gait & Station: normal Patient leans: N/A  Psychiatric Specialty Exam: Review of Systems  Constitutional: Negative.   HENT: Negative.   Eyes: Negative.   Respiratory: Negative.   Cardiovascular: Negative.   Gastrointestinal: Negative.   Musculoskeletal: Negative.   Skin: Negative.   Neurological: Negative.   Psychiatric/Behavioral: Positive for substance abuse. Negative for depression, hallucinations, memory loss and suicidal ideas. The patient is not nervous/anxious and does not have insomnia.     Blood pressure 131/87, pulse 67, temperature 97.9 F (36.6 C), temperature source Oral, resp. rate 18, height 5\' 7"  (1.702 m), weight 62.1 kg, SpO2 99 %.Body mass index is 21.46 kg/m.  General Appearance: Casual  Eye Contact::  Fair  Speech:  Slow409  Volume:  Decreased  Mood:  Euthymic  Affect:  Constricted  Thought Process:  Coherent  Orientation:  Full (Time, Place, and Person)  Thought Content:  Logical  Suicidal Thoughts:  No  Homicidal Thoughts:  No  Memory:  Immediate;   Fair Recent;   Poor Remote;   Fair  Judgement:  Fair  Insight:  Fair  Psychomotor Activity:  Normal  Concentration:  Fair  Recall:  [employer] of Knowledge:Fair  Language: Fair  Akathisia:  No  Handed:  Right  AIMS (if indicated):     Assets:  Desire for Improvement Housing  Sleep:  Number of Hours: 7  Cognition: WNL  ADL's:  Intact   Mental Status Per Nursing Assessment::   On Admission:  NA  Demographic Factors:  Male, Divorced or widowed, Caucasian, Low socioeconomic status and Unemployed  Loss  Factors: Decline in physical health  Historical Factors: Impulsivity  Risk Reduction Factors:   Living with another person, especially a relative and Positive therapeutic relationship  Continued Clinical Symptoms:  Alcohol/Substance Abuse/Dependencies  Cognitive Features That Contribute To Risk:  None    Suicide Risk:  Minimal: No identifiable suicidal ideation.  Patients presenting with no risk factors but with morbid ruminations; may be classified as minimal risk based on the severity of the depressive symptoms  Follow-up [facility] Follow up on 09/02/2019.   Why: You have a telepsychiatry appointment scheduled on Tuesday 09/02/2019 at 2:00PM. They will call you at 2:00pm so please be available via phone. Thank You! Contact information: [place] [address] [phone]           Plan Of Care/Follow-up recommendations:  Activity:  Patient seen and chart reviewed.  Patient is denying any suicidal thoughts whatsoever.  Indicates positive plans for the future.  Describes his mood as being fine although his affect still looks blunted.  Physically stable.  No evidence of psychosis.  Has not shown any dangerous behavior in the hospital.  Activity as tolerated Diet:  Regular diet Other:  Follow-up with [facility] care services and Dr. Sanda Klein, MD 08/28/2019, 11:08 AM

## 2019-08-28 NOTE — Progress Notes (Signed)
D: Patient is aware of  Discharge this shift .  A:Patient denies suicidal /homicidal ideations. Patient received all belongings brought in. No Storage medications. Writer reviewed Discharge Summary, Suicide Risk Assessment, and Transitional Record. Patient also received Prescriptions   from  MD. Aware  Of follow up appointment .  R: Patient left unit with no questions  Or concerns  With friend.

## 2019-08-28 NOTE — Progress Notes (Signed)
  [facility] Adult Case Management Discharge Plan :  Will you be returning to the same living situation after discharge:  Yes,  lives with roommate At discharge, do you have transportation home?: Yes,  pt reports roommat will pick him up Do you have the ability to pay for your medications: Yes,  [name] [employer]  Release of information consent forms completed and in the chart;  Patient's signature needed at discharge.  Patient to Follow up at: Follow-up [facility] Follow up on 09/02/2019.   Why: You have a telepsychiatry appointment scheduled on Tuesday 09/02/2019 at 2:00PM. They will call you at 2:00pm so please be available via phone. Thank You! Contact information: [place] [address] [phone]           Next level of care provider has access to [place] and Suicide Prevention discussed: Yes,  Oliver Barre, roommate     Has patient been referred to the Quitline?: N/A patient is not a smoker  Patient has been referred for addiction treatment: N/A  Yvette Rack, LCSW 08/28/2019, 10:59 AM

## 2019-08-28 NOTE — Progress Notes (Signed)
Recreation Therapy Notes   Date: 08/28/2019  Time: 9:30 am    Location: Craft room   Behavioral response: N/A   Intervention Topic: Communication   Discussion/Intervention: Patient did not attend group.   Clinical Observations/Feedback:  Patient did not attend group.   Zephyr Ridley LRT/CTRS        [name] 08/28/2019 12:04 PM

## 2019-08-28 NOTE — Tx Team (Signed)
Interdisciplinary Treatment and Diagnostic Plan Update  08/28/2019 Time of Session: 9am  [name] MRN: [id number]  Principal Diagnosis: Bipolar affective disorder, depressed, severe ([place])  Secondary Diagnoses: Principal Problem:   Bipolar affective disorder, depressed, severe ([place]) Active Problems:   Bipolar disorder current episode depressed ([place])   Current Medications:  Current Facility-Administered Medications  Medication Dose Route Frequency Provider Last Rate Last Dose  . acetaminophen (TYLENOL) tablet 650 mg  650 mg Oral Q6H PRN Cristofano, Dorene Ar, MD       Or  . acetaminophen (TYLENOL) suppository 650 mg  650 mg Rectal Q6H PRN Cristofano, Dorene Ar, MD      . alum & mag hydroxide-simeth (MAALOX/MYLANTA) 200-200-20 MG/5ML suspension 30 mL  30 mL Oral Q4H PRN Cristofano, Dorene Ar, MD      . aspirin EC tablet 81 mg  81 mg Oral Daily Cristofano, Dorene Ar, MD   81 mg at 08/28/19 0757  . atorvastatin (LIPITOR) tablet 40 mg  40 mg Oral q1800 Cristofano, Dorene Ar, MD   40 mg at 08/27/19 1731  . cariprazine (VRAYLAR) capsule 4.5 mg  4.5 mg Oral Q24H Cristofano, Dorene Ar, MD   4.5 mg at 08/27/19 2208  . carvedilol (COREG) tablet 3.125 mg  3.125 mg Oral BID WC Cristofano, Dorene Ar, MD   3.125 mg at 08/28/19 0757  . divalproex (DEPAKOTE ER) 24 hr tablet 1,000 mg  1,000 mg Oral QHS Cristofano, Dorene Ar, MD   1,000 mg at 08/27/19 2123  . docusate sodium (COLACE) capsule 100 mg  100 mg Oral BID Cristofano, Dorene Ar, MD   100 mg at 08/28/19 0758  . folic acid (FOLVITE) tablet 1 mg  1 mg Oral Daily Cristofano, Dorene Ar, MD   1 mg at 08/28/19 0757  . irbesartan (AVAPRO) tablet 150 mg  150 mg Oral Daily Cristofano, Dorene Ar, MD   150 mg at 08/28/19 0759  . lithium carbonate (ESKALITH) CR tablet 450 mg  450 mg Oral Q12H Cristofano, Dorene Ar, MD   450 mg at 08/28/19 0800  . LORazepam (ATIVAN) tablet 1-4 mg  1-4 mg Oral Q1H PRN Clapacs, Madie Reno, MD   1 mg at 08/27/19 1733   Or  . LORazepam (ATIVAN) injection 1-4 mg   1-4 mg Intravenous Q1H PRN Clapacs, John T, MD      . magnesium hydroxide (MILK OF MAGNESIA) suspension 30 mL  30 mL Oral Daily PRN Cristofano, Dorene Ar, MD      . multivitamin with minerals tablet 1 tablet  1 tablet Oral Daily Cristofano, Dorene Ar, MD   1 tablet at 08/28/19 0757  . nitroGLYCERIN (NITROSTAT) SL tablet 0.4 mg  0.4 mg Sublingual Q5 min PRN Cristofano, Paul A, MD      . ondansetron (ZOFRAN) tablet 4 mg  4 mg Oral Q6H PRN Cristofano, Dorene Ar, MD       Or  . ondansetron (ZOFRAN) injection 4 mg  4 mg Intravenous Q6H PRN Cristofano, Paul A, MD      . pantoprazole (PROTONIX) EC tablet 40 mg  40 mg Oral Q1200 Cristofano, Dorene Ar, MD   40 mg at 08/27/19 1226  . thiamine (VITAMIN B-1) tablet 100 mg  100 mg Oral Daily Cristofano, Paul A, MD   100 mg at 08/28/19 0757   Or  . thiamine (B-1) injection 100 mg  100 mg Intravenous Daily Cristofano, Paul A, MD      . ticagrelor (BRILINTA) tablet 60 mg  60 mg  Oral BID Dixie Dials, MD   60 mg at 08/28/19 [id number]   PTA Medications: Medications Prior to Admission  Medication Sig Dispense Refill Last Dose  . aspirin 81 MG chewable tablet Chew 1 tablet (81 mg total) by mouth daily. 30 tablet 1   . atorvastatin (LIPITOR) 40 MG tablet Take 1 tablet (40 mg total) by mouth daily at 6 PM. 30 tablet 1   . Cariprazine HCl (VRAYLAR PO) Take 4.5 mg by mouth at bedtime. (per calling [facility] in [place] 08/25/2019)     . carvedilol (COREG) 3.125 MG tablet Take 3.125 mg by mouth 2 (two) times daily with a meal.      . divalproex (DEPAKOTE ER) 500 MG 24 hr tablet Take 1,000 mg by mouth at bedtime.   2   . lithium carbonate (ESKALITH) 450 MG CR tablet Take 450 mg by mouth 2 (two) times daily.     . pantoprazole (PROTONIX) 40 MG tablet Take 40 mg by mouth daily. In the afternoon.  3   . [EXPIRED] phosphorus (K PHOS NEUTRAL) 155-852-130 MG tablet Take 2 tablets (500 mg total) by mouth every 4 (four) hours for 1 day.     Marland Kitchen QUEtiapine (SEROQUEL) 50 MG tablet Take 25  mg by mouth every 4 (four) hours as needed for agitation.     Marland Kitchen telmisartan (MICARDIS) 40 MG tablet Take 1 tablet (40 mg total) by mouth daily. 90 tablet 2   . ticagrelor (BRILINTA) 60 MG TABS tablet Take 1 tablet (60 mg total) by mouth 2 (two) times daily. 180 tablet 2     Patient Stressors: Health problems Medication change or noncompliance Substance abuse  Patient Strengths: Ability for insight Average or above average intelligence Capable of independent living Communication skills Supportive family/friends  Treatment Modalities: Medication Management, Group therapy, Case management,  1 to 1 session with clinician, Psychoeducation, Recreational therapy.   Physician Treatment Plan for Primary Diagnosis: Bipolar affective disorder, depressed, severe ([place]) Long Term Goal(s): Improvement in symptoms so as ready for discharge Improvement in symptoms so as ready for discharge   Short Term Goals: Ability to disclose and discuss suicidal ideas Ability to demonstrate self-control will improve Ability to verbalize feelings will improve Ability to identify triggers associated with substance abuse/mental health issues will improve  Medication Management: Evaluate patient's response, side effects, and tolerance of medication regimen.  Therapeutic Interventions: 1 to 1 sessions, Unit Group sessions and Medication administration.  Evaluation of Outcomes: Adequate for Discharge  Physician Treatment Plan for Secondary Diagnosis: Principal Problem:   Bipolar affective disorder, depressed, severe ([place]) Active Problems:   Bipolar disorder current episode depressed ([place])  Long Term Goal(s): Improvement in symptoms so as ready for discharge Improvement in symptoms so as ready for discharge   Short Term Goals: Ability to disclose and discuss suicidal ideas Ability to demonstrate self-control will improve Ability to verbalize feelings will improve Ability to identify triggers associated with  substance abuse/mental health issues will improve     Medication Management: Evaluate patient's response, side effects, and tolerance of medication regimen.  Therapeutic Interventions: 1 to 1 sessions, Unit Group sessions and Medication administration.  Evaluation of Outcomes: Adequate for Discharge   RN Treatment Plan for Primary Diagnosis: Bipolar affective disorder, depressed, severe ([place]) Long Term Goal(s): Knowledge of disease and therapeutic regimen to maintain health will improve  Short Term Goals: Ability to participate in decision making will improve, Ability to disclose and discuss suicidal ideas, Ability to identify and develop effective  coping behaviors will improve and Compliance with prescribed medications will improve  Medication Management: RN will administer medications as ordered by provider, will assess and evaluate patient's response and provide education to patient for prescribed medication. RN will report any adverse and/or side effects to prescribing provider.  Therapeutic Interventions: 1 on 1 counseling sessions, Psychoeducation, Medication administration, Evaluate responses to treatment, Monitor vital signs and CBGs as ordered, Perform/monitor CIWA, COWS, AIMS and Fall Risk screenings as ordered, Perform wound care treatments as ordered.  Evaluation of Outcomes: Adequate for Discharge   LCSW Treatment Plan for Primary Diagnosis: Bipolar affective disorder, depressed, severe ([place]) Long Term Goal(s): Safe transition to appropriate next level of care at discharge, Engage patient in therapeutic group addressing interpersonal concerns.  Short Term Goals: Engage patient in aftercare planning with referrals and resources  Therapeutic Interventions: Assess for all discharge needs, 1 to 1 time with Social worker, Explore available resources and support systems, Assess for adequacy in community support network, Educate family and significant other(s) on suicide prevention,  Complete Psychosocial Assessment, Interpersonal group therapy.  Evaluation of Outcomes: Adequate for Discharge   Progress in Treatment: Attending groups: No. Participating in groups: No. Taking medication as prescribed: Yes. Toleration medication: Yes. Family/Significant other contact made: Yes, individual(s) contacted:  Oliver Barre, roommate Patient understands diagnosis: Yes. Discussing patient identified problems/goals with staff: Yes. Medical problems stabilized or resolved: Yes. Denies suicidal/homicidal ideation: Yes. Issues/concerns per patient self-inventory: No. Other: NA  New problem(s) identified: No, Describe:  None reported  New Short Term/Long Term Goal(s):Attend outpatient treatment, take medication as prescribed, develop and implement healthy coping methods  Patient Goals: Pt did not attend treatment team meeting, goal unable to be obtained.    Discharge Plan or Barriers: Pt will return home and follow up at [facility]  Reason for Continuation of Hospitalization: Pt will discharge on 08/28/19  Estimated Length of Stay: D/C 08/28/2019  Attendees: Patient: 08/28/2019 11:10 AM  Physician: Alethia Berthold 08/28/2019 11:10 AM  Nursing: Polly Cobia 08/28/2019 11:10 AM  RN Care Manager: 08/28/2019 11:10 AM  Social Worker: Sanjuana Kava [place] 08/28/2019 11:10 AM  Recreational Therapist: Isaias Sakai [place] 08/28/2019 11:10 AM  Other:  08/28/2019 11:10 AM  Other:  08/28/2019 11:10 AM  Other: 08/28/2019 11:10 AM    Scribe for Treatment Team: Yvette Rack, LCSW 08/28/2019 11:10 AM

## 2019-08-28 NOTE — Plan of Care (Signed)
  Problem: Education: Goal: Knowledge of [facility] Education information/materials will improve Outcome: Adequate for Discharge   Problem: Coping: Goal: Ability to verbalize frustrations and anger appropriately will improve Outcome: Adequate for Discharge Goal: Ability to demonstrate self-control will improve Outcome: Adequate for Discharge   Problem: Safety: Goal: Periods of time without injury will increase Outcome: Adequate for Discharge   Problem: Medication: Goal: Compliance with prescribed medication regimen will improve Outcome: Adequate for Discharge   Problem: Coping: Goal: Will verbalize feelings Outcome: Adequate for Discharge   Problem: Coping: Goal: Level of anxiety will decrease Outcome: Adequate for Discharge

## 2019-11-07 ENCOUNTER — Other Ambulatory Visit: Payer: Self-pay

## 2019-11-07 DIAGNOSIS — Z5321 Procedure and treatment not carried out due to patient leaving prior to being seen by health care provider: Secondary | ICD-10-CM | POA: Diagnosis not present

## 2019-11-07 DIAGNOSIS — R109 Unspecified abdominal pain: Secondary | ICD-10-CM | POA: Insufficient documentation

## 2019-11-07 LAB — CBC
HCT: 46.5 % (ref 39.0–52.0)
Hemoglobin: 15.8 g/dL (ref 13.0–17.0)
MCH: 33.3 pg (ref 26.0–34.0)
MCHC: 34 g/dL (ref 30.0–36.0)
MCV: 98.1 fL (ref 80.0–100.0)
Platelets: 248 10*3/uL (ref 150–400)
RBC: 4.74 MIL/uL (ref 4.22–5.81)
RDW: 12.4 % (ref 11.5–15.5)
WBC: 8.2 10*3/uL (ref 4.0–10.5)
nRBC: 0 % (ref 0.0–0.2)

## 2019-11-07 NOTE — ED Notes (Signed)
Patient given a mask at the stat desk and asked to wear. Patient proceeds to place mask on top of his head and ask if this is how you wear. Patient asked to place the mask on correctly. Patient then places the mask under his chin. Patient then asked to put the mask on correctly due to hospital policy.

## 2019-11-07 NOTE — ED Triage Notes (Signed)
Patient reports having right upper quad abdominal pain.  Patient report that he does drink alcohol and has been drinking vodka.  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

## 2019-11-07 NOTE — ED Notes (Signed)
Patient sitting in triage wait with mask on the floor. Patient asked to patient mask back on.

## 2019-11-08 ENCOUNTER — Emergency Department
Admission: EM | Admit: 2019-11-08 | Discharge: 2019-11-08 | Disposition: A | Discharge status: Home / Self Care | Payer: Medicaid Other | Attending: Emergency Medicine | Admitting: Emergency Medicine

## 2019-11-08 LAB — COMPREHENSIVE METABOLIC PANEL
ALT: 29 U/L (ref 0–44)
AST: 41 U/L (ref 15–41)
Albumin: 5 g/dL (ref 3.5–5.0)
Alkaline Phosphatase: 45 U/L (ref 38–126)
Anion gap: 13 (ref 5–15)
BUN: 11 mg/dL (ref 6–20)
CO2: 24 mmol/L (ref 22–32)
Calcium: 9.4 mg/dL (ref 8.9–10.3)
Chloride: 103 mmol/L (ref 98–111)
Creatinine, Ser: 0.92 mg/dL (ref 0.61–1.24)
GFR calc Af Amer: >60 mL/min (ref >60)
GFR calc non Af Amer: >60 mL/min (ref >60)
Glucose, Bld: 111 mg/dL — ABNORMAL HIGH (ref 70–99)
Potassium: 4.4 mmol/L (ref 3.5–5.1)
Sodium: 140 mmol/L (ref 135–145)
Total Bilirubin: 0.6 mg/dL (ref 0.3–1.2)
Total Protein: 8.4 g/dL — ABNORMAL HIGH (ref 6.5–8.1)

## 2019-11-08 LAB — LIPASE, BLOOD: Lipase: 35 U/L (ref 11–51)

## 2019-11-08 LAB — TROPONIN I (HIGH SENSITIVITY): Troponin I (High Sensitivity): 14 ng/L (ref <18)

## 2019-11-10 ENCOUNTER — Telehealth: Payer: Self-pay | Admitting: Emergency Medicine

## 2019-11-10 NOTE — Telephone Encounter (Signed)
Called patient due to lwot to inquire about condition and follow up plans. Left message.   

## 2020-01-29 ENCOUNTER — Telehealth: Payer: Self-pay | Admitting: *Deleted

## 2020-01-29 NOTE — Telephone Encounter (Signed)
CALLED PATIENT TO INFORM OF FU FOR 01-30-20 BEING MOVED TO 07-30-20 @ 2:20 PM, LVM FOR A RETURN CALL

## 2020-01-30 ENCOUNTER — Ambulatory Visit: Payer: Medicaid Other | Admitting: Radiation Oncology

## 2020-02-21 ENCOUNTER — Emergency Department
Admission: EM | Admit: 2020-02-21 | Discharge: 2020-02-21 | Disposition: A | Discharge status: Home / Self Care | Payer: Medicaid Other | Attending: Emergency Medicine | Admitting: Emergency Medicine

## 2020-02-21 ENCOUNTER — Other Ambulatory Visit: Payer: Self-pay

## 2020-02-21 DIAGNOSIS — F1094 Alcohol use, unspecified with alcohol-induced mood disorder: Secondary | ICD-10-CM | POA: Diagnosis not present

## 2020-02-21 DIAGNOSIS — E162 Hypoglycemia, unspecified: Secondary | ICD-10-CM

## 2020-02-21 DIAGNOSIS — Z79899 Other long term (current) drug therapy: Secondary | ICD-10-CM | POA: Diagnosis not present

## 2020-02-21 DIAGNOSIS — I10 Essential (primary) hypertension: Secondary | ICD-10-CM | POA: Diagnosis not present

## 2020-02-21 DIAGNOSIS — F17210 Nicotine dependence, cigarettes, uncomplicated: Secondary | ICD-10-CM | POA: Diagnosis not present

## 2020-02-21 DIAGNOSIS — I2510 Atherosclerotic heart disease of native coronary artery without angina pectoris: Secondary | ICD-10-CM | POA: Insufficient documentation

## 2020-02-21 DIAGNOSIS — Z7982 Long term (current) use of aspirin: Secondary | ICD-10-CM | POA: Diagnosis not present

## 2020-02-21 DIAGNOSIS — Z9049 Acquired absence of other specified parts of digestive tract: Secondary | ICD-10-CM | POA: Diagnosis not present

## 2020-02-21 DIAGNOSIS — F10920 Alcohol use, unspecified with intoxication, uncomplicated: Secondary | ICD-10-CM

## 2020-02-21 DIAGNOSIS — Z955 Presence of coronary angioplasty implant and graft: Secondary | ICD-10-CM | POA: Insufficient documentation

## 2020-02-21 DIAGNOSIS — I252 Old myocardial infarction: Secondary | ICD-10-CM | POA: Insufficient documentation

## 2020-02-21 DIAGNOSIS — R45851 Suicidal ideations: Secondary | ICD-10-CM | POA: Diagnosis not present

## 2020-02-21 DIAGNOSIS — F1024 Alcohol dependence with alcohol-induced mood disorder: Secondary | ICD-10-CM | POA: Insufficient documentation

## 2020-02-21 DIAGNOSIS — Z8521 Personal history of malignant neoplasm of larynx: Secondary | ICD-10-CM | POA: Diagnosis not present

## 2020-02-21 DIAGNOSIS — F99 Mental disorder, not otherwise specified: Secondary | ICD-10-CM | POA: Diagnosis present

## 2020-02-21 LAB — COMPREHENSIVE METABOLIC PANEL
ALT: 25 U/L (ref 0–44)
AST: 32 U/L (ref 15–41)
Albumin: 4.5 g/dL (ref 3.5–5.0)
Alkaline Phosphatase: 61 U/L (ref 38–126)
Anion gap: 18 — ABNORMAL HIGH (ref 5–15)
BUN: 12 mg/dL (ref 6–20)
CO2: 19 mmol/L — ABNORMAL LOW (ref 22–32)
Calcium: 9.3 mg/dL (ref 8.9–10.3)
Chloride: 105 mmol/L (ref 98–111)
Creatinine, Ser: 0.76 mg/dL (ref 0.61–1.24)
GFR calc Af Amer: >60 mL/min (ref >60)
GFR calc non Af Amer: >60 mL/min (ref >60)
Glucose, Bld: 54 mg/dL — ABNORMAL LOW (ref 70–99)
Potassium: 3.4 mmol/L — ABNORMAL LOW (ref 3.5–5.1)
Sodium: 142 mmol/L (ref 135–145)
Total Bilirubin: 0.7 mg/dL (ref 0.3–1.2)
Total Protein: 7.6 g/dL (ref 6.5–8.1)

## 2020-02-21 LAB — BASIC METABOLIC PANEL
Anion gap: 9 (ref 5–15)
BUN: 15 mg/dL (ref 6–20)
CO2: 24 mmol/L (ref 22–32)
Calcium: 8.3 mg/dL — ABNORMAL LOW (ref 8.9–10.3)
Chloride: 107 mmol/L (ref 98–111)
Creatinine, Ser: 0.92 mg/dL (ref 0.61–1.24)
GFR calc Af Amer: >60 mL/min (ref >60)
GFR calc non Af Amer: >60 mL/min (ref >60)
Glucose, Bld: 154 mg/dL — ABNORMAL HIGH (ref 70–99)
Potassium: 3.7 mmol/L (ref 3.5–5.1)
Sodium: 140 mmol/L (ref 135–145)

## 2020-02-21 LAB — CBC WITH DIFFERENTIAL/PLATELET
Abs Immature Granulocytes: 0.13 10*3/uL — ABNORMAL HIGH (ref 0.00–0.07)
Basophils Absolute: 0.1 10*3/uL (ref 0.0–0.1)
Basophils Relative: 1 %
Eosinophils Absolute: 0.1 10*3/uL (ref 0.0–0.5)
Eosinophils Relative: 2 %
HCT: 47.5 % (ref 39.0–52.0)
Hemoglobin: 15.8 g/dL (ref 13.0–17.0)
Immature Granulocytes: 1 %
Lymphocytes Relative: 18 %
Lymphs Abs: 1.7 10*3/uL (ref 0.7–4.0)
MCH: 33.1 pg (ref 26.0–34.0)
MCHC: 33.3 g/dL (ref 30.0–36.0)
MCV: 99.4 fL (ref 80.0–100.0)
Monocytes Absolute: 0.7 10*3/uL (ref 0.1–1.0)
Monocytes Relative: 7 %
Neutro Abs: 6.9 10*3/uL (ref 1.7–7.7)
Neutrophils Relative %: 71 %
Platelets: 143 10*3/uL — ABNORMAL LOW (ref 150–400)
RBC: 4.78 MIL/uL (ref 4.22–5.81)
RDW: 12.4 % (ref 11.5–15.5)
WBC: 9.6 10*3/uL (ref 4.0–10.5)
nRBC: 0 % (ref 0.0–0.2)

## 2020-02-21 LAB — GLUCOSE, CAPILLARY: Glucose-Capillary: 40 mg/dL — CL (ref 70–99)

## 2020-02-21 LAB — ACETAMINOPHEN LEVEL: Acetaminophen (Tylenol), Serum: <10 ug/mL — ABNORMAL LOW (ref 10–30)

## 2020-02-21 LAB — SALICYLATE LEVEL: Salicylate Lvl: <7 mg/dL — ABNORMAL LOW (ref 7.0–30.0)

## 2020-02-21 LAB — ETHANOL: Alcohol, Ethyl (B): 267 mg/dL — ABNORMAL HIGH (ref <10)

## 2020-02-21 MED ORDER — DIPHENHYDRAMINE HCL 50 MG/ML IJ SOLN: 50.0000 mg | Freq: Once | INTRAMUSCULAR | Status: AC

## 2020-02-21 MED ORDER — LORAZEPAM 2 MG/ML IJ SOLN
INTRAMUSCULAR | Status: AC
  Administered 2020-02-21: 07:00:00 2 mg via INTRAMUSCULAR
  Filled 2020-02-21: qty 1

## 2020-02-21 MED ORDER — DEXTROSE 50 % IV SOLN
50.0000 mL | Freq: Once | INTRAVENOUS | Status: AC
  Administered 2020-02-21: 50 mL via INTRAVENOUS
  Filled 2020-02-21: qty 50

## 2020-02-21 MED ORDER — HALOPERIDOL LACTATE 5 MG/ML IJ SOLN
INTRAMUSCULAR | Status: AC
  Administered 2020-02-21: 5 mg via INTRAMUSCULAR
  Filled 2020-02-21: qty 1

## 2020-02-21 MED ORDER — SODIUM CHLORIDE 0.9 % IV BOLUS
2000.0000 mL | Freq: Once | INTRAVENOUS | Status: AC
  Administered 2020-02-21: 17:00:00 2000 mL via INTRAVENOUS

## 2020-02-21 MED ORDER — HALOPERIDOL LACTATE 5 MG/ML IJ SOLN: 5.0000 mg | Freq: Once | INTRAMUSCULAR | Status: AC

## 2020-02-21 MED ORDER — LORAZEPAM 2 MG/ML IJ SOLN: 2.0000 mg | Freq: Once | INTRAMUSCULAR | Status: AC

## 2020-02-21 MED ORDER — DIPHENHYDRAMINE HCL 50 MG/ML IJ SOLN
INTRAMUSCULAR | Status: AC
  Administered 2020-02-21: 50 mg via INTRAMUSCULAR
  Filled 2020-02-21: qty 1

## 2020-02-21 NOTE — ED Notes (Signed)
Pt given a soda to drink to help with hypoglycemia.

## 2020-02-21 NOTE — ED Provider Notes (Signed)
  Physical Exam  BP (!) 165/99   Pulse 92   Temp 97.8 F (36.6 C) (Oral)   Resp 16   Ht 5\' 7"  (1.702 m)   Wt 68 kg   SpO2 97%   BMI 23.49 kg/m   Physical Exam  ED Course/Procedures   Clinical Course as of Feb 21 2004  Sat Feb 21, 2020  [place] and psychiatry evaluation pending.  Care is transferred at this time at change of shift to Dr. Jimmye Norman.   [JS]    Clinical Course User Index [JS] Paulette Blanch, MD    Procedures  MDM  Patient is here with suicidal ideation and alcohol intoxication.  Signout pending labs and psych eval.  8:06 PM Psych saw the patient and reversed the IVC.  Patient was hypoglycemic and blood sugar on recheck was 40 initially.  He also had an anion gap of 18 with an alcohol about 267 on arrival.  After 2 L normal saline bolus as well as oral fluids, his glucose on discharge was 154 and his anion gap is normal.  He adamantly denies any toxic alcohol use.  Stable for discharge and will give resources.      Drenda Freeze, MD 02/21/20 2007

## 2020-02-21 NOTE — ED Provider Notes (Signed)
[facility] Emergency Department Provider Note   ____________________________________________   First MD Initiated Contact with Patient 02/21/20 [phone]     (approximate)  I have reviewed the triage vital signs and the nursing notes.   HISTORY  Chief Complaint Psychiatric Evaluation and Abdominal Pain  Level V caveat: Limited by intoxication and agitation  HPI [name] is a 59 y.o. male brought to the ED voluntarily with [facility] department for mental health issues.  Patient intoxicated and belligerent with triage staff.  Attempting to leave the premises.  Emergency IVC initiated for patient safety.       Past Medical History:  Diagnosis Date  . Anxiety   . Bipolar affective ([place])    takes Lithium daily  . Broken fingers   . Coronary artery disease    a. 1997 PCI: RCA ([facility]); b. 2008 PCI: RCA; c. 05/2012 Cath: LCx 100 (previously stented - ? date), RCA stent w/ mild ISR, LAD nonobs, EF 35%; d. 06/2013 Cath: stable anatomy; e. 03/2018 Inf STEMI/PCI: LCX 100o/p ISR (CTO), RCA 130m/d (3.0x38 Resolute Onyx DES), EF 40-45%.   . Depression   . GERD (gastroesophageal reflux disease)    takes Protonix daily  . Hepatitis C    "diagnosed in the past 2 wk" (10/22/2012)  . History of ETOH abuse    quit 2007  . History of radiation therapy 12/17/17- 01/24/18   Larynx treated to 63 gy in 28 fx of 2.25 Gy  . Hyperlipidemia   . Hypertension    "pt. denies - no longer taking blood pressure medications"  . Ischemic cardiomyopathy    a. 03/2018 LV gram: EF 40-45% @ time of MI.  Marland Kitchen Myocardial infarction ([place])    x 4  . PAD (peripheral artery disease) ([place])    a. 2010 PTA & stent to left CIA & left SFA; b. 05/2012 PTA & stent to distal LCIA; c. 10/2012 thrombectomy and stents to [place]; d. 11/2014 s/p Ao-Bifem bypass.  . Schizophrenia ([place])   . Seizures ([place])    "when I get anxious" (Stated last seizure three months ago)  . Tobacco abuse    . Urinary frequency     Patient Active Problem List   Diagnosis Date Noted  . Bipolar disorder current episode depressed ([place]) 08/26/2019  . Coronary artery disease involving native heart with angina pectoris ([place])   . Seizure ([place]) 08/25/2019  . Bipolar affective disorder, depressed, severe ([place]) 08/25/2019  . Chest pain   . Malnutrition of moderate degree 03/09/2018  . STEMI involving oth coronary artery of inferior wall ([place]) 03/07/2018  . 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 Cholecystitis 06/29/2017  . Osteoarthritis of spine with radiculopathy, cervical region 01/26/2017  . Gallbladder polyp 06/01/2016  . Rib pain on right side 06/01/2016  . ETOH abuse 11/24/2014  . Bipolar disorder, currently in remission ([place]) 11/24/2014  . Alcohol dependence with withdrawal with complication ([place]) [id number]  . Chronic hepatitis C ([place]) 09/12/2013  . Cardiomyopathy, ischemic 06/09/2013  . PAD (peripheral artery disease) ([place]) 05/23/2012  . HTN (hypertension) 10/09/2011  . CAROTID BRUIT 12/19/2010  . DYSPNEA 06/15/2010  . Hyperlipidemia 05/06/2010  . TOBACCO USER 05/06/2010  . CAD, NATIVE VESSEL 05/06/2010  . 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 ABDOMINAL AORTAGRAM N/A 10/22/2012   Procedure: ABDOMINAL AORTAGRAM;  Surgeon: Rogue Jury  Ferne Reus, MD;  Location: [place] CATH LAB;  Service: Cardiovascular;  Laterality: N/A;  . ABDOMINAL AORTAGRAM N/A 11/11/2014   Procedure: ABDOMINAL Maxcine Ham;  Surgeon: Wellington Hampshire, MD;  Location: [place] CATH LAB;  Service: Cardiovascular;  Laterality: N/A;  . ANTERIOR CERVICAL DECOMP/DISCECTOMY FUSION  01/2011   plate & screw placement   . ANTERIOR CERVICAL DECOMP/DISCECTOMY FUSION N/A 01/26/2017   Procedure: ANTERIOR CERVICAL DECOMPRESSION/DISCECTOMY FUSION CERVICAL FOUR- CERVICAL FIVE;  Surgeon: Ashok Pall, MD;   Location: [place];  Service: Neurosurgery;  Laterality: N/A;  ANTERIOR CERVICAL DECOMPRESSION/DISCECTOMY FUSION CERVICAL FOUR- CERVICAL FIVE  . AORTA - BILATERAL FEMORAL ARTERY BYPASS GRAFT N/A 11/20/2014   Procedure: AORTA BIFEMORAL BYPASS GRAFT;  Surgeon: Mal Misty, MD;  Location: [place];  Service: Vascular;  Laterality: N/A;  . BACK SURGERY    . BRAIN SURGERY  1989   subdural hematoma  . CARDIAC CATHETERIZATION     Stent - 12  . CHOLECYSTECTOMY N/A 06/29/2017   Procedure: LAPAROSCOPIC CHOLECYSTECTOMY WITH INTRAOPERATIVE CHOLANGIOGRAM, POSSIBLE OPEN;  Surgeon: Robert Bellow, MD;  Location: [facility] ORS;  Service: General;  Laterality: N/A;  . CORONARY ANGIOPLASTY WITH STENT PLACEMENT  03/2007   to RCA  . CORONARY/GRAFT ACUTE MI REVASCULARIZATION N/A 03/07/2018   Procedure: Coronary/Graft Acute MI Revascularization;  Surgeon: Isaias Cowman, MD;  Location: [place] CV LAB;  Service: Cardiovascular;  Laterality: N/A;  . HERNIA REPAIR    . ILIAC ARTERY STENT  10/22/2012   "2" (10/22/2012)  . ILIAC ARTERY STENT  06/2012   left  . INCISIONAL HERNIA REPAIR N/A 11/08/2015   Procedure: INCISIONAL HERNIA REPAIR WITH MYOFASCIAL RELEASE;  Surgeon: Rolm Bookbinder, MD;  Location: [place];  Service: General;  Laterality: N/A;  . INSERTION OF MESH N/A 11/08/2015   Procedure: INSERTION OF MESH;  Surgeon: Rolm Bookbinder, MD;  Location: [place];  Service: General;  Laterality: N/A;  . LEFT HEART CATH AND CORONARY ANGIOGRAPHY N/A 03/07/2018   Procedure: LEFT HEART CATH AND CORONARY ANGIOGRAPHY;  Surgeon: Isaias Cowman, MD;  Location: [place] CV LAB;  Service: Cardiovascular;  Laterality: N/A;  . LEFT HEART CATHETERIZATION WITH CORONARY ANGIOGRAM N/A 05/29/2012   Procedure: LEFT HEART CATHETERIZATION WITH CORONARY ANGIOGRAM;  Surgeon: Wellington Hampshire, MD;  Location: [place] CATH LAB;  Service: Cardiovascular;  Laterality: N/A;  . LIVER BIOPSY  10/2012  . LOWER EXTREMITY ANGIOGRAM N/A  05/29/2012   Procedure: LOWER EXTREMITY ANGIOGRAM;  Surgeon: Wellington Hampshire, MD;  Location: [place] CATH LAB;  Service: Cardiovascular;  Laterality: N/A;  . MICROLARYNGOSCOPY WITH CO2 LASER AND EXCISION OF VOCAL CORD LESION N/A 11/21/2017   Procedure: MICROLARYNGOSCOPY WITH EXCISION OF VOCAL CORD LESION;  Surgeon: Jerrell Belfast, MD;  Location: [place];  Service: ENT;  Laterality: N/A;  . PERCUTANEOUS STENT INTERVENTION Left 05/29/2012   Procedure: PERCUTANEOUS STENT INTERVENTION;  Surgeon: Wellington Hampshire, MD;  Location: [place] CATH LAB;  Service: Cardiovascular;  Laterality: Left;  . POSTERIOR FUSION CERVICAL SPINE  2012  . SUBDURAL HEMATOMA EVACUATION VIA CRANIOTOMY      Prior to Admission medications   Medication Sig Start Date End Date Taking? Authorizing Provider  aspirin EC 81 MG EC tablet Take 1 tablet (81 mg total) by mouth daily. 08/29/19   Clapacs, Madie Reno, MD  atorvastatin (LIPITOR) 40 MG tablet Take 1 tablet (40 mg total) by mouth daily at 6 PM. 08/28/19   Clapacs, Madie Reno, MD  cariprazine 4.5 MG CAPS Take 1 capsule (4.5 mg total) by mouth daily. 08/28/19  Clapacs, Madie Reno, MD  carvedilol (COREG) 3.125 MG tablet Take 1 tablet (3.125 mg total) by mouth 2 (two) times daily with a meal. 08/28/19   Clapacs, Madie Reno, MD  divalproex (DEPAKOTE ER) 500 MG 24 hr tablet Take 2 tablets (1,000 mg total) by mouth at bedtime. 08/28/19   Clapacs, Madie Reno, MD  irbesartan (AVAPRO) 150 MG tablet Take 1 tablet (150 mg total) by mouth daily. 08/29/19   Clapacs, Madie Reno, MD  lithium carbonate (ESKALITH) 450 MG CR tablet Take 1 tablet (450 mg total) by mouth every 12 (twelve) hours. 08/28/19   Clapacs, Madie Reno, MD  pantoprazole (PROTONIX) 40 MG tablet Take 1 tablet (40 mg total) by mouth daily at 12 noon. 08/28/19   Clapacs, Madie Reno, MD  thiamine 100 MG tablet Take 1 tablet (100 mg total) by mouth daily. 08/29/19   Clapacs, Madie Reno, MD  ticagrelor (BRILINTA) 60 MG TABS tablet Take 1 tablet (60 mg total) by mouth 2 (two) times  daily. 08/28/19   Clapacs, Madie Reno, MD    Allergies Lisinopril  Family History  Problem Relation Age of Onset  . Emphysema Father   . COPD Mother   . Diabetes Other   . Alcohol abuse Other   . Hypertension Other   . Hyperlipidemia Other   . Seizures Other     Social History Social History   Tobacco Use  . Smoking status: Current Every Day Smoker    Packs/day: 2.00    Years: 40.00    Pack years: 80.00    Types: Cigarettes  . Smokeless tobacco: Never Used  . Tobacco comment: "ive slowed down recently"   Substance Use Topics  . Alcohol use: Yes    Alcohol/week: 0.0 standard drinks    Comment: 1/2 gallon or 20 beers daily  . Drug use: Yes    Types: Marijuana    Comment: last smoked 1 month ago    Review of Systems  Constitutional: No fever/chills Eyes: No visual changes. ENT: No sore throat. Cardiovascular: Denies chest pain. Respiratory: Denies shortness of breath. Gastrointestinal: No abdominal pain.  No nausea, no vomiting.  No diarrhea.  No constipation. Genitourinary: Negative for dysuria. Musculoskeletal: Negative for back pain. Skin: Negative for rash. Neurological: Negative for headaches, focal weakness or numbness. Psychiatric: Positive for suicidal ideation.  ____________________________________________   PHYSICAL EXAM:  VITAL SIGNS: ED Triage Vitals  Enc Vitals Group     BP 02/21/20 0640 (!) 165/99     Pulse Rate 02/21/20 0640 92     Resp 02/21/20 0640 16     Temp 02/21/20 0640 97.8 F (36.6 C)     Temp Source 02/21/20 0640 Oral     SpO2 02/21/20 0640 97 %     Weight 02/21/20 0641 150 lb (68 kg)     Height 02/21/20 0641 5\' 7"  (1.702 m)     Head Circumference --      Peak Flow --      Pain Score 02/21/20 0641 8     Pain Loc --      Pain Edu? --      Excl. in [place]? --     Constitutional: Alert and oriented.  Intoxicated appearing and in moderate acute distress.  Agitated, yelling, aggressive, trying to punch Animal nutritionist. Eyes:  Conjunctivae are normal. PERRL. EOMI. Head: Atraumatic. Nose: Atraumatic. Mouth/Throat: Mucous membranes are moist.   Neck: No stridor.   Cardiovascular: Normal rate, regular rhythm. Grossly normal heart sounds.  Good peripheral  circulation. Respiratory: Normal respiratory effort.  No retractions. Lungs CTAB. Gastrointestinal: Soft and nontender. No distention. No abdominal bruits. No CVA tenderness. Musculoskeletal: No lower extremity tenderness nor edema.  No joint effusions. Neurologic:  Normal speech and language. No gross focal neurologic deficits are appreciated.  Skin:  Skin is warm, dry and intact. No rash noted. Psychiatric: Mood and affect are agitated, combative, belligerent. Speech and behavior are agitated, combative, belligerent.  ____________________________________________   LABS (all labs ordered are listed, but only abnormal results are displayed)  Labs Reviewed  CBC WITH DIFFERENTIAL/PLATELET  COMPREHENSIVE METABOLIC PANEL  ETHANOL  ACETAMINOPHEN LEVEL  SALICYLATE LEVEL  URINE DRUG SCREEN, QUALITATIVE ([place])   ____________________________________________  EKG  None ____________________________________________  RADIOLOGY  ED MD interpretation: None  Official radiology report(s): No results found.  ____________________________________________   PROCEDURES  Procedure(s) performed (including Critical Care):  Procedures   ____________________________________________   INITIAL IMPRESSION / ASSESSMENT AND PLAN / ED COURSE  As part of my medical decision making, I reviewed the following data within the [facility] notes reviewed and incorporated, Old chart reviewed and Notes from prior ED visits     DINK TILTON was evaluated in Emergency Department on 02/21/2020 for the symptoms described in the history of present illness. He was evaluated in the context of the global COVID-19 pandemic, which necessitated  consideration that the patient might be at risk for infection with the SARS-CoV-2 virus that causes COVID-19. Institutional protocols and algorithms that pertain to the evaluation of patients at risk for COVID-19 are in a state of rapid change based on information released by regulatory bodies including the CDC and federal and state organizations. These policies and algorithms were followed during the patient's care in the ED.    59 year old intoxicated male initially brought under voluntary status for suicidal ideation.  Patient quickly escalated his behavior in triage, necessitating emergency IVC papers.  He was brought back immediately to the treatment room where he is trying to punch the Animal nutritionist and staff.  Patient is unable to be verbally redirected and requires IM calming agents.  Clinical Course as of Feb 20 709  Sat Feb 21, 2020  0710 Labs and psychiatry evaluation pending.  Care is transferred at this time at change of shift to Dr. Jimmye Norman.   [JS]    Clinical Course User Index [JS] Paulette Blanch, MD     ____________________________________________   FINAL CLINICAL IMPRESSION(S) / ED DIAGNOSES  Final diagnoses:  Alcoholic intoxication without complication [facility])  Suicidal ideation     ED Discharge Orders    None       Note:  This document was prepared using Dragon voice recognition software and may include unintentional dictation errors.   Paulette Blanch, MD 02/21/20 [phone]

## 2020-02-21 NOTE — ED Notes (Signed)
Pt stumbling in triage, throwing his phone. Pt refusing to cooperate and appears intoxicated and states is suicidal and wants to leave. md sung notified, emergency IVC papers in progress.

## 2020-02-21 NOTE — ED Notes (Signed)
Pt notified that after his 2nd liter of fluids that we will repeat his labwork. Pt given an estimate of 1030 to 2000 of d/c. Pt given the phone to speak with family.

## 2020-02-21 NOTE — Discharge Instructions (Addendum)
Stay hydrated   Your blood sugar was low from drinking too much alcohol and not eating enough.  See your doctor.  Return to ER if you have thoughts of harming herself or others, hallucinations.

## 2020-02-21 NOTE — ED Notes (Signed)
Pt. Laying in [place] bed sleeping.  Pt. Woke and asked to allow this nurse to draw blood for repeat labs.  Pt. Allowed this nurse to straight stick for blood work.  Pt. Told the reason for blood draw.  Pt. Calm and cooperative at this time.

## 2020-02-21 NOTE — ED Notes (Signed)
Pt given a dinner tray to eat.

## 2020-02-21 NOTE — BH Assessment (Signed)
Assessment Note  [name] is a 59 y.o. male who was brought to [facility] via the [facility] due to pt drinking too much, stating he is sick, and sharing he is experiencing SI. According to notes, pt was belligerent and threatening upon arrival and attempted to punch hospital/security staff and also attempted to leave; he was given medication to help him relax and was IVCed. Pt has been asleep since that medication was given to him around 0700 this morning.  Upon pt waking up, he states he drank more than usual (a case of beer instead of a pack of beer/24 12-ounce beers instead of 12 12-ounce beers). Pt declines he is experiencing SI, HI, AVH, or that he has/is engaging in NSSIB. Pt declines any access to guns/weapons, any engagement with the legal system, or that he uses any substances other than EtOH. Pt expressed feeling he does not need resources at this time.  Pt's protective factors include a steady living environment and a lack of HI and AVH.  Pt is oriented x4. His recent and remote memory is intact. Pt was cooperative throughout the assessment process. Pt's insight, judgement, and impulse control is fair at this time.   Diagnosis: F10.24, Alcohol dependence with alcohol-induced mood disorder   Past Medical History:  Past Medical History:  Diagnosis Date  . Anxiety   . Bipolar affective ([place])    takes Lithium daily  . Broken fingers   . Coronary artery disease    a. 1997 PCI: RCA ([facility]); b. 2008 PCI: RCA; c. 05/2012 Cath: LCx 100 (previously stented - ? date), RCA stent w/ mild ISR, LAD nonobs, EF 35%; d. 06/2013 Cath: stable anatomy; e. 03/2018 Inf STEMI/PCI: LCX 100o/p ISR (CTO), RCA 136md (3.0x38 Resolute Onyx DES), EF 40-45%.   . Depression   . GERD (gastroesophageal reflux disease)    takes Protonix daily  . Hepatitis C    "diagnosed in the past 2 wk" (10/22/2012)  . History of ETOH abuse    quit 2007  . History of radiation therapy 12/17/17- 01/24/18   Larynx treated to 63 gy in  28 fx of 2.25 Gy  . Hyperlipidemia   . Hypertension    "pt. denies - no longer taking blood pressure medications"  . Ischemic cardiomyopathy    a. 03/2018 LV gram: EF 40-45% @ time of MI.  .Marland KitchenMyocardial infarction ([place]    x 4  . PAD (peripheral artery disease) ([place]    a. 2010 PTA & stent to left CIA & left SFA; b. 05/2012 PTA & stent to distal LCIA; c. 10/2012 [place] d. 11/2014 s/p Ao-Bifem bypass.  . Schizophrenia ([place]   . Seizures ([place]    "when I get anxious" (Stated last seizure three months ago)  . Tobacco abuse   . Urinary frequency     Past Surgical History:  Procedure Laterality Date  . ABDOMINAL AORTAGRAM N/A 10/22/2012   Procedure: ABDOMINAL AMaxcine Ham  Surgeon: MWellington Hampshire MD;  [facility];  Service: Cardiovascular;  Laterality: N/A;  . ABDOMINAL AORTAGRAM N/A 11/11/2014   Procedure: ABDOMINAL AMaxcine Ham  Surgeon: MWellington Hampshire MD;  [place] [facility];  Service: Cardiovascular;  Laterality: N/A;  . ANTERIOR CERVICAL DECOMP/DISCECTOMY FUSION  01/2011   plate & screw placement   . ANTERIOR CERVICAL DECOMP/DISCECTOMY FUSION N/A 01/26/2017   Procedure: ANTERIOR CERVICAL DECOMPRESSION/DISCECTOMY FUSION CERVICAL FOUR- CERVICAL FIVE;  Surgeon: KAshok Pall MD;  [place]  Service: Neurosurgery;  Laterality: N/A;  ANTERIOR CERVICAL DECOMPRESSION/DISCECTOMY FUSION CERVICAL FOUR- CERVICAL FIVE  . AORTA - BILATERAL FEMORAL ARTERY BYPASS GRAFT N/A 11/20/2014   Procedure: AORTA BIFEMORAL BYPASS GRAFT;  Surgeon: Mal Misty, MD;  Location: [place];  Service: Vascular;  Laterality: N/A;  . BACK SURGERY    . BRAIN SURGERY  1989   subdural hematoma  . CARDIAC CATHETERIZATION     Stent - 12  . CHOLECYSTECTOMY N/A 06/29/2017   Procedure: LAPAROSCOPIC CHOLECYSTECTOMY WITH INTRAOPERATIVE CHOLANGIOGRAM, POSSIBLE OPEN;  Surgeon: Robert Bellow, MD;  Location: [facility];  Service: General;  Laterality: N/A;  . CORONARY  ANGIOPLASTY WITH STENT PLACEMENT  03/2007   to RCA  . CORONARY/GRAFT ACUTE MI REVASCULARIZATION N/A 03/07/2018   Procedure: Coronary/Graft Acute MI Revascularization;  Surgeon: Isaias Cowman, MD;  Location: [place] CV LAB;  Service: Cardiovascular;  Laterality: N/A;  . HERNIA REPAIR    . ILIAC ARTERY STENT  10/22/2012   "2" (10/22/2012)  . ILIAC ARTERY STENT  06/2012   left  . INCISIONAL HERNIA REPAIR N/A 11/08/2015   Procedure: INCISIONAL HERNIA REPAIR WITH MYOFASCIAL RELEASE;  Surgeon: Rolm Bookbinder, MD;  Location: [place];  Service: General;  Laterality: N/A;  . INSERTION OF MESH N/A 11/08/2015   Procedure: INSERTION OF MESH;  Surgeon: Rolm Bookbinder, MD;  Location: [place];  Service: General;  Laterality: N/A;  . LEFT HEART CATH AND CORONARY ANGIOGRAPHY N/A 03/07/2018   Procedure: LEFT HEART CATH AND CORONARY ANGIOGRAPHY;  Surgeon: Isaias Cowman, MD;  Location: [place] CV LAB;  Service: Cardiovascular;  Laterality: N/A;  . LEFT HEART CATHETERIZATION WITH CORONARY ANGIOGRAM N/A 05/29/2012   Procedure: LEFT HEART CATHETERIZATION WITH CORONARY ANGIOGRAM;  Surgeon: Wellington Hampshire, MD;  Location: [place] CATH LAB;  Service: Cardiovascular;  Laterality: N/A;  . LIVER BIOPSY  10/2012  . LOWER EXTREMITY ANGIOGRAM N/A 05/29/2012   Procedure: LOWER EXTREMITY ANGIOGRAM;  Surgeon: Wellington Hampshire, MD;  Location: [place] CATH LAB;  Service: Cardiovascular;  Laterality: N/A;  . MICROLARYNGOSCOPY WITH CO2 LASER AND EXCISION OF VOCAL CORD LESION N/A 11/21/2017   Procedure: MICROLARYNGOSCOPY WITH EXCISION OF VOCAL CORD LESION;  Surgeon: Jerrell Belfast, MD;  Location: [place];  Service: ENT;  Laterality: N/A;  . PERCUTANEOUS STENT INTERVENTION Left 05/29/2012   Procedure: PERCUTANEOUS STENT INTERVENTION;  Surgeon: Wellington Hampshire, MD;  Location: [place] CATH LAB;  Service: Cardiovascular;  Laterality: Left;  . POSTERIOR FUSION CERVICAL SPINE  2012  . SUBDURAL HEMATOMA EVACUATION VIA CRANIOTOMY       Family History:  Family History  Problem Relation Age of Onset  . Emphysema Father   . COPD Mother   . Diabetes Other   . Alcohol abuse Other   . Hypertension Other   . Hyperlipidemia Other   . Seizures Other     Social History:  reports that he has been smoking cigarettes. He has a 80.00 pack-year smoking history. He has never used smokeless tobacco. He reports current alcohol use. He reports current drug use. Drug: Marijuana.  Additional Social History:  Alcohol / Drug Use Pain Medications: Please see MAR Prescriptions: Please see MAR Over the Counter: Please see MAR History of alcohol / drug use?: Yes Longest period of sobriety (when/how long): Unknown Substance #1 Name of Substance 1: EtOH 1 - Age of First Use: Unknown 1 - Amount (size/oz): 12 12-ounce beers 1 - Frequency: Several time a week 1 - Duration: Unknown 1 - Last Use / Amount: Last night drank a case (24-pack)  of 12-ounce beers  CIWA: CIWA-Ar BP: (!) 165/99 Pulse Rate: 92 COWS:    Allergies:  Allergies  Allergen Reactions  . Lisinopril Other (See Comments)    Other reaction(s): Elevated creatine and very elevated potassium     Home Medications: (Not in a hospital admission)   OB/GYN Status:  No LMP for male patient.  General Assessment Data Location of Assessment: [facility] ED TTS Assessment: In system Is this a Tele or Face-to-Face Assessment?: Face-to-Face Is this an Initial Assessment or a Re-assessment for this encounter?: Initial Assessment Patient Accompanied by:: N/A Language Other than English: No Living Arrangements: Other (Comment)(Pt lives w/ roommates) What gender do you identify as?: Male Marital status: Single Living Arrangements: Non-relatives/Friends Can pt return to current living arrangement?: Yes Admission Status: Involuntary Petitioner: ED Attending Is patient capable of signing voluntary admission?: No Referral Source: Other(Police) Insurance type: Medicaid      Crisis Care Plan Living Arrangements: Non-relatives/Friends Legal Guardian: Other:(Self) Name of Psychiatrist: Unknown Name of Therapist: None  Education Status Is patient currently in school?: No Is the patient employed, unemployed or receiving disability?: Unemployed  Risk to self with the past 6 months Suicidal Ideation: No Has patient been a risk to self within the past 6 months prior to admission? : No Suicidal Intent: No Has patient had any suicidal intent within the past 6 months prior to admission? : No Is patient at risk for suicide?: No Suicidal Plan?: No Has patient had any suicidal plan within the past 6 months prior to admission? : No Access to Means: No What has been your use of drugs/alcohol within the last 12 months?: Pt acknowledges EtOH use Previous Attempts/Gestures: No How many times?: 0 Other Self Harm Risks: None noted Triggers for Past Attempts: None known Intentional Self Injurious Behavior: None Family Suicide History: Unable to assess Recent stressful life event(s): Other (Comment)(Illness) Persecutory voices/beliefs?: No Depression: Yes Depression Symptoms: Fatigue, Guilt, Loss of interest in usual pleasures, Feeling worthless/self pity, Feeling angry/irritable Substance abuse history and/or treatment for substance abuse?: Yes Suicide prevention information given to non-admitted patients: (Pt declined the information clinician offered)  Risk to Others within the past 6 months Homicidal Ideation: No Does patient have any lifetime risk of violence toward others beyond the six months prior to admission? : No Thoughts of Harm to Others: No Current Homicidal Intent: No Current Homicidal Plan: No Access to Homicidal Means: No Identified Victim: None noted History of harm to others?: No Assessment of Violence: None Noted Violent Behavior Description: None noted Does patient have access to weapons?: No(Pt denied access to guns/weapons) Criminal  Charges Pending?: No Does patient have a court date: No Is patient on probation?: No  Psychosis Hallucinations: None noted Delusions: None noted  Mental Status Report Appearance/Hygiene: Disheveled, In scrubs Eye Contact: Fair Motor Activity: Unremarkable, Other (Comment)(Pt is sitting up in his hospital bed) Speech: Soft, Unremarkable, Other (Comment)(Pt talks minimally) Level of Consciousness: Quiet/awake Mood: Worthless, low self-esteem Affect: Appropriate to circumstance Anxiety Level: Minimal Thought Processes: Coherent Judgement: Partial Orientation: Person, Place, Time, Situation Obsessive Compulsive Thoughts/Behaviors: None  Cognitive Functioning Concentration: Normal Memory: Recent Intact, Remote Intact Is patient IDD: No Insight: Fair Impulse Control: Fair Appetite: Fair Have you had any weight changes? : No Change Sleep: No Change Total Hours of Sleep: 6 Vegetative Symptoms: None  ADLScreening [facility] Assessment Services) Patient's cognitive ability adequate to safely complete daily activities?: Yes Patient able to express need for assistance with ADLs?: Yes Independently performs ADLs?: Yes (appropriate for  developmental age)  Prior Inpatient Therapy Prior Inpatient Therapy: Yes Prior Therapy Dates: 08/2019 Prior Therapy Facilty/Provider(s): [facility] Reason for Treatment: SI, EtOH abuse  Prior Outpatient Therapy Prior Outpatient Therapy: No Does patient have an ACCT team?: No Does patient have Intensive In-House Services?  : No Does patient have [facility] services? : No Does patient have P4CC services?: No  ADL Screening (condition at time of admission) Patient's cognitive ability adequate to safely complete daily activities?: Yes Is the patient deaf or have difficulty hearing?: No Does the patient have difficulty seeing, even when wearing glasses/contacts?: No Does the patient have difficulty concentrating, remembering, or making decisions?: No Patient  able to express need for assistance with ADLs?: Yes Does the patient have difficulty dressing or bathing?: No Independently performs ADLs?: Yes (appropriate for developmental age) Does the patient have difficulty walking or climbing stairs?: No Weakness of Legs: None Weakness of Arms/Hands: None  Home Assistive Devices/Equipment Home Assistive Devices/Equipment: None  Therapy Consults (therapy consults require a physician order) PT Evaluation Needed: No OT Evalulation Needed: No SLP Evaluation Needed: No Abuse/Neglect Assessment (Assessment to be complete while patient is alone) Abuse/Neglect Assessment Can Be Completed: Unable to assess, patient is non-responsive or altered mental status Values / Beliefs Cultural Requests During Hospitalization: (UTA) Spiritual Requests During Hospitalization: (UTA) [place] Needed: (UTA) Transition of Care Team Consult Needed: (UTA) Advance Directives (For Healthcare) Does Patient Have a Medical Advance Directive?: No Would patient like information on creating a medical advance directive?: No - Patient declined           Disposition: Marvia Pickles, NP, reviewed pt's chart and information and met w/ pt and determined pt can be psych cleared. Pt declined otpt referral information. This information was provided to pt's nurse and EDP at 1710.   Disposition Initial Assessment Completed for this Encounter: Yes Patient referred to: Other (Comment)(Pt has been d/c & is encouraged to f/u w/ his provider)  On Site Evaluation by:   Reviewed with Physician:    Dannielle Burn 02/21/2020 6:10 PM

## 2020-02-21 NOTE — ED Notes (Addendum)
Pt has cell phone in belongings bag along with clothes and jacket. EDT Scott removed ring from left hand and socks ,and placed in belonging bag as well.

## 2020-02-21 NOTE — ED Notes (Signed)
Pt brought to [place] via wheelchair. Pt attempting to leave and is stumbling around. Dr Beather Arbour to bedside to see pt and VO for haldol 5mg , Benadryl 50 mg and ativan 2 mg IM. Will obtain medications for pt.

## 2020-02-21 NOTE — ED Notes (Signed)
Pt. Called friend who is waiting for him in parking lot.

## 2020-02-21 NOTE — Consult Note (Signed)
[facility] Consult   Reason for Consult:  Alcohol abuse and "mental health problems" Referring Physician:  EDP Patient Identification: [name] MRN:  [id number] Principal Diagnosis: Alcohol-induced mood disorder ([name]) Diagnosis:  Principal Problem:   Alcohol-induced mood disorder ([name])   Total Time spent with patient: 45 minutes  Subjective:   [name] is a 59 y.o. male patient reports that he is feeling much better now.  Patient states that yesterday he started drinking and for some unknown reason he stated that he drank almost a case of beer.  He states that he does not normally drink and his last drink was approximately 5 days ago.  Patient was asked about his mental health problems that he reported upon admission to the ED and he stated that he is fine.  Patient denies any suicidal or homicidal ideations and denies any hallucinations.  He reports that he is compliant with his medications and also with his treatment with his psychiatrist in [name].  Patient states that he does have some depression but that is normal for him as he has dealt with it for years.  He reports that he just drank too much beer yesterday and was unaware of the things that he was saying or doing yesterday and this morning.  HPI:  Per EDP: 59 y.o. male brought to the ED voluntarily with [facility] department for mental health issues.  Patient intoxicated and belligerent with triage staff.  Attempting to leave the premises.  Emergency IVC initiated for patient safety.  Patient is seen by this provider via face-to-face.  Patient is seen lying in bed and is pleasant, calm, cooperative.  Patient is receiving fluids at the moment due to abnormal labs and his blood sugar being at 40.  Patient's BAL upon admission to the ED was 267.  Patient has slept since this morning and did not wake up until approximately 430-5:00 PM.  Patient has denied any suicidal homicidal ideations and  denies any hallucinations.  Patient was intoxicated when he arrived at the hospital was agitated received IM injections for his agitation.  Patient reports that he feels that he is ready to go home and that he will continue his follow-up with his outpatient psychiatrist as well as continue his current medications.  Patient is encouraged not to drink excessively and he states agreement and understanding.  At this time the patient does not meet inpatient criteria and is psychiatrically cleared.  I have rescinded the patient's IVC and Dr. Darl Householder has been notified of the recommendations.  Past Psychiatric History: Alcohol abuse, bipolar disorder.  Previous hospitalizations with last known in September 2020 at [facility].  Reports current with medications as well as with his psychiatrist in [name].  Risk to Self:   Risk to Others:   Prior Inpatient Therapy:   Prior Outpatient Therapy:    Past Medical History:  Past Medical History:  Diagnosis Date  . Anxiety   . Bipolar affective ([name])    takes Lithium daily  . Broken fingers   . Coronary artery disease    a. 1997 PCI: RCA ([facility]); b. 2008 PCI: RCA; c. 05/2012 Cath: LCx 100 (previously stented - ? date), RCA stent w/ mild ISR, LAD nonobs, EF 35%; d. 06/2013 Cath: stable anatomy; e. 03/2018 Inf STEMI/PCI: LCX 100o/p ISR (CTO), RCA 161m/d (3.0x38 Resolute Onyx DES), EF 40-45%.   . Depression   . GERD (gastroesophageal reflux disease)    takes Protonix daily  . Hepatitis C    "  diagnosed in the past 2 wk" (10/22/2012)  . History of ETOH abuse    quit 2007  . History of radiation therapy 12/17/17- 01/24/18   Larynx treated to 63 gy in 28 fx of 2.25 Gy  . Hyperlipidemia   . Hypertension    "pt. denies - no longer taking blood pressure medications"  . Ischemic cardiomyopathy    a. 03/2018 LV gram: EF 40-45% @ time of MI.  Marland Kitchen Myocardial infarction ([place])    x 4  . PAD (peripheral artery disease) ([name])    a. 2010 PTA & stent to left CIA & left SFA; b. 05/2012  PTA & stent to distal LCIA; c. 10/2012 thrombectomy and stents to [name]; d. 11/2014 s/p Ao-Bifem bypass.  . Schizophrenia ([name])   . Seizures ([name])    "when I get anxious" (Stated last seizure three months ago)  . Tobacco abuse   . Urinary frequency     Past Surgical History:  Procedure Laterality Date  . ABDOMINAL AORTAGRAM N/A 10/22/2012   Procedure: ABDOMINAL Maxcine Ham;  Surgeon: [place] Hampshire, MD;  Location: [name];  Service: Cardiovascular;  Laterality: N/A;  . ABDOMINAL AORTAGRAM N/A 11/11/2014   Procedure: ABDOMINAL Maxcine Ham;  Surgeon: [place], MD;  [facility]: [name];  Service: Cardiovascular;  Laterality: N/A;  . ANTERIOR CERVICAL DECOMP/DISCECTOMY FUSION  01/2011   plate & screw placement   . ANTERIOR CERVICAL DECOMP/DISCECTOMY FUSION N/A 01/26/2017   Procedure: ANTERIOR CERVICAL DECOMPRESSION/DISCECTOMY FUSION CERVICAL FOUR- CERVICAL FIVE;  Surgeon: [place] Pall, MD;  Location: [name];  Service: Neurosurgery;  Laterality: N/A;  ANTERIOR CERVICAL DECOMPRESSION/DISCECTOMY FUSION CERVICAL FOUR- CERVICAL FIVE  . AORTA - BILATERAL FEMORAL ARTERY BYPASS GRAFT N/A 11/20/2014   Procedure: AORTA BIFEMORAL BYPASS GRAFT;  Surgeon: Mal Misty, MD;  Location: [name];  Service: Vascular;  Laterality: N/A;  . BACK SURGERY    . BRAIN SURGERY  1989   subdural hematoma  . CARDIAC CATHETERIZATION     Stent - 12  . CHOLECYSTECTOMY N/A 06/29/2017   Procedure: LAPAROSCOPIC CHOLECYSTECTOMY WITH INTRAOPERATIVE CHOLANGIOGRAM, POSSIBLE OPEN;  Surgeon: [place] Bellow, MD;  Location: [name];  Service: General;  Laterality: N/A;  . CORONARY ANGIOPLASTY WITH STENT PLACEMENT  03/2007   to RCA  . CORONARY/GRAFT ACUTE MI REVASCULARIZATION N/A 03/07/2018   Procedure: Coronary/Graft Acute MI Revascularization;  Surgeon: Isaias Cowman, MD;  Location: [name];  Service: Cardiovascular;  Laterality: N/A;  . HERNIA REPAIR    . ILIAC ARTERY STENT  10/22/2012   "2"  (10/22/2012)  . ILIAC ARTERY STENT  06/2012   left  . INCISIONAL HERNIA REPAIR N/A 11/08/2015   Procedure: INCISIONAL HERNIA REPAIR WITH MYOFASCIAL RELEASE;  Surgeon: Rolm Bookbinder, MD;  Location: [facility];  Service: General;  Laterality: N/A;  . INSERTION OF MESH N/A 11/08/2015   Procedure: INSERTION OF MESH;  Surgeon: Rolm Bookbinder, MD;  Location: [place];  Service: General;  Laterality: N/A;  . LEFT HEART CATH AND CORONARY ANGIOGRAPHY N/A 03/07/2018   Procedure: LEFT HEART CATH AND CORONARY ANGIOGRAPHY;  Surgeon: Isaias Cowman, MD;  Location: [name];  Service: Cardiovascular;  Laterality: N/A;  . LEFT HEART CATHETERIZATION WITH CORONARY ANGIOGRAM N/A 05/29/2012   Procedure: LEFT HEART CATHETERIZATION WITH CORONARY ANGIOGRAM;  Surgeon: [place], MD;  Location: [name];  Service: Cardiovascular;  Laterality: N/A;  . LIVER BIOPSY  10/2012  . LOWER EXTREMITY ANGIOGRAM N/A 05/29/2012   Procedure: LOWER EXTREMITY ANGIOGRAM;  Surgeon: [place], MD;  Location[name];  Service: Cardiovascular;  Laterality: N/A;  . MICROLARYNGOSCOPY WITH CO2 LASER AND EXCISION OF VOCAL CORD LESION N/A 11/21/2017   Procedure: MICROLARYNGOSCOPY WITH EXCISION OF VOCAL CORD LESION;  Surgeon: Jerrell Belfast, MD;  Location: [place];  Service: ENT;  Laterality: N/A;  . PERCUTANEOUS STENT INTERVENTION Left 05/29/2012   Procedure: PERCUTANEOUS STENT INTERVENTION;  Surgeon: [place], MD;  Location: [facility] [place] [name];  Service: Cardiovascular;  Laterality: Left;  . POSTERIOR FUSION CERVICAL SPINE  2012  . SUBDURAL HEMATOMA EVACUATION VIA CRANIOTOMY     Family History:  Family History  Problem Relation Age of Onset  . Emphysema Father   . COPD Mother   . Diabetes Other   . Alcohol abuse Other   . Hypertension Other   . Hyperlipidemia Other   . Seizures Other    Family Psychiatric  History: None reported Social History:  Social History   Substance and Sexual Activity   Alcohol Use Yes  . Alcohol/week: 0.0 standard drinks   Comment: 1/2 gallon or 20 beers daily     Social History   Substance and Sexual Activity  Drug Use Yes  . Types: Marijuana   Comment: last smoked 1 month ago    Social History   Socioeconomic History  . Marital status: Divorced    Spouse name: Not on file  . Number of children: Not on file  . Years of education: Not on file  . Highest education level: Not on file  Occupational History  . Occupation: Disabled    Employer: UNEMPLOYED  Tobacco Use  . Smoking status: Current Every Day Smoker    Packs/day: 2.00    Years: 40.00    Pack years: 80.00    Types: Cigarettes  . Smokeless tobacco: Never Used  . Tobacco comment: "ive slowed down recently"   Substance and Sexual Activity  . Alcohol use: Yes    Alcohol/week: 0.0 standard drinks    Comment: 1/2 gallon or 20 beers daily  . Drug use: Yes    Types: Marijuana    Comment: last smoked 1 month ago  . Sexual activity: Yes  Other Topics Concern  . Not on file  Social History Narrative   17 years Bipolar/ADHD.   Pt gets regular exercise.   Social Determinants of Health   Financial Resource Strain:   . Difficulty of Paying Living Expenses:   Food Insecurity:   . Worried About Charity fundraiser in the Last Year:   . Arboriculturist in the Last Year:   Transportation Needs:   . Film/video editor (Medical):   Marland Kitchen Lack of Transportation (Non-Medical):   Physical Activity:   . Days of Exercise per Week:   . Minutes of Exercise per Session:   Stress:   . Feeling of Stress :   Social Connections:   . Frequency of Communication with Friends and Family:   . Frequency of Social Gatherings with Friends and Family:   . Attends Religious Services:   . Active Member of Clubs or Organizations:   . Attends Archivist Meetings:   Marland Kitchen Marital Status:    Additional Social History:    Allergies:   Allergies  Allergen Reactions  . Lisinopril Other (See  Comments)    Other reaction(s): Elevated creatine and very elevated potassium     Labs:  Results for orders placed or performed during the hospital encounter of 02/21/20 (from the  past 48 hour(s))  CBC with Differential     Status: Abnormal   Collection Time: 02/21/20  7:44 AM  Result Value Ref Range   WBC 9.6 4.0 - 10.5 K/uL   RBC 4.78 4.22 - 5.81 MIL/uL   Hemoglobin 15.8 13.0 - 17.0 g/dL   HCT 47.5 39.0 - 52.0 %   MCV 99.4 80.0 - 100.0 fL   MCH 33.1 26.0 - 34.0 pg   MCHC 33.3 30.0 - 36.0 g/dL   RDW 12.4 11.5 - 15.5 %   Platelets 143 (L) 150 - 400 K/uL   nRBC 0.0 0.0 - 0.2 %   Neutrophils Relative % 71 %   Neutro Abs 6.9 1.7 - 7.7 K/uL   Lymphocytes Relative 18 %   Lymphs Abs 1.7 0.7 - 4.0 K/uL   Monocytes Relative 7 %   Monocytes Absolute 0.7 0.1 - 1.0 K/uL   Eosinophils Relative 2 %   Eosinophils Absolute 0.1 0.0 - 0.5 K/uL   Basophils Relative 1 %   Basophils Absolute 0.1 0.0 - 0.1 K/uL   Immature Granulocytes 1 %   Abs Immature Granulocytes 0.13 (H) 0.00 - 0.07 K/uL    Comment: Performed at [facility], [address] [place], [place] [address]  Comprehensive metabolic panel     Status: Abnormal   Collection Time: 02/21/20  7:44 AM  Result Value Ref Range   Sodium 142 135 - 145 mmol/L   Potassium 3.4 (L) 3.5 - 5.1 mmol/L   Chloride 105 98 - 111 mmol/L   CO2 19 (L) 22 - 32 mmol/L   Glucose, Bld 54 (L) 70 - 99 mg/dL    Comment: Glucose reference range applies only to samples taken after fasting for at least 8 hours.   BUN 12 6 - 20 mg/dL   Creatinine, Ser 0.76 0.61 - 1.24 mg/dL   Calcium 9.3 8.9 - 10.3 mg/dL   Total Protein 7.6 6.5 - 8.1 g/dL   Albumin 4.5 3.5 - 5.0 g/dL   AST 32 15 - 41 U/L   ALT 25 0 - 44 U/L   Alkaline Phosphatase 61 38 - 126 U/L   Total Bilirubin 0.7 0.3 - 1.2 mg/dL   GFR calc non Af Amer >60 >60 mL/min   GFR calc Af Amer >60 >60 mL/min   Anion gap 18 (H) 5 - 15    Comment: Performed at [facility], [address], [place], [place] [address]  Ethanol     Status: Abnormal   Collection Time: 02/21/20  7:44 AM  Result Value Ref Range   Alcohol, Ethyl (B) 267 (H) <10 mg/dL    Comment: (NOTE) Lowest detectable limit for serum alcohol is 10 mg/dL. For medical purposes only. Performed at [facility], [address] [place], [place] [address]   Acetaminophen level     Status: Abnormal   Collection Time: 02/21/20  7:44 AM  Result Value Ref Range   Acetaminophen (Tylenol), Serum <10 (L) 10 - 30 ug/mL    Comment: (NOTE) Therapeutic concentrations vary significantly. A range of 10-30 ug/mL  may be an effective concentration for many patients. However, some  are best treated at concentrations outside of this range. Acetaminophen concentrations >150 ug/mL at 4 hours after ingestion  and >50 ug/mL at 12 hours after ingestion are often associated with  toxic reactions. Performed at [facility], [address] [place] [address], [place], [place] [id number]   Salicylate level     Status: Abnormal  Collection Time: 02/21/20  7:44 AM  Result Value Ref Range   Salicylate Lvl [id number] (L) 7.0 - 30.0 mg/dL    Comment: Performed at [facility], [address] [place], [place] [address]  Glucose, capillary     Status: Abnormal   Collection Time: 02/21/20  4:35 PM  Result Value Ref Range   Glucose-Capillary 40 (LL) 70 - 99 mg/dL    Comment: Glucose reference range applies only to samples taken after fasting for at least 8 hours.   Comment 1 Call MD NNP PA CNM     No current facility-administered medications for this encounter.   Current Outpatient Medications  Medication Sig Dispense Refill  . aspirin EC 81 MG EC tablet Take 1 tablet (81 mg total) by mouth daily. 30 tablet 0  . atorvastatin (LIPITOR) 40 MG tablet Take 1 tablet (40 mg total) by mouth daily at 6 PM. 30 tablet 0  . carvedilol (COREG) 3.125 MG tablet Take 1 tablet (3.125 mg total) by mouth 2 (two) times daily with a meal. 60 tablet 0   . pantoprazole (PROTONIX) 40 MG tablet Take 1 tablet (40 mg total) by mouth daily at 12 noon. 30 tablet 0  . ticagrelor (BRILINTA) 60 MG TABS tablet Take 1 tablet (60 mg total) by mouth 2 (two) times daily. 60 tablet 0  . valsartan (DIOVAN) 40 MG tablet Take 40 mg by mouth daily.    . cariprazine 4.5 MG CAPS Take 1 capsule (4.5 mg total) by mouth daily. (Patient not taking: Reported on 02/21/2020) 30 capsule 0  . divalproex (DEPAKOTE ER) 500 MG 24 hr tablet Take 2 tablets (1,000 mg total) by mouth at bedtime. (Patient not taking: Reported on 02/21/2020) 60 tablet 0  . irbesartan (AVAPRO) 150 MG tablet Take 1 tablet (150 mg total) by mouth daily. (Patient not taking: Reported on 02/21/2020) 30 tablet 0  . lithium carbonate (ESKALITH) 450 MG CR tablet Take 1 tablet (450 mg total) by mouth every 12 (twelve) hours. (Patient not taking: Reported on 02/21/2020) 60 tablet 0  . thiamine 100 MG tablet Take 1 tablet (100 mg total) by mouth daily. (Patient not taking: Reported on 02/21/2020) 30 tablet 0    Musculoskeletal: Strength & Muscle Tone: decreased Gait & Station: Remained in bed during evaluation Patient leans: N/A  Psychiatric Specialty Exam: Physical Exam  Nursing note and vitals reviewed. Constitutional: He is oriented to person, place, and time. He appears well-developed and well-nourished.  Cardiovascular: Normal rate.  Respiratory: Effort normal.  Musculoskeletal:        General: Normal range of motion.  Neurological: He is alert and oriented to person, place, and time.  Skin: Skin is warm.    Review of Systems  Constitutional: Negative.   HENT: Negative.   Eyes: Negative.   Respiratory: Negative.   Cardiovascular: Negative.   Gastrointestinal: Negative.   Genitourinary: Negative.   Musculoskeletal: Negative.   Skin: Negative.   Neurological: Negative.   Psychiatric/Behavioral: Negative.     Blood pressure (!) 165/99, pulse 92, temperature 97.8 F (36.6 C), temperature source  Oral, resp. rate 16, height 5\' 7"  (1.702 m), weight 68 kg, SpO2 97 %.Body mass index is 23.49 kg/m.  General Appearance: Disheveled  Eye Contact:  Good  Speech:  Clear and Coherent and Normal Rate  Volume:  Normal  Mood:  Euthymic  Affect:  Congruent  Thought Process:  Coherent and Descriptions of Associations: Intact  Orientation:  Full (Time, Place, and Person)  Thought Content:  WDL  Suicidal Thoughts:  No  Homicidal Thoughts:  No  Memory:  Immediate;   Fair Recent;   Fair Remote;   Fair  Judgement:  Fair  Insight:  Fair  Psychomotor Activity:  Normal  Concentration:  Concentration: Fair  Recall:  [employer] of Knowledge:  Fair  Language:  Fair  Akathisia:  No  Handed:  Right  AIMS (if indicated):     Assets:  Communication Skills Desire for Improvement Financial Resources/Insurance Housing Resilience Social Support  ADL's:  Intact  Cognition:  WNL  Sleep:        Treatment Plan Summary: Continue current home medications  Follow up with current outpatient psychiatrist  Disposition: No evidence of imminent risk to self or others at present.   Patient does not meet criteria for psychiatric inpatient admission. Discussed crisis plan, support from social network, calling 911, coming to the Emergency Department, and calling Suicide Hotline.  Lewis Shock, FNP 02/21/2020 5:33 PM

## 2020-02-21 NOTE — ED Notes (Signed)
Dietary bringing tray, pt resting with eyes closed, respirations equal and unlabored at this time.

## 2020-02-21 NOTE — ED Notes (Signed)
Pt. Given back belongings, pt. Changing in bathroom.

## 2020-02-21 NOTE — ED Triage Notes (Signed)
Pt here with [place] sherriff voluntary with "mental health" problems and abdominal pain. Pt states "i've been sick since yesterday". Pt complains of pain below umbilicus. Pt states is suicidal.

## 2020-07-30 ENCOUNTER — Ambulatory Visit
Admission: RE | Admit: 2020-07-30 | Discharge: 2020-07-30 | Disposition: A | Discharge status: Home / Self Care | Payer: Medicaid Other | Source: Ambulatory Visit | Attending: Radiation Oncology | Admitting: Radiation Oncology

## 2020-07-30 ENCOUNTER — Telehealth: Payer: Self-pay

## 2020-07-30 ENCOUNTER — Telehealth: Payer: Self-pay | Admitting: *Deleted

## 2020-07-30 NOTE — Telephone Encounter (Signed)
CALLED PATIENT TO RESCHEDULE FU FOR 07-30-20, LVM FOR A RETURN CALL

## 2020-07-30 NOTE — Telephone Encounter (Signed)
Called patient and left VM to see if he was aware of F/U appointment with Dr. Isidore Moos today, or if he needed to reschedule. Requested a call back to confirm. Asked Margretta Sidle to also call patient to reschedule if patient does not return call by end of day.

## 2020-08-13 ENCOUNTER — Telehealth: Payer: Self-pay | Admitting: *Deleted

## 2020-08-13 ENCOUNTER — Ambulatory Visit
Admission: RE | Admit: 2020-08-13 | Discharge: 2020-08-13 | Disposition: A | Discharge status: Home / Self Care | Payer: Medicaid Other | Source: Ambulatory Visit | Attending: Radiation Oncology | Admitting: Radiation Oncology

## 2020-08-13 NOTE — Telephone Encounter (Signed)
Patient canceled follow up appointment with Dr. Isidore Moos due to death in family - will call back to reschedule

## 2020-10-13 ENCOUNTER — Emergency Department: Payer: Medicaid Other

## 2020-10-13 ENCOUNTER — Other Ambulatory Visit: Payer: Self-pay

## 2020-10-13 ENCOUNTER — Observation Stay
Admission: EM | Admit: 2020-10-13 | Discharge: 2020-10-13 | Disposition: A | Discharge status: Home / Self Care | Payer: Medicaid Other | Attending: Internal Medicine | Admitting: Internal Medicine

## 2020-10-13 ENCOUNTER — Encounter: Payer: Self-pay | Admitting: Emergency Medicine

## 2020-10-13 DIAGNOSIS — I249 Acute ischemic heart disease, unspecified: Secondary | ICD-10-CM | POA: Insufficient documentation

## 2020-10-13 DIAGNOSIS — Z8521 Personal history of malignant neoplasm of larynx: Secondary | ICD-10-CM | POA: Insufficient documentation

## 2020-10-13 DIAGNOSIS — R202 Paresthesia of skin: Secondary | ICD-10-CM | POA: Diagnosis not present

## 2020-10-13 DIAGNOSIS — F17210 Nicotine dependence, cigarettes, uncomplicated: Secondary | ICD-10-CM | POA: Diagnosis not present

## 2020-10-13 DIAGNOSIS — Z7982 Long term (current) use of aspirin: Secondary | ICD-10-CM | POA: Insufficient documentation

## 2020-10-13 DIAGNOSIS — Z720 Tobacco use: Secondary | ICD-10-CM

## 2020-10-13 DIAGNOSIS — R079 Chest pain, unspecified: Secondary | ICD-10-CM | POA: Diagnosis present

## 2020-10-13 DIAGNOSIS — Z20822 Contact with and (suspected) exposure to covid-19: Secondary | ICD-10-CM | POA: Diagnosis not present

## 2020-10-13 DIAGNOSIS — C320 Malignant neoplasm of glottis: Secondary | ICD-10-CM | POA: Diagnosis present

## 2020-10-13 DIAGNOSIS — I119 Hypertensive heart disease without heart failure: Secondary | ICD-10-CM | POA: Insufficient documentation

## 2020-10-13 DIAGNOSIS — F1010 Alcohol abuse, uncomplicated: Secondary | ICD-10-CM | POA: Diagnosis not present

## 2020-10-13 DIAGNOSIS — F17200 Nicotine dependence, unspecified, uncomplicated: Secondary | ICD-10-CM | POA: Diagnosis present

## 2020-10-13 DIAGNOSIS — R0789 Other chest pain: Secondary | ICD-10-CM | POA: Diagnosis present

## 2020-10-13 DIAGNOSIS — I10 Essential (primary) hypertension: Secondary | ICD-10-CM | POA: Diagnosis present

## 2020-10-13 DIAGNOSIS — I255 Ischemic cardiomyopathy: Secondary | ICD-10-CM | POA: Diagnosis not present

## 2020-10-13 DIAGNOSIS — I2510 Atherosclerotic heart disease of native coronary artery without angina pectoris: Secondary | ICD-10-CM | POA: Diagnosis not present

## 2020-10-13 DIAGNOSIS — Z79899 Other long term (current) drug therapy: Secondary | ICD-10-CM | POA: Diagnosis not present

## 2020-10-13 DIAGNOSIS — I25119 Atherosclerotic heart disease of native coronary artery with unspecified angina pectoris: Secondary | ICD-10-CM | POA: Diagnosis not present

## 2020-10-13 LAB — LITHIUM LEVEL: Lithium Lvl: 0.12 mmol/L — ABNORMAL LOW (ref 0.60–1.20)

## 2020-10-13 LAB — URINE DRUG SCREEN, QUALITATIVE (ARMC ONLY)
Amphetamines, Ur Screen: NOT DETECTED
Barbiturates, Ur Screen: NOT DETECTED
Benzodiazepine, Ur Scrn: NOT DETECTED
Cannabinoid 50 Ng, Ur ~~LOC~~: POSITIVE — AB
Cocaine Metabolite,Ur ~~LOC~~: NOT DETECTED
MDMA (Ecstasy)Ur Screen: NOT DETECTED
Methadone Scn, Ur: NOT DETECTED
Opiate, Ur Screen: POSITIVE — AB
Phencyclidine (PCP) Ur S: NOT DETECTED
Tricyclic, Ur Screen: NOT DETECTED

## 2020-10-13 LAB — CBC
HCT: 47 % (ref 39.0–52.0)
Hemoglobin: 16.2 g/dL (ref 13.0–17.0)
MCH: 33.5 pg (ref 26.0–34.0)
MCHC: 34.5 g/dL (ref 30.0–36.0)
MCV: 97.1 fL (ref 80.0–100.0)
Platelets: 148 10*3/uL — ABNORMAL LOW (ref 150–400)
RBC: 4.84 MIL/uL (ref 4.22–5.81)
RDW: 12.4 % (ref 11.5–15.5)
WBC: 5.9 10*3/uL (ref 4.0–10.5)
nRBC: 0 % (ref 0.0–0.2)

## 2020-10-13 LAB — CBG MONITORING, ED
Glucose-Capillary: 104 mg/dL — ABNORMAL HIGH (ref 70–99)
Glucose-Capillary: 91 mg/dL (ref 70–99)

## 2020-10-13 LAB — TROPONIN I (HIGH SENSITIVITY)
Troponin I (High Sensitivity): 12 ng/L (ref <18)
Troponin I (High Sensitivity): 13 ng/L (ref <18)
Troponin I (High Sensitivity): 10 ng/L (ref <18)

## 2020-10-13 LAB — BASIC METABOLIC PANEL
Anion gap: 9 (ref 5–15)
BUN: 6 mg/dL (ref 6–20)
CO2: 28 mmol/L (ref 22–32)
Calcium: 9.8 mg/dL (ref 8.9–10.3)
Chloride: 103 mmol/L (ref 98–111)
Creatinine, Ser: 0.73 mg/dL (ref 0.61–1.24)
GFR, Estimated: >60 mL/min (ref >60)
Glucose, Bld: 112 mg/dL — ABNORMAL HIGH (ref 70–99)
Potassium: 4.3 mmol/L (ref 3.5–5.1)
Sodium: 140 mmol/L (ref 135–145)

## 2020-10-13 LAB — RESPIRATORY PANEL BY RT PCR (FLU A&B, COVID)
Influenza A by PCR: NEGATIVE
Influenza B by PCR: NEGATIVE
SARS Coronavirus 2 by RT PCR: NEGATIVE

## 2020-10-13 LAB — MAGNESIUM: Magnesium: 1.9 mg/dL (ref 1.7–2.4)

## 2020-10-13 LAB — HIV ANTIBODY (ROUTINE TESTING W REFLEX): HIV Screen 4th Generation wRfx: NONREACTIVE

## 2020-10-13 LAB — GAMMA GT: GGT: 49 U/L (ref 7–50)

## 2020-10-13 LAB — ETHANOL: Alcohol, Ethyl (B): 294 mg/dL — ABNORMAL HIGH (ref <10)

## 2020-10-13 LAB — PHOSPHORUS: Phosphorus: 4.8 mg/dL — ABNORMAL HIGH (ref 2.5–4.6)

## 2020-10-13 MED ORDER — IOHEXOL 350 MG/ML SOLN
75.0000 mL | Freq: Once | INTRAVENOUS | Status: AC | PRN
  Administered 2020-10-13: 75 mL via INTRAVENOUS

## 2020-10-13 MED ORDER — IOHEXOL 300 MG/ML  SOLN: 75.0000 mL | Freq: Once | INTRAMUSCULAR | Status: DC | PRN

## 2020-10-13 MED ORDER — ASPIRIN 81 MG PO CHEW
324.0000 mg | CHEWABLE_TABLET | Freq: Once | ORAL | Status: AC
  Administered 2020-10-13: 324 mg via ORAL
  Filled 2020-10-13: qty 4

## 2020-10-13 MED ORDER — PANTOPRAZOLE SODIUM 40 MG PO TBEC
40.0000 mg | DELAYED_RELEASE_TABLET | Freq: Every day | ORAL | Status: DC
  Administered 2020-10-13: 40 mg via ORAL
  Filled 2020-10-13: qty 1

## 2020-10-13 MED ORDER — NITROGLYCERIN 0.4 MG SL SUBL
0.4000 mg | SUBLINGUAL_TABLET | SUBLINGUAL | Status: DC | PRN
  Administered 2020-10-13: 0.4 mg via SUBLINGUAL
  Filled 2020-10-13: qty 1

## 2020-10-13 MED ORDER — ENOXAPARIN SODIUM 40 MG/0.4ML ~~LOC~~ SOLN: 40.0000 mg | SUBCUTANEOUS | Status: DC

## 2020-10-13 MED ORDER — THIAMINE HCL 100 MG/ML IJ SOLN: 100.0000 mg | Freq: Every day | INTRAMUSCULAR | Status: DC

## 2020-10-13 MED ORDER — THIAMINE HCL 100 MG PO TABS
100.0000 mg | ORAL_TABLET | Freq: Every day | ORAL | Status: DC
  Administered 2020-10-13: 100 mg via ORAL
  Filled 2020-10-13: qty 1

## 2020-10-13 MED ORDER — ADULT MULTIVITAMIN W/MINERALS CH
1.0000 | ORAL_TABLET | Freq: Every day | ORAL | Status: DC
  Administered 2020-10-13: 1 via ORAL
  Filled 2020-10-13: qty 1

## 2020-10-13 MED ORDER — ACETAMINOPHEN 325 MG PO TABS: 650.0000 mg | ORAL_TABLET | ORAL | Status: DC | PRN

## 2020-10-13 MED ORDER — MORPHINE SULFATE (PF) 4 MG/ML IV SOLN
4.0000 mg | Freq: Once | INTRAVENOUS | Status: AC
  Administered 2020-10-13: 4 mg via INTRAVENOUS
  Filled 2020-10-13: qty 1

## 2020-10-13 MED ORDER — ALPRAZOLAM 0.5 MG PO TABS: 1.0000 mg | ORAL_TABLET | Freq: Three times a day (TID) | ORAL | Status: DC | PRN

## 2020-10-13 MED ORDER — ASPIRIN EC 81 MG PO TBEC
81.0000 mg | DELAYED_RELEASE_TABLET | Freq: Every day | ORAL | Status: DC
  Administered 2020-10-13: 81 mg via ORAL
  Filled 2020-10-13: qty 1

## 2020-10-13 MED ORDER — LORAZEPAM 1 MG PO TABS: 1.0000 mg | ORAL_TABLET | ORAL | Status: DC | PRN

## 2020-10-13 MED ORDER — ONDANSETRON HCL 4 MG/2ML IJ SOLN: 4.0000 mg | Freq: Four times a day (QID) | INTRAMUSCULAR | Status: DC | PRN

## 2020-10-13 MED ORDER — ONDANSETRON HCL 4 MG/2ML IJ SOLN
4.0000 mg | Freq: Once | INTRAMUSCULAR | Status: AC
  Administered 2020-10-13: 4 mg via INTRAVENOUS
  Filled 2020-10-13: qty 2

## 2020-10-13 MED ORDER — LITHIUM CARBONATE ER 450 MG PO TBCR
450.0000 mg | EXTENDED_RELEASE_TABLET | Freq: Two times a day (BID) | ORAL | Status: DC
  Administered 2020-10-13: 450 mg via ORAL
  Filled 2020-10-13 (×2): qty 1

## 2020-10-13 MED ORDER — DIVALPROEX SODIUM ER 250 MG PO TB24
500.0000 mg | ORAL_TABLET | Freq: Two times a day (BID) | ORAL | Status: DC
  Administered 2020-10-13: 500 mg via ORAL
  Filled 2020-10-13: qty 2

## 2020-10-13 MED ORDER — LORAZEPAM 2 MG/ML IJ SOLN: 1.0000 mg | INTRAMUSCULAR | Status: DC | PRN

## 2020-10-13 MED ORDER — IRBESARTAN 75 MG PO TABS
37.5000 mg | ORAL_TABLET | Freq: Every day | ORAL | Status: DC
  Administered 2020-10-13: 37.5 mg via ORAL
  Filled 2020-10-13: qty 0.5

## 2020-10-13 MED ORDER — LORAZEPAM 2 MG/ML IJ SOLN
2.0000 mg | Freq: Once | INTRAMUSCULAR | Status: AC
  Administered 2020-10-13: 2 mg via INTRAVENOUS
  Filled 2020-10-13: qty 1

## 2020-10-13 MED ORDER — ATORVASTATIN CALCIUM 20 MG PO TABS: 40.0000 mg | ORAL_TABLET | Freq: Every day | ORAL | Status: DC

## 2020-10-13 MED ORDER — FOLIC ACID 1 MG PO TABS
1.0000 mg | ORAL_TABLET | Freq: Every day | ORAL | Status: DC
  Administered 2020-10-13: 1 mg via ORAL
  Filled 2020-10-13: qty 1

## 2020-10-13 MED ORDER — CARVEDILOL 6.25 MG PO TABS: 3.1250 mg | ORAL_TABLET | Freq: Two times a day (BID) | ORAL | Status: DC

## 2020-10-13 NOTE — TOC Transition Note (Signed)
Transition of Care [facility]) - CM/SW Discharge Note   Patient Details  Name: BENIAH MAGNAN MRN: [id number] Date of Birth: [birth year]  Transition of [facility]) CM/SW Contact:  Ova Freshwater Phone Number:  [phone] 10/13/2020, 4:00 PM   Clinical Narrative:     Patient left AMA.         Patient Goals and CMS Choice        Discharge Placement                       Discharge Plan and Services                                     Social Determinants of Health (SDOH) Interventions     Readmission Risk Interventions No flowsheet data found.

## 2020-10-13 NOTE — ED Notes (Signed)
Pt left AMA. IV taken out. Amy, RN aware.

## 2020-10-13 NOTE — ED Notes (Signed)
Pt is very upset and trying to go out to smoke. This tech informed the patient that he cannot smoke inside the hospital. He responded, "I am not. I am going outside to smoke." This tech told the patient that I can inform his nurse for a nicotine patch. Pt responded, "No, I am going outside." CN called to help assist this tech to deescalate the situation. Amy, RN aware of the situation.

## 2020-10-13 NOTE — ED Notes (Signed)
Pt refusing to stay in bed despite being informed that he is a fall risk due to the amount of medications that he has received. Pt not oriented but cooperative. Sitter at bedside.

## 2020-10-13 NOTE — ED Notes (Addendum)
Pt walked out and said someone is coming to pick him up.  Dr agbata aware.  Iv was removed by gabby edt.  ama form was not signed by pt.

## 2020-10-13 NOTE — Consult Note (Signed)
Cardiology Consultation:   Patient ID: MARVON SHILLINGBURG; [id number]; 27-Sep-1961   Admit date: 10/13/2020 Date of Consult: 10/13/2020  Primary Care Provider: Gayland Curry, MD Primary Cardiologist: Fletcher Anon Primary Electrophysiologist:  None   Patient Profile:   [name] is a 59 y.o. male with a hx of CAD status post PCI as below, HFrEF secondary to ICM, PAD status post prior bilateral iliac stenting and more recent aortobifemoral bypass in 11/2014 by Dr. Kellie Simmering followed by [facility], mild to moderate left-sided renal artery stenosis, hepatitis C, ongoing tobacco and alcohol abuse, bipolar disorder, laryngeal cancer, hypertension, and hyperlipidemia  who is being seen today for the evaluation of chest pain at the request of Dr. Francine Graven.  History of Present Illness:   Mr. Braniff underwent cath in 2008 with successful PCI to the RCA.  Repeat cath in 05/2012 showed an occluded LCx that was previously stented with details unclear, patent RCA stent with mild in-stent restenosis, nonobstructive disease involving the LAD with an EF of 35%.  Repeat cath in 06/2013 showed stable coronary anatomy.  He was admitted to the hospital in 03/2018 with an inferior ST elevation MI and was found to have an occluded RCA stent which was successfully treated with PCI/DES.  His previously documented occluded LCx was again noted.  EF was 40 to 45%.  Over the years he has continued to note intermittent randomly occurring chest pain though has declined follow-up ischemic evaluation.  He continues to smoke and drink alcohol.  He is uninterested in quitting.  He has had multiple ER visits for chest pain in the setting of alcohol intoxication.   He presented to the ED on 11/10 with a 35-year history of intermittent left-sided chest pain that is improved when elevating his left arm above his head.  There are no aggravating factors.  Pain randomly resolves without intervention.  He denies any shortness of breath,  palpitations, dizziness, presyncope, syncope, lower extremity swelling, abdominal distention, orthopnea, PND, early satiety.  Indicates this pain is not different from pain and noted at prior visits.  He reports he has discontinued all of his cardiac medications including DAPT.  Indicates he continues to drink though refuses to discuss this any further.  He continues to smoke 1 pack of cigarettes per day and is uninterested in quitting.  Work-up in the ER notable for negative high-sensitivity troponin x2, urine drug screen positive for opiate and cannabinoid.  Ethanol and GGT are pending.  EKG showed NSR, 96 bpm, left anterior fascicular block, prior anterior and inferior infarct, without significant changes when compared to prior tracing.  Chest x-ray showed no acute cardiopulmonary process.  CT head for complaints of numbness/tingling showed no acute findings with noted atrophy and chronic small vessel ischemic changes along with an old right frontoparietal craniotomy and one small vessel infarction of the right basal ganglia.  CTA of the aorta showed no PE, aortic dissection, or other acute chest pathology.  He has received ASA 324 mg and 81 mg in the ED today along with morphine and sublingual nitroglycerin.  Currently, resting comfortably in bed though continues to note chest discomfort that is improved when raising his left arm above his head.  Past Medical History:  Diagnosis Date  . Anxiety   . Bipolar affective ([place])    takes Lithium daily  . Broken fingers   . Coronary artery disease    a. 1997 PCI: RCA ([facility]); b. 2008 PCI: RCA; c. 05/2012 Cath: LCx 100 (previously stented - ?  date), RCA stent w/ mild ISR, LAD nonobs, EF 35%; d. 06/2013 Cath: stable anatomy; e. 03/2018 Inf STEMI/PCI: LCX 100o/p ISR (CTO), RCA 137m/d (3.0x38 Resolute Onyx DES), EF 40-45%.   . Depression   . GERD (gastroesophageal reflux disease)    takes Protonix daily  . Hepatitis C    "diagnosed in the past 2 wk"  (10/22/2012)  . History of ETOH abuse    quit 2007  . History of radiation therapy 12/17/17- 01/24/18   Larynx treated to 63 gy in 28 fx of 2.25 Gy  . Hyperlipidemia   . Hypertension    "pt. denies - no longer taking blood pressure medications"  . Ischemic cardiomyopathy    a. 03/2018 LV gram: EF 40-45% @ time of MI.  Marland Kitchen Myocardial infarction ([place])    x 4  . PAD (peripheral artery disease) ([place])    a. 2010 PTA & stent to left CIA & left SFA; b. 05/2012 PTA & stent to distal LCIA; c. 10/2012 thrombectomy and stents to [place]; d. 11/2014 s/p Ao-Bifem bypass.  . Schizophrenia ([place])   . Seizures ([place])    "when I get anxious" (Stated last seizure three months ago)  . Tobacco abuse   . Urinary frequency     Past Surgical History:  Procedure Laterality Date  . ABDOMINAL AORTAGRAM N/A 10/22/2012   Procedure: ABDOMINAL Maxcine Ham;  Surgeon: Wellington Hampshire, MD;  Location: [place] [facility];  Service: Cardiovascular;  Laterality: N/A;  . ABDOMINAL AORTAGRAM N/A 11/11/2014   Procedure: ABDOMINAL Maxcine Ham;  Surgeon: Wellington Hampshire, MD;  Location: [place] [facility];  Service: Cardiovascular;  Laterality: N/A;  . ANTERIOR CERVICAL DECOMP/DISCECTOMY FUSION  01/2011   plate & screw placement   . ANTERIOR CERVICAL DECOMP/DISCECTOMY FUSION N/A 01/26/2017   Procedure: ANTERIOR CERVICAL DECOMPRESSION/DISCECTOMY FUSION CERVICAL FOUR- CERVICAL FIVE;  Surgeon: Ashok Pall, MD;  Location: [place];  Service: Neurosurgery;  Laterality: N/A;  ANTERIOR CERVICAL DECOMPRESSION/DISCECTOMY FUSION CERVICAL FOUR- CERVICAL FIVE  . AORTA - BILATERAL FEMORAL ARTERY BYPASS GRAFT N/A 11/20/2014   Procedure: AORTA BIFEMORAL BYPASS GRAFT;  Surgeon: Mal Misty, MD;  Location: [facility];  Service: Vascular;  Laterality: N/A;  . BACK SURGERY    . BRAIN SURGERY  1989   subdural hematoma  . CARDIAC CATHETERIZATION     Stent - 12  . CHOLECYSTECTOMY N/A 06/29/2017   Procedure: LAPAROSCOPIC CHOLECYSTECTOMY WITH INTRAOPERATIVE  CHOLANGIOGRAM, POSSIBLE OPEN;  Surgeon: Robert Bellow, MD;  Location: [facility];  Service: General;  Laterality: N/A;  . CORONARY ANGIOPLASTY WITH STENT PLACEMENT  03/2007   to RCA  . CORONARY/GRAFT ACUTE MI REVASCULARIZATION N/A 03/07/2018   Procedure: Coronary/Graft Acute MI Revascularization;  Surgeon: Isaias Cowman, MD;  Location: [place] [facility];  Service: Cardiovascular;  Laterality: N/A;  . HERNIA REPAIR    . ILIAC ARTERY STENT  10/22/2012   "2" (10/22/2012)  . ILIAC ARTERY STENT  06/2012   left  . INCISIONAL HERNIA REPAIR N/A 11/08/2015   Procedure: INCISIONAL HERNIA REPAIR WITH MYOFASCIAL RELEASE;  Surgeon: Rolm Bookbinder, MD;  Location: [place];  Service: General;  Laterality: N/A;  . INSERTION OF MESH N/A 11/08/2015   Procedure: INSERTION OF MESH;  Surgeon: Rolm Bookbinder, MD;  Location: [place] [facility];  Service: General;  Laterality: N/A;  . LEFT HEART CATH AND CORONARY ANGIOGRAPHY N/A 03/07/2018   Procedure: LEFT HEART CATH AND CORONARY ANGIOGRAPHY;  Surgeon: Isaias Cowman, MD;  Location: [place];  Service: Cardiovascular;  Laterality: N/A;  .  LEFT HEART CATHETERIZATION WITH CORONARY ANGIOGRAM N/A 05/29/2012   Procedure: LEFT HEART CATHETERIZATION WITH CORONARY ANGIOGRAM;  Surgeon: Wellington Hampshire, MD;  Location: [place] [facility];  Service: Cardiovascular;  Laterality: N/A;  . LIVER BIOPSY  10/2012  . LOWER EXTREMITY ANGIOGRAM N/A 05/29/2012   Procedure: LOWER EXTREMITY ANGIOGRAM;  Surgeon: Wellington Hampshire, MD;  Location: [facility];  Service: Cardiovascular;  Laterality: N/A;  . MICROLARYNGOSCOPY WITH CO2 LASER AND EXCISION OF VOCAL CORD LESION N/A 11/21/2017   Procedure: MICROLARYNGOSCOPY WITH EXCISION OF VOCAL CORD LESION;  Surgeon: Jerrell Belfast, MD;  Location: [place];  Service: ENT;  Laterality: N/A;  . PERCUTANEOUS STENT INTERVENTION Left 05/29/2012   Procedure: PERCUTANEOUS STENT INTERVENTION;  Surgeon: Wellington Hampshire, MD;  Location: [place] [facility];   Service: Cardiovascular;  Laterality: Left;  . POSTERIOR FUSION CERVICAL SPINE  2012  . SUBDURAL HEMATOMA EVACUATION VIA CRANIOTOMY       Home Meds: Prior to Admission medications   Medication Sig Start Date End Date Taking? Authorizing Provider  ALPRAZolam Duanne Moron) 1 MG tablet Take 1-2 mg by mouth 3 (three) times daily as needed for anxiety.  10/04/18  Yes [provider]  aspirin EC 81 MG EC tablet Take 1 tablet (81 mg total) by mouth daily. 08/29/19  Yes Clapacs, Madie Reno, MD  atorvastatin (LIPITOR) 40 MG tablet Take 1 tablet (40 mg total) by mouth daily at 6 PM. 08/28/19  Yes Clapacs, Madie Reno, MD  cariprazine 4.5 MG CAPS Take 1 capsule (4.5 mg total) by mouth daily. 08/28/19  Yes Clapacs, Madie Reno, MD  carvedilol (COREG) 3.125 MG tablet Take 1 tablet (3.125 mg total) by mouth 2 (two) times daily with a meal. 08/28/19  Yes Clapacs, Madie Reno, MD  divalproex (DEPAKOTE ER) 500 MG 24 hr tablet Take 2 tablets (1,000 mg total) by mouth at bedtime. Patient taking differently: Take 500 mg by mouth 2 (two) times daily.  08/28/19  Yes Clapacs, Madie Reno, MD  lithium carbonate (ESKALITH) 450 MG CR tablet Take 1 tablet (450 mg total) by mouth every 12 (twelve) hours. 08/28/19  Yes Clapacs, Madie Reno, MD  pantoprazole (PROTONIX) 40 MG tablet Take 1 tablet (40 mg total) by mouth daily at 12 noon. 08/28/19  Yes Clapacs, Madie Reno, MD  valsartan (DIOVAN) 40 MG tablet Take 40 mg by mouth daily. 01/08/20  Yes [provider]  irbesartan (AVAPRO) 150 MG tablet Take 1 tablet (150 mg total) by mouth daily. Patient not taking: Reported on 02/21/2020 08/29/19   Clapacs, Madie Reno, MD  thiamine 100 MG tablet Take 1 tablet (100 mg total) by mouth daily. Patient not taking: Reported on 02/21/2020 08/29/19   Clapacs, Madie Reno, MD  ticagrelor (BRILINTA) 60 MG TABS tablet Take 1 tablet (60 mg total) by mouth 2 (two) times daily. Patient not taking: Reported on 10/13/2020 08/28/19   Clapacs, Madie Reno, MD    Inpatient  Medications: Scheduled Meds: . aspirin EC  81 mg Oral Daily  . atorvastatin  40 mg Oral q1800  . carvedilol  3.125 mg Oral BID WC  . divalproex  500 mg Oral BID  . enoxaparin (LOVENOX) injection  40 mg Subcutaneous Q24H  . folic acid  1 mg Oral Daily  . irbesartan  37.5 mg Oral Daily  . lithium carbonate  450 mg Oral Q12H  . multivitamin with minerals  1 tablet Oral Daily  . pantoprazole  40 mg Oral Q1200  . thiamine  100 mg Oral Daily  Or  . thiamine  100 mg Intravenous Daily   Continuous Infusions:  PRN Meds: acetaminophen, ALPRAZolam, LORazepam **OR** LORazepam, nitroGLYCERIN, ondansetron (ZOFRAN) IV  Allergies:   Allergies  Allergen Reactions  . Lisinopril Other (See Comments)    Other reaction(s): Elevated creatine and very elevated potassium     Social History:   Social History   Socioeconomic History  . Marital status: Divorced    Spouse name: Not on file  . Number of children: Not on file  . Years of education: Not on file  . Highest education level: Not on file  Occupational History  . Occupation: Disabled    Employer: UNEMPLOYED  Tobacco Use  . Smoking status: Current Every Day Smoker    Packs/day: 2.00    Years: 40.00    Pack years: 80.00    Types: Cigarettes  . Smokeless tobacco: Never Used  . Tobacco comment: "ive slowed down recently"   Vaping Use  . Vaping Use: Former  Substance and Sexual Activity  . Alcohol use: Yes    Alcohol/week: 0.0 standard drinks    Comment: 1/2 gallon or 20 beers daily  . Drug use: Yes    Types: Marijuana    Comment: last smoked 1 month ago  . Sexual activity: Yes  Other Topics Concern  . Not on file  Social History Narrative   17 years Bipolar/ADHD.   Pt gets regular exercise.   Social Determinants of Health   Financial Resource Strain:   . Difficulty of Paying Living Expenses: Not on file  Food Insecurity:   . Worried About Charity fundraiser in the Last Year: Not on file  . Ran Out of Food in the  Last Year: Not on file  Transportation Needs:   . Lack of Transportation (Medical): Not on file  . Lack of Transportation (Non-Medical): Not on file  Physical Activity:   . Days of Exercise per Week: Not on file  . Minutes of Exercise per Session: Not on file  Stress:   . Feeling of Stress : Not on file  Social Connections:   . Frequency of Communication with Friends and Family: Not on file  . Frequency of Social Gatherings with Friends and Family: Not on file  . Attends Religious Services: Not on file  . Active Member of Clubs or Organizations: Not on file  . Attends Archivist Meetings: Not on file  . Marital Status: Not on file  Intimate Partner Violence:   . Fear of Current or Ex-Partner: Not on file  . Emotionally Abused: Not on file  . Physically Abused: Not on file  . Sexually Abused: Not on file     Family History:   Family History  Problem Relation Age of Onset  . Emphysema Father   . COPD Mother   . Diabetes Other   . Alcohol abuse Other   . Hypertension Other   . Hyperlipidemia Other   . Seizures Other     ROS:  Review of Systems  Constitutional: Negative for chills, diaphoresis, fever, malaise/fatigue and weight loss.  HENT: Negative for congestion.   Eyes: Negative for discharge and redness.  Respiratory: Negative for cough, sputum production, shortness of breath and wheezing.   Cardiovascular: Positive for chest pain. Negative for palpitations, orthopnea, claudication, leg swelling and PND.  Gastrointestinal: Negative for abdominal pain, heartburn, nausea and vomiting.  Musculoskeletal: Negative for falls and myalgias.  Skin: Negative for rash.  Neurological: Negative for dizziness, tingling, tremors, sensory change,  speech change, focal weakness, loss of consciousness and weakness.  Endo/Heme/Allergies: Does not bruise/bleed easily.  Psychiatric/Behavioral: Positive for substance abuse. The patient is nervous/anxious.   All other systems  reviewed and are negative.     Physical Exam/Data:   Vitals:   10/13/20 1434 10/13/20 1436 10/13/20 1438 10/13/20 1445  BP: (!) 88/73  96/67 102/72  Pulse: 95 89 75 80  Resp: 20 12 16 11   Temp:      TempSrc:      SpO2: 97% 97% 95% 95%  Weight:      Height:       No intake or output data in the 24 hours ending 10/13/20 1518 Filed Weights   10/13/20 0912  Weight: 54.4 kg   Body mass index is 18.79 kg/m.   Physical Exam: General: Well developed, well nourished, in no acute distress. Head: Normocephalic, atraumatic, sclera non-icteric, no xanthomas, nares without discharge.  Neck: Negative for carotid bruits. JVD not elevated. Lungs: Clear bilaterally to auscultation without wheezes, rales, or rhonchi. Breathing is unlabored. Heart: RRR with S1 S2. No murmurs, rubs, or gallops appreciated.  Elevation of left arm overhead improved chest pain. Abdomen: Soft, non-tender, non-distended with normoactive bowel sounds. No hepatomegaly. No rebound/guarding. No obvious abdominal masses. Msk:  Strength and tone appear normal for age. Extremities: No clubbing or cyanosis. No edema. Distal pedal pulses are 2+ and equal bilaterally. Neuro: Alert and oriented X 3. No facial asymmetry. No focal deficit. Moves all extremities spontaneously. Psych:  Responds to questions appropriately with a normal affect.   EKG:  The EKG was personally reviewed and demonstrates: NSR, 96 bpm, left anterior fascicular block, prior anterior and inferior infarct, without significant changes when compared to prior tracing Telemetry:  Telemetry was personally reviewed and demonstrates: SR  Weights: [employer]   10/13/20 0912  Weight: 54.4 kg    Relevant CV Studies: 2D echo pending  Laboratory Data:  Chemistry Recent Labs  Lab 10/13/20 0917  NA 140  K 4.3  CL 103  CO2 28  GLUCOSE 112*  BUN 6  CREATININE 0.73  CALCIUM 9.8  GFRNONAA >60  ANIONGAP 9    No results for input(s): PROT, ALBUMIN,  AST, ALT, ALKPHOS, BILITOT in the last 168 hours. Hematology Recent Labs  Lab 10/13/20 0917  WBC 5.9  RBC 4.84  HGB 16.2  HCT 47.0  MCV 97.1  MCH 33.5  MCHC 34.5  RDW 12.4  PLT 148*   Cardiac EnzymesNo results for input(s): TROPONINI in the last 168 hours. No results for input(s): TROPIPOC in the last 168 hours.  BNPNo results for input(s): BNP, PROBNP in the last 168 hours.  DDimer No results for input(s): DDIMER in the last 168 hours.  Radiology/Studies:  DG Chest 2 View  Result Date: 10/13/2020 CLINICAL DATA:  Chest pain EXAM: CHEST - 2 VIEW COMPARISON:  08/25/2019 chest radiograph and prior. FINDINGS: No focal consolidation, pneumothorax or pleural effusion. Cardiomediastinal silhouette within normal limits of size. Sequela of cardiac stent placement. Remote bilateral rib posttraumatic deformities. Sequela of ACDF. IMPRESSION: No focal airspace disease. Electronically Signed   By: Primitivo Gauze M.D.   On: 10/13/2020 10:03   CT Head Wo Contrast  Result Date: 10/13/2020 CLINICAL DATA:  Paresthesia.  Numbness and tingling. EXAM: CT HEAD WITHOUT CONTRAST TECHNIQUE: Contiguous axial images were obtained from the base of the skull through the vertex without intravenous contrast. COMPARISON:  08/25/2019 FINDINGS: Brain: Moderate volume loss. No evidence of acute focal infarction. Old small  vessel infarction in the right basal ganglia. Mild chronic small-vessel changes of the cerebral hemispheric white matter. No mass, hemorrhage, hydrocephalus or extra-axial collection. Old right frontoparietal craniotomy. Vascular: There is atherosclerotic calcification of the major vessels at the base of the brain. Skull: Old right frontoparietal craniotomy. Sinuses/Orbits: Clear/normal Other: None IMPRESSION: No acute finding by CT. Atrophy and chronic small-vessel ischemic changes. Old right frontoparietal craniotomy. Old small vessel infarction in the right basal ganglia. Electronically Signed    By: Nelson Chimes M.D.   On: 10/13/2020 11:02   CT ANGIO CHEST AORTA W/CM & OR WO/CM  Result Date: 10/13/2020 IMPRESSION: 1. No pulmonary emboli, aortic dissection or other acute chest pathology. 2. Coronary artery calcification and stents. Aortic atherosclerosis. 3. Multiple old thoracic compression deformities. Inferior endplate fracture at T9 which was not seen previously and could be recent. 4. No acute or significant pulmonary finding. Aortic Atherosclerosis (ICD10-I70.0). Electronically Signed   By: Nelson Chimes M.D.   On: 10/13/2020 11:00    Assessment and Plan:   1.  CAD involving the native coronaries with chronic chest pain: -Patient reports a 35-year history of intermittent left-sided chest pain that improves with raising his left arm that is not exacerbated by exertion and is randomly occurring with noted spontaneous resolution -Patient does have noted coronary artery disease with ongoing significant risk factors though his symptoms are atypical and EKG is nonischemic along with high-sensitivity troponin negative x2 -Discontinue troponin cycling given negative high-sensitivity troponin x2 -He has been noncompliant with recommended continued indefinite dual antiplatelet therapy -Resume ASA at the minimum, will discuss with MD regarding resumption of low-dose ticagrelor as previously recommended by primary cardiologist -Echo pending per internal medicine with further recommendations based on these results  2.  HFrEF secondary to ICM: -He appears euvolemic and well compensated -Noncompliant with beta-blocker and ARB -Resume beta-blocker and ARB with further recommendations pending echo results -Echo pending  3. Alcohol use: -Patient refuses to discuss alcohol use and/or cessation -Obtain ethanol and GGT  4.  Tobacco use: -Uninterested in quitting -Complete cessation recommended  5.  HLD: -Check lipid panel with goal LDL being less than 70 -Recommend adding on  LFT -Atorvastatin   For questions or updates, please contact [facility] Please consult [email or web address] for contact info under Cardiology/STEMI.   Signed, Christell Faith, PA-C [place] Pager: [phone] 10/13/2020, 3:18 PM

## 2020-10-13 NOTE — ED Notes (Signed)
Rainbow was sent to lab. 

## 2020-10-13 NOTE — H&P (Addendum)
History and Physical    [name] VQX:[id number] DOB: 1961/02/10 DOA: 10/13/2020  PCP: Gayland Curry, MD   Patient coming from: Home  I have personally briefly reviewed patient's old medical records in [place]  Chief Complaint: Chest pain  HPI: [name] is a 59 y.o. male with medical history significant for coronary artery disease status post stent angioplasty, history of alcohol abuse, history of GERD, history of throat cancer status post radiation therapy, ischemic cardiomyopathy last known LVEF 40 to 45%, nicotine dependence, bipolar disease and hepatitis C who presents to the ER for evaluation of a 2-day history of left-sided chest pain.  He rated his pain 10 x 10 in intensity at its worst, located over the upper anterior chest wall with radiation to the left arm and associated with nausea and shortness of breath.  He also complains of some numbness in his left.  Despite having a known history of coronary artery disease, he is not taking any medications except for lithium.  He had taken 2 nitroglycerin tablets at home without any relief of his pain and decided to present to the ER due to the intensity of the pain. He denies having any abdominal pain, no emesis, no changes in his bowel habits, no fever, no chills, no cough, no palpitations or diaphoresis. Labs show sodium 140, potassium 4.3, chloride 103, bicarb 28, glucose 112, BUN 6, creatinine 0.77, calcium 9.8, troponin 12 >> 13, white count 5.9, hemoglobin 16.2, hematocrit 47, MCV 97, RDW 12.4, platelet count 148, lithium level 0.12 Respiratory viral panel is negative CT scan of the head without contrast shows no acute findings.  Atrophic and chronic small vessel ischemic changes.  Old right frontoparietal craniotomy.  Also vessel infarction in the right basal ganglia.  CT angiogram of the chest shows no pulmonary emboli, no aortic dissection or other acute chest pathology.  Coronary artery calcification and  stents.  Multiple old thoracic compression deformities. Chest x-ray reviewed by me shows no acute findings. Twelve-lead EKG reviewed by me shows sinus rhythm    ED Course: Patient is a 59 year old Caucasian male with a history of coronary artery disease status post stent angioplasty, history of alcohol abuse and nicotine dependence who presents for evaluation of left-sided chest pain with radiation to his left arm associated with nausea and shortness of breath.  His pain was not relieved by nitroglycerin and is persistent.  Due to his cardiac history, ER has requested an observation admission for this patient for further evaluation.   Review of Systems: As per HPI otherwise 10 point review of systems negative.    Past Medical History:  Diagnosis Date  . Anxiety   . Bipolar affective ([place])    takes Lithium daily  . Broken fingers   . Coronary artery disease    a. 1997 PCI: RCA ([facility]); b. 2008 PCI: RCA; c. 05/2012 Cath: LCx 100 (previously stented - ? date), RCA stent w/ mild ISR, LAD nonobs, EF 35%; d. 06/2013 Cath: stable anatomy; e. 03/2018 Inf STEMI/PCI: LCX 100o/p ISR (CTO), RCA 144m/d (3.0x38 Resolute Onyx DES), EF 40-45%.   . Depression   . GERD (gastroesophageal reflux disease)    takes Protonix daily  . Hepatitis C    "diagnosed in the past 2 wk" (10/22/2012)  . History of ETOH abuse    quit 2007  . History of radiation therapy 12/17/17- 01/24/18   Larynx treated to 63 gy in 28 fx of 2.25 Gy  . Hyperlipidemia   .  Hypertension    "pt. denies - no longer taking blood pressure medications"  . Ischemic cardiomyopathy    a. 03/2018 LV gram: EF 40-45% @ time of MI.  Marland Kitchen Myocardial infarction ([place])    x 4  . PAD (peripheral artery disease) ([place])    a. 2010 PTA & stent to left CIA & left SFA; b. 05/2012 PTA & stent to distal LCIA; c. 10/2012 thrombectomy and stents to [place]; d. 11/2014 s/p Ao-Bifem bypass.  . Schizophrenia ([place])   . Seizures ([place])    "when I get anxious" (Stated  last seizure three months ago)  . Tobacco abuse   . Urinary frequency     Past Surgical History:  Procedure Laterality Date  . ABDOMINAL AORTAGRAM N/A 10/22/2012   Procedure: ABDOMINAL Maxcine Ham;  Surgeon: Wellington Hampshire, MD;  Location: [facility];  Service: Cardiovascular;  Laterality: N/A;  . ABDOMINAL AORTAGRAM N/A 11/11/2014   Procedure: ABDOMINAL Maxcine Ham;  Surgeon: Wellington Hampshire, MD;  Location: [facility];  Service: Cardiovascular;  Laterality: N/A;  . ANTERIOR CERVICAL DECOMP/DISCECTOMY FUSION  01/2011   plate & screw placement   . ANTERIOR CERVICAL DECOMP/DISCECTOMY FUSION N/A 01/26/2017   Procedure: ANTERIOR CERVICAL DECOMPRESSION/DISCECTOMY FUSION CERVICAL FOUR- CERVICAL FIVE;  Surgeon: Ashok Pall, MD;  Location: [facility];  Service: Neurosurgery;  Laterality: N/A;  ANTERIOR CERVICAL DECOMPRESSION/DISCECTOMY FUSION CERVICAL FOUR- CERVICAL FIVE  . AORTA - BILATERAL FEMORAL ARTERY BYPASS GRAFT N/A 11/20/2014   Procedure: AORTA BIFEMORAL BYPASS GRAFT;  Surgeon: Mal Misty, MD;  Location: [facility];  Service: Vascular;  Laterality: N/A;  . BACK SURGERY    . BRAIN SURGERY  1989   subdural hematoma  . CARDIAC CATHETERIZATION     Stent - 12  . CHOLECYSTECTOMY N/A 06/29/2017   Procedure: LAPAROSCOPIC CHOLECYSTECTOMY WITH INTRAOPERATIVE CHOLANGIOGRAM, POSSIBLE OPEN;  Surgeon: Robert Bellow, MD;  Location: [facility];  Service: General;  Laterality: N/A;  . CORONARY ANGIOPLASTY WITH STENT PLACEMENT  03/2007   to RCA  . CORONARY/GRAFT ACUTE MI REVASCULARIZATION N/A 03/07/2018   Procedure: Coronary/Graft Acute MI Revascularization;  Surgeon: Isaias Cowman, MD;  Location: [facility];  Service: Cardiovascular;  Laterality: N/A;  . HERNIA REPAIR    . ILIAC ARTERY STENT  10/22/2012   "2" (10/22/2012)  . ILIAC ARTERY STENT  06/2012   left  . INCISIONAL HERNIA REPAIR N/A 11/08/2015   Procedure: INCISIONAL HERNIA REPAIR WITH MYOFASCIAL RELEASE;  Surgeon: Rolm Bookbinder,  MD;  Location: [facility];  Service: General;  Laterality: N/A;  . INSERTION OF MESH N/A 11/08/2015   Procedure: INSERTION OF MESH;  Surgeon: Rolm Bookbinder, MD;  Location: [facility];  Service: General;  Laterality: N/A;  . LEFT HEART CATH AND CORONARY ANGIOGRAPHY N/A 03/07/2018   Procedure: LEFT HEART CATH AND CORONARY ANGIOGRAPHY;  Surgeon: Isaias Cowman, MD;  Location: [facility];  Service: Cardiovascular;  Laterality: N/A;  . LEFT HEART CATHETERIZATION WITH CORONARY ANGIOGRAM N/A 05/29/2012   Procedure: LEFT HEART CATHETERIZATION WITH CORONARY ANGIOGRAM;  Surgeon: Wellington Hampshire, MD;  Location: [facility];  Service: Cardiovascular;  Laterality: N/A;  . LIVER BIOPSY  10/2012  . LOWER EXTREMITY ANGIOGRAM N/A 05/29/2012   Procedure: LOWER EXTREMITY ANGIOGRAM;  Surgeon: Wellington Hampshire, MD;  Location: [facility];  Service: Cardiovascular;  Laterality: N/A;  . MICROLARYNGOSCOPY WITH CO2 LASER AND EXCISION OF VOCAL CORD LESION N/A 11/21/2017   Procedure: MICROLARYNGOSCOPY WITH EXCISION OF VOCAL CORD LESION;  Surgeon: Jerrell Belfast,  MD;  Location: [facility];  Service: ENT;  Laterality: N/A;  . PERCUTANEOUS STENT INTERVENTION Left 05/29/2012   Procedure: PERCUTANEOUS STENT INTERVENTION;  Surgeon: Wellington Hampshire, MD;  Location: [facility];  Service: Cardiovascular;  Laterality: Left;  . POSTERIOR FUSION CERVICAL SPINE  2012  . SUBDURAL HEMATOMA EVACUATION VIA CRANIOTOMY       reports that he has been smoking cigarettes. He has a 80.00 pack-year smoking history. He has never used smokeless tobacco. He reports current alcohol use. He reports current drug use. Drug: Marijuana.  Allergies  Allergen Reactions  . Lisinopril Other (See Comments)    Other reaction(s): Elevated creatine and very elevated potassium     Family History  Problem Relation Age of Onset  . Emphysema Father   . COPD Mother   . Diabetes Other   . Alcohol abuse Other   . Hypertension Other   . Hyperlipidemia  Other   . Seizures Other      Prior to Admission medications   Medication Sig Start Date End Date Taking? Authorizing Provider  alprazolam Duanne Moron) 2 MG tablet Take 2 mg by mouth 3 (three) times daily. 10/04/18  Yes [provider]  aspirin EC 81 MG EC tablet Take 1 tablet (81 mg total) by mouth daily. 08/29/19   Clapacs, Madie Reno, MD  atorvastatin (LIPITOR) 40 MG tablet Take 1 tablet (40 mg total) by mouth daily at 6 PM. 08/28/19   Clapacs, Madie Reno, MD  cariprazine 4.5 MG CAPS Take 1 capsule (4.5 mg total) by mouth daily. 08/28/19   Clapacs, Madie Reno, MD  carvedilol (COREG) 3.125 MG tablet Take 1 tablet (3.125 mg total) by mouth 2 (two) times daily with a meal. 08/28/19   Clapacs, Madie Reno, MD  divalproex (DEPAKOTE ER) 500 MG 24 hr tablet Take 2 tablets (1,000 mg total) by mouth at bedtime. Patient taking differently: Take 500 mg by mouth 2 (two) times daily.  08/28/19   Clapacs, Madie Reno, MD  irbesartan (AVAPRO) 150 MG tablet Take 1 tablet (150 mg total) by mouth daily. Patient not taking: Reported on 02/21/2020 08/29/19   Clapacs, Madie Reno, MD  lithium carbonate (ESKALITH) 450 MG CR tablet Take 1 tablet (450 mg total) by mouth every 12 (twelve) hours. 08/28/19   Clapacs, Madie Reno, MD  pantoprazole (PROTONIX) 40 MG tablet Take 1 tablet (40 mg total) by mouth daily at 12 noon. 08/28/19   Clapacs, Madie Reno, MD  thiamine 100 MG tablet Take 1 tablet (100 mg total) by mouth daily. Patient not taking: Reported on 02/21/2020 08/29/19   Clapacs, Madie Reno, MD  ticagrelor (BRILINTA) 60 MG TABS tablet Take 1 tablet (60 mg total) by mouth 2 (two) times daily. 08/28/19   Clapacs, Madie Reno, MD  valsartan (DIOVAN) 40 MG tablet Take 40 mg by mouth daily. 01/08/20   [provider]    Physical Exam: Vitals:   10/13/20 1130 10/13/20 1215 10/13/20 1218 10/13/20 1230  BP: 127/77 (!) 151/106 (!) 151/106 (!) 122/102  Pulse: 73  84 77  Resp: 15 16  15   Temp:      TempSrc:      SpO2: 98%   99%  Weight:      Height:          Vitals:   10/13/20 1130 10/13/20 1215 10/13/20 1218 10/13/20 1230  BP: 127/77 (!) 151/106 (!) 151/106 (!) 122/102  Pulse: 73  84 77  Resp: 15 16  15   Temp:  TempSrc:      SpO2: 98%   99%  Weight:      Height:        Constitutional: NAD, alert and oriented x 3.  Chronically ill-appearing Eyes: PERRL, lids and conjunctivae normal ENMT: Mucous membranes are moist.  Neck: normal, supple, no masses, no thyromegaly Respiratory: clear to auscultation bilaterally, no wheezing, no crackles. Normal respiratory effort. No accessory muscle use.  Cardiovascular: Regular rate and rhythm, no murmurs / rubs / gallops. No extremity edema. 2+ pedal pulses. No carotid bruits.  Abdomen: no tenderness, no masses palpated. No hepatosplenomegaly. Bowel sounds positive.  Musculoskeletal: no clubbing / cyanosis. No joint deformity upper and lower extremities.  Skin: no rashes, lesions, ulcers.  Neurologic: No gross focal neurologic deficit. Psychiatric: Normal mood and affect.   Labs on Admission: I have personally reviewed following labs and imaging studies  CBC: Recent Labs  Lab 10/13/20 0917  WBC 5.9  HGB 16.2  HCT 47.0  MCV 97.1  PLT 623*   Basic Metabolic Panel: Recent Labs  Lab 10/13/20 0917  NA 140  K 4.3  CL 103  CO2 28  GLUCOSE 112*  BUN 6  CREATININE 0.73  CALCIUM 9.8   GFR: Estimated Creatinine Clearance: 75.6 mL/min (by C-G formula based on SCr of 0.73 mg/dL). Liver Function Tests: No results for input(s): AST, ALT, ALKPHOS, BILITOT, PROT, ALBUMIN in the last 168 hours. No results for input(s): LIPASE, AMYLASE in the last 168 hours. No results for input(s): AMMONIA in the last 168 hours. Coagulation Profile: No results for input(s): INR, PROTIME in the last 168 hours. Cardiac Enzymes: No results for input(s): CKTOTAL, CKMB, CKMBINDEX, TROPONINI in the last 168 hours. BNP (last 3 results) No results for input(s): PROBNP in the last 8760 hours. HbA1C: No  results for input(s): HGBA1C in the last 72 hours. CBG: Recent Labs  Lab 10/13/20 0927 10/13/20 1303  GLUCAP 104* 91   Lipid Profile: No results for input(s): CHOL, HDL, LDLCALC, TRIG, CHOLHDL, LDLDIRECT in the last 72 hours. Thyroid Function Tests: No results for input(s): TSH, T4TOTAL, FREET4, T3FREE, THYROIDAB in the last 72 hours. Anemia Panel: No results for input(s): VITAMINB12, FOLATE, FERRITIN, TIBC, IRON, RETICCTPCT in the last 72 hours. Urine analysis:    Component Value Date/Time   COLORURINE YELLOW 03/27/2016 1100   APPEARANCEUR CLEAR 03/27/2016 1100   APPEARANCEUR Clear 01/09/2014 1808   LABSPEC 1.010 03/27/2016 1100   LABSPEC 1.014 01/09/2014 1808   PHURINE 5.5 03/27/2016 1100   GLUCOSEU NEGATIVE 03/27/2016 1100   GLUCOSEU Negative 01/09/2014 1808   HGBUR NEGATIVE 03/27/2016 1100   BILIRUBINUR NEGATIVE 03/27/2016 1100   BILIRUBINUR Negative 01/09/2014 1808   KETONESUR NEGATIVE 03/27/2016 1100   PROTEINUR NEGATIVE 03/27/2016 1100   UROBILINOGEN 0.2 08/24/2015 1305   NITRITE NEGATIVE 03/27/2016 1100   LEUKOCYTESUR NEGATIVE 03/27/2016 1100   LEUKOCYTESUR Negative 01/09/2014 1808    Radiological Exams on Admission: DG Chest 2 View  Result Date: 10/13/2020 CLINICAL DATA:  Chest pain EXAM: CHEST - 2 VIEW COMPARISON:  08/25/2019 chest radiograph and prior. FINDINGS: No focal consolidation, pneumothorax or pleural effusion. Cardiomediastinal silhouette within normal limits of size. Sequela of cardiac stent placement. Remote bilateral rib posttraumatic deformities. Sequela of ACDF. IMPRESSION: No focal airspace disease. Electronically Signed   By: Primitivo Gauze M.D.   On: 10/13/2020 10:03   CT Head Wo Contrast  Result Date: 10/13/2020 CLINICAL DATA:  Paresthesia.  Numbness and tingling. EXAM: CT HEAD WITHOUT CONTRAST TECHNIQUE: Contiguous axial images were  obtained from the base of the skull through the vertex without intravenous contrast. COMPARISON:   08/25/2019 FINDINGS: Brain: Moderate volume loss. No evidence of acute focal infarction. Old small vessel infarction in the right basal ganglia. Mild chronic small-vessel changes of the cerebral hemispheric white matter. No mass, hemorrhage, hydrocephalus or extra-axial collection. Old right frontoparietal craniotomy. Vascular: There is atherosclerotic calcification of the major vessels at the base of the brain. Skull: Old right frontoparietal craniotomy. Sinuses/Orbits: Clear/normal Other: None IMPRESSION: No acute finding by CT. Atrophy and chronic small-vessel ischemic changes. Old right frontoparietal craniotomy. Old small vessel infarction in the right basal ganglia. Electronically Signed   By: Nelson Chimes M.D.   On: 10/13/2020 11:02   CT ANGIO CHEST AORTA W/CM & OR WO/CM  Result Date: 10/13/2020 CLINICAL DATA:  Nonspecific chest pain. Smoking history. History of head neck cancer. EXAM: CT ANGIOGRAPHY CHEST WITH CONTRAST TECHNIQUE: Multidetector CT imaging of the chest was performed using the standard protocol during bolus administration of intravenous contrast. Multiplanar CT image reconstructions and MIPs were obtained to evaluate the vascular anatomy. CONTRAST:  32mL OMNIPAQUE IOHEXOL 350 MG/ML SOLN COMPARISON:  Radiography same day.  CT chest 03/21/2018 FINDINGS: Cardiovascular: The heart is not enlarged. No pericardial effusion. Coronary artery calcification and stents. There is aortic atherosclerotic calcification but no aneurysm or dissection. Pulmonary arterial opacification is moderate. No pulmonary emboli are identified. Mediastinum/Nodes: No hilar mass or adenopathy. Lungs/Pleura: No evidence of infiltrate, collapse or effusion. Two tiny pulmonary nodule seen previously, 1 in the right lower lobe in the other in the left upper lobe are no longer visible on must have been inflammatory. No new pulmonary nodule or mass. Upper Abdomen: Scattered hepatic low densities as seen previously most  consistent with cysts. No acute upper abdominal finding. Musculoskeletal: Multiple old thoracic compression deformities. Inferior endplate fracture at T9 which was not seen previously and could be recent. Review of the MIP images confirms the above findings. IMPRESSION: 1. No pulmonary emboli, aortic dissection or other acute chest pathology. 2. Coronary artery calcification and stents. Aortic atherosclerosis. 3. Multiple old thoracic compression deformities. Inferior endplate fracture at T9 which was not seen previously and could be recent. 4. No acute or significant pulmonary finding. Aortic Atherosclerosis (ICD10-I70.0). Electronically Signed   By: Nelson Chimes M.D.   On: 10/13/2020 11:00    EKG: Independently reviewed.  Sinus rhythm  Assessment/Plan Principal Problem:   Chest pain Active Problems:   TOBACCO USER   CAD, NATIVE VESSEL   HTN (hypertension)   Cardiomyopathy, ischemic   ETOH abuse   Malignant neoplasm of glottis (HCC)     Chest pain In a patient with known coronary artery disease status post stent angioplasty who presents with left upper anterior wall chest pain with radiation to the left arm associated with nausea Obtain serial cardiac enzymes Continue aspirin, statins and beta-blockers Obtain 2D echocardiogram Request cardiology consult   Hypertension Continue carvedilol   History of alcohol abuse Patient admits to daily alcohol use and symptoms of alcohol withdrawal when he does not drink His last drink was about 4 hours prior to his admission We will place patient on CIWA protocol administer Ativan for CIWA score of 8 or greater Place patient on thiamine, MVI and folic acid Patient has been counseled to abstain from further alcohol use   Nicotine dependence Patient smokes 1 pack of cigarettes daily He declines a nicotine transdermal patch at this time   History of malignant neoplasm of the glottis Patient has  completed radiation therapy   DVT  prophylaxis: Lovenox Code Status: DO NOT RESUSCITATE Family Communication: Greater than 50% of time was spent discussing plan of care with patient at the bedside.  He verbalizes understanding and agrees with the plan. Disposition Plan: Back to previous home environment Consults called: Cardiology    Daris Harkins MD [facility]     10/13/2020, 1:21 PM

## 2020-10-13 NOTE — ED Notes (Signed)
Lunch tray given. 

## 2020-10-13 NOTE — ED Provider Notes (Addendum)
[facility] Emergency Department Provider Note   ____________________________________________   First MD Initiated Contact with Patient 10/13/20 [phone]     (approximate)  I have reviewed the triage vital signs and the nursing notes.   HISTORY  Chief Complaint Chest Pain and Shortness of Breath    HPI [name] is a 59 y.o. male history of previous MI, schizophrenia  Patient reports 2 days ago began experiencing pain severe in his left upper chest.  He did not seek medical care.  The pain is continued and seems to shoot pain in his left arm and also causing now a feeling of numbness over the left arm and somewhat in his left face.  No fevers or chills.  Denies recent illness.  Does take lithium, took 2 nitroglycerin at home without relief today.  He does not take aspirin or other prescribed medications except for his lithium.  Reports he is noncompliant with other medications.  Has had previous "for heart attacks".  Additionally he does report that he is an alcoholic drinks regularly and had his normal last drink this morning.  Denies withdrawals at this time but does report he has a history of going into DTs in the past  No pain in the abdomen or back.  No pain in the neck or head.  No headaches.  Numbness over the left arm he reports he is not quite sure when it started but the pain in the chest definitely started 2 days ago and seems to be worsening   Past Medical History:  Diagnosis Date  . Anxiety   . Bipolar affective ([place])    takes Lithium daily  . Broken fingers   . Coronary artery disease    a. 1997 PCI: RCA ([facility]); b. 2008 PCI: RCA; c. 05/2012 Cath: LCx 100 (previously stented - ? date), RCA stent w/ mild ISR, LAD nonobs, EF 35%; d. 06/2013 Cath: stable anatomy; e. 03/2018 Inf STEMI/PCI: LCX 100o/p ISR (CTO), RCA 152m/d (3.0x38 Resolute Onyx DES), EF 40-45%.   . Depression   . GERD (gastroesophageal reflux disease)    takes Protonix daily  .  Hepatitis C    "diagnosed in the past 2 wk" (10/22/2012)  . History of ETOH abuse    quit 2007  . History of radiation therapy 12/17/17- 01/24/18   Larynx treated to 63 gy in 28 fx of 2.25 Gy  . Hyperlipidemia   . Hypertension    "pt. denies - no longer taking blood pressure medications"  . Ischemic cardiomyopathy    a. 03/2018 LV gram: EF 40-45% @ time of MI.  Marland Kitchen Myocardial infarction ([place])    x 4  . PAD (peripheral artery disease) ([place])    a. 2010 PTA & stent to left CIA & left SFA; b. 05/2012 PTA & stent to distal LCIA; c. 10/2012 thrombectomy and stents to [place]; d. 11/2014 s/p Ao-Bifem bypass.  . Schizophrenia ([place])   . Seizures ([place])    "when I get anxious" (Stated last seizure three months ago)  . Tobacco abuse   . Urinary frequency     Patient Active Problem List   Diagnosis Date Noted  . Alcohol-induced mood disorder ([place]) 02/21/2020  . Bipolar disorder current episode depressed ([place]) 08/26/2019  . Coronary artery disease involving native heart with angina pectoris ([place])   . Seizure ([place]) 08/25/2019  . Bipolar affective disorder, depressed, severe ([place]) 08/25/2019  . Chest pain   . Malnutrition of moderate degree 03/09/2018  .  STEMI involving oth coronary artery of inferior wall ([place]) 03/07/2018  . 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 Cholecystitis 06/29/2017  . Osteoarthritis of spine with radiculopathy, cervical region 01/26/2017  . Gallbladder polyp 06/01/2016  . Rib pain on right side 06/01/2016  . ETOH abuse 11/24/2014  . Bipolar disorder, currently in remission ([place]) 11/24/2014  . Alcohol dependence with withdrawal with complication ([name]) 22/97/9892  . Chronic hepatitis C ([place]) 09/12/2013  . Cardiomyopathy, ischemic 06/09/2013  . PAD (peripheral artery disease) ([place]) 05/23/2012  . HTN (hypertension) 10/09/2011  . CAROTID BRUIT 12/19/2010  . DYSPNEA 06/15/2010  . Hyperlipidemia 05/06/2010  . TOBACCO USER  05/06/2010  . CAD, NATIVE VESSEL 05/06/2010  . 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 ABDOMINAL AORTAGRAM N/A 10/22/2012   Procedure: ABDOMINAL Maxcine Ham;  Surgeon: Wellington Hampshire, MD;  Location: [place] CATH LAB;  Service: Cardiovascular;  Laterality: N/A;  . ABDOMINAL AORTAGRAM N/A 11/11/2014   Procedure: ABDOMINAL Maxcine Ham;  Surgeon: Wellington Hampshire, MD;  Location: [place] CATH LAB;  Service: Cardiovascular;  Laterality: N/A;  . ANTERIOR CERVICAL DECOMP/DISCECTOMY FUSION  01/2011   plate & screw placement   . ANTERIOR CERVICAL DECOMP/DISCECTOMY FUSION N/A 01/26/2017   Procedure: ANTERIOR CERVICAL DECOMPRESSION/DISCECTOMY FUSION CERVICAL FOUR- CERVICAL FIVE;  Surgeon: Ashok Pall, MD;  Location: [place];  Service: Neurosurgery;  Laterality: N/A;  ANTERIOR CERVICAL DECOMPRESSION/DISCECTOMY FUSION CERVICAL FOUR- CERVICAL FIVE  . AORTA - BILATERAL FEMORAL ARTERY BYPASS GRAFT N/A 11/20/2014   Procedure: AORTA BIFEMORAL BYPASS GRAFT;  Surgeon: Mal Misty, MD;  Location: [place];  Service: Vascular;  Laterality: N/A;  . BACK SURGERY    . BRAIN SURGERY  1989   subdural hematoma  . CARDIAC CATHETERIZATION     Stent - 12  . CHOLECYSTECTOMY N/A 06/29/2017   Procedure: LAPAROSCOPIC CHOLECYSTECTOMY WITH INTRAOPERATIVE CHOLANGIOGRAM, POSSIBLE OPEN;  Surgeon: Robert Bellow, MD;  Location: [facility] ORS;  Service: General;  Laterality: N/A;  . CORONARY ANGIOPLASTY WITH STENT PLACEMENT  03/2007   to RCA  . CORONARY/GRAFT ACUTE MI REVASCULARIZATION N/A 03/07/2018   Procedure: Coronary/Graft Acute MI Revascularization;  Surgeon: Isaias Cowman, MD;  Location: [facility] CV LAB;  Service: Cardiovascular;  Laterality: N/A;  . HERNIA REPAIR    . ILIAC ARTERY STENT  10/22/2012   "2" (10/22/2012)  . ILIAC ARTERY STENT  06/2012   left  . INCISIONAL HERNIA REPAIR N/A 11/08/2015   Procedure: INCISIONAL HERNIA  REPAIR WITH MYOFASCIAL RELEASE;  Surgeon: Rolm Bookbinder, MD;  Location: [place];  Service: General;  Laterality: N/A;  . INSERTION OF MESH N/A 11/08/2015   Procedure: INSERTION OF MESH;  Surgeon: Rolm Bookbinder, MD;  Location: [place];  Service: General;  Laterality: N/A;  . LEFT HEART CATH AND CORONARY ANGIOGRAPHY N/A 03/07/2018   Procedure: LEFT HEART CATH AND CORONARY ANGIOGRAPHY;  Surgeon: Isaias Cowman, MD;  Location: [place] CV LAB;  Service: Cardiovascular;  Laterality: N/A;  . LEFT HEART CATHETERIZATION WITH CORONARY ANGIOGRAM N/A 05/29/2012   Procedure: LEFT HEART CATHETERIZATION WITH CORONARY ANGIOGRAM;  Surgeon: Wellington Hampshire, MD;  Location: [place] C[facility] LAB;  Service: Cardiovascular;  Laterality: N/A;  . LIVER BIOPSY  10/2012  . LOWER EXTREMITY ANGIOGRAM N/A 05/29/2012   Procedure: LOWER EXTREMITY ANGIOGRAM;  Surgeon: Wellington Hampshire, MD;  Location: [facility] C[facility] LAB;  Service: Cardiovascular;  Laterality: N/A;  . MICROLARYNGOSCOPY WITH CO2 LASER AND  EXCISION OF VOCAL CORD LESION N/A 11/21/2017   Procedure: MICROLARYNGOSCOPY WITH EXCISION OF VOCAL CORD LESION;  Surgeon: Jerrell Belfast, MD;  Location: [place];  Service: ENT;  Laterality: N/A;  . PERCUTANEOUS STENT INTERVENTION Left 05/29/2012   Procedure: PERCUTANEOUS STENT INTERVENTION;  Surgeon: Wellington Hampshire, MD;  Location: [place] CATH LAB;  Service: Cardiovascular;  Laterality: Left;  . POSTERIOR FUSION CERVICAL SPINE  2012  . SUBDURAL HEMATOMA EVACUATION VIA CRANIOTOMY      Prior to Admission medications   Medication Sig Start Date End Date Taking? Authorizing Provider  alprazolam Duanne Moron) 2 MG tablet Take 2 mg by mouth 3 (three) times daily. 10/04/18  Yes [provider]  aspirin EC 81 MG EC tablet Take 1 tablet (81 mg total) by mouth daily. 08/29/19   Clapacs, Madie Reno, MD  atorvastatin (LIPITOR) 40 MG tablet Take 1 tablet (40 mg total) by mouth daily at 6 PM. 08/28/19   Clapacs, Madie Reno, MD  cariprazine 4.5 MG CAPS Take  1 capsule (4.5 mg total) by mouth daily. 08/28/19   Clapacs, Madie Reno, MD  carvedilol (COREG) 3.125 MG tablet Take 1 tablet (3.125 mg total) by mouth 2 (two) times daily with a meal. 08/28/19   Clapacs, Madie Reno, MD  divalproex (DEPAKOTE ER) 500 MG 24 hr tablet Take 2 tablets (1,000 mg total) by mouth at bedtime. Patient taking differently: Take 500 mg by mouth 2 (two) times daily.  08/28/19   Clapacs, Madie Reno, MD  irbesartan (AVAPRO) 150 MG tablet Take 1 tablet (150 mg total) by mouth daily. Patient not taking: Reported on 02/21/2020 08/29/19   Clapacs, Madie Reno, MD  lithium carbonate (ESKALITH) 450 MG CR tablet Take 1 tablet (450 mg total) by mouth every 12 (twelve) hours. 08/28/19   Clapacs, Madie Reno, MD  pantoprazole (PROTONIX) 40 MG tablet Take 1 tablet (40 mg total) by mouth daily at 12 noon. 08/28/19   Clapacs, Madie Reno, MD  thiamine 100 MG tablet Take 1 tablet (100 mg total) by mouth daily. Patient not taking: Reported on 02/21/2020 08/29/19   Clapacs, Madie Reno, MD  ticagrelor (BRILINTA) 60 MG TABS tablet Take 1 tablet (60 mg total) by mouth 2 (two) times daily. 08/28/19   Clapacs, Madie Reno, MD  valsartan (DIOVAN) 40 MG tablet Take 40 mg by mouth daily. 01/08/20   [provider]    Allergies Lisinopril  Family History  Problem Relation Age of Onset  . Emphysema Father   . COPD Mother   . Diabetes Other   . Alcohol abuse Other   . Hypertension Other   . Hyperlipidemia Other   . Seizures Other     Social History Social History   Tobacco Use  . Smoking status: Current Every Day Smoker    Packs/day: 2.00    Years: 40.00    Pack years: 80.00    Types: Cigarettes  . Smokeless tobacco: Never Used  . Tobacco comment: "ive slowed down recently"   Vaping Use  . Vaping Use: Former  Substance Use Topics  . Alcohol use: Yes    Alcohol/week: 0.0 standard drinks    Comment: 1/2 gallon or 20 beers daily  . Drug use: Yes    Types: Marijuana    Comment: last smoked 1 month ago    Review of  Systems Constitutional: No fever/chills Eyes: No visual changes. ENT: No sore throat.  Chronic hoarseness of voice due to previous throat cancer. Cardiovascular: See HPI Respiratory: Denies shortness of breath.  Gastrointestinal: No abdominal pain.  Did vomit reportedly due to the pain a couple times today Genitourinary: Negative for dysuria. Musculoskeletal: Negative for back pain.  See HPI Skin: Negative for rash. Neurological: Negative for headaches, areas of focal weakness or numbness except numbness over the left arm from the shoulder all the way to the hand but denies any weakness in that area.    ____________________________________________   PHYSICAL EXAM:  VITAL SIGNS: ED Triage Vitals  Enc Vitals Group     BP 10/13/20 0912 (!) 155/88     Pulse Rate 10/13/20 0912 88     Resp 10/13/20 0912 18     Temp 10/13/20 0912 (!) 97.5 F (36.4 C)     Temp Source 10/13/20 0912 Oral     SpO2 10/13/20 0912 100 %     Weight 10/13/20 0912 120 lb (54.4 kg)     Height 10/13/20 0912 5\' 7"  (1.702 m)     Head Circumference --      Peak Flow --      Pain Score 10/13/20 0911 10     Pain Loc --      Pain Edu? --      Excl. in [place]? --     Constitutional: Alert and oriented.  Ill-appearing, somewhat in distress appears to be painful.  No respiratory distress.  Holding hand over his left upper chest Eyes: Conjunctivae are normal. Head: Atraumatic. Nose: No congestion/rhinnorhea. Mouth/Throat: Mucous membranes are moist. Neck: No stridor.  Cardiovascular: Normal rate, regular rhythm. Grossly normal heart sounds.  Good peripheral circulation.  No reproducible tenderness to the left chest wall. Respiratory: Normal respiratory effort.  No retractions. Lungs CTAB. Gastrointestinal: Soft and nontender. No distention. Musculoskeletal: No lower extremity tenderness nor edema. Neurologic:  Normal speech and language. No gross focal neurologic deficits are appreciated except he reports no sensation  over the left arm and also decreased sensation over the left face but demonstrates good motor use of all extremities and no facial droop.  Skin:  Skin is warm, dry and intact. No rash noted. Psychiatric: Mood and affect are somewhat anxious. Speech and behavior are normal.  Voice is somewhat hoarse but he reports this to be chronic  ____________________________________________   LABS (all labs ordered are listed, but only abnormal results are displayed)  Labs Reviewed  BASIC METABOLIC PANEL - Abnormal; Notable for the following components:      Result Value   Glucose, Bld 112 (*)    All other components within normal limits  CBC - Abnormal; Notable for the following components:   Platelets 148 (*)    All other components within normal limits  LITHIUM LEVEL - Abnormal; Notable for the following components:   Lithium Lvl 0.12 (*)    All other components within normal limits  URINE DRUG SCREEN, QUALITATIVE ([facility] ONLY) - Abnormal; Notable for the following components:   Opiate, Ur Screen POSITIVE (*)    Cannabinoid 50 Ng, Ur [place] POSITIVE (*)    All other components within normal limits  CBG MONITORING, ED - Abnormal; Notable for the following components:   Glucose-Capillary 104 (*)    All other components within normal limits  RESPIRATORY PANEL BY RT PCR (FLU A&B, COVID)  CBG MONITORING, ED  TROPONIN I (HIGH SENSITIVITY)  TROPONIN I (HIGH SENSITIVITY)   ____________________________________________  EKG  Initial EKG is reviewed by me at 9:10 AM Heart rate 95 QRS 90 QTc 25 Probable old inferior infarct, no evidence of acute MI did noted  no ST elevation Repeat EKG performed at 940 Heart rate 80 QRS 100 QTc 440 Normal sinus rhythm, again noted evidence of old inferior, but no acute elevation.  Slight T wave inversion now present in V5 and V6, more apparent now than on previous EKG potentially indicative of ischemia but no  STEMI ____________________________________________  RADIOLOGY  DG Chest 2 View  Result Date: 10/13/2020 CLINICAL DATA:  Chest pain EXAM: CHEST - 2 VIEW COMPARISON:  08/25/2019 chest radiograph and prior. FINDINGS: No focal consolidation, pneumothorax or pleural effusion. Cardiomediastinal silhouette within normal limits of size. Sequela of cardiac stent placement. Remote bilateral rib posttraumatic deformities. Sequela of ACDF. IMPRESSION: No focal airspace disease. Electronically Signed   By: Primitivo Gauze M.D.   On: 10/13/2020 10:03   CT Head Wo Contrast  Result Date: 10/13/2020 CLINICAL DATA:  Paresthesia.  Numbness and tingling. EXAM: CT HEAD WITHOUT CONTRAST TECHNIQUE: Contiguous axial images were obtained from the base of the skull through the vertex without intravenous contrast. COMPARISON:  08/25/2019 FINDINGS: Brain: Moderate volume loss. No evidence of acute focal infarction. Old small vessel infarction in the right basal ganglia. Mild chronic small-vessel changes of the cerebral hemispheric white matter. No mass, hemorrhage, hydrocephalus or extra-axial collection. Old right frontoparietal craniotomy. Vascular: There is atherosclerotic calcification of the major vessels at the base of the brain. Skull: Old right frontoparietal craniotomy. Sinuses/Orbits: Clear/normal Other: None IMPRESSION: No acute finding by CT. Atrophy and chronic small-vessel ischemic changes. Old right frontoparietal craniotomy. Old small vessel infarction in the right basal ganglia. Electronically Signed   By: Nelson Chimes M.D.   On: 10/13/2020 11:02   CT ANGIO CHEST AORTA W/CM & OR WO/CM  Result Date: 10/13/2020 CLINICAL DATA:  Nonspecific chest pain. Smoking history. History of head neck cancer. EXAM: CT ANGIOGRAPHY CHEST WITH CONTRAST TECHNIQUE: Multidetector CT imaging of the chest was performed using the standard protocol during bolus administration of intravenous contrast. Multiplanar CT image  reconstructions and MIPs were obtained to evaluate the vascular anatomy. CONTRAST:  72mL OMNIPAQUE IOHEXOL 350 MG/ML SOLN COMPARISON:  Radiography same day.  CT chest 03/21/2018 FINDINGS: Cardiovascular: The heart is not enlarged. No pericardial effusion. Coronary artery calcification and stents. There is aortic atherosclerotic calcification but no aneurysm or dissection. Pulmonary arterial opacification is moderate. No pulmonary emboli are identified. Mediastinum/Nodes: No hilar mass or adenopathy. Lungs/Pleura: No evidence of infiltrate, collapse or effusion. Two tiny pulmonary nodule seen previously, 1 in the right lower lobe in the other in the left upper lobe are no longer visible on must have been inflammatory. No new pulmonary nodule or mass. Upper Abdomen: Scattered hepatic low densities as seen previously most consistent with cysts. No acute upper abdominal finding. Musculoskeletal: Multiple old thoracic compression deformities. Inferior endplate fracture at T9 which was not seen previously and could be recent. Review of the MIP images confirms the above findings. IMPRESSION: 1. No pulmonary emboli, aortic dissection or other acute chest pathology. 2. Coronary artery calcification and stents. Aortic atherosclerosis. 3. Multiple old thoracic compression deformities. Inferior endplate fracture at T9 which was not seen previously and could be recent. 4. No acute or significant pulmonary finding. Aortic Atherosclerosis (ICD10-I70.0). Electronically Signed   By: Nelson Chimes M.D.   On: 10/13/2020 11:00     Imaging studies reviewed, no acute emboli or dissection in the chest.  Noted are coronary artery calcifications though.  Full descriptive findings as above by Dr. Maree Erie ____________________________________________   PROCEDURES  Procedure(s) performed: None  Procedures  Critical Care  performed: No  ____________________________________________   INITIAL IMPRESSION / ASSESSMENT AND PLAN / ED  COURSE  Pertinent labs & imaging results that were available during my care of the patient were reviewed by me and considered in my medical decision making (see chart for details).   Differential diagnosis includes, but is not limited to, ACS, aortic dissection, pulmonary embolism, cardiac tamponade, pneumothorax, pneumonia, pericarditis, myocarditis, GI-related causes including esophagitis/gastritis, and musculoskeletal chest wall pain.    Additionally, patient complains of left arm numbness as well as some left facial numbness with their own associated with severe left chest pain I suspect at some type of associated paresthesias he has no obvious motor deficits but certainly stroke cannot be excluded based on presentation examination alone, but if this was he would be well outside of any TPA window given symptoms for 2 to 3 days.  I highly doubt however this would represent acute stroke  Clinical Course as of Oct 13 1314  Wed Oct 13, 2020  1056 Patient reports left arm numbness started about 2 to 3 days ago as well at the same time as chest pain.   [MQ]  1057 Strong left radial pulse normal left hand perfusion.   [MQ]  1217 I just spoke with the patient, he is agreeable to staying.  He is starting to experience alcohol withdrawal symptoms and I have ordered Ativan.  Currently obtaining CIWA score. At present agrees to admit but needed alcohol symptoms managed.   [MQ]    Clinical Course User Index [MQ] Delman Kitten, MD   ----------------------------------------- 11:30 AM on 10/13/2020 -----------------------------------------  Patient pain improving with morphine.  Patient agreeable understand plan for admission for further work-up.  Admission discussed with Dr. Francine Graven  ____________________________________________   FINAL CLINICAL IMPRESSION(S) / ED DIAGNOSES  Final diagnoses:  Atypical chest pain  Chest pain with moderate risk of acute coronary syndrome  Left hand paresthesia         Note:  This document was prepared using Dragon voice recognition software and may include unintentional dictation errors        Delman Kitten, MD 10/13/20 1130   ----------------------------------------- 1:15 PM on 10/13/2020 -----------------------------------------  Patient began experiencing symptoms alcohol withdrawal, now being treated with improvement in tremulousness.  After receiving benzodiazepine however he does seem to be slightly unstable on his feet when attempting to ambulate, will place on fall precautions and sitter to redirect patient.  He does report a history of heavy alcohol abuse and active alcoholism, and I suspect he is now being to demonstrate some evidence of withdrawal.  Dr. Francine Graven aware and updating admission    Delman Kitten, MD 10/13/20 1316

## 2020-10-13 NOTE — ED Notes (Signed)
Resumed care from [place] rn. Pt alert. Pt states he wants to leave.  Dr Francine Graven attending aware.  Pt has a Actuary.

## 2020-10-13 NOTE — ED Triage Notes (Signed)
Pt reports intermittent CP that started 2 days ago to his left chest, sharp nature and radiates down his left arm. Pt reports hx of MI's

## 2020-11-01 ENCOUNTER — Telehealth: Payer: Self-pay | Admitting: *Deleted

## 2020-11-01 NOTE — Telephone Encounter (Signed)
CALLED PATIENT TO ASK ABOUT RESCHEDULING MISSED FU FOR 07-30-20, LVM FOR A RETURN CALL.

## 2020-11-18 ENCOUNTER — Telehealth: Payer: Self-pay

## 2020-11-18 NOTE — Telephone Encounter (Signed)
Patient left a VM asking to be seen by Dr. Isidore Moos ASAP for a worsening sore throat and trouble eating.   Returned patient's call to get more information, and see if it would be more appropriate for patient to see ENT first (Dr. Wilburn Cornelia). Patient stated his symptoms have been going on for about 3 weeks. Denied any fever, chills, congestion, or possible COVID exposure. He just reiterated that his throat is sore and voice more hoarse. Stated he is scheduled to see Dr. Wilburn Cornelia on 12/07/20. Informed patient that Dr. Isidore Moos was out of the office today, but I would relay his message to her. Told patient someone from our office would call him back tomorrow to advise him to do one of the following: come into our clinic at Dr. Pearlie Oyster next availability, simply keep appointment date/time with Dr. Wilburn Cornelia, or see if Dr. Wilburn Cornelia can see patient sooner. Patient verbalized understanding and agreement, and denied any other needs at this time. He knows to call me back should symptoms worsen or he have other questions/concerns.

## 2020-12-13 ENCOUNTER — Other Ambulatory Visit: Payer: Self-pay | Admitting: Otolaryngology

## 2020-12-13 DIAGNOSIS — C329 Malignant neoplasm of larynx, unspecified: Secondary | ICD-10-CM

## 2020-12-16 ENCOUNTER — Other Ambulatory Visit: Payer: Self-pay

## 2020-12-16 ENCOUNTER — Ambulatory Visit
Admission: RE | Admit: 2020-12-16 | Discharge: 2020-12-16 | Disposition: A | Discharge status: Home / Self Care | Payer: Medicaid Other | Source: Ambulatory Visit | Attending: Otolaryngology | Admitting: Otolaryngology

## 2020-12-16 DIAGNOSIS — C329 Malignant neoplasm of larynx, unspecified: Secondary | ICD-10-CM

## 2020-12-16 MED ORDER — IOPAMIDOL (ISOVUE-300) INJECTION 61%
75.0000 mL | Freq: Once | INTRAVENOUS | Status: AC | PRN
  Administered 2020-12-16: 75 mL via INTRAVENOUS

## 2021-01-21 ENCOUNTER — Ambulatory Visit (INDEPENDENT_AMBULATORY_CARE_PROVIDER_SITE_OTHER): Payer: Medicaid Other | Admitting: Nurse Practitioner

## 2021-01-21 ENCOUNTER — Other Ambulatory Visit: Payer: Self-pay

## 2021-01-21 ENCOUNTER — Encounter: Payer: Self-pay | Admitting: Nurse Practitioner

## 2021-01-21 VITALS — BP 176/120 | HR 99 | Ht 67.0 in | Wt 120.0 lb

## 2021-01-21 DIAGNOSIS — I10 Essential (primary) hypertension: Secondary | ICD-10-CM

## 2021-01-21 DIAGNOSIS — I2510 Atherosclerotic heart disease of native coronary artery without angina pectoris: Secondary | ICD-10-CM | POA: Diagnosis not present

## 2021-01-21 DIAGNOSIS — E785 Hyperlipidemia, unspecified: Secondary | ICD-10-CM

## 2021-01-21 DIAGNOSIS — I255 Ischemic cardiomyopathy: Secondary | ICD-10-CM

## 2021-01-21 DIAGNOSIS — F1010 Alcohol abuse, uncomplicated: Secondary | ICD-10-CM

## 2021-01-21 DIAGNOSIS — Z720 Tobacco use: Secondary | ICD-10-CM

## 2021-01-21 DIAGNOSIS — I5020 Unspecified systolic (congestive) heart failure: Secondary | ICD-10-CM | POA: Diagnosis not present

## 2021-01-21 MED ORDER — CARVEDILOL 6.25 MG PO TABS: 6.2500 mg | ORAL_TABLET | Freq: Two times a day (BID) | ORAL | 3 refills | Status: AC

## 2021-01-21 MED ORDER — ATORVASTATIN CALCIUM 40 MG PO TABS: 40.0000 mg | ORAL_TABLET | Freq: Every day | ORAL | 3 refills | Status: AC

## 2021-01-21 MED ORDER — NITROGLYCERIN 0.4 MG SL SUBL: 0.4000 mg | SUBLINGUAL_TABLET | SUBLINGUAL | 3 refills | Status: AC | PRN

## 2021-01-21 MED ORDER — CARVEDILOL 6.25 MG PO TABS: 6.2500 mg | ORAL_TABLET | Freq: Two times a day (BID) | ORAL | 3 refills | Status: DC

## 2021-01-21 MED ORDER — VALSARTAN 40 MG PO TABS: 40.0000 mg | ORAL_TABLET | Freq: Every day | ORAL | 3 refills | Status: AC

## 2021-01-21 MED ORDER — ATORVASTATIN CALCIUM 40 MG PO TABS: 40.0000 mg | ORAL_TABLET | Freq: Every day | ORAL | 3 refills | Status: DC

## 2021-01-21 NOTE — Patient Instructions (Signed)
Medication Instructions:  Your physician has recommended you make the following change in your medication:   INCREASE Carvedilol to 6.25mg  TWICE daily - A new Rx has been sent to your pharmacy   - you may use up current prescription by taking 2 tablets of 3.125 mg TWICE daily   We have sent refills for: Atrovastatin, Valsartan, and Nitroglycerin    Lab Work: None ordered   Testing/Procedures: None ordered   Follow-Up: At [facility], you and your health needs are our priority.  As part of our continuing mission to provide you with exceptional heart care, we have created designated Provider Care Teams.  These Care Teams include your primary Cardiologist (physician) and Advanced Practice Providers (APPs -  Physician Assistants and Nurse Practitioners) who all work together to provide you with the care you need, when you need it.  We recommend signing up for the patient portal called "MyChart".  Sign up information is provided on this After Visit Summary.  MyChart is used to connect with patients for Virtual Visits (Telemedicine).  Patients are able to view lab/test results, encounter notes, upcoming appointments, etc.  Non-urgent messages can be sent to your provider as well.   To learn more about what you can do with MyChart, go to [email or web address].    Your next appointment:   2 week(s) - 3/3 OR 3/4 in afternoon   The format for your next appointment:   In Person  Provider:   Murray Hodgkins, NP

## 2021-01-21 NOTE — Progress Notes (Signed)
Office Visit    Patient Name: [name] Date of Encounter: 01/21/2021  Primary Care Provider:  Gayland Curry, MD Primary Cardiologist:  Kathlyn Sacramento, MD  Chief Complaint    [name] is a 60 y/o male with history of CAD s/p STEMI 2019 w/RCA stent w/mild ISR, DES to LCx, atypical chest pain, anxiety, ischemic cardiomyopathy, alcohol abuse, tobacco abuse, bipolar affective, hepatitis c, and laryngeal cancer requiring radiation who presents today for follow up and medication refills.   Past Medical History    Past Medical History:  Diagnosis Date  . Anxiety   . Bipolar affective ([place])    takes Lithium daily  . Broken fingers   . Coronary artery disease    a. 1997 PCI: RCA ([facility]); b. 2008 PCI: RCA; c. 05/2012 Cath: LCx 100 (previously stented - ? date), RCA stent w/ mild ISR, LAD nonobs, EF 35%; d. 06/2013 Cath: stable anatomy; e. 03/2018 Inf STEMI/PCI: LCX 100o/p ISR (CTO), RCA 131m/d (3.0x38 Resolute Onyx DES), EF 40-45%.   . Depression   . GERD (gastroesophageal reflux disease)    takes Protonix daily  . Hepatitis C    "diagnosed in the past 2 wk" (10/22/2012)  . History of ETOH abuse    quit 2007  . History of radiation therapy 12/17/17- 01/24/18   Larynx treated to 63 gy in 28 fx of 2.25 Gy  . Hyperlipidemia   . Hypertension    "pt. denies - no longer taking blood pressure medications"  . Ischemic cardiomyopathy    a. 03/2018 LV gram: EF 40-45% @ time of MI; b. 08/2019 Echo: EF 55-60%, no rwma, impaired relaxation. Nl RV fxn.  . Myocardial infarction ([place])    x 4  . PAD (peripheral artery disease) ([place])    a. 2010 PTA & stent to left CIA & left SFA; b. 05/2012 PTA & stent to distal LCIA; c. 10/2012 thrombectomy and stents to [place]; d. 11/2014 s/p Ao-Bifem bypass.  . Schizophrenia ([place])   . Seizures ([place])    "when I get anxious" (Stated last seizure three months ago)  . Tobacco abuse   . Urinary frequency    Past Surgical History:  Procedure  Laterality Date  . ABDOMINAL AORTAGRAM N/A 10/22/2012   Procedure: ABDOMINAL Maxcine Ham;  Surgeon: Wellington Hampshire, MD;  Location: [place] [facility];  Service: Cardiovascular;  Laterality: N/A;  . ABDOMINAL AORTAGRAM N/A 11/11/2014   Procedure: ABDOMINAL Maxcine Ham;  Surgeon: Wellington Hampshire, MD;  Location: [place] [facility];  Service: Cardiovascular;  Laterality: N/A;  . ANTERIOR CERVICAL DECOMP/DISCECTOMY FUSION  01/2011   plate & screw placement   . ANTERIOR CERVICAL DECOMP/DISCECTOMY FUSION N/A 01/26/2017   Procedure: ANTERIOR CERVICAL DECOMPRESSION/DISCECTOMY FUSION CERVICAL FOUR- CERVICAL FIVE;  Surgeon: Ashok Pall, MD;  Location: [place];  Service: Neurosurgery;  Laterality: N/A;  ANTERIOR CERVICAL DECOMPRESSION/DISCECTOMY FUSION CERVICAL FOUR- CERVICAL FIVE  . AORTA - BILATERAL FEMORAL ARTERY BYPASS GRAFT N/A 11/20/2014   Procedure: AORTA BIFEMORAL BYPASS GRAFT;  Surgeon: Mal Misty, MD;  Location: [place];  Service: Vascular;  Laterality: N/A;  . BACK SURGERY    . BRAIN SURGERY  1989   subdural hematoma  . CARDIAC CATHETERIZATION     Stent - 12  . CHOLECYSTECTOMY N/A 06/29/2017   Procedure: LAPAROSCOPIC CHOLECYSTECTOMY WITH INTRAOPERATIVE CHOLANGIOGRAM, POSSIBLE OPEN;  Surgeon: Robert Bellow, MD;  Location: [facility];  Service: General;  Laterality: N/A;  . CORONARY ANGIOPLASTY WITH STENT PLACEMENT  03/2007   to  RCA  . CORONARY/GRAFT ACUTE MI REVASCULARIZATION N/A 03/07/2018   Procedure: Coronary/Graft Acute MI Revascularization;  Surgeon: Isaias Cowman, MD;  Location: [place] [facility];  Service: Cardiovascular;  Laterality: N/A;  . HERNIA REPAIR    . ILIAC ARTERY STENT  10/22/2012   "2" (10/22/2012)  . ILIAC ARTERY STENT  06/2012   left  . INCISIONAL HERNIA REPAIR N/A 11/08/2015   Procedure: INCISIONAL HERNIA REPAIR WITH MYOFASCIAL RELEASE;  Surgeon: Rolm Bookbinder, MD;  Location: [place];  Service: General;  Laterality: N/A;  . INSERTION OF MESH N/A 11/08/2015   Procedure:  INSERTION OF MESH;  Surgeon: Rolm Bookbinder, MD;  Location: [place];  Service: General;  Laterality: N/A;  . LEFT HEART CATH AND CORONARY ANGIOGRAPHY N/A 03/07/2018   Procedure: LEFT HEART CATH AND CORONARY ANGIOGRAPHY;  Surgeon: Isaias Cowman, MD;  Location: [place] [facility];  Service: Cardiovascular;  Laterality: N/A;  . LEFT HEART CATHETERIZATION WITH CORONARY ANGIOGRAM N/A 05/29/2012   Procedure: LEFT HEART CATHETERIZATION WITH CORONARY ANGIOGRAM;  Surgeon: Wellington Hampshire, MD;  Location: [place] CATH [facility];  Service: Cardiovascular;  Laterality: N/A;  . LIVER BIOPSY  10/2012  . LOWER EXTREMITY ANGIOGRAM N/A 05/29/2012   Procedure: LOWER EXTREMITY ANGIOGRAM;  Surgeon: Wellington Hampshire, MD;  Location: [place] C[facility];  Service: Cardiovascular;  Laterality: N/A;  . MICROLARYNGOSCOPY WITH CO2 LASER AND EXCISION OF VOCAL CORD LESION N/A 11/21/2017   Procedure: MICROLARYNGOSCOPY WITH EXCISION OF VOCAL CORD LESION;  Surgeon: Jerrell Belfast, MD;  Location: [place];  Service: ENT;  Laterality: N/A;  . PERCUTANEOUS STENT INTERVENTION Left 05/29/2012   Procedure: PERCUTANEOUS STENT INTERVENTION;  Surgeon: Wellington Hampshire, MD;  Location: [place] CATH [facility];  Service: Cardiovascular;  Laterality: Left;  . POSTERIOR FUSION CERVICAL SPINE  2012  . SUBDURAL HEMATOMA EVACUATION VIA CRANIOTOMY      Allergies  Allergies  Allergen Reactions  . Lisinopril Other (See Comments)    Other reaction(s): Elevated creatine and very elevated potassium  Elevated creatine and very elevated potassium Other reaction(s): Elevated creatine and very elevated potassium Other reaction(s): Elevated creatine and very elevated potassium Elevated creatine and very elevated potassium    History of Present Illness    Patient a 60 y.o. male with history of CAD s/p PCI as below, HFrEF secondary to ICM, PAD status post prior bilateral iliac stenting and more recent aortobifemoral bypass in 11/2014 by Dr. Kellie Simmering followed by [facility], mild  to moderate left-sided renal artery stenosis, hepatitis C, ongoing tobacco and alcohol abuse, bipolar disorder, laryngeal cancer, hypertension, and hyperlipidemia who presents for follow-up of his CAD.   Prior cath in 2008 with successful PCI to the RCA.  Repeat cath in 05/2012 showed an occluded LCx that was previously stented with details unclear, patent RCA stent with mild in-stent restenosis, nonobstructive disease involving the LAD with an EF of 35%.  Repeat cath in 06/2013 showed stable coronary anatomy.  He was admitted to the hospital in 03/2018 with an inferior ST elevation MI and was found to have an occluded RCA stent which was successfully treated with PCI/DES.  His previously documented occluded LCx was again noted.  EF was 40 to 45%.  He has attended rehab multiple times in the past without success.  He also noted chronic substernal chest pain described as an aching sensation which was felt to be atypical.  At last ov 06/2019, he noted chest pain and exertional dyspnea that dates back to the time of his most recent cath in 03/2018.  He was offered stress testing but deferred.  Today, he c/o occasional "sharp" left-sided chest pain that radiates to left shoulder and down left arm that occurs approximately 1 x per week. He does not experience n/v, diaphoresis w/these episodes. Reports pain is usually 3-4/10 but if it is >5 he takes SL NTG. Reports chest pain resolves within 5 min of taking ntg. Otherwise, pain might last for hours. He does not like to take ntg frequently because of headache. He states the chest pain is occurring less frequently than at last [facility] July 2020. Reports chronic shortness of breath that occurs at rest and with exertion. He has not been diagnosed with COPD but continues to smoke and reports that both of his parents had COPD and he is sure that he has it also. He ran out of his cardiac medications several months ago, including valsartan, atorvastatin, and has had some carvedilol  which he has taken daily instead of bid as prescribed. He stopped brilinta on his own 1 year ago but has continued aspirin. He went to ED on 10/13/20 with c/o CP and SOB and ecg, troponins were nl. He left hospital AMA and did not f/u as advised. He would like to get his medications refilled today.   Home Medications    Prior to Admission medications   Medication Sig Start Date End Date Taking? Authorizing Provider  ALPRAZolam Duanne Moron) 1 MG tablet Take 1-2 mg by mouth 3 (three) times daily as needed for anxiety.  10/04/18   [provider]  aspirin EC 81 MG EC tablet Take 1 tablet (81 mg total) by mouth daily. 08/29/19   Clapacs, Madie Reno, MD  atorvastatin (LIPITOR) 40 MG tablet Take 1 tablet (40 mg total) by mouth daily at 6 PM. 08/28/19   Clapacs, Madie Reno, MD  cariprazine 4.5 MG CAPS Take 1 capsule (4.5 mg total) by mouth daily. 08/28/19   Clapacs, Madie Reno, MD  carvedilol (COREG) 3.125 MG tablet Take 1 tablet (3.125 mg total) by mouth 2 (two) times daily with a meal. 08/28/19   Clapacs, Madie Reno, MD  divalproex (DEPAKOTE ER) 500 MG 24 hr tablet Take 2 tablets (1,000 mg total) by mouth at bedtime. Patient taking differently: Take 500 mg by mouth 2 (two) times daily.  08/28/19   Clapacs, Madie Reno, MD  lithium carbonate (ESKALITH) 450 MG CR tablet Take 1 tablet (450 mg total) by mouth every 12 (twelve) hours. 08/28/19   Clapacs, Madie Reno, MD  pantoprazole (PROTONIX) 40 MG tablet Take 1 tablet (40 mg total) by mouth daily at 12 noon. 08/28/19   Clapacs, Madie Reno, MD  thiamine 100 MG tablet Take 1 tablet (100 mg total) by mouth daily. Patient not taking: Reported on 02/21/2020 08/29/19   Clapacs, Madie Reno, MD  valsartan (DIOVAN) 40 MG tablet Take 40 mg by mouth daily. 01/08/20   [provider]   Review of Systems    He c/o occasional "sharp" left-sided chest pain that radiates to left shoulder and down left arm that occurs approximately 1 x per week. He does not experience n/v, diaphoresis with these  episodes. He has chronic dyspnea that occurs at rest and with exertion and 2 pillow orthopnea; denies PND. Reports he gets lightheaded with position changes. He denies palpitations, syncope, edema. Reports anorexia but denies n/v/d. All other systems reviewed and are otherwise negative except as noted above.  Physical Exam    VS:  Today's Vitals   01/21/21 1346  BP: (!) 176/120  Pulse: 99  SpO2: 98%  Weight: 120 lb (54.4 kg)  Height: 5\' 7"  (1.702 m)   BMI: Body mass index is 18.79 kg/m. GEN: Thin, emaciated, well dressed, in no acute distress. HEENT: normal. Neck: Supple, no JVD, carotid bruits, or masses. Cardiac: RRR, no murmurs, rubs, or gallops. No clubbing, cyanosis, edema.  Radials/PT 2+ and equal bilaterally.  Respiratory:  Respirations regular and unlabored, diminished to ausculation bilaterally, no audible wheezing. GI: Soft, nontender, nondistended, BS + x 4. MS: no deformity or atrophy. Skin: warm and dry, no rash. Neuro:  Strength and sensation are intact. Psych: Normal affect.  Accessory Clinical Findings    ECG personally reviewed by me today - RSR, 99, PACs, PVC, left axis deviation, inf infarct- no acute changes compared to previous.  Lab Results  Component Value Date   WBC 5.9 10/13/2020   HGB 16.2 10/13/2020   HCT 47.0 10/13/2020   MCV 97.1 10/13/2020   PLT 148 (L) 10/13/2020   Lab Results  Component Value Date   CREATININE 0.73 10/13/2020   BUN 6 10/13/2020   NA 140 10/13/2020   K 4.3 10/13/2020   CL 103 10/13/2020   CO2 28 10/13/2020   Lab Results  Component Value Date   ALT 25 02/21/2020   AST 32 02/21/2020   GGT 49 10/13/2020   ALKPHOS 61 02/21/2020   BILITOT 0.7 02/21/2020   Lab Results  Component Value Date   CHOL 137 06/04/2019   HDL 59 06/04/2019   LDLCALC 61 06/04/2019   LDLDIRECT 64 06/04/2019   TRIG 83 06/04/2019   CHOLHDL 2.3 06/04/2019     Assessment & Plan    1.  CAD native with angina w/DES x 2 RCA, LCx/Ischemic  Cardiomyopathy: He continues to experience chest discomfort on a weekly basis. He reports that his symptoms have improved since he was last seen by cardiology in July 2020. He does not like to take ntg frequently because of the headache but will take if pain is > 5. We discussed adding isosorbide but he has been out of his medications for several months. He does not wish to pursue further ischemic evaluation at this time. He states if pain gets severe, he will go to the ED. In the meantime, we will refill his valsartan and atorvastatin and increase carvedilol to 6.25 mg bid.   2. HFrEF: He is euvolemic on exam and weight is down to 120 lb. He reports chronic dyspnea that occurs with exertion and at rest. 2 pillow orthopnea is stable and he denies PND. With his smoking hx, it is difficult to assess the amount of DOE that occurs in relation to lung disease verses HF, though he does appear euvolemic on exam. He does not see a pulmonologist. Last echo was Sept 2020. Refill  blocker, resume ARB.  He has not been requiring a diuretic.  3. Essential HTN: BP is very elevated today. He ran out of his valsartan months ago and has taken a lower dose of carvedilol to try to stretch it out. Repeat BP by me is 180/100. HR is 100. He does not monitor BP at home. We will refill valsartan 80 and increase carvedilol to 6.25 mg bid. Will see him back in 2 weeks to access need for further intervention and get bmet at that visit.   4. Hyperlipidemia: He has been out of his atorvastatin for several months. He is willing to restart it. Last LDL was 64 July 2020. Will refill atorvastatin and repeat  lipid panel in a few weeks.   5. Alcohol abuse: He reports that he continues to drink about 12 beers per day. He reports he has tried quitting many times without success. Encouraged complete cessation.   6. Tobacco abuse: He continues to smoke 1 ppd. He states he knows that he should quit but has no desire. He has not tried any aids  to help him quit. I encouraged him to try something because he would not know if he could be successful until he tries. He is not interested in help today.   7. Disposition: Refill medications and follow-up with me in 2 weeks for bmet and re-eval.  Murray Hodgkins, NP 01/21/2021, 5:30 PM

## 2021-02-04 ENCOUNTER — Ambulatory Visit: Payer: Medicaid Other | Admitting: Cardiovascular Disease

## 2021-02-07 ENCOUNTER — Encounter: Payer: Self-pay | Admitting: Cardiovascular Disease

## 2021-03-08 ENCOUNTER — Other Ambulatory Visit: Payer: Self-pay

## 2021-03-08 DIAGNOSIS — Z1329 Encounter for screening for other suspected endocrine disorder: Secondary | ICD-10-CM

## 2021-03-10 ENCOUNTER — Telehealth: Payer: Self-pay | Admitting: Cardiovascular Disease

## 2021-03-10 ENCOUNTER — Telehealth: Payer: Self-pay | Admitting: *Deleted

## 2021-03-10 NOTE — Telephone Encounter (Signed)
Spoke with the patient. Patient sts that he has had a fever, cold like symptoms, and sore throat for almost a week. He has not been tested for COVID. He does not have a BP machine at home and he is unable to provide any VS. He reports having mild dizziness with positions change. Patient sts that this started when he resumed his BP medication. Adv the patient that can be expected. Adv him to change positions slowly. He has had a fever for almost a week and has not been drinking a a whole lot.  Adv the patient that his prolonged fever and cold like symptoms should be addressed by his pcp asap and that he may need a COVID test. Adv him to push fluids and to contact his pcp today to address his fever and cold like symptoms.  Patient sts that he will and voiced appreciation for the call back.

## 2021-03-10 NOTE — Telephone Encounter (Signed)
CALLED PATIENT TO ASK ABOUT RESCHEDULING LAB AND FU FROM 03-11-21, LVM FOR A RETURN CALL

## 2021-03-10 NOTE — Telephone Encounter (Signed)
STAT if patient feels like he/she is going to faint   1) Are you dizzy now? Mild just when getting up ok when at rest   2) Do you feel faint or have you passed out?  Uncomfortable walking around no LOC   3) Do you have any other symptoms? No but presently has a cold and fever   4) Have you checked your HR and BP (record if available)?  No but it was done at ov .  Does not have a way to check at home.     Patient is not sure if last visit med change is cause .

## 2021-03-11 ENCOUNTER — Ambulatory Visit: Payer: Medicaid Other

## 2021-03-11 ENCOUNTER — Ambulatory Visit: Payer: Self-pay | Admitting: Radiation Oncology

## 2021-03-23 ENCOUNTER — Ambulatory Visit: Payer: Medicaid Other | Admitting: Nurse Practitioner

## 2021-04-04 ENCOUNTER — Telehealth: Payer: Self-pay | Admitting: Cardiovascular Disease

## 2021-04-04 NOTE — Telephone Encounter (Signed)
Patient declined appt with APP. Put on the waitlist requesting with [facility]. Patient hung up.

## 2021-04-07 ENCOUNTER — Ambulatory Visit: Payer: Medicaid Other | Admitting: Nurse Practitioner

## 2021-04-07 ENCOUNTER — Ambulatory Visit: Payer: Medicaid Other | Admitting: Cardiovascular Disease

## 2021-04-08 ENCOUNTER — Encounter: Payer: Self-pay | Admitting: Cardiovascular Disease

## 2021-12-28 ENCOUNTER — Other Ambulatory Visit: Payer: Self-pay | Admitting: Gastroenterology

## 2021-12-28 DIAGNOSIS — K219 Gastro-esophageal reflux disease without esophagitis: Secondary | ICD-10-CM

## 2021-12-28 DIAGNOSIS — Z8619 Personal history of other infectious and parasitic diseases: Secondary | ICD-10-CM

## 2022-01-04 ENCOUNTER — Ambulatory Visit
Admission: RE | Admit: 2022-01-04 | Discharge: 2022-01-04 | Disposition: A | Discharge status: Home / Self Care | Payer: Medicaid Other | Source: Ambulatory Visit | Attending: Gastroenterology | Admitting: Gastroenterology

## 2022-01-04 ENCOUNTER — Other Ambulatory Visit: Payer: Self-pay

## 2022-01-04 DIAGNOSIS — Z8619 Personal history of other infectious and parasitic diseases: Secondary | ICD-10-CM | POA: Insufficient documentation

## 2022-01-04 DIAGNOSIS — K219 Gastro-esophageal reflux disease without esophagitis: Secondary | ICD-10-CM | POA: Insufficient documentation

## 2022-03-13 ENCOUNTER — Ambulatory Visit: Admission: RE | Admit: 2022-03-13 | Payer: Medicaid Other | Source: Home / Self Care

## 2022-03-13 ENCOUNTER — Encounter: Admission: RE | Payer: Self-pay | Source: Home / Self Care

## 2022-03-13 SURGERY — COLONOSCOPY WITH PROPOFOL
Anesthesia: General

## 2022-04-04 ENCOUNTER — Other Ambulatory Visit: Payer: Self-pay | Admitting: Family Medicine

## 2022-04-04 DIAGNOSIS — J384 Edema of larynx: Secondary | ICD-10-CM

## 2022-04-04 DIAGNOSIS — Z8521 Personal history of malignant neoplasm of larynx: Secondary | ICD-10-CM

## 2022-04-04 DIAGNOSIS — R1310 Dysphagia, unspecified: Secondary | ICD-10-CM

## 2022-04-12 ENCOUNTER — Ambulatory Visit
Admission: RE | Admit: 2022-04-12 | Discharge: 2022-04-12 | Disposition: A | Discharge status: Home / Self Care | Payer: Medicaid Other | Source: Ambulatory Visit | Attending: Family Medicine | Admitting: Family Medicine

## 2022-04-12 DIAGNOSIS — R1310 Dysphagia, unspecified: Secondary | ICD-10-CM | POA: Diagnosis present

## 2022-04-12 DIAGNOSIS — Z8521 Personal history of malignant neoplasm of larynx: Secondary | ICD-10-CM | POA: Diagnosis present

## 2022-04-12 DIAGNOSIS — J384 Edema of larynx: Secondary | ICD-10-CM | POA: Diagnosis present

## 2022-04-12 LAB — POCT I-STAT CREATININE: Creatinine, Ser: 1.1 mg/dL (ref 0.61–1.24)

## 2022-04-12 MED ORDER — IOHEXOL 300 MG/ML  SOLN
100.0000 mL | Freq: Once | INTRAMUSCULAR | Status: AC | PRN
  Administered 2022-04-12: 100 mL via INTRAVENOUS

## 2022-05-22 ENCOUNTER — Other Ambulatory Visit: Payer: Self-pay | Admitting: Otolaryngology

## 2022-05-22 DIAGNOSIS — R1310 Dysphagia, unspecified: Secondary | ICD-10-CM

## 2022-05-25 ENCOUNTER — Ambulatory Visit
Admission: RE | Admit: 2022-05-25 | Discharge: 2022-05-25 | Disposition: A | Discharge status: Home / Self Care | Payer: Medicaid Other | Source: Ambulatory Visit | Attending: Otolaryngology | Admitting: Otolaryngology

## 2022-05-25 ENCOUNTER — Other Ambulatory Visit: Payer: Self-pay | Admitting: Otolaryngology

## 2022-05-25 DIAGNOSIS — R1310 Dysphagia, unspecified: Secondary | ICD-10-CM

## 2022-08-09 ENCOUNTER — Other Ambulatory Visit: Payer: Self-pay

## 2022-08-09 ENCOUNTER — Emergency Department
Admission: EM | Admit: 2022-08-09 | Discharge: 2022-08-10 | Disposition: A | Discharge status: Other Acute Inpatient Hospital | Payer: Medicaid Other | Attending: Emergency Medicine | Admitting: Emergency Medicine

## 2022-08-09 DIAGNOSIS — K9423 Gastrostomy malfunction: Secondary | ICD-10-CM | POA: Diagnosis present

## 2022-08-09 DIAGNOSIS — I10 Essential (primary) hypertension: Secondary | ICD-10-CM | POA: Diagnosis not present

## 2022-08-09 DIAGNOSIS — I2510 Atherosclerotic heart disease of native coronary artery without angina pectoris: Secondary | ICD-10-CM | POA: Diagnosis not present

## 2022-08-09 NOTE — ED Provider Triage Note (Signed)
Emergency Medicine Provider Triage Evaluation Note  [name] , a 61 y.o. male  was evaluated in triage.  Pt complains of g-tube problem. Had it placed at [facility] last week and now it is backing up. It will not flush. No pain. Had a feeding through the tube at 3pm which was also backing up. Had meds in the tube this morning which worked fine. Reports that the fluid will go in, but as soon as he takes the syringe out then the liquid comes right back out.  Review of Systems  Positive: G-tube backing up Negative: pain  Physical Exam  BP (!) 145/87 (BP Location: Left Arm)   Pulse 86   Temp 97.9 F (36.6 C) (Oral)   Resp 16   SpO2 97%  Gen:   Awake, no distress   Resp:  Normal effort  MSK:   Moves extremities without difficulty  Other:    Medical Decision Making  Medically screening exam initiated at 10:26 PM.  Appropriate orders placed.  [name] was informed that the remainder of the evaluation will be completed by another provider, this initial triage assessment does not replace that evaluation, and the importance of remaining in the ED until their evaluation is complete.     Marquette Old, PA-C 08/09/22 2230

## 2022-08-09 NOTE — ED Provider Notes (Signed)
[facility] Provider Note    Event Date/Time   First MD Initiated Contact with Patient 08/09/22 2238     (approximate)   History   Chief Complaint Feeding Tube Problem   HPI  [name] is a 61 y.o. male with past medical history of hypertension, hyperlipidemia, CAD, alcohol abuse, hepatitis C, laryngeal cancer status post PEG tube, and schizophrenia who presents to the ED complaining of feeding tube problem.  Patient reports that his feeding tube was initially placed on August 24, has been functioning normally up until today.  He had attempted to perform a tube feed and states the material went into the PEG tube but then seem to shoot back out.  He states that things do not seem to stay down through the tube, "like I have reflux."  He denies any associated abdominal pain or distention, has not had any nausea or vomiting.  He does state he has noticed bilious fluid coming up through the PEG tube.  His bowel movements have been normal.     Physical Exam   Triage Vital Signs: ED Triage Vitals  Enc Vitals Group     BP 08/09/22 2221 (!) 145/87     Pulse Rate 08/09/22 2221 86     Resp 08/09/22 2221 16     Temp 08/09/22 2221 97.9 F (36.6 C)     Temp Source 08/09/22 2221 Oral     SpO2 08/09/22 2221 97 %     Weight 08/09/22 2231 135 lb (61.2 kg)     Height 08/09/22 2231 '5\' 7"'$  (1.702 m)     Head Circumference --      Peak Flow --      Pain Score 08/09/22 2231 0     Pain Loc --      Pain Edu? --      Excl. in [place]? --     Most recent vital signs: Vitals:   08/09/22 2221  BP: (!) 145/87  Pulse: 86  Resp: 16  Temp: 97.9 F (36.6 C)  SpO2: 97%    Constitutional: Alert and oriented. Eyes: Conjunctivae are normal. Head: Atraumatic. Nose: No congestion/rhinnorhea. Mouth/Throat: Mucous membranes are moist.  Cardiovascular: Normal rate, regular rhythm. Grossly normal heart sounds.  2+ radial pulses bilaterally. Respiratory: Normal respiratory  effort.  No retractions. Lungs CTAB. Gastrointestinal: Soft and nontender. No distention.  PEG tube site clean and  intact with no erythema or warmth. Musculoskeletal: No lower extremity tenderness nor edema.  Neurologic:  Normal speech and language. No gross focal neurologic deficits are appreciated.    ED Results / Procedures / Treatments   Labs (all labs ordered are listed, but only abnormal results are displayed) Labs Reviewed  CBC WITH DIFFERENTIAL/PLATELET  COMPREHENSIVE METABOLIC PANEL  LIPASE, BLOOD   PROCEDURES:  Critical Care performed: No  Procedures   MEDICATIONS ORDERED IN ED: Medications - No data to display   IMPRESSION / MDM / [place] / ED COURSE  I reviewed the triage vital signs and the nursing notes.                              61 y.o. male with past medical history of hypertension, hyperlipidemia, CAD, alcohol abuse, hepatitis C, laryngeal cancer status post PEG tube, and schizophrenia who presents to the ED for return of material through his PEG tube since this afternoon.  Patient's presentation is most consistent with acute presentation  with potential threat to life or bodily function.  Differential diagnosis includes, but is not limited to, bowel obstruction, malfunctioning PEG tube, malpositioned PEG tube, electrolyte abnormality, AKI.  Patient nontoxic-appearing and in no acute distress, vital signs are unremarkable and he has a benign abdominal exam.  PEG tube appears appropriately positioned on exam with no signs of infection.  I introduced about 20 cc of sterile water via the tube, which passed easily initially but then shot backwards through the tube with a mixture of bilious fluid.  We will need to further assess with CT scan to ensure appropriately positioned tube as well as to rule out other obstruction.  Labs are also pending at this time.  Patient turned over to oncoming provider pending labs and imaging results.      FINAL  CLINICAL IMPRESSION(S) / ED DIAGNOSES   Final diagnoses:  PEG tube malfunction ([place])     Rx / DC Orders   ED Discharge Orders     None        Note:  This document was prepared using Dragon voice recognition software and may include unintentional dictation errors.   Blake Divine, MD 08/10/22 0010

## 2022-08-09 NOTE — ED Triage Notes (Signed)
Pt presents to ER with c/o g-tube problem.  Pt states he had G-tube placed appx a week ago at [facility].  Pt states this evening, noticed that the G tube felt backed up.  Pt states he tried to flush the tube, but after taking syringe out, the contents he was trying to flush started to come out.  Pt states he has felt some acid reflux since issue started.  Pt is A&O x4 at this time in NAD in triage.

## 2022-08-10 ENCOUNTER — Encounter: Payer: Self-pay | Admitting: Radiology

## 2022-08-10 ENCOUNTER — Emergency Department: Payer: Medicaid Other

## 2022-08-10 LAB — CBC WITH DIFFERENTIAL/PLATELET
Abs Immature Granulocytes: 0.09 10*3/uL — ABNORMAL HIGH (ref 0.00–0.07)
Basophils Absolute: 0.1 10*3/uL (ref 0.0–0.1)
Basophils Relative: 1 %
Eosinophils Absolute: 0.2 10*3/uL (ref 0.0–0.5)
Eosinophils Relative: 2 %
HCT: 32.2 % — ABNORMAL LOW (ref 39.0–52.0)
Hemoglobin: 10.1 g/dL — ABNORMAL LOW (ref 13.0–17.0)
Immature Granulocytes: 1 %
Lymphocytes Relative: 11 %
Lymphs Abs: 0.9 10*3/uL (ref 0.7–4.0)
MCH: 30.8 pg (ref 26.0–34.0)
MCHC: 31.4 g/dL (ref 30.0–36.0)
MCV: 98.2 fL (ref 80.0–100.0)
Monocytes Absolute: 0.7 10*3/uL (ref 0.1–1.0)
Monocytes Relative: 8 %
Neutro Abs: 6.8 10*3/uL (ref 1.7–7.7)
Neutrophils Relative %: 77 %
Platelets: 350 10*3/uL (ref 150–400)
RBC: 3.28 MIL/uL — ABNORMAL LOW (ref 4.22–5.81)
RDW: 12.3 % (ref 11.5–15.5)
WBC: 8.8 10*3/uL (ref 4.0–10.5)
nRBC: 0 % (ref 0.0–0.2)

## 2022-08-10 LAB — COMPREHENSIVE METABOLIC PANEL
ALT: 45 U/L — ABNORMAL HIGH (ref 0–44)
AST: 23 U/L (ref 15–41)
Albumin: 3.1 g/dL — ABNORMAL LOW (ref 3.5–5.0)
Alkaline Phosphatase: 48 U/L (ref 38–126)
Anion gap: 7 (ref 5–15)
BUN: 14 mg/dL (ref 8–23)
CO2: 28 mmol/L (ref 22–32)
Calcium: 9 mg/dL (ref 8.9–10.3)
Chloride: 103 mmol/L (ref 98–111)
Creatinine, Ser: 0.81 mg/dL (ref 0.61–1.24)
GFR, Estimated: >60 mL/min (ref >60)
Glucose, Bld: 93 mg/dL (ref 70–99)
Potassium: 4.3 mmol/L (ref 3.5–5.1)
Sodium: 138 mmol/L (ref 135–145)
Total Bilirubin: 0.8 mg/dL (ref 0.3–1.2)
Total Protein: 6.8 g/dL (ref 6.5–8.1)

## 2022-08-10 LAB — LIPASE, BLOOD: Lipase: 91 U/L — ABNORMAL HIGH (ref 11–51)

## 2022-08-10 MED ORDER — IOHEXOL 300 MG/ML  SOLN
100.0000 mL | Freq: Once | INTRAMUSCULAR | Status: AC | PRN
Start: 2022-08-10 — End: 2022-08-10
  Administered 2022-08-10: 100 mL via INTRAVENOUS

## 2022-08-10 NOTE — ED Notes (Signed)
Report given to [place], [place] at [phone]

## 2022-08-10 NOTE — ED Provider Notes (Signed)
Patient received in signout from Dr. Charna Archer pending lab work and CT imaging for evaluation of poorly functional PEG tube that was placed 2 weeks ago.  His serum work-up was unremarkable but CT imaging questions leaking tube due to fluid collection outside the stomach.  I consult with MIS surgeon Dr. Garner Nash who actually originally placed the tube and he is agreeable to accept the patient for transfer for surgical evaluation.   Vladimir Crofts, MD 08/10/22 [phone]

## 2022-08-10 NOTE — ED Notes (Signed)
Pt request to wait in lobby for friend to pick him up for transport POV to [facility]. MD D. Tamala Julian made aware and agreeable. Pt given transport packet including disc and directions to present to admissions at [facility]. Pt verbalizes understanding.

## 2022-08-10 NOTE — ED Notes (Signed)
Per crew chief [facility] can not transport patient due to it not being a place they normally transport to

## 2022-08-10 NOTE — ED Notes (Signed)
Iv removed, cath intact, pressure dressing applied, No bleeding noted at site. Pt ambulatory out of dept with steady gait to lobby. Instructed to keep transport packet sealed, and to present straight to [facility] without diverting. Pt verbalizes understanding

## 2022-08-10 NOTE — ED Notes (Signed)
Attempted to call report to [facility] 5c, bed 5305, RN unable to take report at this time, advised by unit secretary of receiving unit that receiving nurse would call for report in apx 15 mins

## 2022-08-24 ENCOUNTER — Other Ambulatory Visit: Payer: Self-pay | Admitting: Family Medicine

## 2022-08-24 DIAGNOSIS — T07XXXA Unspecified multiple injuries, initial encounter: Secondary | ICD-10-CM

## 2023-02-12 ENCOUNTER — Emergency Department
Admission: EM | Admit: 2023-02-12 | Discharge: 2023-02-12 | Disposition: A | Discharge status: Home / Self Care | Payer: Medicaid Other | Attending: Emergency Medicine | Admitting: Emergency Medicine

## 2023-02-12 ENCOUNTER — Emergency Department: Payer: Medicaid Other

## 2023-02-12 DIAGNOSIS — K9423 Gastrostomy malfunction: Secondary | ICD-10-CM

## 2023-02-12 DIAGNOSIS — Z431 Encounter for attention to gastrostomy: Secondary | ICD-10-CM

## 2023-02-12 NOTE — ED Provider Notes (Signed)
[facility] Provider Note  Patient Contact: 5:49 PM (approximate)   History   No chief complaint on file.   HPI  [name] is a 62 y.o. male who presents the emergency department complaining of his G-tube falling out.  Patient states that he has had a G-tube in place for the last 7 months.  He is scheduled in less than 2 weeks to go have a swallow test to see if he can have the G-tube permanently removed but currently he relies on his G-tube for all feedings and medications.  Patient states that he was preparing a feeding tube accidentally was pulled out.  No pain, bleeding, drainage from the site.  No other complaints at this time.     Physical Exam   Triage Vital Signs: ED Triage Vitals  Enc Vitals Group     BP 02/12/23 1531 (!) 164/80     Pulse Rate 02/12/23 1529 87     Resp 02/12/23 1529 18     Temp 02/12/23 1529 98.4 F (36.9 C)     Temp Source 02/12/23 1529 Oral     SpO2 02/12/23 1529 97 %     Weight --      Height --      Head Circumference --      Peak Flow --      Pain Score 02/12/23 1531 2     Pain Loc --      Pain Edu? --      Excl. in [place]? --     Most recent vital signs: Vitals:   02/12/23 1531 02/12/23 2002  BP: (!) 164/80 (!) 146/79  Pulse:  69  Resp:  18  Temp:  98.4 F (36.9 C)  SpO2:  97%     General: Alert and in no acute distress.   Cardiovascular:  Good peripheral perfusion Respiratory: Normal respiratory effort without tachypnea or retractions. Lungs CTAB.  Gastrointestinal: Visualization of the external abdominal wall reveals G-tube site.  G-tube has been obviously removed.  No signs of trauma.  No signs of infection.  Bowel sounds 4 quadrants. Soft and nontender to palpation. No guarding or rigidity. No palpable masses. No distention. No CVA tenderness. Musculoskeletal: Full range of motion to all extremities.  Neurologic:  No gross focal neurologic deficits are appreciated.  Skin:   No rash  noted Other:   ED Results / Procedures / Treatments   Labs (all labs ordered are listed, but only abnormal results are displayed) Labs Reviewed - No data to display   EKG     RADIOLOGY  I personally viewed, evaluated, and interpreted these images as part of my medical decision making, as well as reviewing the written report by the radiologist.  ED Provider Interpretation: Confirmation of placement with intragastric lumen contrast with no evidence of extravasation.  DG ABDOMEN PEG TUBE LOCATION  Result Date: 02/12/2023 CLINICAL DATA:  Peg tube placement EXAM: ABDOMEN - 1 VIEW COMPARISON:  08/10/2022 FINDINGS: Supine frontal view of the abdomen and pelvis demonstrates percutaneous gastrostomy tube overlying the gastric body. Contrast is seen outlining the gastric rugal folds and proximal small bowel. No evidence of contrast extravasation. No bowel obstruction or ileus. Bilateral iliac artery stents are noted. No acute bony abnormalities. IMPRESSION: 1. Percutaneous gastrostomy tube within the gastric lumen. No evidence of contrast extravasation. 2. Unremarkable bowel gas pattern. Electronically Signed   By: Randa Ngo M.D.   On: 02/12/2023 19:03    PROCEDURES:  Critical Care  performed: No  Gastrostomy tube replacement  Date/Time: 02/12/2023 5:50 PM  Performed by: Darletta Moll, PA-C Authorized by: Darletta Moll, PA-C  Consent: Verbal consent obtained. Risks and benefits: risks, benefits and alternatives were discussed Consent given by: patient Patient understanding: patient states understanding of the procedure being performed Required items: required blood products, implants, devices, and special equipment available Patient identity confirmed: verbally with patient and arm band Time out: Immediately prior to procedure a "time out" was called to verify the correct patient, procedure, equipment, support staff and site/side marked as required. Local anesthesia  used: no  Anesthesia: Local anesthesia used: no  Sedation: Patient sedated: no  Patient tolerance: patient tolerated the procedure well with no immediate complications Comments: Patient unsure of previous G-tube size.  Evaluation appears that there has been some healing that has been undergoing in the last several hours as tube has been removed.  Given this fact, 11 [place] G-tube was lubricated, placed with steady pressure through previous site.  Patient had relative ease of reinsertion.  Wound is inflated to manufacture guidelines, gentle traction is placed with external abdominal wall guide slid down to the abdominal wall.  Patient has a small amount of saline flushes through G-tube with no pain and no visible edema about the site.  Will order confirmation with x-ray to ensure proper tube placement.      MEDICATIONS ORDERED IN ED: Medications - No data to display   IMPRESSION / MDM / [place] / ED COURSE  I reviewed the triage vital signs and the nursing notes.                                 Differential diagnosis includes, but is not limited to, G-tube malfunction, G-tube displacement, need for G-tube replacement  Patient's presentation is most consistent with acute presentation with potential threat to life or bodily function.   Patient's diagnosis is consistent with gastrostomy tube displacement and gastrostomy tube replacement.  Patient presents the emergency department after his G-tube was accidentally removed at home.  Patient arrived within several hours of this occurring and we were able to successfully replace at bedside.  Patient had confirmation of placement with x-ray with intragastric lumen contrast with no evidence of extravasation.  Patient also had bowel sounds and easy flushing of G-tube at bedside.  Patient is discharged with ongoing care instructions.  He is to follow-up in 11 days to see if he actually needs the G-tube to remain.  Otherwise follow-up  primary care as needed..  Patient is given ED precautions to return to the ED for any worsening or new symptoms.     FINAL CLINICAL IMPRESSION(S) / ED DIAGNOSES   Final diagnoses:  PEG tube malfunction (HCC)  PEG (percutaneous endoscopic gastrostomy) adjustment/replacement/removal ([place])     Rx / DC Orders   ED Discharge Orders     None        Note:  This document was prepared using Dragon voice recognition software and may include unintentional dictation errors.   Brynda Peon 02/12/23 2008    Naaman Plummer, MD 02/13/23 0003

## 2023-02-12 NOTE — ED Triage Notes (Signed)
Pt presents too the ED due to feeding tube removal. Pt states his feeding tube came out and needs to be replaced. Pt A&Ox4. Pt NAD

## 2023-02-12 NOTE — ED Notes (Signed)
See triage note. Pt states he had his feeding tube come out and needs to be replaced.

## 2023-02-12 NOTE — ED Triage Notes (Signed)
First Nurse Note:  Feeding tube fell out.  Needs feeding tube replaced.

## 2023-09-23 ENCOUNTER — Emergency Department
Admission: EM | Admit: 2023-09-23 | Discharge: 2023-09-23 | Disposition: A | Discharge status: Home / Self Care | Payer: MEDICAID | Attending: Emergency Medicine | Admitting: Emergency Medicine

## 2023-09-23 ENCOUNTER — Emergency Department: Admission: EM | Admit: 2023-09-23 | Discharge: 2023-09-23 | Discharge status: Against Medical Advice | Payer: MEDICAID

## 2023-09-23 ENCOUNTER — Encounter: Payer: Self-pay | Admitting: Emergency Medicine

## 2023-09-23 ENCOUNTER — Emergency Department: Payer: MEDICAID

## 2023-09-23 ENCOUNTER — Other Ambulatory Visit: Payer: Self-pay

## 2023-09-23 DIAGNOSIS — I2510 Atherosclerotic heart disease of native coronary artery without angina pectoris: Secondary | ICD-10-CM | POA: Diagnosis not present

## 2023-09-23 DIAGNOSIS — Z8521 Personal history of malignant neoplasm of larynx: Secondary | ICD-10-CM | POA: Insufficient documentation

## 2023-09-23 DIAGNOSIS — I10 Essential (primary) hypertension: Secondary | ICD-10-CM | POA: Diagnosis not present

## 2023-09-23 DIAGNOSIS — T85598A Other mechanical complication of other gastrointestinal prosthetic devices, implants and grafts, initial encounter: Secondary | ICD-10-CM | POA: Insufficient documentation

## 2023-09-23 DIAGNOSIS — K9423 Gastrostomy malfunction: Secondary | ICD-10-CM

## 2023-09-23 DIAGNOSIS — Y732 Prosthetic and other implants, materials and accessory gastroenterology and urology devices associated with adverse incidents: Secondary | ICD-10-CM | POA: Insufficient documentation

## 2023-09-23 MED ORDER — ONDANSETRON HCL 4 MG/2ML IJ SOLN
4.0000 mg | Freq: Once | INTRAMUSCULAR | Status: AC
Start: 2023-09-23 — End: 2023-09-23
  Administered 2023-09-23: 4 mg via INTRAVENOUS
  Filled 2023-09-23: qty 2

## 2023-09-23 MED ORDER — MORPHINE SULFATE (PF) 4 MG/ML IV SOLN
4.0000 mg | Freq: Once | INTRAVENOUS | Status: AC
  Administered 2023-09-23: 4 mg via INTRAVENOUS
  Filled 2023-09-23: qty 1

## 2023-09-23 MED ORDER — LIDOCAINE HCL URETHRAL/MUCOSAL 2 % EX GEL
1.0000 | Freq: Once | CUTANEOUS | Status: AC
  Administered 2023-09-23: 1 via TOPICAL
  Filled 2023-09-23: qty 6

## 2023-09-23 NOTE — ED Triage Notes (Signed)
Pt called from [facility] to treatment room, no response. Registration unable to locate patient either

## 2023-09-23 NOTE — ED Triage Notes (Signed)
Pt called from [facility] to treatment room, no response 

## 2023-09-23 NOTE — ED Triage Notes (Addendum)
Patient to ED via EMS for feeding tube problem. Pt states he was taking the bandage off to change it- tube came out approx 3 hrs ago. Pt reports bleeding to site. Bandage on site. Has new feeding tube with him.

## 2023-09-23 NOTE — ED Triage Notes (Signed)
Pt comes via EMs from home with c/o feeding tube coming out. Pt states some pain where it came out.  VSS

## 2023-09-23 NOTE — ED Triage Notes (Signed)
Pt called x's 3, no response ?

## 2023-09-23 NOTE — ED Provider Triage Note (Signed)
Emergency Medicine Provider Triage Evaluation Note  [name] , a 62 y.o. male  was evaluated in triage.  Pt complains of his feeding tube coming out about 3 hours ago. Patient reports it was a 22 french feeding tube. He brought a replacement tube with him.  Review of Systems  Positive: Burning pain at the site, bleeding at the site Negative:   Physical Exam  Ht 5\' 7"  (1.702 m)   Wt 59 kg   BMI 20.36 kg/m  Gen:   Awake, no distress   Resp:  Normal effort  MSK:   Moves extremities without difficulty  Other:    Medical Decision Making  Medically screening exam initiated at 4:16 PM.  Appropriate orders placed.  [name] was informed that the remainder of the evaluation will be completed by another provider, this initial triage assessment does not replace that evaluation, and the importance of remaining in the ED until their evaluation is complete.     Cameron Ali, PA-C 09/23/23 [phone]

## 2023-09-23 NOTE — ED Provider Notes (Signed)
[facility] Provider Note    Event Date/Time   First MD Initiated Contact with Patient 09/23/23 1820     (approximate)   History   Chief Complaint Feeding Tube Problem   HPI  [name] is a 62 y.o. 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  Patient reports that he was doing yard work yesterday afternoon when he feels like his PEG tube came out slightly.  It remained in place until around noon today, when he states it fully slid out.  Since then he has noticed some bleeding and dark drainage from the PEG tube site, also reports significant burning pain from the site.  He was not having any issues with his PEG tube prior to it becoming displaced.  He states he has to take everything via PEG tube due to tracheoesophageal fistula, which ENT at [facility] has attempted repair of multiple times and been unsuccessful.     Physical Exam   Triage Vital Signs: ED Triage Vitals  Encounter Vitals Group     BP 09/23/23 1616 (!) 131/90     Systolic BP Percentile --      Diastolic BP Percentile --      Pulse Rate 09/23/23 1616 83     Resp 09/23/23 1616 18     Temp 09/23/23 1616 97.9 F (36.6 C)     Temp Source 09/23/23 1616 Oral     SpO2 09/23/23 1616 97 %     Weight 09/23/23 1615 130 lb (59 kg)     Height 09/23/23 1615 5\' 7"  (1.702 m)     Head Circumference --      Peak Flow --      Pain Score 09/23/23 1615 8     Pain Loc --      Pain Education --      Exclude from Growth Chart --     Most recent vital signs: Vitals:   09/23/23 1616 09/23/23 2031  BP: (!) 131/90 131/69  Pulse: 83 60  Resp: 18 17  Temp: 97.9 F (36.6 C) 97.6 F (36.4 C)  SpO2: 97% 100%    Constitutional: Alert and oriented. Eyes: Conjunctivae are normal. Head: Atraumatic. Nose: No congestion/rhinnorhea. Mouth/Throat: Mucous membranes are  moist.  Cardiovascular: Normal rate, regular rhythm. Grossly normal heart sounds.  2+ radial pulses bilaterally. Respiratory: Normal respiratory effort.  No retractions. Lungs CTAB. Gastrointestinal: Soft and tender around PEG tube site with drainage of clear stomach contents. No distention. Musculoskeletal: No lower extremity tenderness nor edema.  Neurologic:  Normal speech and language. No gross focal neurologic deficits are appreciated.    ED Results / Procedures / Treatments   Labs (all labs ordered are listed, but only abnormal results are displayed) Labs Reviewed - No data to display  RADIOLOGY Abdominal a reviewed and interpreted by me with appropriate positioning of PEG tube.  PROCEDURES:  Critical Care performed: No  Gastrostomy tube replacement  Date/Time: 09/23/2023 11:09 PM  Performed by: Chesley Noon, MD Authorized by: Chesley Noon, MD  Consent: Verbal consent obtained. Risks and benefits: risks, benefits and alternatives were discussed Consent given by: patient Patient identity confirmed: verbally with patient and arm band Local anesthesia used: no  Anesthesia: Local anesthesia used: no  Sedation: Patient sedated: no  Patient tolerance: patient tolerated the procedure well with no immediate complications      MEDICATIONS ORDERED  IN ED: Medications  morphine (PF) 4 MG/ML injection 4 mg (4 mg Intravenous Given 09/23/23 1914)  ondansetron (ZOFRAN) injection 4 mg (4 mg Intravenous Given 09/23/23 1914)  lidocaine (XYLOCAINE) 2 % jelly 1 Application (1 Application Topical Given 09/23/23 1914)     IMPRESSION / MDM / ASSESSMENT AND PLAN / ED COURSE  I reviewed the triage vital signs and the nursing notes.                              62 y.o. 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  Patient's presentation is most consistent with acute complicated illness / injury requiring diagnostic workup.  Differential diagnosis includes, but is not limited to, displaced PEG tube, GI bleeding, infected PEG tube site.  Patient nontoxic-appearing and in no acute distress, vital signs are unremarkable.  He complains of significant pain from around his PEG tube site, no significant bleeding or signs of infection noted, he does have leakage of gastric contents, which seems to be irritating the site.  Lidocaine jelly was applied and PEG tube was replaced with 24 French size tube that patient brought with him. We will confirm positioning with imaging, treat pain with IV morphine and reassess.  Imaging shows appropriate positioning of PEG tube, pain much improved on reassessment.  Patient appropriate for discharge home with outpatient follow-up, was counseled to return to the ED for new or worsening symptoms.  Patient agrees with plan.      FINAL CLINICAL IMPRESSION(S) / ED DIAGNOSES   Final diagnoses:  Gastrostomy malfunction (HCC)     Rx / DC Orders   ED Discharge Orders     None        Note:  This document was prepared using Dragon voice recognition software and may include unintentional dictation errors.   Chesley Noon, MD 09/23/23 [phone]

## 2024-08-12 ENCOUNTER — Encounter: Payer: Self-pay | Admitting: Emergency Medicine

## 2024-08-12 ENCOUNTER — Other Ambulatory Visit: Payer: Self-pay

## 2024-08-12 ENCOUNTER — Emergency Department: Payer: MEDICAID

## 2024-08-12 ENCOUNTER — Emergency Department
Admission: EM | Admit: 2024-08-12 | Discharge: 2024-08-12 | Disposition: A | Discharge status: Other Acute Inpatient Hospital | Payer: MEDICAID | Attending: Emergency Medicine | Admitting: Emergency Medicine

## 2024-08-12 DIAGNOSIS — R0602 Shortness of breath: Secondary | ICD-10-CM | POA: Diagnosis present

## 2024-08-12 DIAGNOSIS — I10 Essential (primary) hypertension: Secondary | ICD-10-CM | POA: Diagnosis not present

## 2024-08-12 DIAGNOSIS — M542 Cervicalgia: Secondary | ICD-10-CM | POA: Insufficient documentation

## 2024-08-12 DIAGNOSIS — Z87731 Personal history of (corrected) tracheoesophageal fistula or atresia: Secondary | ICD-10-CM | POA: Insufficient documentation

## 2024-08-12 DIAGNOSIS — I2510 Atherosclerotic heart disease of native coronary artery without angina pectoris: Secondary | ICD-10-CM | POA: Insufficient documentation

## 2024-08-12 DIAGNOSIS — Z743 Need for continuous supervision: Secondary | ICD-10-CM | POA: Diagnosis not present

## 2024-08-12 DIAGNOSIS — R609 Edema, unspecified: Secondary | ICD-10-CM | POA: Diagnosis not present

## 2024-08-12 LAB — CBC WITH DIFFERENTIAL/PLATELET
Abs Immature Granulocytes: 0.09 K/uL — ABNORMAL HIGH (ref 0.00–0.07)
Basophils Absolute: 0 K/uL (ref 0.0–0.1)
Basophils Relative: 0 %
Eosinophils Absolute: 0.1 K/uL (ref 0.0–0.5)
Eosinophils Relative: 1 %
HCT: 34.6 % — ABNORMAL LOW (ref 39.0–52.0)
Hemoglobin: 11.5 g/dL — ABNORMAL LOW (ref 13.0–17.0)
Immature Granulocytes: 1 %
Lymphocytes Relative: 9 %
Lymphs Abs: 0.8 K/uL (ref 0.7–4.0)
MCH: 31.9 pg (ref 26.0–34.0)
MCHC: 33.2 g/dL (ref 30.0–36.0)
MCV: 95.8 fL (ref 80.0–100.0)
Monocytes Absolute: 0.9 K/uL (ref 0.1–1.0)
Monocytes Relative: 9 %
Neutro Abs: 7.6 K/uL (ref 1.7–7.7)
Neutrophils Relative %: 80 %
Platelets: 432 K/uL — ABNORMAL HIGH (ref 150–400)
RBC: 3.61 MIL/uL — ABNORMAL LOW (ref 4.22–5.81)
RDW: 12.5 % (ref 11.5–15.5)
WBC: 9.5 K/uL (ref 4.0–10.5)
nRBC: 0 % (ref 0.0–0.2)

## 2024-08-12 LAB — BASIC METABOLIC PANEL WITH GFR
Anion gap: 14 (ref 5–15)
BUN: 21 mg/dL (ref 8–23)
CO2: 27 mmol/L (ref 22–32)
Calcium: 9.8 mg/dL (ref 8.9–10.3)
Chloride: 98 mmol/L (ref 98–111)
Creatinine, Ser: 0.64 mg/dL (ref 0.61–1.24)
GFR, Estimated: >60 mL/min (ref >60)
Glucose, Bld: 109 mg/dL — ABNORMAL HIGH (ref 70–99)
Potassium: 4.4 mmol/L (ref 3.5–5.1)
Sodium: 139 mmol/L (ref 135–145)

## 2024-08-12 LAB — RESP PANEL BY RT-PCR (RSV, FLU A&B, COVID)  RVPGX2
Influenza A by PCR: NEGATIVE
Influenza B by PCR: NEGATIVE
Resp Syncytial Virus by PCR: NEGATIVE
SARS Coronavirus 2 by RT PCR: NEGATIVE

## 2024-08-12 LAB — TROPONIN I (HIGH SENSITIVITY): Troponin I (High Sensitivity): 14 ng/L (ref <18)

## 2024-08-12 LAB — BRAIN NATRIURETIC PEPTIDE: B Natriuretic Peptide: 130.4 pg/mL — ABNORMAL HIGH (ref 0.0–100.0)

## 2024-08-12 MED ORDER — SODIUM CHLORIDE 0.9 % IV SOLN
1.0000 g | Freq: Once | INTRAVENOUS | Status: AC
  Administered 2024-08-12: 1 g via INTRAVENOUS
  Filled 2024-08-12: qty 10

## 2024-08-12 MED ORDER — IOHEXOL 300 MG/ML  SOLN
75.0000 mL | Freq: Once | INTRAMUSCULAR | Status: AC | PRN
  Administered 2024-08-12: 75 mL via INTRAVENOUS

## 2024-08-12 MED ORDER — MORPHINE SULFATE (PF) 4 MG/ML IV SOLN
4.0000 mg | Freq: Once | INTRAVENOUS | Status: AC
  Administered 2024-08-12: 4 mg via INTRAVENOUS
  Filled 2024-08-12: qty 1

## 2024-08-12 MED ORDER — ONDANSETRON HCL 4 MG/2ML IJ SOLN
4.0000 mg | Freq: Once | INTRAMUSCULAR | Status: AC
  Administered 2024-08-12: 4 mg via INTRAVENOUS
  Filled 2024-08-12: qty 2

## 2024-08-12 MED ORDER — OXYCODONE HCL 5 MG PO TABS
10.0000 mg | ORAL_TABLET | Freq: Once | ORAL | Status: AC
  Administered 2024-08-12: 10 mg via ORAL
  Filled 2024-08-12: qty 2

## 2024-08-12 MED ORDER — OXYCODONE HCL 5 MG PO TABS: 10.0000 mg | ORAL_TABLET | Freq: Once | ORAL | Status: DC

## 2024-08-12 NOTE — ED Provider Notes (Signed)
 Care assumed of patient from outgoing provider.  See their note for initial history, exam and plan.  Clinical Course as of 08/12/24 0755  Tue Aug 12, 2024  0705 Tracheosophageal fistula repair at [facility] 8/26 - presented with sob - inner canula w/ mucus plugging.  CT w/ gas and signs of inflammation?  Does not appear to have a necrotizing soft tissue infection.  Labs reassuring. Given oxycodone .  CXR w/o pneumonia. Trop and covid negative.  []  Pending call back from [facility] ENT.  [SM]  [phone] Discussed with ENT, exam with mild crepitus to anterior neck.  Otherwise well-appearing and tolerating own secretions.  Given a dose of oral oxycodone  through G-tube. Will go ahead and give 1 dose of IV rocephin  to cover for cellulitis.  Plan to transfer ER to ER - accepted by Dr. Luke   [SM]    Clinical Course User Index [SM] Suzanne Kirsch, MD     Suzanne Kirsch, MD 08/12/24 [phone]

## 2024-08-12 NOTE — Progress Notes (Addendum)
 RT called to assess trach pt for SOB. Pt trached on 07/29/24. Jamal ties clean and in place with sutures removed.  Pts inner cannula completely occluded but sats remained in the upper 90's. Pt was sux'd X 3, new #6 Shiley inner cannula placed, placed on TC w/ 28%/6L humidification and drainage pads were placed where there was none prior. Pt has some slight redness on neck at trach base. RT will cont to follow as needed.

## 2024-08-12 NOTE — ED Notes (Signed)
[facility]  CALLED   

## 2024-08-12 NOTE — ED Notes (Signed)
 Pt given urinal to use restroom. Pt stating is having some pain and would like something for that.

## 2024-08-12 NOTE — ED Provider Notes (Addendum)
 Care assumed of patient from outgoing provider.  See their note for initial history, exam and plan.  Clinical Course as of 08/12/24 0755  Tue Aug 12, 2024  0705 Tracheosophageal fistula repair at [facility] 8/26 - presented with sob - inner canula w/ mucus plugging.  CT w/ gas and signs of inflammation?  Does not appear to have a necrotizing soft tissue infection.  Labs reassuring. Given oxycodone .  CXR w/o pneumonia. Trop and covid negative.  []  Pending call back from [facility] ENT.  [SM]  [phone] Discussed with ENT, exam with mild crepitus to anterior neck.  Otherwise well-appearing and tolerating own secretions.  Given a dose of oral oxycodone  through G-tube. Will go ahead and give 1 dose of IV rocephin  to cover for cellulitis.  Plan to transfer ER to ER - accepted by Dr. Luke   [SM]    Clinical Course User Index [SM] Suzanne Kirsch, MD     Suzanne Kirsch, MD 08/12/24 [id number]    Suzanne Kirsch, MD 08/12/24 [phone]

## 2024-08-12 NOTE — ED Notes (Signed)
 Pt going  to  [facility]  ED carelink  will transport pt

## 2024-08-12 NOTE — ED Provider Notes (Signed)
 [facility] Provider Note    Event Date/Time   First MD Initiated Contact with Patient 08/12/24 0354     (approximate)   History   Chief Complaint Shortness of Breath   HPI  [name] is a 63 y.o. male with past medical history of hypertension, hyperlipidemia, CAD, and tracheoesophageal fistula s/p repair who presents to the ED complaining of shortness of breath.  Patient reports 24 hours of increasing difficulty breathing as well as increased output from his tracheostomy.  He was admitted to [facility] on 8/26 for repair of tracheoesophageal fistula, had tracheostomy placed at that time.  He reports thick yellow output from the tracheostomy with subjective fevers and chills.  He denies any pain in his chest, does report some pain and swelling around his upper neck.  He has not noticed any drainage from his surgical sites.     Physical Exam   Triage Vital Signs: ED Triage Vitals  Encounter Vitals Group     BP 08/12/24 0354 (!) 148/78     Girls Systolic BP Percentile --      Girls Diastolic BP Percentile --      Boys Systolic BP Percentile --      Boys Diastolic BP Percentile --      Pulse Rate 08/12/24 0354 72     Resp 08/12/24 0354 20     Temp 08/12/24 0354 97.8 F (36.6 C)     Temp Source 08/12/24 0354 Oral     SpO2 08/12/24 0354 99 %     Weight 08/12/24 0355 130 lb 1.1 oz (59 kg)     Height 08/12/24 0355 5' 7 (1.702 m)     Head Circumference --      Peak Flow --      Pain Score --      Pain Loc --      Pain Education --      Exclude from Growth Chart --     Most recent vital signs: Vitals:   08/12/24 0430 08/12/24 0600  BP: 116/84 127/60  Pulse: (!) 57 (!) 53  Resp: 16 14  Temp:    SpO2: 100% 100%    Constitutional: Alert and oriented. Eyes: Conjunctivae are normal. Head: Atraumatic. Nose: No congestion/rhinnorhea. Mouth/Throat: Mucous membranes are moist.  Neck: Surgical sites clean and intact with mild edema and  tenderness, no erythema or warmth noted.  Tracheostomy with thick yellow output. Cardiovascular: Normal rate, regular rhythm. Grossly normal heart sounds.  2+ radial pulses bilaterally. Respiratory: Normal respiratory effort.  No retractions. Lungs CTAB. Gastrointestinal: Soft and nontender. No distention. Musculoskeletal: No lower extremity tenderness nor edema.  Neurologic:  Normal speech and language. No gross focal neurologic deficits are appreciated.    ED Results / Procedures / Treatments   Labs (all labs ordered are listed, but only abnormal results are displayed) Labs Reviewed  CBC WITH DIFFERENTIAL/PLATELET - Abnormal; Notable for the following components:      Result Value   RBC 3.61 (*)    Hemoglobin 11.5 (*)    HCT 34.6 (*)    Platelets 432 (*)    Abs Immature Granulocytes 0.09 (*)    All other components within normal limits  BRAIN NATRIURETIC PEPTIDE - Abnormal; Notable for the following components:   B Natriuretic Peptide 130.4 (*)    All other components within normal limits  BASIC METABOLIC PANEL WITH GFR - Abnormal; Notable for the following components:   Glucose, Bld 109 (*)  All other components within normal limits  RESP PANEL BY RT-PCR (RSV, FLU A&B, COVID)  RVPGX2  TROPONIN I (HIGH SENSITIVITY)     EKG  ED ECG REPORT I, Carlin Palin, the attending physician, personally viewed and interpreted this ECG.   Date: 08/12/2024  EKG Time: 4:14  Rate: 61  Rhythm: normal sinus rhythm  Axis: LAD  Intervals:none  ST&T Change: None  RADIOLOGY Chest x-ray reviewed and interpreted by me with no infiltrate, edema, or effusion.  PROCEDURES:  Critical Care performed: No  Procedures   MEDICATIONS ORDERED IN ED: Medications  oxyCODONE  (Oxy IR/ROXICODONE ) immediate release tablet 10 mg (has no administration in time range)  morphine  (PF) 4 MG/ML injection 4 mg (4 mg Intravenous Given 08/12/24 0529)  iohexol  (OMNIPAQUE ) 300 MG/ML solution 75 mL (75 mLs  Intravenous Contrast Given 08/12/24 0615)     IMPRESSION / MDM / ASSESSMENT AND PLAN / ED COURSE  I reviewed the triage vital signs and the nursing notes.                              63 y.o. male with past medical history of hypertension, hyperlipidemia, CAD, and tracheoesophageal fistula repair on 8/26 who presents to the ED complaining of 24 hours of thick output from the tracheostomy as well as difficulty breathing and subjective fevers/chills.  Patient's presentation is most consistent with acute presentation with potential threat to life or bodily function.  Differential diagnosis includes, but is not limited to, pneumonia, bronchitis, COVID-19, influenza, ACS, COPD, CHF, surgical site infection, obstructed tracheostomy.  Patient nontoxic-appearing and in no acute distress, vital signs are unremarkable and do not appear concerning for sepsis.  He did have very thick yellow output from his tracheostomy, and her cannula was removed with some improvement in his breathing and we will have RT suction the area.  He reports pain around his anterior neck, but surgical sites appear to be well-healed with no obvious signs of infection.  We will check CT of his neck in addition to chest x-ray, lab results are pending at this time.  Chest x-ray is unremarkable, COVID testing is negative.  Labs without significant anemia, leukocytosis, electrolyte abnormality, or AKI.  Troponin within normal limits and I doubt ACS.  CT imaging of his neck does show edema and some soft tissue gas, cannot exclude cellulitis or gas-forming infection per radiology.  Overall suspicion for infectious process is low at this time but will discuss with ENT at [facility], where he had his TEF repair, to determine if these findings could represent infection versus expected postoperative change.  Patient turned over to oncoming provider pending review by [facility] ENT.    FINAL CLINICAL IMPRESSION(S) / ED DIAGNOSES   Final diagnoses:   Shortness of breath  Neck pain  History of repair of tracheoesophageal fistula     Rx / DC Orders   ED Discharge Orders     None        Note:  This document was prepared using Dragon voice recognition software and may include unintentional dictation errors.   Palin Carlin, MD 08/12/24 [phone]

## 2024-08-12 NOTE — ED Triage Notes (Addendum)
 Patient arrives via [facility] from home for shortness of breath that started tonight. New trach placed on 8/26 - not vent dependent. Green/yellow drainage coming from tracheostomy site on arrival.

## 2024-08-12 NOTE — ED Notes (Signed)
EMTALA reviewed by Charge RN 

## 2024-08-12 NOTE — ED Notes (Signed)
Xray  [facility]  with  [facility] °

## 2024-08-12 NOTE — ED Notes (Signed)
 Pt belongings placed in belongings bag. Includes white shoes, jeans, shirt, wallet, lighter, pills. Left at bedside.

## 2024-09-24 NOTE — Discharge Summary (Signed)
 ------------------------------------------------------------------------------- Attestation signed by [name] Philip Lunger, MD at 09/25/24 1610  I discussed the findings, assessment and plan with the resident and agree with residents findings and plan as documented on the day of discharge.  I reviewed the resident's note and agree with the discharge plans and disposition. I personally spent less than 30 minutes in discharge planning services.   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.0] H/O laryngeal cancer [Z85.21]  Discharge Diagnosis: Problem List[1]  Hospital Procedures: 09/23/2024  Midline - LARYNGOSCOPY DIRECT, WITH OR WITHOUT TRACHEOSCOPY; DIAGNOSTIC, EXCEPT NEWBORN Midline - DILATION OF ESOPHAGUS, BY UNGUIDED SOUND OR BOUGIE, SINGLE OR MULTIPLE PASSES  Procedure:    LARYNGOSCOPY DIRECT, WITH OR WITHOUT TRACHEOSCOPY; DIAGNOSTIC, EXCEPT NEWBORN CPT(R) Code:  68474 - PR LARYNGOSCOPY,DIRECT,DIAGNOSTIC  Procedure:    DILATION OF ESOPHAGUS, BY UNGUIDED SOUND OR BOUGIE, SINGLE OR MULTIPLE PASSES CPT(R) Code:  56549 - PR DILATE ESOPHAGUS    Reason for Admission/HPI: 59M PMHx HTN, CAD sp CABG, alcohol use disorder (c/b prior DT), SDH sp prior craniotomy, 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  Hospital Course:   The patient was taken to the OR on 10/21 for DLB. he tolerated the procedure well and was extubated intraoperatively without difficulty. he was transferred to the floor where he received  routine post-operative care. RRT called overnight for chest pain/bradycardia. Workup unremarkable, symptoms resolved, patient evaluated by cardiology. At the time of discharge, he was tolerating a his home tube feeding regimen, voiding spontaneously, had his pain controlled with pain medications through his G-tube, and was not actively having nausea or vomiting. He was discharged home in stable condition.    Hospital Consults: IP CONSULT TO NUTRITION SERVICES IP CONSULT TO WOUND NURSE IP CONSULT TO CARDIOLOGY  Nutrition Evaluation: Severe Protein-Calorie Malnutrition in the context of chronic illness (09/24/24 1114) Energy Intake: Clinical criterion not met Interpretation of Wt. Loss: > 5% x 1 month Fat Loss: Severe Muscle Loss: Severe Malnutrition Score: 3  Co-morbidities:   Body mass index is 19.74 kg/m.  Severe Protein-Calorie Malnutrition in the context of chronic illness (09/24/24 1114) Energy Intake: Clinical criterion not met Interpretation of Wt. Loss: > 5% x 1 month Fat Loss: Severe Muscle Loss: Severe Malnutrition Score: 3  Wt Readings from Last 12 Encounters:  09/23/24 53.8 kg (118 lb 9.7 oz)  09/03/24 54 kg (119 lb)  08/13/24 57.2 kg (126 lb 1.7 oz)  07/29/24 57.2 kg (126 lb 1.7 oz)  07/03/24 54.4 kg (120 lb)  03/26/24 58.5 kg (129 lb)      Discharge Condition:  Subjective  No acute events overnight. The patient was seen and examined by the Otolaryngology-Head and Neck Surgery team on the day of discharge. Discharge plan was discussed, instructions were given and all questions answered.  Objective:  Temp:  [36.2 C (97.2 F)-36.7 C (98.1 F)] 36.6 C (97.9 F) Pulse:  [41-85] 45 SpO2 Pulse:  [58-79] 58 Resp:  [10-23] 18 BP: (100-192)/(60-165) 121/66 MAP (mmHg):  [74-87] 80 FiO2 (%):  [28 %] 28 % SpO2:  [92 %-100 %] 97 %  Exam:  General: Awake, alert, in no  acute distress. Neuro: Appropriate affect, oriented x4. Head: NCAT; HB 1/6; facial sensation  intact and symmetric bilaterally in all division of CN 5 Eyes: EOMI - sclera and conjunctiva clear, PERRL Ears: External auditory canal clear, no active drainage Nose: No nasal drainage present OC/OP: Intraorally clear, FOM soft and tongue protrudes midline  Neck: Trachea midline, no masses. No crepitus, soft and flat. Expected p/o ROM. 6 CFS Shiley in place, secured with circumferential collar Pulm: Unlabored - no stridor/stertor, stable O2 sats on ATC CV: Extremities warm and well perfused Extremities: Moves all extremities independently Abd: G-tube in place  Condition at Discharge: Improved Discharge Medications:    Your Medication List     ASK your doctor about these medications    amoxicillin-clavulanate 875-125 mg per tablet Commonly known as: AUGMENTIN 1 tablet by G-tube route two (2) times a day for 7 days. Ask about: Should I take this medication?   oxyCODONE  5 MG immediate release tablet Commonly known as: ROXICODONE  Take 1 tablet (5 mg total) by G-tube route every four (4) hours as needed for pain for up to 7 days. Ask about: Should I take this medication?   pantoprazole  40 MG tablet Commonly known as: Protonix  Take 1 tablet (40 mg total) by mouth Two (2) times a day (30 minutes before a meal).        Pending Test Results:  Final surgical pathology  Pathology: Diagnosis  Date Value Ref Range Status  07/29/2024   Final   A: Neck, inferior fistula margin, excision: - Scar tissue - Negative for malignancy  B: Right thyroid  isthmus, resection: - Benign thyroid  tissue  C: Partial right thyroid , lobectomy: - Benign thyroid  tissue  This electronic signature is attestation that the pathologist personally reviewed the submitted material(s) and the final diagnosis reflects that evaluation.      Discharge Instructions:   Other Instructions:   Future Appointments:           [1] Patient Active Problem List Diagnosis   Tracheoesophageal  fistula    (CMS-HCC)   Open wound of neck   H/O laryngeal cancer

## 2024-10-27 NOTE — Progress Notes (Signed)
 Chief Complaint  Patient presents with   Annual Exam    Subjective:   [name] is a 63 y.o. male here for a health maintenance visit.   History of Present Illness [name] is a 63 year old male with a history of laryngectomy and G-tube placement who presents with weight loss and nutritional concerns.  He has experienced ongoing weight loss, with his weight decreasing from the 150s to 119 pounds. He attributes this to insufficient caloric intake, as he is currently consuming five Isosource 1.5 nutritional supplements per day, each providing 375 calories each. The prescription was originally written for six supplements per day, but he has been receiving only five, and he feels like this is not helping with his weight. Additionally, he has had recent surgery last few months and that may have increase his caloric nutritional deficit.  Patient reports to me that previously his oncology team was riding his prescription for supplementation.  He states that this has not been the case recently.  He has not had a referral to nutrition recently.  He has a history of alcohol use disorder but has been sober for 27 months. He acknowledges that alcohol was negatively impacting his health and states, 'Fifty years of alcohol will do something to you.'  He is not currently taking any medications for mental health and attributes his improved condition to sobriety.  Patient does not take any medications for his cardiovascular disease, hypertension, or peripheral vascular disease.  He is aware that he is supposed to be taking a statin, baby aspirin , and blood thinner ticagrelor .  However he states he is unwilling to take any of these medications.  He is currently taking Protonix  twice daily as an acid blocker.   He is physically active, walking regularly, and caring for a friend who has had a stroke and lives with him.  He does not have any teeth and mentions a sensation of something feels like it  is in his right eye , though he has not sought an eye examination. He is not interested in referral to ophthalmology.   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   Pharm med refill report only indicates augmentin, 42 count oxy in fall 2025, septra and stays on protonix . Otherwise no pharmacy data.  This is in agreement with his history stating that he is not taking any medications routinely except those listed.  Reviewed external CBC and chem panel from October. Mild anemia. Chem panel reasonable. [facility] external labs.     Patient Active Problem List  Diagnosis   Hyperlipidemia   Hypertension   Hx of hepatitis C   Bipolar disorder (CMS/HHS-HCC)   Paranoid schizophrenia (CMS-HCC)   PVD: Claudication of left lower extremity   Abnormal CXR   Tobacco use   Reflux   History of heart attack   History of anemia   History of drug use   PVD (peripheral vascular disease)   Lung nodule   History of squamous cell carcinoma   Cardiomyopathy, ischemic   Carotid bruit   Chest pain   Cholecystitis   Coronary atherosclerosis of native coronary artery   Cough, persistent   Dyspnea   Alcoholism in remission (CMS/HHS-HCC)   Gastroesophageal reflux disease without esophagitis   Hoarseness  History of laryngeal cancer   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   Osteoarthritis of spine with radiculopathy, cervical region   Rib pain on right side   Old myocardial infarction   Vocal cord mass   G tube feedings (CMS/HHS-HCC)     Medications he has listed that he is supposed to be taking: Outpatient Medications Prior to Visit  Medication Sig Dispense Refill   lactose-reduced food (ENSURE ENLIVE ORAL) Take by mouth     UNABLE TO  FIND Med Name: [employer] IO Source 1.5 cal nutrition supplement via feeding tube     pantoprazole  (PROTONIX ) 40 MG DR tablet Take 1 tablet (40 mg total) by mouth 2 (two) times daily before meals 60 tablet 11   aspirin  81 MG chewable tablet Take by mouth (Patient not taking: Reported on 10/27/2024)     atorvastatin  (LIPITOR) 40 MG tablet Take 1 tablet (40 mg total) by mouth once daily (Patient not taking: Reported on 10/27/2024) 30 tablet 11   BELSOMRA 20 mg tablet Take 20 mg by mouth at bedtime (Patient not taking: Reported on 10/27/2024)     CAPLYTA 42 mg Cap  (Patient not taking: Reported on 10/27/2024)     cariprazine  (VRAYLAR ) 4.5 mg Cap Take 4.5 mg by mouth once daily (Patient not taking: Reported on 10/27/2024)     carvediloL  (COREG ) 6.25 MG tablet Take 1 tablet (6.25 mg total) by mouth 2 (two) times daily with meals 60 tablet 11   clonazePAM  (KLONOPIN ) 1 MG tablet  (Patient not taking: Reported on 10/27/2024)     cyproheptadine (PERIACTIN) 4 mg tablet Take 4 mg by mouth at bedtime (Patient not taking: Reported on 10/27/2024)     divalproex  (DEPAKOTE  ER) 500 MG ER tablet Take 500 mg by mouth 2 (two) times daily (Patient not taking: Reported on 10/27/2024)     fentaNYL  37.5 mcg/hour PT72  (Patient not taking: Reported on 10/27/2024)  0   folic acid  (FOLVITE ) 1 MG tablet Take 1,000 mcg by mouth once daily (Patient not taking: Reported on 10/27/2024)     gabapentin  (NEURONTIN ) 300 MG capsule  (Patient not taking: Reported on 10/27/2024)  0   lithium  carbonate 450 MG ER tablet Take 1 tablet (450 mg total) by mouth 2 (two) times daily. (Patient not taking: Reported on 10/27/2024) 15 tablet 0   mecobalamin, vitamin B12, (B12 ACTIVE) 1,000 mcg Chew Take 1,000 mcg by mouth once daily (Patient not taking: Reported on 10/27/2024) 90 tablet 3   OLANZapine-FLUoxetine (SYMBYAX) 3-25 mg per capsule  (Patient not taking: Reported on 10/27/2024)     oxyCODONE  (ROXICODONE ) 5 MG immediate release  tablet Take 5 mg by mouth (Patient not taking: Reported on 10/27/2024)     QUEtiapine  (SEROQUEL ) 50 MG tablet  (Patient not taking: Reported on 10/27/2024)     thiamine  (VITAMIN B-1) 100 MG tablet Take by mouth (Patient not taking: Reported on 10/27/2024)     ticagrelor  (BRILINTA ) 90 mg tablet Take 90 mg by mouth 2 (two) times daily (Patient not taking: Reported on 10/27/2024)     valsartan  (DIOVAN ) 40 MG tablet Take 1 tablet (40 mg total) by mouth once daily 30 tablet 11   vitamin B complex-folic acid  0.4 mg Tab Take by mouth (Patient not taking: Reported on 10/27/2024)     VYVANSE  10 mg capsule Take 10 mg by mouth every morning (Patient not taking: Reported on 10/27/2024)     nitroGLYcerin  (NITROSTAT ) 0.4 MG SL tablet Place 1 tablet (0.4  mg total) under the tongue every 5 (five) minutes as needed for Chest pain May take up to 3 doses. (Patient not taking: Reported on 10/27/2024) 25 tablet 0   No facility-administered medications prior to visit.    Social Drivers of Health with Concerns   Tobacco Use: High Risk (10/27/2024)   Patient History    Smoking Tobacco Use: Every Day    Smokeless Tobacco Use: Never  Financial Resource Strain: High Risk (08/15/2024)   Received from [facility]   Overall Financial Resource Strain (CARDIA)    How hard is it for you to pay for the very basics like food, housing, medical care, and heating?: Hard      07/03/2023  [employer] Authorization for Release of Information - [employer]   Are any of your needs urgent? No  Would you like help with any of the needs that you have identified? No *  Permission to provide our community partners with your name, address, and phone number so they can contact you Decline    * Data saved with a previous flowsheet row definition    Social History   Tobacco Use   Smoking status: Every Day    Current packs/day: 0.00    Average packs/day: 1 pack/day for 40.0 years (40.0 ttl pk-yrs)    Types: Cigarettes     Start date: 06/30/1975    Last attempt to quit: 06/30/2015    Years since quitting: 9.3   Smokeless tobacco: Never   Tobacco comments:    handout provided Fall 2013- no plans to quit 03/2013  Substance Use Topics   Alcohol use: No    Comment: prior abuse- last drink 2022   Drug use: No    Comment: cocaine and marijuana positive on tox screen [facility] 01/2013   Other Health Habits: Exercise: moving a lot, no gym etc.  Diet: uses ISOsource 1.5 but only has enuff for 5 per day instead of 6 per day and losing weight. Dental Exam: no teeth Eye Exam: can drive at night.    Health Maintenance: See under health Maintenance activity for review of completion dates as well. Immunization History  Administered Date(s) Administered   Influenza IIV4, IM pres-free 09/11/2014   Influenza QUAD (IM) 10/11/2012   Influenza, IM unspecified 11/09/2015   PNEUMOCOCCAL (PPSV23)(>=37YRS -OR- >=2 YRS WITH RISK) VACCINE (PNEUMOVAX 23) 09/11/2014    Health Maintenance Topics with due status: Overdue     Topic Date Due   Hepatitis A Vaccines Never done   RSV Immunization Pregnant or 50+ Never done   Lung Cancer Screening Never done   Pneumococcal Vaccine: 50+ 09/12/2015   Colorectal Cancer Screening 01/22/2018   Adult Tetanus (Td And Tdap) 12/04/2021   PSA 01/07/2022   Lipid Panel 04/04/2023   Creatinine Level 08/17/2023   Potassium Level 08/17/2023   Serum Calcium  08/17/2023   Health Maintenance Topics with due status: Not Due     Topic Last Completion Date   Depression Screening 10/27/2024   Annual Physical/Well Child Check 10/27/2024   Health Maintenance Topics with due status: Completed     Topic Last Completion Date   Hepatitis C Screen 09/05/2012   HIV Screen 06/13/2016   Health Maintenance Topics with due status: Aged Out     Topic Date Due   Hib Vaccines Aged Out   Meningococcal B Vaccine Aged Out   Meningococcal ACWY Vaccine Aged Out   HPV Vaccines Aged Out   Health  Maintenance Topics with due status: Discontinued  Topic Date Due   COVID-19 Vaccine Discontinued   Shingrix Discontinued   Influenza Vaccine Discontinued    Depression Screen-PHQ2/PHQ9 <redacted file path> PHQ-2 PHQ-2 Over the last 2 weeks, how often have you been bothered by any of the following problems? Little interest or pleasure in doing things: (Patient-Rptd) Several days Feeling down, depressed, or hopeless: (Patient-Rptd) Several days Patient Health Questionnaire-2 Score: (Patient-Rptd) 2  PHQ-9 (if PHQ >=3)    PHQ-2 Interpretation Values between 0-3 are considered not significant for depression  PHQ-9 Interpretation and Treatment Recommendations:  0-4= None  5-9= Mild / Treatment: Support, educate to call if worse; return in one month  10-14= Moderate / Treatment: Support, watchful waiting; Antidepressant or Psychotherapy  15-19= Moderately severe / Treatment: Antidepressant OR Psychotherapy  >= 20 = Major depression, severe / Antidepressant AND Psychotherapy  GAD: GAD7 <redacted file path>    10/27/2024  GAD-7 w/ Score  Feeling nervous, anxious, or on edge Nearly every day  Not being able to stop or control worrying Several days  Worrying too much about different things Several days  Trouble relaxing Several days  Being so restless that it is hard to sit still Several days  Becoming easily annoyed or irritable Nearly every day  Feeling afraid as if something awful might happen Not at all  GAD-7 Total Score 10 *  --     * Patient-reported     GAD-7  Interpretation and Treatment Recommendations:  0-4= None  5-9= Mild / Treatment: Support, educate to call if worse; return as directed  10-14= Moderate / Treatment: Support, watchful waiting; Antianxiety med or Psychotherapy  15-21= Severe anxiety / Treatment; Antianxiety med OR Psychotherapy   Review of Systems  Constitutional: Negative.   HENT: Negative.    Eyes: Negative.   Respiratory:  Positive for  cough.        Clearing secretions  Cardiovascular: Negative.   Gastrointestinal: Negative.   Endocrine: Negative.   Genitourinary: Negative.   Musculoskeletal: Negative.   Skin: Negative.   Allergic/Immunologic: Negative.   Neurological: Negative.   Hematological: Negative.   Psychiatric/Behavioral: Negative.        Objective:   Vitals:   10/27/24 1253  BP: 136/79  Pulse: 69  Weight: 54 kg (119 lb)  Height: 162.6 cm (5' 4)  PainSc:   7  PainLoc: Throat   Body mass index is 20.43 kg/m. Home vitals:     Physical Exam MEASUREMENTS: Weight- 119.  Physical Exam Constitutional: alert, frail, and in NAD Conversation: normal and appropriate HEENT: EOMI, external ear canals normal, tympanic membranes normal, and pupils equal and round Skin: normal Oropharynx: mucous membranes moist Neck: supple and no thyroid  enlargement or cervical adenopathy, tracheostomy tube in place Respiratory: clear to auscultation, without rales or wheezes Cardiovascular: regular rate and rhythm and without murmurs, rubs or gallops Abdomen: soft, no hepatosplenomegaly, and patient has G-tube in place, no signs of infection. Musculoskeletal: age appropriate ROM of shoulders, hips and knees Extremities: no lower extremity edema, Neurological: grossly intact and normal gait   Declines all health maintenance items: colonoscopy, lung scans, vaccines.   Results    Assessment/Plan:   Patient was seen for a health maintenance exam.  Counseled the patient on health maintenance issues. Reviewed his health maintenance schedule and ordered appropriate tests. Counseled on regular exercise and weight management. Recommend regular eye exams and dental cleaning.   The following issues were addressed today for health maintenance: Encouraged to schedule routine dental follow up Follow up for  annual exam in one year Recommended sunscreen usage Recommended wearing seat belts  The patient's BMI is in the  acceptable range  Assessment & Plan Adult Wellness Visit   Routine wellness visit focused on maintaining physical activity and nutrition. Encouraged regular physical activity and nutrition.  Tracheoesophageal fistula, status post laryngectomy with G-tube dependence for nutrition   Post-laryngectomy with G-tube dependence, current nutrition regimen is insufficient, causing weight loss. Increased Isisource 1.5 to six per day to improve caloric intake. Coordinated with [facility] for home delivery of nutrition supplements.  Nutrition referral to assist with caloric maintenance.  Abnormal weight loss due to inadequate nutrition  - new problem. Needs additional attention. Concerning given hx of cancer. Weight loss due to inadequate caloric intake from current nutrition regimen. Increased Isosource 1.5 to six per day to address weight loss.Nutrition referral to assist with caloric maintenance. F/u one month for weight mgt visit.  Gastroesophageal reflux disease without esophagitis   GERD managed with Pantoprazole . Continue Pantoprazole  40 mg oral bid.  Angina pectoris- chronic, intermittent. Per pt not new. Known ASCVD.   Stable angina with no current chest pain. Discussed importance of nitroglycerin  for management. Prescribed nitroglycerin  sublingual tablets. ( My first rx for this patient in this regards)  Patient declines the statins, elastance, ticagrelor , or a referral to cardiology or vascular. Concerning.  Schizophrenia, patient states that he is stable and does not need medications.  He declines all medications and referral to psychiatry.  Alcohol dependence, in remission   Alcohol dependence in remission for 27 months. No withdrawal symptoms. Continue abstinence from alcohol.     Diagnoses and all orders for this visit:  Routine general medical examination at health care facility  Essential hypertension  Hypercholesterolemia -     Lipid Panel W/Reflex Direct Low Density Lipoprotein  (LDL) Cholesterol; Future  Hyperglycemia  Screening PSA (prostate specific antigen) -     Prostate Specific Antigen (PSA), Screen; Future  Depression screening (Z13.31) -     Depression Screen -(PHQ- 2/9, BDI)  Encounter for behavioral health screening (Z13.30) -     Anxiety Screen [NUR6106]  Paranoid schizophrenia (CMS/HHS-HCC)  Hx of hepatitis C  Alcoholism in remission (CMS/HHS-HCC)  History of laryngeal cancer  History of heart attack  PVD (peripheral vascular disease)  Mixed hyperlipidemia  Weight loss, abnormal -     Ambulatory Referral to Nutrition -     ISO SOURCE 1.5  Gastroesophageal reflux disease without esophagitis -     pantoprazole  (PROTONIX ) 40 MG DR tablet; Take 1 tablet (40 mg total) by mouth 2 (two) times daily before meals  Anginal syndrome -     nitroGLYcerin  (NITROSTAT ) 0.4 MG SL tablet; Place 1 tablet (0.4 mg total) under the tongue every 5 (five) minutes as needed for Chest pain May take up to 3 doses.  Other orders -     External Pathology Result     This visit was coded based on medical decision making (MDM).           Future Appointments     Date/Time Provider Department Center Visit Type   11/26/2024 11:30 AM (Arrive by 11:15 AM) Jyl Heron Haff, MD [facility] [place] [facility] OFFICE VISIT   10/28/2025 11:00 AM (Arrive by 10:45 AM) Jyl Heron Haff, MD [facility] PHYSICAL       An after visit summary was provided for the patient either in written format or through MyChart  This note has been created using  automated tools and reviewed for accuracy by BARBARA D ALDRIDGE.  *Some images could not be shown.

## 2024-12-31 ENCOUNTER — Other Ambulatory Visit: Payer: Self-pay

## 2024-12-31 ENCOUNTER — Emergency Department: Payer: MEDICAID

## 2024-12-31 ENCOUNTER — Emergency Department
Admission: EM | Admit: 2024-12-31 | Discharge: 2024-12-31 | Disposition: A | Discharge status: Home / Self Care | Payer: MEDICAID

## 2024-12-31 DIAGNOSIS — K9423 Gastrostomy malfunction: Secondary | ICD-10-CM | POA: Insufficient documentation

## 2024-12-31 MED ORDER — MORPHINE SULFATE (PF) 4 MG/ML IV SOLN
4.0000 mg | Freq: Once | INTRAVENOUS | Status: AC
  Administered 2024-12-31: 4 mg via INTRAMUSCULAR
  Filled 2024-12-31: qty 1

## 2024-12-31 MED ORDER — DIATRIZOATE MEGLUMINE & SODIUM 66-10 % PO SOLN
30.0000 mL | Freq: Once | ORAL | Status: AC
  Administered 2024-12-31: 30 mL

## 2024-12-31 NOTE — Progress Notes (Signed)
 RT at bedside while pt. Suctions himself.Pt is used to suctionning himself at home. Pt. Tol. Well.

## 2024-12-31 NOTE — ED Provider Notes (Signed)
 "  [facility] Provider Note    Event Date/Time   First MD Initiated Contact with Patient 12/31/24 1305     (approximate)   History   Feeding Tube Concern   HPI  [name] is a 64 y.o. male presenting today with concern of G-tube issue.  He has a 47 French G-tube, yesterday at fell out apparently has not been changed since 2024 at our facility.  States that it has become clogged and he has not been able to use it since yesterday causing him to present this time.  Denies any other complaints.     Physical Exam   Triage Vital Signs: ED Triage Vitals  Encounter Vitals Group     BP 12/31/24 1159 (!) 154/83     Girls Systolic BP Percentile --      Girls Diastolic BP Percentile --      Boys Systolic BP Percentile --      Boys Diastolic BP Percentile --      Pulse Rate 12/31/24 1158 100     Resp 12/31/24 1158 18     Temp 12/31/24 1158 97.9 F (36.6 C)     Temp Source 12/31/24 1158 Oral     SpO2 12/31/24 1158 100 %     Weight --      Height --      Head Circumference --      Peak Flow --      Pain Score 12/31/24 1158 1     Pain Loc --      Pain Education --      Exclude from Growth Chart --     Most recent vital signs: Vitals:   12/31/24 1158 12/31/24 1159  BP:  (!) 154/83  Pulse: 100   Resp: 18   Temp: 97.9 F (36.6 C)   SpO2: 100%      General: Awake, no distress.  CV:  Good peripheral perfusion.  Resp:  Normal effort.  Abd:  No distention.  Soft nontender, 24 French G-tube is present Other:     ED Results / Procedures / Treatments   Labs (all labs ordered are listed, but only abnormal results are displayed) Labs Reviewed - No data to display   EKG     RADIOLOGY Rugae are visualized, gastrostomy tube is in appropriate location  PROCEDURES:  Critical Care performed: No  .Gastrostomy tube replacement  Date/Time: 12/31/2024 3:20 PM  Performed by: Fernand Rossie HERO, MD Authorized by: Fernand Rossie HERO, MD   Consent: Verbal consent obtained Risks and benefits: risks, benefits and alternatives were discussed Consent given by: patient Patient identity confirmed: verbally with patient Time out: Immediately prior to procedure a time out was called to verify the correct patient, procedure, equipment, support staff and site/side marked as required. Preparation: Patient was prepped and draped in the usual sterile fashion. Local anesthesia used: no  Anesthesia: Local anesthesia used: no  Sedation: Patient sedated: no  Patient tolerance: patient tolerated the procedure well with no immediate complications Comments: 47 French gastrostomy tube was placed      MEDICATIONS ORDERED IN ED: Medications  morphine  (PF) 4 MG/ML injection 4 mg (4 mg Intramuscular Given 12/31/24 1403)  diatrizoate  meglumine -sodium (GASTROGRAFIN ) 66-10 % solution 30 mL (30 mLs Per Tube Given 12/31/24 1458)     IMPRESSION / MDM / ASSESSMENT AND PLAN / ED COURSE  I reviewed the triage vital signs and the nursing notes.  Patient's presentation is most consistent with acute complicated illness / injury requiring diagnostic workup.  64 year old male who presents today due to concern of issues with his gastrostomy tube.  I removed the old tube which was not functioning, unfortunately did not have an appropriate 24 French tube, I discussed with the patient, we will change the tube today to a 18 French tube, I advised him to follow-up with the person who placed the tube to have the size increased, awaiting x-ray imaging results and feels reasonable for discharge afterwards..   Clinical Course as of 12/31/24 1521  Wed Dec 31, 2024  1515 Patient feels well he is comfortable with discharge home, on my independent septation of the x-ray, G-tube appears to be in appropriate location.  Will have the patient discharged home at this time [SK]    Clinical Course User Index [SK] Fernand Rossie HERO, MD      FINAL CLINICAL IMPRESSION(S) / ED DIAGNOSES   Final diagnoses:  Malfunction of gastrostomy tube [facility])     Rx / DC Orders   ED Discharge Orders     None        Note:  This document was prepared using Dragon voice recognition software and may include unintentional dictation errors.   Fernand Rossie HERO, MD 12/31/24 1521  "

## 2024-12-31 NOTE — ED Triage Notes (Addendum)
 Pt to ED via [facility] from home. Pt reports feeding tube needs to be replaced. Last replaced 16-9months ago. Pt reports tube came out the other day and out it back in. Pt has trach in place

## 2024-12-31 NOTE — ED Notes (Signed)
 42f Gtube removed by Fernand, MD. 57F g tube placed without incident. Pt tolerated well. Site redressed by this RN

## 2024-12-31 NOTE — Discharge Instructions (Signed)
 You were seen today due to concern of issues with your G-tube, we have replaced it today with an 63 French G-tube which is smaller than your old G-tube. You will need to follow-up with the initial provider of this tube to have this replaced.  If you have any worsening symptoms such as abdominal pain, severe nausea, vomiting, or any other symptoms you find concerning please return to the emergency department for further assessment and evaluation

## 2024-12-31 NOTE — ED Notes (Signed)
 RT at bedside to assist with deep suctioning of trach. Pt tolerated well.

## 2025-01-01 ENCOUNTER — Emergency Department: Payer: MEDICAID

## 2025-01-01 ENCOUNTER — Other Ambulatory Visit: Payer: Self-pay

## 2025-01-01 ENCOUNTER — Observation Stay
Admission: EM | Admit: 2025-01-01 | Discharge: 2025-01-02 | Disposition: A | Discharge status: Home / Self Care | Payer: MEDICAID | Attending: Internal Medicine | Admitting: Internal Medicine

## 2025-01-01 DIAGNOSIS — Z7982 Long term (current) use of aspirin: Secondary | ICD-10-CM | POA: Diagnosis not present

## 2025-01-01 DIAGNOSIS — F3170 Bipolar disorder, currently in remission, most recent episode unspecified: Secondary | ICD-10-CM | POA: Diagnosis present

## 2025-01-01 DIAGNOSIS — E875 Hyperkalemia: Secondary | ICD-10-CM | POA: Insufficient documentation

## 2025-01-01 DIAGNOSIS — F319 Bipolar disorder, unspecified: Secondary | ICD-10-CM | POA: Insufficient documentation

## 2025-01-01 DIAGNOSIS — I2510 Atherosclerotic heart disease of native coronary artery without angina pectoris: Secondary | ICD-10-CM | POA: Diagnosis not present

## 2025-01-01 DIAGNOSIS — E43 Unspecified severe protein-calorie malnutrition: Secondary | ICD-10-CM | POA: Insufficient documentation

## 2025-01-01 DIAGNOSIS — K9423 Gastrostomy malfunction: Principal | ICD-10-CM | POA: Insufficient documentation

## 2025-01-01 DIAGNOSIS — Z87731 Personal history of (corrected) tracheoesophageal fistula or atresia: Secondary | ICD-10-CM | POA: Diagnosis not present

## 2025-01-01 DIAGNOSIS — K8050 Calculus of bile duct without cholangitis or cholecystitis without obstruction: Secondary | ICD-10-CM | POA: Diagnosis not present

## 2025-01-01 DIAGNOSIS — F17210 Nicotine dependence, cigarettes, uncomplicated: Secondary | ICD-10-CM | POA: Insufficient documentation

## 2025-01-01 DIAGNOSIS — I10 Essential (primary) hypertension: Secondary | ICD-10-CM | POA: Diagnosis not present

## 2025-01-01 DIAGNOSIS — I252 Old myocardial infarction: Secondary | ICD-10-CM | POA: Diagnosis not present

## 2025-01-01 DIAGNOSIS — R100 Acute abdomen: Secondary | ICD-10-CM | POA: Diagnosis present

## 2025-01-01 DIAGNOSIS — Z79899 Other long term (current) drug therapy: Secondary | ICD-10-CM | POA: Insufficient documentation

## 2025-01-01 DIAGNOSIS — E785 Hyperlipidemia, unspecified: Secondary | ICD-10-CM | POA: Insufficient documentation

## 2025-01-01 DIAGNOSIS — K219 Gastro-esophageal reflux disease without esophagitis: Secondary | ICD-10-CM | POA: Diagnosis not present

## 2025-01-01 LAB — COMPREHENSIVE METABOLIC PANEL WITH GFR
ALT: 92 U/L — ABNORMAL HIGH (ref 0–44)
AST: 83 U/L — ABNORMAL HIGH (ref 15–41)
Albumin: 5 g/dL (ref 3.5–5.0)
Alkaline Phosphatase: 154 U/L — ABNORMAL HIGH (ref 38–126)
Anion gap: 15 (ref 5–15)
BUN: 22 mg/dL (ref 8–23)
CO2: 27 mmol/L (ref 22–32)
Calcium: 10.6 mg/dL — ABNORMAL HIGH (ref 8.9–10.3)
Chloride: 101 mmol/L (ref 98–111)
Creatinine, Ser: 0.77 mg/dL (ref 0.61–1.24)
GFR, Estimated: >60 mL/min
Glucose, Bld: 90 mg/dL (ref 70–99)
Potassium: 5.3 mmol/L — ABNORMAL HIGH (ref 3.5–5.1)
Sodium: 143 mmol/L (ref 135–145)
Total Bilirubin: 1.2 mg/dL (ref 0.0–1.2)
Total Protein: 8.2 g/dL — ABNORMAL HIGH (ref 6.5–8.1)

## 2025-01-01 LAB — CBC WITH DIFFERENTIAL/PLATELET
Abs Immature Granulocytes: 0.02 10*3/uL (ref 0.00–0.07)
Basophils Absolute: 0 10*3/uL (ref 0.0–0.1)
Basophils Relative: 1 %
Eosinophils Absolute: 0 10*3/uL (ref 0.0–0.5)
Eosinophils Relative: 0 %
HCT: 46 % (ref 39.0–52.0)
Hemoglobin: 15.3 g/dL (ref 13.0–17.0)
Immature Granulocytes: 0 %
Lymphocytes Relative: 9 %
Lymphs Abs: 0.7 10*3/uL (ref 0.7–4.0)
MCH: 30.7 pg (ref 26.0–34.0)
MCHC: 33.3 g/dL (ref 30.0–36.0)
MCV: 92.4 fL (ref 80.0–100.0)
Monocytes Absolute: 0.5 10*3/uL (ref 0.1–1.0)
Monocytes Relative: 6 %
Neutro Abs: 6.5 10*3/uL (ref 1.7–7.7)
Neutrophils Relative %: 84 %
Platelets: 108 10*3/uL — ABNORMAL LOW (ref 150–400)
RBC: 4.98 MIL/uL (ref 4.22–5.81)
RDW: 14.5 % (ref 11.5–15.5)
WBC: 7.7 10*3/uL (ref 4.0–10.5)
nRBC: 0 % (ref 0.0–0.2)

## 2025-01-01 LAB — LIPASE, BLOOD: Lipase: 28 U/L (ref 11–51)

## 2025-01-01 MED ORDER — ATORVASTATIN CALCIUM 20 MG PO TABS: 40.0000 mg | ORAL_TABLET | Freq: Every day | ORAL | Status: DC

## 2025-01-01 MED ORDER — CARVEDILOL 6.25 MG PO TABS: 6.2500 mg | ORAL_TABLET | Freq: Two times a day (BID) | ORAL | Status: DC

## 2025-01-01 MED ORDER — ONDANSETRON HCL 4 MG/2ML IJ SOLN: 4.0000 mg | Freq: Four times a day (QID) | INTRAMUSCULAR | Status: DC | PRN

## 2025-01-01 MED ORDER — IOHEXOL 350 MG/ML SOLN: 100.0000 mL | Freq: Once | INTRAVENOUS | Status: DC | PRN

## 2025-01-01 MED ORDER — ACETAMINOPHEN 325 MG PO TABS: 650.0000 mg | ORAL_TABLET | Freq: Four times a day (QID) | ORAL | Status: DC | PRN

## 2025-01-01 MED ORDER — ACETAMINOPHEN 650 MG RE SUPP: 650.0000 mg | Freq: Four times a day (QID) | RECTAL | Status: DC | PRN

## 2025-01-01 MED ORDER — MAGNESIUM HYDROXIDE 400 MG/5ML PO SUSP: 30.0000 mL | Freq: Every day | ORAL | Status: DC | PRN

## 2025-01-01 MED ORDER — HYDROMORPHONE HCL 1 MG/ML IJ SOLN
1.0000 mg | Freq: Once | INTRAMUSCULAR | Status: AC
  Administered 2025-01-01: 1 mg via INTRAVENOUS
  Filled 2025-01-01: qty 1

## 2025-01-01 MED ORDER — TRAZODONE HCL 50 MG PO TABS: 25.0000 mg | ORAL_TABLET | Freq: Every evening | ORAL | Status: DC | PRN

## 2025-01-01 MED ORDER — ONDANSETRON HCL 4 MG PO TABS: 4.0000 mg | ORAL_TABLET | Freq: Four times a day (QID) | ORAL | Status: DC | PRN

## 2025-01-01 MED ORDER — PANTOPRAZOLE SODIUM 40 MG PO TBEC: 40.0000 mg | DELAYED_RELEASE_TABLET | Freq: Every day | ORAL | Status: DC

## 2025-01-01 MED ORDER — IOHEXOL 300 MG/ML  SOLN
100.0000 mL | Freq: Once | INTRAMUSCULAR | Status: AC | PRN
  Administered 2025-01-01: 100 mL via INTRAVENOUS

## 2025-01-01 MED ORDER — ENOXAPARIN SODIUM 40 MG/0.4ML IJ SOSY
40.0000 mg | PREFILLED_SYRINGE | INTRAMUSCULAR | Status: DC
  Filled 2025-01-01: qty 0.4

## 2025-01-01 MED ORDER — HYDROMORPHONE HCL 1 MG/ML IJ SOLN
0.5000 mg | Freq: Once | INTRAMUSCULAR | Status: AC
  Administered 2025-01-01: 0.5 mg via INTRAVENOUS
  Filled 2025-01-01: qty 0.5

## 2025-01-01 MED ORDER — NITROGLYCERIN 0.4 MG SL SUBL: 0.4000 mg | SUBLINGUAL_TABLET | SUBLINGUAL | Status: DC | PRN

## 2025-01-01 MED ORDER — SODIUM CHLORIDE 0.9 % IV SOLN: INTRAVENOUS | Status: DC

## 2025-01-01 MED ORDER — LIDOCAINE HCL URETHRAL/MUCOSAL 2 % EX GEL
1.0000 | Freq: Once | CUTANEOUS | Status: AC
  Administered 2025-01-01: 1 via TOPICAL
  Filled 2025-01-01: qty 6

## 2025-01-01 NOTE — ED Notes (Signed)
 First Nurse Note: Pt to ED via [facility] from home for G tube dislodgement. Per EMS there is blood around the G tube and he is holding it in place with a towel. Pt seen yesterday. Pt c/o 10/10 pain. Pt refused IV with EMS.   Vital signs stable with EMS.

## 2025-01-01 NOTE — ED Triage Notes (Signed)
 Pt comes in via [facility] with complaints of G-tube displacement. Pt complains of pain 10/10 at insertion site. Pt is alert and oriented x4.

## 2025-01-01 NOTE — H&P (Signed)
 "     [name]   PATIENT NAME: [name]    MR#:  [id number]  DATE OF BIRTH:  10-01-1961  DATE OF ADMISSION:  01/01/2025  PRIMARY CARE PHYSICIAN: Jyl Railing, MD   Patient is coming from: Home  REQUESTING/REFERRING PHYSICIAN: Jacolyn Guild, MD  CHIEF COMPLAINT:  Burning around G-tube and leak  HISTORY OF PRESENT ILLNESS:  [name] is a 64 y.o. male with medical history significant for laryngeal cancer, status post radiotherapy and tracheostomy, status post gastrostomy, anxiety, bipolar disorder, GERD, hepatitis C, hypertension, dyslipidemia, PAD, schizophrenia and seizure disorder, who presented to the emergency room with acute onset of pain and drainage around his gastrostomy tube site.  His G-tube was changed in the ED yesterday.  During the evening he started having severe burning pain around the ostomy site.  He admitted to brownish and dark red drainage around.  No nausea or vomiting.  No fever or chills.  He denied any chest pain or palpitations.  No cough or wheezing or dyspnea.  No dysuria, oliguria or hematuria or flank pain.  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  ED Course: When he came to the ER, BP was 154/83 with otherwise normal vital signs.  Labs revealed potassium of 5.3 and calcium  10.6 with alk phos 154, AST 83 and ALT 92 with total protein of 8.2.  CBC showed mild thrombocytopenia 108. EKG as reviewed by me : None. Imaging: Abdominal pelvic CT scan with contrast revealed the following: 1. Gastrostomy catheter extends into the stomach and through the pyloric channel into the proximal duodenum, and should be withdrawn several centimeters. 2. Small calculus in the distal aspect of the common bile duct, measuring approximately 5 mm, without intrahepatic ductal dilatation.  The G-tube was pulled back by the EDP.  The patient will be admitted to the medical-surgical observation bed for further evaluation  and management. PAST MEDICAL HISTORY:   Past Medical History:  Diagnosis Date   Anxiety    Bipolar affective (HCC)    takes Lithium  daily   Broken fingers    Coronary artery disease    a. 1997 PCI: RCA ([facility]); b. 2008 PCI: RCA; c. 05/2012 Cath: LCx 100 (previously stented - ? date), RCA stent w/ mild ISR, LAD nonobs, EF 35%; d. 06/2013 Cath: stable anatomy; e. 03/2018 Inf STEMI/PCI: LCX 100o/p ISR (CTO), RCA 168m/d (3.0x38 Resolute Onyx DES), EF 40-45%.    Depression    GERD (gastroesophageal reflux disease)    takes Protonix  daily   Hepatitis C    diagnosed in the past 2 wk (10/22/2012)   History of ETOH abuse    quit 2007   History of radiation therapy 12/17/17- 01/24/18   Larynx treated to 63 gy in 28 fx of 2.25 Gy   Hyperlipidemia    Hypertension    pt. denies - no longer taking blood pressure medications   Ischemic cardiomyopathy    a. 03/2018 LV gram: EF 40-45% @ time of MI; b. 08/2019 Echo: EF 55-60%, no rwma, impaired relaxation. Nl RV fxn.   Myocardial infarction (HCC)    x 4   PAD (peripheral artery disease)    a. 2010 PTA & stent to left CIA & left SFA; b. 05/2012 PTA & stent to distal LCIA; c. 10/2012 thrombectomy and stents to LCIA and LEIA; d. 11/2014 s/p Ao-Bifem bypass.   Schizophrenia (HCC)    Seizures (HCC)    when I get anxious (Stated last  seizure three months ago)   Tobacco abuse    Urinary frequency     PAST SURGICAL HISTORY:   Past Surgical History:  Procedure Laterality Date   ABDOMINAL AORTAGRAM N/A 10/22/2012   Procedure: ABDOMINAL AORTAGRAM;  Surgeon: Deatrice DELENA Cage, MD;  Location: [facility] CATH [facility];  Service: Cardiovascular;  Laterality: N/A;   ABDOMINAL AORTAGRAM N/A 11/11/2014   Procedure: ABDOMINAL EZELLA;  Surgeon: Deatrice DELENA Cage, MD;  Location: [facility] CATH [facility];  Service: Cardiovascular;  Laterality: N/A;   ANTERIOR CERVICAL DECOMP/DISCECTOMY FUSION  01/2011   plate & screw placement    ANTERIOR CERVICAL DECOMP/DISCECTOMY FUSION N/A 01/26/2017    Procedure: ANTERIOR CERVICAL DECOMPRESSION/DISCECTOMY FUSION CERVICAL FOUR- CERVICAL FIVE;  Surgeon: Rockey Peru, MD;  Location: [facility] OR;  Service: Neurosurgery;  Laterality: N/A;  ANTERIOR CERVICAL DECOMPRESSION/DISCECTOMY FUSION CERVICAL FOUR- CERVICAL FIVE   AORTA - BILATERAL FEMORAL ARTERY BYPASS GRAFT N/A 11/20/2014   Procedure: AORTA BIFEMORAL BYPASS GRAFT;  Surgeon: Lynwood JONETTA Collum, MD;  Location: [facility] OR;  Service: Vascular;  Laterality: N/A;   BACK SURGERY     BRAIN SURGERY  1989   subdural hematoma   CARDIAC CATHETERIZATION     Stent - 12   CHOLECYSTECTOMY N/A 06/29/2017   Procedure: LAPAROSCOPIC CHOLECYSTECTOMY WITH INTRAOPERATIVE CHOLANGIOGRAM, POSSIBLE OPEN;  Surgeon: Dessa Reyes ORN, MD;  Location: [facility] ORS;  Service: General;  Laterality: N/A;   CORONARY ANGIOPLASTY WITH STENT PLACEMENT  03/2007   to RCA   CORONARY/GRAFT ACUTE MI REVASCULARIZATION N/A 03/07/2018   Procedure: Coronary/Graft Acute MI Revascularization;  Surgeon: Ammon Blunt, MD;  Location: [facility] INVASIVE CV LAB;  Service: Cardiovascular;  Laterality: N/A;   HERNIA REPAIR     ILIAC ARTERY STENT  10/22/2012   2 (10/22/2012)   ILIAC ARTERY STENT  06/2012   left   INCISIONAL HERNIA REPAIR N/A 11/08/2015   Procedure: INCISIONAL HERNIA REPAIR WITH MYOFASCIAL RELEASE;  Surgeon: Donnice Bury, MD;  Location: [facility] OR;  Service: General;  Laterality: N/A;   INSERTION OF MESH N/A 11/08/2015   Procedure: INSERTION OF MESH;  Surgeon: Donnice Bury, MD;  Location: [facility] OR;  Service: General;  Laterality: N/A;   LEFT HEART CATH AND CORONARY ANGIOGRAPHY N/A 03/07/2018   Procedure: LEFT HEART CATH AND CORONARY ANGIOGRAPHY;  Surgeon: Ammon Blunt, MD;  Location: [facility] INVASIVE CV LAB;  Service: Cardiovascular;  Laterality: N/A;   LEFT HEART CATHETERIZATION WITH CORONARY ANGIOGRAM N/A 05/29/2012   Procedure: LEFT HEART CATHETERIZATION WITH CORONARY ANGIOGRAM;  Surgeon: Deatrice DELENA Cage, MD;  Location: [facility];   Service: Cardiovascular;  Laterality: N/A;   LIVER BIOPSY  10/2012   LOWER EXTREMITY ANGIOGRAM N/A 05/29/2012   Procedure: LOWER EXTREMITY ANGIOGRAM;  Surgeon: Deatrice DELENA Cage, MD;  Location: [facility] CATH [facility];  Service: Cardiovascular;  Laterality: N/A;   MICROLARYNGOSCOPY WITH CO2 LASER AND EXCISION OF VOCAL CORD LESION N/A 11/21/2017   Procedure: MICROLARYNGOSCOPY WITH EXCISION OF VOCAL CORD LESION;  Surgeon: Mable Lenis, MD;  [facility];  Service: ENT;  Laterality: N/A;   PERCUTANEOUS STENT INTERVENTION Left 05/29/2012   Procedure: PERCUTANEOUS STENT INTERVENTION;  Surgeon: Deatrice DELENA Cage, MD;  Location: [facility] CATH [facility];  Service: Cardiovascular;  Laterality: Left;   POSTERIOR FUSION CERVICAL SPINE  2012   SUBDURAL HEMATOMA EVACUATION VIA CRANIOTOMY      SOCIAL HISTORY:   Social History   Tobacco Use   Smoking status: Every Day    Current packs/day: 1.00    Average packs/day: 1 pack/day for 40.0 years (40.0 ttl pk-yrs)  Types: Cigarettes   Smokeless tobacco: Never   Tobacco comments:    ive slowed down recently   Substance Use Topics   Alcohol use: Yes    Alcohol/week: 0.0 standard drinks of alcohol    Comment: 1/2 gallon or 20 beers daily    FAMILY HISTORY:   Family History  Problem Relation Age of Onset   Emphysema Father    COPD Mother    Diabetes Other    Alcohol abuse Other    Hypertension Other    Hyperlipidemia Other    Seizures Other     DRUG ALLERGIES:  Allergies[1]  REVIEW OF SYSTEMS:   ROS As per history of present illness. All pertinent systems were reviewed above. Constitutional, HEENT, cardiovascular, respiratory, GI, GU, musculoskeletal, neuro, psychiatric, endocrine, integumentary and hematologic systems were reviewed and are otherwise negative/unremarkable except for positive findings mentioned above in the HPI.   MEDICATIONS AT HOME:   Prior to Admission medications  Medication Sig Start Date End Date Taking? Authorizing Provider   aspirin  EC 81 MG EC tablet Take 1 tablet (81 mg total) by mouth daily. 08/29/19   Clapacs, Norleen DASEN, MD  atorvastatin  (LIPITOR) 40 MG tablet Take 1 tablet (40 mg total) by mouth daily at 6 PM. 01/21/21 01/16/22  Vivienne Lonni Ingle, NP  cariprazine  4.5 MG CAPS Take 1 capsule (4.5 mg total) by mouth daily. 08/28/19   Clapacs, Norleen DASEN, MD  carvedilol  (COREG ) 6.25 MG tablet Take 1 tablet (6.25 mg total) by mouth 2 (two) times daily with a meal. 01/21/21 01/16/22  Vivienne Lonni Ingle, NP  divalproex  (DEPAKOTE  ER) 500 MG 24 hr tablet Take 500 mg by mouth in the morning and at bedtime.    [provider]  lithium  carbonate (ESKALITH ) 450 MG CR tablet Take 1 tablet (450 mg total) by mouth every 12 (twelve) hours. 08/28/19   Clapacs, Norleen DASEN, MD  nitroGLYCERIN  (NITROSTAT ) 0.4 MG SL tablet Place 1 tablet (0.4 mg total) under the tongue every 5 (five) minutes as needed for chest pain. Maximum of 3 doses. 01/21/21   Vivienne Lonni Ingle, NP  pantoprazole  (PROTONIX ) 40 MG tablet Take 1 tablet (40 mg total) by mouth daily at 12 noon. 08/28/19   Clapacs, Norleen DASEN, MD  valsartan  (DIOVAN ) 40 MG tablet Take 1 tablet (40 mg total) by mouth daily. 01/21/21 01/16/22  Vivienne Lonni Ingle, NP      VITAL SIGNS:  Blood pressure (!) 148/78, pulse 72, temperature 97.6 F (36.4 C), temperature source Oral, resp. rate 18, height 5' 7 (1.702 m), weight 53.7 kg, SpO2 98%.  PHYSICAL EXAMINATION:  Physical Exam  GENERAL:  64 y.o.-year-old patient lying in the bed with no acute distress.  EYES: Pupils equal, round, reactive to light and accommodation. No scleral icterus. Extraocular muscles intact.  HEENT: Head atraumatic, normocephalic. Oropharynx and nasopharynx clear.  NECK:  Supple, no jugular venous distention.  Tracheostomy tube in place.  No thyroid  enlargement, no tenderness.  LUNGS: Normal breath sounds bilaterally, no wheezing, rales,rhonchi or crepitation. No use of accessory muscles of  respiration.  CARDIOVASCULAR: Regular rate and rhythm, S1, S2 normal. No murmurs, rubs, or gallops.  ABDOMEN: Soft, nondistended, nontender. Bowel sounds present. No organomegaly or mass.  G-tube in place with mild erythema surrounding it site. EXTREMITIES: No pedal edema, cyanosis, or clubbing.  NEUROLOGIC: Cranial nerves II through XII are intact. Muscle strength 5/5 in all extremities. Sensation intact. Gait not checked.  PSYCHIATRIC: The patient is alert and oriented x 3.  Normal affect and good eye contact. SKIN: No obvious rash, lesion, or ulcer.   LABORATORY PANEL:   CBC Recent Labs  Lab 01/01/25 1755  WBC 7.7  HGB 15.3  HCT 46.0  PLT 108*   ------------------------------------------------------------------------------------------------------------------  Chemistries  Recent Labs  Lab 01/01/25 1755  NA 143  K 5.3*  CL 101  CO2 27  GLUCOSE 90  BUN 22  CREATININE 0.77  CALCIUM  10.6*  AST 83*  ALT 92*  ALKPHOS 154*  BILITOT 1.2   ------------------------------------------------------------------------------------------------------------------  Cardiac Enzymes No results for input(s): TROPONINI in the last 168 hours. ------------------------------------------------------------------------------------------------------------------  RADIOLOGY:  CT ABDOMEN PELVIS W CONTRAST Result Date: 01/01/2025 EXAM: CT ABDOMEN AND PELVIS WITH CONTRAST 01/01/2025 08:01:13 PM TECHNIQUE: CT of the abdomen and pelvis was performed with the administration of 100 mL of iohexol  (OMNIPAQUE ) 300 MG/ML solution. Multiplanar reformatted images are provided for review. Automated exposure control, iterative reconstruction, and/or weight-based adjustment of the mA/kV was utilized to reduce the radiation dose to as low as reasonably achievable. COMPARISON: None available. CLINICAL HISTORY: Acute abdominal pain and gastrostomy tube displacement. FINDINGS: LOWER CHEST: No acute abnormality. LIVER:  Scattered small cysts are noted within the liver. GALLBLADDER AND BILE DUCTS: The gallbladder has been surgically removed. The common bile duct is prominent, consistent with the post-cholecystectomy state. A small calculus is noted, however, in the distal aspect of the common bile duct. This measures approximately 5 mm in greatest dimension. No intrahepatic ductal dilatation is identified to suggest obstruction. SPLEEN: No acute abnormality. PANCREAS: No acute abnormality. ADRENAL GLANDS: No acute abnormality. KIDNEYS, URETERS AND BLADDER: Kidneys show renal cystic change, particularly on the right. No follow-up examination is recommended. No calculi or obstructive changes are noted. The bladder is well distended. No perinephric or periureteral stranding. GI AND BOWEL: The gastrostomy catheter extends into the stomach but also through the pyloric channel into the proximal duodenum. This should be withdrawn several centimeters. Small bowel is within normal limits. No obstructive or inflammatory changes of the colon are seen. The appendix is well visualized with inspissated barium. There is no bowel obstruction. PERITONEUM AND RETROPERITONEUM: No ascites. No free air. VASCULATURE: Atherosclerotic calcifications of the aorta are noted. Changes consistent with aortobifemoral bypass grafting are noted. The graft is widely patent. Normal runoff distally is seen. LYMPH NODES: No lymphadenopathy. REPRODUCTIVE ORGANS: The prostate is within normal limits. BONES AND SOFT TISSUES: Bilateral pars defects are noted at L5 without significant anterolisthesis. No focal soft tissue abnormality. IMPRESSION: 1. Gastrostomy catheter extends into the stomach and through the pyloric channel into the proximal duodenum, and should be withdrawn several centimeters. 2. Small calculus in the distal aspect of the common bile duct, measuring approximately 5 mm, without intrahepatic ductal dilatation. Electronically signed by: Oneil Devonshire MD  01/01/2025 08:09 PM EST RP Workstation: [id number]   DG ABDOMEN PEG TUBE LOCATION Result Date: 12/31/2024 CLINICAL DATA:  G-tube check. EXAM: ABDOMEN - 1 VIEW COMPARISON:  Abdominal radiograph dated 09/23/2023. FINDINGS: Percutaneous gastrostomy over the body of the stomach. Contrast injected through the tube opacifies the gastric fundus. No extraluminal contrast to suggest a leak. IMPRESSION: Percutaneous gastrostomy tube in the stomach. Electronically Signed   By: Vanetta Chou M.D.   On: 12/31/2024 15:25      IMPRESSION AND PLAN:  Assessment and Plan: * Choledocholithiasis - The patient has a 5 mm common bile duct stone. - He will be admitted to a medical-surgical observation bed. - Will keep him n.p.o. after midnight. - GI consult will  be obtained. - Dr. Unk is aware about the patient.  Essential hypertension - Will continue antihypertensive therapy.  GERD without esophagitis - Will resume PPI therapy.  Dyslipidemia - Will hold off statin therapy given elevated transaminases.  Bipolar disorder, currently in remission - Will continue Vraylar  and Depakote  ER as well as lithium    DVT prophylaxis: Lovenox .  Advanced Care Planning:  Code Status: The patient is DNR and DNI. Family Communication:  The plan of care was discussed in details with the patient (and family). I answered all questions. The patient agreed to proceed with the above mentioned plan. Further management will depend upon hospital course. Disposition Plan: Back to previous home environment Consults called: GI  All the records are reviewed and case discussed with ED provider.  Status is: Observation  I certify that at the time of admission, it is my clinical judgment that the patient will require hospital care extending less than 2 midnights.                            Dispo: The patient is from: Home              Anticipated d/c is to: Home              Patient currently is not medically stable to  d/c.              Difficult to place patient: No  Madison DELENA Peaches M.D on 01/02/2025 at 1:36 AM  [facility]   From 7 PM-7 AM, contact night-coverage [email or web address]  CC: Primary care physician; Jyl Railing, MD     [1]  Allergies Allergen Reactions   Lisinopril  Other (See Comments)    Other reaction(s): Elevated creatine and very elevated potassium  Elevated creatine and very elevated potassium Other reaction(s): Elevated creatine and very elevated potassium Other reaction(s): Elevated creatine and very elevated potassium Elevated creatine and very elevated potassium   "

## 2025-01-01 NOTE — ED Provider Notes (Signed)
 "  [facility] Provider Note    Event Date/Time   First MD Initiated Contact with Patient 01/01/25 1649     (approximate)   History   No chief complaint on file.   HPI  [name] is a 64 y.o. male with history of a gastrostomy tube and laryngeal cancer who presents with pain and drainage around his gastrostomy tube site.  The patient states that his gastrostomy tube was changed in the ED yesterday.  Yesterday evening he started to have severe burning pain around the ostomy site.  He denies other abdominal pain.  He states that he was unable to get feeds through the tube.  He also reports brownish/dark red drainage around the tube.  He denies any vomiting.  He denies any fever.  I reviewed the past medical records.  The patient was seen in the ED yesterday with dislodgment of the gastrostomy tube.  An 54 French tube was placed and confirmed by x-ray.  Previously he was admitted in October to ENT at [facility] for surgery.   Physical Exam   Triage Vital Signs: ED Triage Vitals  Encounter Vitals Group     BP 01/01/25 1405 (!) 155/99     Girls Systolic BP Percentile --      Girls Diastolic BP Percentile --      Boys Systolic BP Percentile --      Boys Diastolic BP Percentile --      Pulse Rate 01/01/25 1405 98     Resp 01/01/25 1405 19     Temp 01/01/25 1405 98.4 F (36.9 C)     Temp Source 01/01/25 1405 Oral     SpO2 01/01/25 1405 98 %     Weight 01/01/25 1401 130 lb 1.1 oz (59 kg)     Height 01/01/25 1401 5' 7 (1.702 m)     Head Circumference --      Peak Flow --      Pain Score 01/01/25 1400 10     Pain Loc --      Pain Education --      Exclude from Growth Chart --     Most recent vital signs: Vitals:   01/01/25 2132 01/01/25 2233  BP: 130/88 (!) 148/78  Pulse: 81 72  Resp: 18   Temp:  97.6 F (36.4 C)  SpO2: 100% 98%     General: Alert, uncomfortable appearing, no distress.  CV:  Good peripheral perfusion.  Resp:  Normal effort.   Abd:  Soft with tenderness throughout the left upper quadrant on the tube site.  No distention.  Other:  Gastrostomy tube site intact with slight erythema around it.  There is scant brownish drainage.  There is no purulent discharge.   ED Results / Procedures / Treatments   Labs (all labs ordered are listed, but only abnormal results are displayed) Labs Reviewed  COMPREHENSIVE METABOLIC PANEL WITH GFR - Abnormal; Notable for the following components:      Result Value   Potassium 5.3 (*)    Calcium  10.6 (*)    Total Protein 8.2 (*)    AST 83 (*)    ALT 92 (*)    Alkaline Phosphatase 154 (*)    All other components within normal limits  CBC WITH DIFFERENTIAL/PLATELET - Abnormal; Notable for the following components:   Platelets 108 (*)    All other components within normal limits  LIPASE, BLOOD  HIV ANTIBODY (ROUTINE TESTING W REFLEX)  BASIC METABOLIC PANEL  WITH GFR  CBC     EKG     RADIOLOGY  CT abdomen/pelvis: I independently viewed and interpreted the images; there are no dilated bowel loops, free air or free fluid, or any fluid collections near the G-tube site.  Radiology report indicates the following:  IMPRESSION:  1. Gastrostomy catheter extends into the stomach and through the pyloric  channel into the proximal duodenum, and should be withdrawn several  centimeters.  2. Small calculus in the distal aspect of the common bile duct, measuring  approximately 5 mm, without intrahepatic ductal dilatation.     PROCEDURES:  Critical Care performed: No  Procedures   MEDICATIONS ORDERED IN ED: Medications  atorvastatin  (LIPITOR) tablet 40 mg (has no administration in time range)  carvedilol  (COREG ) tablet 6.25 mg (has no administration in time range)  nitroGLYCERIN  (NITROSTAT ) SL tablet 0.4 mg (has no administration in time range)  pantoprazole  (PROTONIX ) EC tablet 40 mg (has no administration in time range)  enoxaparin  (LOVENOX ) injection 40 mg (has no  administration in time range)  0.9 %  sodium chloride  infusion (has no administration in time range)  acetaminophen  (TYLENOL ) tablet 650 mg (has no administration in time range)    Or  acetaminophen  (TYLENOL ) suppository 650 mg (has no administration in time range)  traZODone  (DESYREL ) tablet 25 mg (has no administration in time range)  magnesium  hydroxide (MILK OF MAGNESIA) suspension 30 mL (has no administration in time range)  ondansetron  (ZOFRAN ) tablet 4 mg (has no administration in time range)    Or  ondansetron  (ZOFRAN ) injection 4 mg (has no administration in time range)  HYDROmorphone  (DILAUDID ) injection 1 mg (1 mg Intravenous Given 01/01/25 1805)  iohexol  (OMNIPAQUE ) 300 MG/ML solution 100 mL (100 mLs Intravenous Contrast Given 01/01/25 1945)  lidocaine  (XYLOCAINE ) 2 % jelly 1 Application (1 Application Topical Given 01/01/25 2125)  HYDROmorphone  (DILAUDID ) injection 0.5 mg (0.5 mg Intravenous Given 01/01/25 2125)     IMPRESSION / MDM / ASSESSMENT AND PLAN / ED COURSE  I reviewed the triage vital signs and the nursing notes.  65 year old male with PMH as noted above status post replacement of his gastrostomy tube in the ED yesterday now presents with severe pain around the tube site as well as drainage.  The tube was changed from a 10 French to an 23 French since we did not have the larger size.  The patient states he has been unable to pass feeds.  Differential diagnosis includes, but is not limited to, skin irritation around the tube site, dislodgment of the tube, passage of the tube into a false tract (although placement was confirmed yesterday), infection or abscess, hematoma, irritation or less likely perforation of the stomach.  We will obtain lab workup, CT abdomen/pelvis, and reassess.  Patient's presentation is most consistent with acute presentation with potential threat to life or bodily function.  ----------------------------------------- 9:43 PM on  01/01/2025 -----------------------------------------  CT shows that the G-tube is advanced too far with the tip in the duodenum but without other acute complications.  I retracted the tube several centimeters without removing it or deflating the balloon, and the patient reports that it is much more comfortable.  There is also a bile duct stone seen on the CT.  The patient's CMP does show elevated LFTs and alkaline phosphatase although the bilirubin is normal.  CBC shows a normal white count.  Lipase is normal.  I consulted Dr. Unk from gastroenterology who recommended admitting the patient as he may need an ERCP.  The patient is aware of that stone, but looking at prior labs from a few years ago his LFTs were not elevated.  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  I consulted Dr. Lawence from the hospitalist service; based on our discussion he agrees to evaluate the patient for admission.   FINAL CLINICAL IMPRESSION(S) / ED DIAGNOSES   Final diagnoses:  Drainage from gastrostomy tube site [facility])  Calculus of bile duct without cholecystitis and without obstruction     Rx / DC Orders   ED Discharge Orders     None        Note:  This document was prepared using Dragon voice recognition software and may include unintentional dictation errors.GLENWOOD Jacolyn Pae, MD 01/01/25 2326  "

## 2025-01-02 ENCOUNTER — Observation Stay: Payer: MEDICAID

## 2025-01-02 DIAGNOSIS — E785 Hyperlipidemia, unspecified: Secondary | ICD-10-CM | POA: Insufficient documentation

## 2025-01-02 DIAGNOSIS — I10 Essential (primary) hypertension: Secondary | ICD-10-CM | POA: Insufficient documentation

## 2025-01-02 DIAGNOSIS — E43 Unspecified severe protein-calorie malnutrition: Secondary | ICD-10-CM | POA: Insufficient documentation

## 2025-01-02 DIAGNOSIS — K8050 Calculus of bile duct without cholangitis or cholecystitis without obstruction: Secondary | ICD-10-CM | POA: Diagnosis not present

## 2025-01-02 DIAGNOSIS — K222 Esophageal obstruction: Secondary | ICD-10-CM | POA: Diagnosis not present

## 2025-01-02 DIAGNOSIS — K219 Gastro-esophageal reflux disease without esophagitis: Secondary | ICD-10-CM | POA: Insufficient documentation

## 2025-01-02 LAB — CBC
HCT: 41.1 % (ref 39.0–52.0)
Hemoglobin: 13.6 g/dL (ref 13.0–17.0)
MCH: 31.2 pg (ref 26.0–34.0)
MCHC: 33.1 g/dL (ref 30.0–36.0)
MCV: 94.3 fL (ref 80.0–100.0)
Platelets: 98 10*3/uL — ABNORMAL LOW (ref 150–400)
RBC: 4.36 MIL/uL (ref 4.22–5.81)
RDW: 14.5 % (ref 11.5–15.5)
WBC: 7.1 10*3/uL (ref 4.0–10.5)
nRBC: 0 % (ref 0.0–0.2)

## 2025-01-02 LAB — BASIC METABOLIC PANEL WITH GFR
Anion gap: 12 (ref 5–15)
BUN: 23 mg/dL (ref 8–23)
CO2: 29 mmol/L (ref 22–32)
Calcium: 9.9 mg/dL (ref 8.9–10.3)
Chloride: 104 mmol/L (ref 98–111)
Creatinine, Ser: 0.76 mg/dL (ref 0.61–1.24)
GFR, Estimated: >60 mL/min
Glucose, Bld: 75 mg/dL (ref 70–99)
Potassium: 4.3 mmol/L (ref 3.5–5.1)
Sodium: 145 mmol/L (ref 135–145)

## 2025-01-02 LAB — HIV ANTIBODY (ROUTINE TESTING W REFLEX): HIV Screen 4th Generation wRfx: NONREACTIVE

## 2025-01-02 LAB — HEPATIC FUNCTION PANEL
ALT: 86 U/L — ABNORMAL HIGH (ref 0–44)
AST: 65 U/L — ABNORMAL HIGH (ref 15–41)
Albumin: 4.5 g/dL (ref 3.5–5.0)
Alkaline Phosphatase: 145 U/L — ABNORMAL HIGH (ref 38–126)
Bilirubin, Direct: 0.5 mg/dL — ABNORMAL HIGH (ref 0.0–0.2)
Indirect Bilirubin: 0.7 mg/dL (ref 0.3–0.9)
Total Bilirubin: 1.2 mg/dL (ref 0.0–1.2)
Total Protein: 7.4 g/dL (ref 6.5–8.1)

## 2025-01-02 MED ORDER — OSMOLITE 1.5 CAL PO LIQD: 237.0000 mL | Freq: Four times a day (QID) | ORAL | Status: DC

## 2025-01-02 MED ORDER — FREE WATER: 200.0000 mL | Freq: Four times a day (QID) | Status: DC

## 2025-01-02 MED ORDER — ORAL CARE MOUTH RINSE: 15.0000 mL | OROMUCOSAL | Status: DC | PRN

## 2025-01-02 MED ORDER — OSMOLITE 1.5 CAL PO LIQD: 237.0000 mL | Freq: Every day | ORAL | Status: DC

## 2025-01-02 MED ORDER — FREE WATER: 200.0000 mL | Freq: Every day | Status: DC

## 2025-01-02 MED ORDER — DIATRIZOATE MEGLUMINE & SODIUM 66-10 % PO SOLN
30.0000 mL | Freq: Once | ORAL | Status: AC
  Administered 2025-01-02: 30 mL via ORAL

## 2025-01-02 MED ORDER — OSMOLITE 1.2 CAL PO LIQD: 1000.0000 mL | ORAL | Status: DC

## 2025-01-02 MED ORDER — ORAL CARE MOUTH RINSE
15.0000 mL | OROMUCOSAL | Status: DC
  Administered 2025-01-02 (×2): 15 mL via OROMUCOSAL

## 2025-01-02 NOTE — Discharge Summary (Signed)
 "  Physician Discharge Summary  ELICEO [name] FMW:[id number] DOB: [birth year] DOA: 01/01/2025  PCP: Jyl Railing, MD  Admit date: 01/01/2025 Discharge date: 01/02/2025  Admitted from: Home Discharge disposition: Home  Recommendations at discharge:  If you start having abdominal pain and elevated liver enzymes again, may need repeat attempt of ERCP at [facility]   Subjective: Patient was seen and examined this morning. Pleasant middle-aged Caucasian male.  Lying down in bed.  Not in distress. Has tracheostomy tube.  Speaks with Passy-Muir valve on. Abdominal pain has improved. Remains afebrile, hemodynamically stable, breathing on room air CBC, BMP normal  Brief narrative: [name] is a 64 y.o. 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 1/28, patient presented to the ED with acute onset pain and drainage around G-tube site.  G-tube came out the other day.  In the ED, G-tube was replaced and discharged home.  At home, he felt severe burning and pain around the ostomy site along with bloody drainage and is brought to the ED again.  In the ED, patient was afebrile, hemodynamically stable, breathing on room air Initial labs with WBC count normal, potassium 5.3, calcium  10.6, AST, ALT, alk phos elevated, total bili normal. CT abdomen pelvis showed --Gastrostomy catheter extends into the stomach and through the pyloric channel into the proximal duodenum, and should be withdrawn several centimeters. --Small calculus in the distal aspect of the common bile duct, measuring approximately 5 mm, without intrahepatic ductal dilatation.  As advised by the radiologist, the G-tube was pulled back by EDP. Admitted to [facility] GI consulted.  Hospital course: Acute abdominal pain Presented with 10 out of 10  abdominal pain.  Suspect secondary to G-tube site trauma versus CBD stone See below for individual issues Currently only on Tylenol  as needed for pain  G-tube site pain and drainage Patient had G-tube in place since treatment of laryngeal cancer. 1/27, it was replaced in the ED.  CT abdomen from 1/28 showed that the catheter was in the proximal duodenum.  EDP pulled back the G-tube.  Repeat x-ray this morning showed that the PEG tube is in the stomach.  Can use it for tube feeding.  Choledocholithiasis CT scan noted a small 5 mm calculus in distal CBD  Mild elevated in AST, ALT and alk phos noted.  Bilirubin not elevated. Repeat LFTs this morning shows gradual improvement Seen by GI Dr. Jinny.  Previous imagings reviewed.  It seems patient had had same findings in prior MRIs as well in April 2024.  At that time, ERCP was attempted but apparently unable to negotiate through the esophageal sphincter and hence the procedure was aborted. LFTs are gradually improving this morning.  At this time, GI does not recommend ERCP.  Can be discharged home.  In case patient started to have worsening symptoms again and if LFTs are rising, he needs to be at [facility] for possible reattempt of ERCP. Follow-up as an outpatient. Recent Labs  Lab 01/01/25 1755 01/02/25 0540  AST 83* 65*  ALT 92* 86*  ALKPHOS 154* 145*  BILITOT 1.2 1.2  BILIDIR  --  0.5*  IBILI  --  0.7  PROT 8.2* 7.4  ALBUMIN  5.0 4.5  LIPASE 28  --   PLT 108* 98*   Hyperkalemia Potassium level was elevated 5.3 on admission.  Improved to 4.3 on repeat labs this  morning Recent Labs  Lab 01/01/25 1755 01/02/25 0540  NA 143 145  K 5.3* 4.3  CL 101 104  CO2 27 29  GLUCOSE 90 75  BUN 22 23  CREATININE 0.77 0.76  CALCIUM  10.6* 9.9     Essential hypertension Not seem to be on any antihypertensives currently  GERD without esophagitis H/o tracheoesophageal fistula TEF was apparently treated last year. Continue PPI.   HLD  Does not  seem to be on statin currently   Bipolar disorder PTA meds-med list shows Vraylar , lithium , Depakote  ER.  But patient states he has not been taking these medicines for a long time.  Mobility: Encourage ambulation  Goals of care   Code Status: Limited: Do not attempt resuscitation (DNR) -DNR-LIMITED -Do Not Intubate/DNI    Diet:  Diet Order             Diet NPO time specified  Diet effective now           Diet general                   Nutritional status:  Body mass index is 18.54 kg/m.  Nutrition Problem: Severe Malnutrition Etiology: chronic illness (laryngeal cancer) Signs/Symptoms: moderate fat depletion, severe fat depletion, moderate muscle depletion, severe muscle depletion, percent weight loss  Wounds:  -    Discharge Medications:   Allergies as of 01/02/2025       Reactions   Lisinopril  Other (See Comments)   Other reaction(s): Elevated creatine and very elevated potassium Elevated creatine and very elevated potassium Other reaction(s): Elevated creatine and very elevated potassium Other reaction(s): Elevated creatine and very elevated potassium Elevated creatine and very elevated potassium        Medication List     STOP taking these medications    aspirin  EC 81 MG tablet       TAKE these medications    atorvastatin  40 MG tablet Commonly known as: LIPITOR Take 1 tablet (40 mg total) by mouth daily at 6 PM.   Cariprazine  HCl 4.5 MG Caps Take 1 capsule (4.5 mg total) by mouth daily.   carvedilol  6.25 MG tablet Commonly known as: COREG  Take 1 tablet (6.25 mg total) by mouth 2 (two) times daily with a meal.   divalproex  500 MG 24 hr tablet Commonly known as: DEPAKOTE  ER Take 500 mg by mouth in the morning and at bedtime.   lithium  carbonate 450 MG ER tablet Commonly known as: ESKALITH  Take 1 tablet (450 mg total) by mouth every 12 (twelve) hours.   nitroGLYCERIN  0.4 MG SL tablet Commonly known as: NITROSTAT  Place 1 tablet (0.4  mg total) under the tongue every 5 (five) minutes as needed for chest pain. Maximum of 3 doses.   pantoprazole  40 MG tablet Commonly known as: PROTONIX  Take 1 tablet (40 mg total) by mouth daily at 12 noon.   valsartan  40 MG tablet Commonly known as: DIOVAN  Take 1 tablet (40 mg total) by mouth daily.         Follow ups:    Follow-up Information     Jyl Railing, MD Follow up.   Specialty: Family Medicine Contact information: [address] [place] [address] [phone]                 Discharge Instructions:   Discharge Instructions     Call MD for:  difficulty breathing, headache or visual disturbances   Complete by: As directed    Call MD for:  extreme  fatigue   Complete by: As directed    Call MD for:  hives   Complete by: As directed    Call MD for:  persistant dizziness or light-headedness   Complete by: As directed    Call MD for:  persistant nausea and vomiting   Complete by: As directed    Call MD for:  severe uncontrolled pain   Complete by: As directed    Call MD for:  temperature >100.4   Complete by: As directed    Diet general   Complete by: As directed    Continue tube feeding as before   Discharge instructions   Complete by: As directed    Recommendations at discharge:   If you start having abdominal pain and elevated liver enzymes again, may need repeat attempt of ERCP at [facility].  General discharge instructions: Follow with Primary MD Jyl Railing, MD in 7 days  Please request your PCP  to go over your hospital tests, procedures, radiology results at the follow up. Please get your medicines reviewed and adjusted.  Your PCP may decide to repeat certain labs or tests as needed. Do not drive, operate heavy machinery, perform activities at heights, swimming or participation in water  activities or provide baby sitting services if your were admitted for syncope or siezures until you have seen by Primary MD or a Neurologist and  advised to do so again. [place]  Controlled Substance Reporting System database was reviewed. Do not drive, operate heavy machinery, perform activities at heights, swim, participate in water  activities or provide baby-sitting services while on medications for pain, sleep and mood until your outpatient physician has reevaluated you and advised to do so again.  You are strongly recommended to comply with the dose, frequency and duration of prescribed medications. Activity: As tolerated with Full fall precautions use walker/cane & assistance as needed Avoid using any recreational substances like cigarette, tobacco, alcohol, or non-prescribed drug. If you experience worsening of your admission symptoms, develop shortness of breath, life threatening emergency, suicidal or homicidal thoughts you must seek medical attention immediately by calling 911 or calling your MD immediately  if symptoms less severe. You must read complete instructions/literature along with all the possible adverse reactions/side effects for all the medicines you take and that have been prescribed to you. Take any new medicine only after you have completely understood and accepted all the possible adverse reactions/side effects.  Wear Seat belts while driving. You were cared for by a hospitalist during your hospital stay. If you have any questions about your discharge medications or the care you received while you were in the hospital after you are discharged, you can call the unit and ask to speak with the hospitalist or the covering physician. Once you are discharged, your primary care physician will handle any further medical issues. Please note that NO REFILLS for any discharge medications will be authorized once you are discharged, as it is imperative that you return to your primary care physician (or establish a relationship with a primary care physician if you do not have one).   Increase activity slowly   Complete by: As  directed        Discharge Exam:   Vitals:   01/01/25 2132 01/01/25 2233 01/02/25 0429 01/02/25 0737  BP: 130/88 (!) 148/78 133/69 132/77  Pulse: 81 72 (!) 51 (!) 51  Resp: 18   18  Temp:  97.6 F (36.4 C) 98.7 F (37.1 C) 99 F (37.2 C)  TempSrc:  Oral Oral   SpO2: 100% 98% 99% 100%  Weight:  53.7 kg    Height:  5' 7 (1.702 m)      Body mass index is 18.54 kg/m.  General exam: Pleasant, middle-aged Caucasian male.  Looks older for his stated age Skin: No rashes, lesions or ulcers. HEENT: Atraumatic, normocephalic, no obvious bleeding.  Has tracheostomy anteriorly Lungs: Clear to auscultation bilaterally,  CVS: S1, S2, no murmur,   GI/Abd: Soft, nontender, nondistended, bowel sound present, PEG tube site intact with mild serosanguineous drainage CNS: Alert, awake, oriented x 3 Psychiatry: Mood appropriate Extremities: No pedal edema, no calf tenderness,    The results of significant diagnostics from this hospitalization (including imaging, microbiology, ancillary and laboratory) are listed below for reference.    Procedures and Diagnostic Studies:   CT ABDOMEN PELVIS W CONTRAST Result Date: 01/01/2025 EXAM: CT ABDOMEN AND PELVIS WITH CONTRAST 01/01/2025 08:01:13 PM TECHNIQUE: CT of the abdomen and pelvis was performed with the administration of 100 mL of iohexol  (OMNIPAQUE ) 300 MG/ML solution. Multiplanar reformatted images are provided for review. Automated exposure control, iterative reconstruction, and/or weight-based adjustment of the mA/kV was utilized to reduce the radiation dose to as low as reasonably achievable. COMPARISON: None available. CLINICAL HISTORY: Acute abdominal pain and gastrostomy tube displacement. FINDINGS: LOWER CHEST: No acute abnormality. LIVER: Scattered small cysts are noted within the liver. GALLBLADDER AND BILE DUCTS: The gallbladder has been surgically removed. The common bile duct is prominent, consistent with the post-cholecystectomy state. A  small calculus is noted, however, in the distal aspect of the common bile duct. This measures approximately 5 mm in greatest dimension. No intrahepatic ductal dilatation is identified to suggest obstruction. SPLEEN: No acute abnormality. PANCREAS: No acute abnormality. ADRENAL GLANDS: No acute abnormality. KIDNEYS, URETERS AND BLADDER: Kidneys show renal cystic change, particularly on the right. No follow-up examination is recommended. No calculi or obstructive changes are noted. The bladder is well distended. No perinephric or periureteral stranding. GI AND BOWEL: The gastrostomy catheter extends into the stomach but also through the pyloric channel into the proximal duodenum. This should be withdrawn several centimeters. Small bowel is within normal limits. No obstructive or inflammatory changes of the colon are seen. The appendix is well visualized with inspissated barium. There is no bowel obstruction. PERITONEUM AND RETROPERITONEUM: No ascites. No free air. VASCULATURE: Atherosclerotic calcifications of the aorta are noted. Changes consistent with aortobifemoral bypass grafting are noted. The graft is widely patent. Normal runoff distally is seen. LYMPH NODES: No lymphadenopathy. REPRODUCTIVE ORGANS: The prostate is within normal limits. BONES AND SOFT TISSUES: Bilateral pars defects are noted at L5 without significant anterolisthesis. No focal soft tissue abnormality. IMPRESSION: 1. Gastrostomy catheter extends into the stomach and through the pyloric channel into the proximal duodenum, and should be withdrawn several centimeters. 2. Small calculus in the distal aspect of the common bile duct, measuring approximately 5 mm, without intrahepatic ductal dilatation. Electronically signed by: Oneil Devonshire MD 01/01/2025 08:09 PM EST RP Workstation: [id number]     Labs:   Basic Metabolic Panel: Recent Labs  Lab 01/01/25 1755 01/02/25 0540  NA 143 145  K 5.3* 4.3  CL 101 104  CO2 27 29  GLUCOSE 90 75   BUN 22 23  CREATININE 0.77 0.76  CALCIUM  10.6* 9.9   GFR Estimated Creatinine Clearance: 70.9 mL/min (by C-G formula based on SCr of 0.76 mg/dL). Liver Function Tests: Recent Labs  Lab 01/01/25 1755 01/02/25 0540  AST 83* 65*  ALT 92*  86*  ALKPHOS 154* 145*  BILITOT 1.2 1.2  PROT 8.2* 7.4  ALBUMIN  5.0 4.5   Recent Labs  Lab 01/01/25 1755  LIPASE 28   No results for input(s): AMMONIA in the last 168 hours. Coagulation profile No results for input(s): INR, PROTIME in the last 168 hours.  CBC: Recent Labs  Lab 01/01/25 1755 01/02/25 0540  WBC 7.7 7.1  NEUTROABS 6.5  --   HGB 15.3 13.6  HCT 46.0 41.1  MCV 92.4 94.3  PLT 108* 98*   Cardiac Enzymes: No results for input(s): CKTOTAL, CKMB, CKMBINDEX, TROPONINI in the last 168 hours. BNP: Invalid input(s): POCBNP CBG: No results for input(s): GLUCAP in the last 168 hours. D-Dimer No results for input(s): DDIMER in the last 72 hours. Hgb A1c No results for input(s): HGBA1C in the last 72 hours. Lipid Profile No results for input(s): CHOL, HDL, LDLCALC, TRIG, CHOLHDL, LDLDIRECT in the last 72 hours. Thyroid  function studies No results for input(s): TSH, T4TOTAL, T3FREE, THYROIDAB in the last 72 hours.  Invalid input(s): FREET3 Anemia work up No results for input(s): VITAMINB12, FOLATE, FERRITIN, TIBC, IRON, RETICCTPCT in the last 72 hours. Microbiology No results found for this or any previous visit (from the past 240 hours).  Time coordinating discharge: 45 minutes  Signed: Siana Panameno  [facility] 01/02/2025, 2:42 PM  "

## 2025-01-02 NOTE — Progress Notes (Signed)
 Initial Nutrition Assessment  DOCUMENTATION CODES:   Severe malnutrition in context of chronic illness  INTERVENTION:   -Once able to resume feeds:   237 ml Osmolite 1.5 6 times daily   100 ml free water  flush before and after each feeding administration  Tube feeding regimen provides 2130 kcal (100% of needs), 89 grams of protein, and 905 ml of H2O. Total free water : 2105 ml daily   -Case discussed with RN and MD  NUTRITION DIAGNOSIS:   Severe Malnutrition related to chronic illness (laryngeal cancer) as evidenced by moderate fat depletion, severe fat depletion, moderate muscle depletion, severe muscle depletion, percent weight loss.  GOAL:   Patient will meet greater than or equal to 90% of their needs  MONITOR:   TF tolerance  REASON FOR ASSESSMENT:   Consult Enteral/tube feeding initiation and management  ASSESSMENT:   64 y.o. male with medical history significant for laryngeal cancer, status post radiotherapy and tracheostomy, status post gastrostomy, anxiety, bipolar disorder, GERD, hepatitis C, hypertension, dyslipidemia, PAD, schizophrenia and seizure disorder, who presented to the emergency room with acute onset of pain and drainage around his gastrostomy tube site.  Patient admitted with choledocholithiasis.   1/28- s/p x-ray for g-tube check (PEG in stomach)  Reviewed   Case discussed with MD, who reports patient is NPO for GI eval. MD reports g-tube was deep into the duodenum per CT scan, which ED pulled back; KUB is pending.   Per GI notes, patient with a 5 mm common bile duct stone. Patient underwent ERCP in 2024, however, scope could not be passed secondary to stricture and known tracheoesophageal fistula. GI reports ERCP could be dangerous without assistance of tertiary care center. Per GI, care options include: repeating LFTs and if increasing transfer to [facility] for a possible repeat attempt at an ERCP versus outpatient follow-up with them if the liver  enzymes do not increase.   Spoke with patient at bedside, who has a trach and communicates with PMV. Patient expressed frustration about not feeling well and being in the hospital. He is frustrated that he is unable to eat and states he is hungry. Patient and LPN both confirm patient does not eat anything by mouth, but has been approved to eat ice pops for comfort. Patient confirms that he receives 250 ml Isosource 1.5 PTA, which was just advanced to 6 times per day (last RD noted 4 times per day) secondary to weight loss. Patient reports he has lost about 50# over the past 4 months and expressed concern about this; he is hopeful that the increased feeds will help improve his nutritional status. RD discussed plan of care with patient and he understands why he is currently NPO, but assured him appropriate TF orders would be placed once he is ready to eat.   Reviewed weight history. Patient has experienced a 9% weight loss over the past 4 months, which is significant for time frame.   Discussed TF orders with LPN. Discussed that Osmolite 1.5 is the formulary equivalent of Isosource 1.5.   Medications reviewed and include 0.9% sodium chloride  infusion @ 100 ml/hr.   Labs reviewed.    NUTRITION - FOCUSED PHYSICAL EXAM:  Flowsheet Row Most Recent Value  Orbital Region Moderate depletion  Upper Arm Region Severe depletion  Thoracic and Lumbar Region Severe depletion  Buccal Region Moderate depletion  Temple Region Severe depletion  Clavicle Bone Region Severe depletion  Clavicle and Acromion Bone Region Severe depletion  Scapular Bone Region Severe depletion  Dorsal  Hand Moderate depletion  Patellar Region Severe depletion  Anterior Thigh Region Severe depletion  Posterior Calf Region Severe depletion  Edema (RD Assessment) None  Hair Reviewed  Eyes Reviewed  Mouth Reviewed  Skin Reviewed  Nails Reviewed    Diet Order:   Diet Order             Diet NPO time specified  Diet effective  now           Diet general                   EDUCATION NEEDS:   Education needs have been addressed  Skin:  Skin Assessment: Reviewed RN Assessment  Last BM:  12/31/24  Height:   Ht Readings from Last 1 Encounters:  01/01/25 5' 7 (1.702 m)    Weight:   Wt Readings from Last 1 Encounters:  01/01/25 53.7 kg    Ideal Body Weight:  67.3 kg  BMI:  Body mass index is 18.54 kg/m.  Estimated Nutritional Needs:   Kcal:  2150-2350  Protein:  105-120 grams  Fluid:  2.0-2.2 L    Margery ORN, RD, LDN, CDCES Registered Dietitian III Certified Diabetes Care and Education Specialist If unable to reach this RD, please use RD Inpatient group chat on secure chat between hours of 8am-4 pm daily

## 2025-01-02 NOTE — Assessment & Plan Note (Signed)
-   The patient has a 5 mm common bile duct stone. - He will be admitted to a medical-surgical observation bed. - Will keep him n.p.o. after midnight. - GI consult will be obtained. - Dr. Unk is aware about the patient.

## 2025-01-02 NOTE — Assessment & Plan Note (Signed)
-   Will hold off statin therapy given elevated transaminases.

## 2025-01-02 NOTE — Consult Note (Signed)
 "      Philip Copping, MD [facility]  [address] [place], [place] [address] Phone: [phone] Fax : [phone]  Consultation  Referring Provider:     Dr. Lawence Primary Care Physician:  Jyl Railing, MD Primary Gastroenterologist: [facility]         Reason for Consultation:     Common bile duct stone  Date of Admission:  01/01/2025 Date of Consultation:  01/02/2025         HPI:   [name] is a 64 y.o. male with history of laryngeal with a history of radiation.  The patient had come in for a G-tube drainage and the gastrostomy tube was changed in the ED.  The patient had reported burning at the site.  The patient denied any other discomfort but had a CT scan that showed:  IMPRESSION: 1. Gastrostomy catheter extends into the stomach and through the pyloric channel into the proximal duodenum, and should be withdrawn several centimeters. 2. Small calculus in the distal aspect of the common bile duct, measuring approximately 5 mm, without intrahepatic ductal dilatation.  The patient had slightly elevated liver enzymes but normal total bilirubin.  The patient had previously been seen with a MRCP back in April 2024 showing:  IMPRESSION:   1. Redemonstration of a common bile duct stone measuring 5 mm. The common bile duct is similarly dilated, which may be in part related to postcholecystectomy status as there is no significant intrahepatic duct dilation. However given nonvisualization of the very distal CBD an underlying stricture cannot be excluded. No mass lesion visualized. Correlate with biliary labs.  2.  Heterogeneous right lower lung lobe opacities are increased from prior which may reflect aspiration/infection or atelectasis. There is a new trace right pleural effusion.   At that time the patient underwent an attempted ERCP with the results of that showing:  EGD: Attempts to negotiate the diagnostic upper endoscope past the upper  esophageal sphincter were  unsuccessful. Procedure was aborted.   The patient also had a barium swallow at that time that showed:  Thin liquids: Significant aspiration from TE fistula.  Purees: Significant aspiration from TE fistula.   Past Medical History:  Diagnosis Date   Anxiety    Bipolar affective (HCC)    takes Lithium  daily   Broken fingers    Coronary artery disease    a. 1997 PCI: RCA ([facility]); b. 2008 PCI: RCA; c. 05/2012 Cath: LCx 100 (previously stented - ? date), RCA stent w/ mild ISR, LAD nonobs, EF 35%; d. 06/2013 Cath: stable anatomy; e. 03/2018 Inf STEMI/PCI: LCX 100o/p ISR (CTO), RCA 148m/d (3.0x38 Resolute Onyx DES), EF 40-45%.    Depression    GERD (gastroesophageal reflux disease)    takes Protonix  daily   Hepatitis C    diagnosed in the past 2 wk (10/22/2012)   History of ETOH abuse    quit 2007   History of radiation therapy 12/17/17- 01/24/18   Larynx treated to 63 gy in 28 fx of 2.25 Gy   Hyperlipidemia    Hypertension    pt. denies - no longer taking blood pressure medications   Ischemic cardiomyopathy    a. 03/2018 LV gram: EF 40-45% @ time of MI; b. 08/2019 Echo: EF 55-60%, no rwma, impaired relaxation. Nl RV fxn.   Myocardial infarction (HCC)    x 4   PAD (peripheral artery disease)    a. 2010 PTA & stent to left CIA & left SFA; b. 05/2012 PTA &  stent to distal LCIA; c. 10/2012 thrombectomy and stents to LCIA and LEIA; d. 11/2014 s/p Ao-Bifem bypass.   Schizophrenia (HCC)    Seizures (HCC)    when I get anxious (Stated last seizure three months ago)   Tobacco abuse    Urinary frequency     Past Surgical History:  Procedure Laterality Date   ABDOMINAL AORTAGRAM N/A 10/22/2012   Procedure: ABDOMINAL EZELLA;  Surgeon: Deatrice DELENA Cage, MD;  Location: [facility] CATH LAB;  Service: Cardiovascular;  Laterality: N/A;   ABDOMINAL AORTAGRAM N/A 11/11/2014   Procedure: ABDOMINAL EZELLA;  Surgeon: Deatrice DELENA Cage, MD;  Location: [facility] CATH [facility];  Service: Cardiovascular;  Laterality: N/A;    ANTERIOR CERVICAL DECOMP/DISCECTOMY FUSION  01/2011   plate & screw placement    ANTERIOR CERVICAL DECOMP/DISCECTOMY FUSION N/A 01/26/2017   Procedure: ANTERIOR CERVICAL DECOMPRESSION/DISCECTOMY FUSION CERVICAL FOUR- CERVICAL FIVE;  Surgeon: Rockey Peru, MD;  Location: [facility] OR;  Service: Neurosurgery;  Laterality: N/A;  ANTERIOR CERVICAL DECOMPRESSION/DISCECTOMY FUSION CERVICAL FOUR- CERVICAL FIVE   AORTA - BILATERAL FEMORAL ARTERY BYPASS GRAFT N/A 11/20/2014   Procedure: AORTA BIFEMORAL BYPASS GRAFT;  Surgeon: Lynwood JONETTA Collum, MD;  Location: [facility] OR;  Service: Vascular;  Laterality: N/A;   BACK SURGERY     BRAIN SURGERY  1989   subdural hematoma   CARDIAC CATHETERIZATION     Stent - 12   CHOLECYSTECTOMY N/A 06/29/2017   Procedure: LAPAROSCOPIC CHOLECYSTECTOMY WITH INTRAOPERATIVE CHOLANGIOGRAM, POSSIBLE OPEN;  Surgeon: Dessa Reyes ORN, MD;  Location: [facility] ORS;  Service: General;  Laterality: N/A;   CORONARY ANGIOPLASTY WITH STENT PLACEMENT  03/2007   to RCA   CORONARY/GRAFT ACUTE MI REVASCULARIZATION N/A 03/07/2018   Procedure: Coronary/Graft Acute MI Revascularization;  Surgeon: Ammon Blunt, MD;  Location: [facility] INVASIVE CV LAB;  Service: Cardiovascular;  Laterality: N/A;   HERNIA REPAIR     ILIAC ARTERY STENT  10/22/2012   2 (10/22/2012)   ILIAC ARTERY STENT  06/2012   left   INCISIONAL HERNIA REPAIR N/A 11/08/2015   Procedure: INCISIONAL HERNIA REPAIR WITH MYOFASCIAL RELEASE;  Surgeon: Donnice Bury, MD;  Location: [facility] OR;  Service: General;  Laterality: N/A;   INSERTION OF MESH N/A 11/08/2015   Procedure: INSERTION OF MESH;  Surgeon: Donnice Bury, MD;  Location: [facility] OR;  Service: General;  Laterality: N/A;   LEFT HEART CATH AND CORONARY ANGIOGRAPHY N/A 03/07/2018   Procedure: LEFT HEART CATH AND CORONARY ANGIOGRAPHY;  Surgeon: Ammon Blunt, MD;  Location: [facility] INVASIVE CV LAB;  Service: Cardiovascular;  Laterality: N/A;   LEFT HEART CATHETERIZATION WITH CORONARY  ANGIOGRAM N/A 05/29/2012   Procedure: LEFT HEART CATHETERIZATION WITH CORONARY ANGIOGRAM;  Surgeon: Deatrice DELENA Cage, MD;  Location: [facility];  Service: Cardiovascular;  Laterality: N/A;   LIVER BIOPSY  10/2012   LOWER EXTREMITY ANGIOGRAM N/A 05/29/2012   Procedure: LOWER EXTREMITY ANGIOGRAM;  Surgeon: Deatrice DELENA Cage, MD;  Location: [facility] CATH [facility];  Service: Cardiovascular;  Laterality: N/A;   MICROLARYNGOSCOPY WITH CO2 LASER AND EXCISION OF VOCAL CORD LESION N/A 11/21/2017   Procedure: MICROLARYNGOSCOPY WITH EXCISION OF VOCAL CORD LESION;  Surgeon: Mable Lenis, MD;  Location: [facility] OR;  Service: ENT;  Laterality: N/A;   PERCUTANEOUS STENT INTERVENTION Left 05/29/2012   Procedure: PERCUTANEOUS STENT INTERVENTION;  Surgeon: Deatrice DELENA Cage, MD;  Location: [facility] CATH [facility];  Service: Cardiovascular;  Laterality: Left;   POSTERIOR FUSION CERVICAL SPINE  2012   SUBDURAL HEMATOMA EVACUATION VIA CRANIOTOMY      Prior to Admission medications  Medication Sig Start Date End Date Taking? Authorizing Provider  nitroGLYCERIN  (NITROSTAT ) 0.4 MG SL tablet Place 1 tablet (0.4 mg total) under the tongue every 5 (five) minutes as needed for chest pain. Maximum of 3 doses. 01/21/21  Yes Vivienne Lonni Ingle, NP  pantoprazole  (PROTONIX ) 40 MG tablet Take 1 tablet (40 mg total) by mouth daily at 12 noon. 08/28/19  Yes Clapacs, Norleen DASEN, MD  aspirin  EC 81 MG EC tablet Take 1 tablet (81 mg total) by mouth daily. Patient not taking: Reported on 01/01/2025 08/29/19   Clapacs, Norleen DASEN, MD  atorvastatin  (LIPITOR) 40 MG tablet Take 1 tablet (40 mg total) by mouth daily at 6 PM. 01/21/21 01/16/22  Vivienne Lonni Ingle, NP  cariprazine  4.5 MG CAPS Take 1 capsule (4.5 mg total) by mouth daily. Patient not taking: Reported on 01/01/2025 08/28/19   Clapacs, Norleen DASEN, MD  carvedilol  (COREG ) 6.25 MG tablet Take 1 tablet (6.25 mg total) by mouth 2 (two) times daily with a meal. 01/21/21 01/16/22  Vivienne Lonni Ingle, NP   divalproex  (DEPAKOTE  ER) 500 MG 24 hr tablet Take 500 mg by mouth in the morning and at bedtime. Patient not taking: Reported on 01/01/2025    [provider]  lithium  carbonate (ESKALITH ) 450 MG CR tablet Take 1 tablet (450 mg total) by mouth every 12 (twelve) hours. Patient not taking: Reported on 01/01/2025 08/28/19   Clapacs, Norleen DASEN, MD  valsartan  (DIOVAN ) 40 MG tablet Take 1 tablet (40 mg total) by mouth daily. 01/21/21 01/16/22  Vivienne Lonni Ingle, NP    Family History  Problem Relation Age of Onset   Emphysema Father    COPD Mother    Diabetes Other    Alcohol abuse Other    Hypertension Other    Hyperlipidemia Other    Seizures Other      Social History[1]  Allergies as of 01/01/2025 - Review Complete 01/01/2025  Allergen Reaction Noted   Lisinopril  Other (See Comments) 08/31/2015    Review of Systems:    All systems reviewed and negative except where noted in HPI.   Physical Exam:  Vital signs in last 24 hours: Temp:  [97.6 F (36.4 C)-99 F (37.2 C)] 99 F (37.2 C) (01/30 0737) Pulse Rate:  [51-98] 51 (01/30 0737) Resp:  [18-19] 18 (01/30 0737) BP: (130-155)/(69-99) 132/77 (01/30 0737) SpO2:  [98 %-100 %] 100 % (01/30 0737) Weight:  [53.7 kg-59 kg] 53.7 kg (01/29 2233) Last BM Date : 12/31/24 General:   Pleasant, cooperative in NAD Head:  Normocephalic and atraumatic. Eyes:   No icterus.   Conjunctiva pink. PERRLA. Ears:  Normal auditory acuity. Neck: Tracheostomy in place Lungs: Respirations even and unlabored. Lungs clear to auscultation bilaterally.   No wheezes, crackles, or rhonchi.  Heart:  Regular rate and rhythm;  Without murmur, clicks, rubs or gallops Abdomen:  Soft, nondistended, nontender. Normal bowel sounds. No appreciable masses or hepatomegaly.  No rebound or guarding.  PEG tube in place Rectal:  Not performed. Msk:  Symmetrical without gross deformities.  Extremities:  Without edema, cyanosis or clubbing. Neurologic:  Alert  and oriented x3;  grossly normal neurologically. Skin:  Intact without significant lesions or rashes. Cervical Nodes:  No significant cervical adenopathy. Psych:  Alert and cooperative. Normal affect.  LAB RESULTS: Recent Labs    01/01/25 1755 01/02/25 0540  WBC 7.7 7.1  HGB 15.3 13.6  HCT 46.0 41.1  PLT 108* 98*   BMET Recent Labs    01/01/25 1755  01/02/25 0540  NA 143 145  K 5.3* 4.3  CL 101 104  CO2 27 29  GLUCOSE 90 75  BUN 22 23  CREATININE 0.77 0.76  CALCIUM  10.6* 9.9   LFT Recent Labs    01/02/25 0540  PROT 7.4  ALBUMIN  4.5  AST 65*  ALT 86*  ALKPHOS 145*  BILITOT 1.2  BILIDIR 0.5*  IBILI 0.7   PT/INR No results for input(s): LABPROT, INR in the last 72 hours.  STUDIES: CT ABDOMEN PELVIS W CONTRAST Result Date: 01/01/2025 EXAM: CT ABDOMEN AND PELVIS WITH CONTRAST 01/01/2025 08:01:13 PM TECHNIQUE: CT of the abdomen and pelvis was performed with the administration of 100 mL of iohexol  (OMNIPAQUE ) 300 MG/ML solution. Multiplanar reformatted images are provided for review. Automated exposure control, iterative reconstruction, and/or weight-based adjustment of the mA/kV was utilized to reduce the radiation dose to as low as reasonably achievable. COMPARISON: None available. CLINICAL HISTORY: Acute abdominal pain and gastrostomy tube displacement. FINDINGS: LOWER CHEST: No acute abnormality. LIVER: Scattered small cysts are noted within the liver. GALLBLADDER AND BILE DUCTS: The gallbladder has been surgically removed. The common bile duct is prominent, consistent with the post-cholecystectomy state. A small calculus is noted, however, in the distal aspect of the common bile duct. This measures approximately 5 mm in greatest dimension. No intrahepatic ductal dilatation is identified to suggest obstruction. SPLEEN: No acute abnormality. PANCREAS: No acute abnormality. ADRENAL GLANDS: No acute abnormality. KIDNEYS, URETERS AND BLADDER: Kidneys show renal cystic  change, particularly on the right. No follow-up examination is recommended. No calculi or obstructive changes are noted. The bladder is well distended. No perinephric or periureteral stranding. GI AND BOWEL: The gastrostomy catheter extends into the stomach but also through the pyloric channel into the proximal duodenum. This should be withdrawn several centimeters. Small bowel is within normal limits. No obstructive or inflammatory changes of the colon are seen. The appendix is well visualized with inspissated barium. There is no bowel obstruction. PERITONEUM AND RETROPERITONEUM: No ascites. No free air. VASCULATURE: Atherosclerotic calcifications of the aorta are noted. Changes consistent with aortobifemoral bypass grafting are noted. The graft is widely patent. Normal runoff distally is seen. LYMPH NODES: No lymphadenopathy. REPRODUCTIVE ORGANS: The prostate is within normal limits. BONES AND SOFT TISSUES: Bilateral pars defects are noted at L5 without significant anterolisthesis. No focal soft tissue abnormality. IMPRESSION: 1. Gastrostomy catheter extends into the stomach and through the pyloric channel into the proximal duodenum, and should be withdrawn several centimeters. 2. Small calculus in the distal aspect of the common bile duct, measuring approximately 5 mm, without intrahepatic ductal dilatation. Electronically signed by: Oneil Devonshire MD 01/01/2025 08:09 PM EST RP Workstation: [id number]   DG ABDOMEN PEG TUBE LOCATION Result Date: 12/31/2024 CLINICAL DATA:  G-tube check. EXAM: ABDOMEN - 1 VIEW COMPARISON:  Abdominal radiograph dated 09/23/2023. FINDINGS: Percutaneous gastrostomy over the body of the stomach. Contrast injected through the tube opacifies the gastric fundus. No extraluminal contrast to suggest a leak. IMPRESSION: Percutaneous gastrostomy tube in the stomach. Electronically Signed   By: Vanetta Chou M.D.   On: 12/31/2024 15:25      Impression / Plan:   Assessment: Principal  Problem:   Choledocholithiasis Active Problems:   Bipolar disorder, currently in remission   Dyslipidemia   GERD without esophagitis   Essential hypertension   KELVEN FLATER is a 64 y.o. y/o male with a history of a common bile duct stone measuring 5 mm back in 2024 at [facility]  The patient  went for a ERCP at that time and the scope could not be passed due to a stricture and known tracheoesophageal fistula.  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 The patient's common bile duct stone was known back in 2024 and an EGD/ERCP was unable to be accomplished at that time due to esophagus fistula and narrowing.  I do not plan on pursuing a ERCP on this patient at this time.  With the patient's liver enzymes being slightly up the stone may have become lodged at the ampulla.  Options include repeating LFTs and if increasing transfer to [facility] for a possible repeat attempt at an ERCP versus outpatient follow-up with them if the liver enzymes do not increased..  Thank you for involving me in the care of this patient.      LOS: 0 days   Philip Copping, MD, MD. NOLIA 01/02/2025, 10:21 AM,  Pager [phone] 7am-5pm  Check AMION for 5pm -7am coverage and on weekends   Note: This dictation was prepared with Dragon dictation along with smaller phrase technology. Any transcriptional errors that result from this process are unintentional.       [1]  Social History Tobacco Use   Smoking status: Every Day    Current packs/day: 1.00    Average packs/day: 1 pack/day for 40.0 years (40.0 ttl pk-yrs)    Types: Cigarettes   Smokeless tobacco: Never   Tobacco comments:    ive slowed down  recently   Vaping Use   Vaping status: Former  Substance Use Topics   Alcohol use: Yes    Alcohol/week: 0.0 standard drinks of alcohol    Comment: 1/2 gallon or 20 beers daily   Drug use: Yes    Types: Marijuana    Comment: last smoked 1 month ago   "

## 2025-01-02 NOTE — Assessment & Plan Note (Signed)
-   Will continue antihypertensive therapy.

## 2025-01-02 NOTE — Assessment & Plan Note (Signed)
-   Will resume PPI therapy.

## 2025-01-02 NOTE — Assessment & Plan Note (Signed)
-   Will continue Vraylar  and Depakote  ER as well as lithium

## 2025-01-02 NOTE — Plan of Care (Signed)
